# Patient Record
Sex: Female | Born: 1960 | Race: White | Hispanic: No | Marital: Married | Smoking: Never smoker
Health system: Southern US, Community
[De-identification: ages and names within clinical notes are randomized; demographics above are authoritative.]

## PROBLEM LIST (undated history)

## (undated) DIAGNOSIS — R52 Pain, unspecified: Secondary | ICD-10-CM

## (undated) DIAGNOSIS — M4800 Spinal stenosis, site unspecified: Secondary | ICD-10-CM

## (undated) DIAGNOSIS — Z8719 Personal history of other diseases of the digestive system: Secondary | ICD-10-CM

## (undated) DIAGNOSIS — G43909 Migraine, unspecified, not intractable, without status migrainosus: Secondary | ICD-10-CM

## (undated) DIAGNOSIS — F32A Depression, unspecified: Secondary | ICD-10-CM

## (undated) DIAGNOSIS — M797 Fibromyalgia: Secondary | ICD-10-CM

## (undated) DIAGNOSIS — M47812 Spondylosis without myelopathy or radiculopathy, cervical region: Secondary | ICD-10-CM

## (undated) DIAGNOSIS — K760 Fatty (change of) liver, not elsewhere classified: Secondary | ICD-10-CM

## (undated) DIAGNOSIS — F419 Anxiety disorder, unspecified: Secondary | ICD-10-CM

## (undated) DIAGNOSIS — K219 Gastro-esophageal reflux disease without esophagitis: Secondary | ICD-10-CM

## (undated) DIAGNOSIS — K2270 Barrett's esophagus without dysplasia: Secondary | ICD-10-CM

## (undated) DIAGNOSIS — E7800 Pure hypercholesterolemia, unspecified: Secondary | ICD-10-CM

## (undated) DIAGNOSIS — N63 Unspecified lump in unspecified breast: Secondary | ICD-10-CM

## (undated) DIAGNOSIS — R42 Dizziness and giddiness: Secondary | ICD-10-CM

## (undated) DIAGNOSIS — F909 Attention-deficit hyperactivity disorder, unspecified type: Secondary | ICD-10-CM

## (undated) DIAGNOSIS — E079 Disorder of thyroid, unspecified: Secondary | ICD-10-CM

## (undated) DIAGNOSIS — D649 Anemia, unspecified: Secondary | ICD-10-CM

## (undated) DIAGNOSIS — T7840XA Allergy, unspecified, initial encounter: Secondary | ICD-10-CM

## (undated) DIAGNOSIS — K449 Diaphragmatic hernia without obstruction or gangrene: Secondary | ICD-10-CM

## (undated) DIAGNOSIS — Z87440 Personal history of urinary (tract) infections: Secondary | ICD-10-CM

## (undated) DIAGNOSIS — F410 Panic disorder [episodic paroxysmal anxiety] without agoraphobia: Secondary | ICD-10-CM

## (undated) DIAGNOSIS — R51 Headache: Secondary | ICD-10-CM

## (undated) DIAGNOSIS — K52832 Lymphocytic colitis: Secondary | ICD-10-CM

## (undated) DIAGNOSIS — R748 Abnormal levels of other serum enzymes: Secondary | ICD-10-CM

## (undated) DIAGNOSIS — F329 Major depressive disorder, single episode, unspecified: Secondary | ICD-10-CM

## (undated) HISTORY — DX: Anemia, unspecified: D64.9

## (undated) HISTORY — DX: Personal history of urinary (tract) infections: Z87.440

## (undated) HISTORY — DX: Anxiety disorder, unspecified: F41.9

## (undated) HISTORY — DX: Lymphocytic colitis: K52.832

## (undated) HISTORY — DX: Unspecified lump in unspecified breast: N63.0

## (undated) HISTORY — DX: Major depressive disorder, single episode, unspecified: F32.9

## (undated) HISTORY — DX: Pure hypercholesterolemia, unspecified: E78.00

## (undated) HISTORY — DX: Fatty (change of) liver, not elsewhere classified: K76.0

## (undated) HISTORY — PX: CHOLECYSTECTOMY: SHX55

## (undated) HISTORY — DX: Attention-deficit hyperactivity disorder, unspecified type: F90.9

## (undated) HISTORY — DX: Migraine, unspecified, not intractable, without status migrainosus: G43.909

## (undated) HISTORY — DX: Allergy, unspecified, initial encounter: T78.40XA

## (undated) HISTORY — DX: Gastro-esophageal reflux disease without esophagitis: K21.9

## (undated) HISTORY — PX: ANKLE SURGERY: SHX546

## (undated) HISTORY — DX: Disorder of thyroid, unspecified: E07.9

## (undated) HISTORY — DX: Abnormal levels of other serum enzymes: R74.8

## (undated) HISTORY — PX: TUBAL LIGATION: SHX77

## (undated) HISTORY — DX: Pain, unspecified: R52

## (undated) HISTORY — DX: Depression, unspecified: F32.A

## (undated) HISTORY — DX: Dizziness and giddiness: R42

---

## 1999-03-02 ENCOUNTER — Other Ambulatory Visit: Admission: RE | Admit: 1999-03-02 | Discharge: 1999-03-02 | Payer: Self-pay | Admitting: Family Medicine

## 1999-06-03 ENCOUNTER — Encounter: Admission: RE | Admit: 1999-06-03 | Discharge: 1999-06-03 | Payer: Self-pay | Admitting: Family Medicine

## 1999-06-03 ENCOUNTER — Encounter: Payer: Self-pay | Admitting: Family Medicine

## 1999-07-27 ENCOUNTER — Emergency Department (HOSPITAL_COMMUNITY): Admission: EM | Admit: 1999-07-27 | Discharge: 1999-07-28 | Payer: Self-pay | Admitting: Emergency Medicine

## 1999-07-28 ENCOUNTER — Encounter: Payer: Self-pay | Admitting: Emergency Medicine

## 2000-04-09 ENCOUNTER — Encounter: Admission: RE | Admit: 2000-04-09 | Discharge: 2000-04-09 | Payer: Self-pay | Admitting: Family Medicine

## 2000-04-09 ENCOUNTER — Encounter: Payer: Self-pay | Admitting: Family Medicine

## 2000-04-27 ENCOUNTER — Encounter: Payer: Self-pay | Admitting: Emergency Medicine

## 2000-04-27 ENCOUNTER — Emergency Department (HOSPITAL_COMMUNITY): Admission: EM | Admit: 2000-04-27 | Discharge: 2000-04-27 | Payer: Self-pay | Admitting: Emergency Medicine

## 2000-05-02 ENCOUNTER — Emergency Department (HOSPITAL_COMMUNITY): Admission: EM | Admit: 2000-05-02 | Discharge: 2000-05-02 | Payer: Self-pay | Admitting: Emergency Medicine

## 2000-05-02 ENCOUNTER — Encounter: Payer: Self-pay | Admitting: Emergency Medicine

## 2000-05-30 ENCOUNTER — Encounter: Admission: RE | Admit: 2000-05-30 | Discharge: 2000-06-27 | Payer: Self-pay | Admitting: Neurology

## 2000-07-16 ENCOUNTER — Other Ambulatory Visit: Admission: RE | Admit: 2000-07-16 | Discharge: 2000-07-16 | Payer: Self-pay | Admitting: Gynecology

## 2001-03-04 ENCOUNTER — Encounter (INDEPENDENT_AMBULATORY_CARE_PROVIDER_SITE_OTHER): Payer: Self-pay | Admitting: Specialist

## 2001-03-04 ENCOUNTER — Inpatient Hospital Stay (HOSPITAL_COMMUNITY): Admission: RE | Admit: 2001-03-04 | Discharge: 2001-03-06 | Payer: Self-pay | Admitting: Gynecology

## 2001-03-04 HISTORY — PX: VAGINAL HYSTERECTOMY: SUR661

## 2002-02-20 ENCOUNTER — Ambulatory Visit (HOSPITAL_BASED_OUTPATIENT_CLINIC_OR_DEPARTMENT_OTHER): Admission: RE | Admit: 2002-02-20 | Discharge: 2002-02-20 | Payer: Self-pay | Admitting: Orthopedic Surgery

## 2002-04-10 ENCOUNTER — Emergency Department (HOSPITAL_COMMUNITY): Admission: EM | Admit: 2002-04-10 | Discharge: 2002-04-10 | Payer: Self-pay | Admitting: Emergency Medicine

## 2002-05-06 ENCOUNTER — Encounter: Admission: RE | Admit: 2002-05-06 | Discharge: 2002-05-06 | Payer: Self-pay | Admitting: Family Medicine

## 2002-05-06 ENCOUNTER — Encounter: Payer: Self-pay | Admitting: Family Medicine

## 2004-02-15 ENCOUNTER — Inpatient Hospital Stay (HOSPITAL_COMMUNITY): Admission: EM | Admit: 2004-02-15 | Discharge: 2004-02-16 | Payer: Self-pay | Admitting: Internal Medicine

## 2004-02-16 ENCOUNTER — Ambulatory Visit: Payer: Self-pay | Admitting: Psychiatry

## 2004-02-16 ENCOUNTER — Inpatient Hospital Stay (HOSPITAL_COMMUNITY): Admission: RE | Admit: 2004-02-16 | Discharge: 2004-02-21 | Payer: Self-pay | Admitting: Psychiatry

## 2004-04-11 ENCOUNTER — Encounter
Admission: RE | Admit: 2004-04-11 | Discharge: 2004-07-06 | Payer: Self-pay | Admitting: Physical Medicine & Rehabilitation

## 2004-05-10 ENCOUNTER — Ambulatory Visit: Payer: Self-pay | Admitting: Physical Medicine & Rehabilitation

## 2004-05-13 ENCOUNTER — Encounter
Admission: RE | Admit: 2004-05-13 | Discharge: 2004-07-06 | Payer: Self-pay | Admitting: Physical Medicine & Rehabilitation

## 2004-06-28 ENCOUNTER — Ambulatory Visit: Payer: Self-pay | Admitting: Physical Medicine & Rehabilitation

## 2004-07-07 ENCOUNTER — Ambulatory Visit (HOSPITAL_COMMUNITY)
Admission: RE | Admit: 2004-07-07 | Discharge: 2004-07-07 | Payer: Self-pay | Admitting: Physical Medicine & Rehabilitation

## 2004-07-21 ENCOUNTER — Encounter
Admission: RE | Admit: 2004-07-21 | Discharge: 2004-10-19 | Payer: Self-pay | Admitting: Physical Medicine & Rehabilitation

## 2004-07-26 ENCOUNTER — Encounter: Admission: RE | Admit: 2004-07-26 | Discharge: 2004-08-11 | Payer: Self-pay | Admitting: Orthopedic Surgery

## 2004-09-17 ENCOUNTER — Emergency Department (HOSPITAL_COMMUNITY): Admission: EM | Admit: 2004-09-17 | Discharge: 2004-09-17 | Payer: Self-pay | Admitting: Emergency Medicine

## 2004-11-10 ENCOUNTER — Ambulatory Visit: Payer: Self-pay | Admitting: Physical Medicine & Rehabilitation

## 2004-11-10 ENCOUNTER — Encounter
Admission: RE | Admit: 2004-11-10 | Discharge: 2005-02-08 | Payer: Self-pay | Admitting: Physical Medicine & Rehabilitation

## 2004-11-17 ENCOUNTER — Encounter
Admission: RE | Admit: 2004-11-17 | Discharge: 2004-12-09 | Payer: Self-pay | Admitting: Physical Medicine & Rehabilitation

## 2005-02-13 ENCOUNTER — Ambulatory Visit: Payer: Self-pay | Admitting: Family Medicine

## 2005-03-01 ENCOUNTER — Ambulatory Visit (HOSPITAL_COMMUNITY): Admission: RE | Admit: 2005-03-01 | Discharge: 2005-03-01 | Payer: Self-pay | Admitting: Family Medicine

## 2005-03-07 ENCOUNTER — Ambulatory Visit: Payer: Self-pay | Admitting: *Deleted

## 2005-03-13 ENCOUNTER — Ambulatory Visit: Payer: Self-pay | Admitting: Family Medicine

## 2006-02-21 ENCOUNTER — Emergency Department (HOSPITAL_COMMUNITY): Admission: EM | Admit: 2006-02-21 | Discharge: 2006-02-22 | Payer: Self-pay | Admitting: Emergency Medicine

## 2006-02-24 ENCOUNTER — Emergency Department (HOSPITAL_COMMUNITY): Admission: EM | Admit: 2006-02-24 | Discharge: 2006-02-24 | Payer: Self-pay | Admitting: *Deleted

## 2007-07-06 ENCOUNTER — Emergency Department (HOSPITAL_COMMUNITY): Admission: EM | Admit: 2007-07-06 | Discharge: 2007-07-06 | Payer: Self-pay | Admitting: Emergency Medicine

## 2007-07-07 ENCOUNTER — Inpatient Hospital Stay (HOSPITAL_COMMUNITY): Admission: EM | Admit: 2007-07-07 | Discharge: 2007-07-11 | Payer: Self-pay | Admitting: Emergency Medicine

## 2007-11-01 ENCOUNTER — Encounter: Admission: RE | Admit: 2007-11-01 | Discharge: 2007-11-01 | Payer: Self-pay | Admitting: Gastroenterology

## 2007-11-04 ENCOUNTER — Ambulatory Visit (HOSPITAL_COMMUNITY): Admission: RE | Admit: 2007-11-04 | Discharge: 2007-11-04 | Payer: Self-pay | Admitting: Gastroenterology

## 2008-01-15 ENCOUNTER — Ambulatory Visit (HOSPITAL_COMMUNITY): Admission: RE | Admit: 2008-01-15 | Discharge: 2008-01-15 | Payer: Self-pay | Admitting: Surgery

## 2008-01-17 ENCOUNTER — Encounter: Admission: RE | Admit: 2008-01-17 | Discharge: 2008-01-17 | Payer: Self-pay | Admitting: Gastroenterology

## 2008-03-05 ENCOUNTER — Ambulatory Visit (HOSPITAL_COMMUNITY): Admission: RE | Admit: 2008-03-05 | Discharge: 2008-03-08 | Payer: Self-pay | Admitting: Surgery

## 2008-03-05 HISTORY — PX: LAPAROSCOPIC NISSEN FUNDOPLICATION: SHX1932

## 2008-03-05 HISTORY — PX: LAPAROSCOPIC PARAESOPHAGEAL HERNIA REPAIR: SHX6307

## 2008-04-24 HISTORY — PX: COLONOSCOPY: SHX174

## 2008-10-07 ENCOUNTER — Other Ambulatory Visit: Admission: RE | Admit: 2008-10-07 | Discharge: 2008-10-07 | Payer: Self-pay | Admitting: Family Medicine

## 2008-10-14 ENCOUNTER — Encounter: Admission: RE | Admit: 2008-10-14 | Discharge: 2008-10-14 | Payer: Self-pay | Admitting: Family Medicine

## 2008-10-27 ENCOUNTER — Encounter: Admission: RE | Admit: 2008-10-27 | Discharge: 2008-10-27 | Payer: Self-pay | Admitting: Family Medicine

## 2008-11-04 ENCOUNTER — Encounter: Admission: RE | Admit: 2008-11-04 | Discharge: 2008-11-04 | Payer: Self-pay | Admitting: Family Medicine

## 2008-12-25 ENCOUNTER — Emergency Department (HOSPITAL_COMMUNITY): Admission: EM | Admit: 2008-12-25 | Discharge: 2008-12-25 | Payer: Self-pay | Admitting: Emergency Medicine

## 2009-01-22 LAB — HM COLONOSCOPY

## 2009-07-16 ENCOUNTER — Emergency Department (HOSPITAL_COMMUNITY): Admission: EM | Admit: 2009-07-16 | Discharge: 2009-07-16 | Payer: Self-pay | Admitting: Emergency Medicine

## 2009-09-29 ENCOUNTER — Encounter (INDEPENDENT_AMBULATORY_CARE_PROVIDER_SITE_OTHER): Payer: Self-pay | Admitting: Internal Medicine

## 2009-09-29 ENCOUNTER — Observation Stay (HOSPITAL_COMMUNITY): Admission: EM | Admit: 2009-09-29 | Discharge: 2009-09-30 | Payer: Self-pay | Admitting: Emergency Medicine

## 2009-09-29 ENCOUNTER — Ambulatory Visit: Payer: Self-pay | Admitting: Surgery

## 2009-09-30 ENCOUNTER — Ambulatory Visit: Payer: Self-pay | Admitting: Surgery

## 2009-09-30 ENCOUNTER — Encounter (INDEPENDENT_AMBULATORY_CARE_PROVIDER_SITE_OTHER): Payer: Self-pay | Admitting: Internal Medicine

## 2009-12-11 ENCOUNTER — Other Ambulatory Visit: Payer: Self-pay | Admitting: Emergency Medicine

## 2009-12-12 ENCOUNTER — Inpatient Hospital Stay (HOSPITAL_COMMUNITY): Admission: RE | Admit: 2009-12-12 | Discharge: 2009-12-17 | Payer: Self-pay | Admitting: Psychiatry

## 2009-12-12 ENCOUNTER — Ambulatory Visit: Payer: Self-pay | Admitting: Psychiatry

## 2010-04-13 ENCOUNTER — Ambulatory Visit (HOSPITAL_COMMUNITY)
Admission: RE | Admit: 2010-04-13 | Discharge: 2010-04-13 | Payer: Self-pay | Source: Home / Self Care | Attending: Family Medicine | Admitting: Family Medicine

## 2010-05-15 ENCOUNTER — Encounter: Payer: Self-pay | Admitting: Family Medicine

## 2010-05-16 ENCOUNTER — Encounter: Payer: Self-pay | Admitting: Family Medicine

## 2010-06-27 ENCOUNTER — Emergency Department (HOSPITAL_COMMUNITY)
Admission: EM | Admit: 2010-06-27 | Discharge: 2010-06-28 | Disposition: A | Discharge status: Home / Self Care | Payer: Medicare Other | Attending: Emergency Medicine | Admitting: Emergency Medicine

## 2010-06-27 DIAGNOSIS — R110 Nausea: Secondary | ICD-10-CM | POA: Insufficient documentation

## 2010-06-27 DIAGNOSIS — R197 Diarrhea, unspecified: Secondary | ICD-10-CM | POA: Insufficient documentation

## 2010-06-27 DIAGNOSIS — Z9089 Acquired absence of other organs: Secondary | ICD-10-CM | POA: Insufficient documentation

## 2010-06-27 DIAGNOSIS — K7689 Other specified diseases of liver: Secondary | ICD-10-CM | POA: Insufficient documentation

## 2010-06-27 DIAGNOSIS — R109 Unspecified abdominal pain: Secondary | ICD-10-CM | POA: Insufficient documentation

## 2010-06-27 LAB — POCT I-STAT, CHEM 8
BUN: 11 mg/dL (ref 6–23)
Calcium, Ion: 1.15 mmol/L (ref 1.12–1.32)
Chloride: 110 mEq/L (ref 96–112)
HCT: 42 % (ref 36.0–46.0)
Potassium: 3.6 mEq/L (ref 3.5–5.1)
Sodium: 140 mEq/L (ref 135–145)

## 2010-06-28 ENCOUNTER — Encounter (HOSPITAL_COMMUNITY): Payer: Self-pay

## 2010-06-28 ENCOUNTER — Emergency Department (HOSPITAL_COMMUNITY): Payer: Medicare Other

## 2010-06-28 LAB — DIFFERENTIAL
Basophils Relative: 0 % (ref 0–1)
Eosinophils Relative: 1 % (ref 0–5)
Monocytes Relative: 10 % (ref 3–12)
Neutrophils Relative %: 63 % (ref 43–77)

## 2010-06-28 LAB — CBC
HCT: 42.5 % (ref 36.0–46.0)
MCH: 31.2 pg (ref 26.0–34.0)
MCV: 92 fL (ref 78.0–100.0)
RBC: 4.62 MIL/uL (ref 3.87–5.11)
WBC: 10.4 10*3/uL (ref 4.0–10.5)

## 2010-06-28 LAB — URINALYSIS, ROUTINE W REFLEX MICROSCOPIC
Glucose, UA: NEGATIVE mg/dL
Ketones, ur: NEGATIVE mg/dL
Leukocytes, UA: NEGATIVE
Nitrite: POSITIVE — AB
Protein, ur: NEGATIVE mg/dL
pH: 5 (ref 5.0–8.0)

## 2010-06-28 LAB — URINE MICROSCOPIC-ADD ON

## 2010-06-28 MED ORDER — IOHEXOL 300 MG/ML  SOLN
100.0000 mL | Freq: Once | INTRAMUSCULAR | Status: AC | PRN
  Administered 2010-06-28: 100 mL via INTRAVENOUS

## 2010-07-07 LAB — DIFFERENTIAL
Basophils Absolute: 0 K/uL (ref 0.0–0.1)
Basophils Relative: 1 % (ref 0–1)
Eosinophils Absolute: 0.1 K/uL (ref 0.0–0.7)
Eosinophils Relative: 2 % (ref 0–5)
Lymphocytes Relative: 28 % (ref 12–46)
Lymphs Abs: 2 K/uL (ref 0.7–4.0)
Monocytes Absolute: 0.5 K/uL (ref 0.1–1.0)
Monocytes Relative: 8 % (ref 3–12)
Neutro Abs: 4.4 K/uL (ref 1.7–7.7)
Neutrophils Relative %: 62 % (ref 43–77)

## 2010-07-07 LAB — COMPREHENSIVE METABOLIC PANEL
Albumin: 3.7 g/dL (ref 3.5–5.2)
Alkaline Phosphatase: 80 U/L (ref 39–117)
BUN: 9 mg/dL (ref 6–23)
CO2: 26 mEq/L (ref 19–32)
Chloride: 104 mEq/L (ref 96–112)
Potassium: 4.5 mEq/L (ref 3.5–5.1)
Total Bilirubin: 0.6 mg/dL (ref 0.3–1.2)

## 2010-07-07 LAB — CBC
HCT: 39.6 % (ref 36.0–46.0)
Hemoglobin: 13.8 g/dL (ref 12.0–15.0)
MCV: 93 fL (ref 78.0–100.0)
Platelets: 198 10*3/uL (ref 150–400)
RBC: 4.26 MIL/uL (ref 3.87–5.11)
WBC: 7 10*3/uL (ref 4.0–10.5)

## 2010-07-07 LAB — POCT PREGNANCY, URINE: Preg Test, Ur: NEGATIVE

## 2010-07-07 LAB — URINALYSIS, ROUTINE W REFLEX MICROSCOPIC
Bilirubin Urine: NEGATIVE
Glucose, UA: NEGATIVE mg/dL
Hgb urine dipstick: NEGATIVE
Ketones, ur: NEGATIVE mg/dL
pH: 6.5 (ref 5.0–8.0)

## 2010-07-07 LAB — RAPID URINE DRUG SCREEN, HOSP PERFORMED
Barbiturates: NOT DETECTED
Benzodiazepines: POSITIVE — AB
Cocaine: NOT DETECTED
Opiates: POSITIVE — AB

## 2010-07-07 LAB — GLUCOSE, CAPILLARY

## 2010-07-07 LAB — TSH: TSH: 2.125 u[IU]/mL (ref 0.350–4.500)

## 2010-07-11 LAB — POCT I-STAT, CHEM 8
Creatinine, Ser: 0.9 mg/dL (ref 0.4–1.2)
HCT: 40 % (ref 36.0–46.0)
Hemoglobin: 13.6 g/dL (ref 12.0–15.0)
Potassium: 3.8 mEq/L (ref 3.5–5.1)
Sodium: 138 mEq/L (ref 135–145)
TCO2: 24 mmol/L (ref 0–100)

## 2010-07-11 LAB — CBC
HCT: 36.9 % (ref 36.0–46.0)
HCT: 38.6 % (ref 36.0–46.0)
Hemoglobin: 12.5 g/dL (ref 12.0–15.0)
Hemoglobin: 12.9 g/dL (ref 12.0–15.0)
MCHC: 33.4 g/dL (ref 30.0–36.0)
MCHC: 33.8 g/dL (ref 30.0–36.0)
MCV: 95.6 fL (ref 78.0–100.0)
RBC: 3.86 MIL/uL — ABNORMAL LOW (ref 3.87–5.11)
RDW: 13.8 % (ref 11.5–15.5)
RDW: 14.1 % (ref 11.5–15.5)

## 2010-07-11 LAB — POCT CARDIAC MARKERS
CKMB, poc: 5.2 ng/mL (ref 1.0–8.0)
Myoglobin, poc: >500 ng/mL (ref 12–200)

## 2010-07-11 LAB — DIFFERENTIAL
Basophils Absolute: 0 10*3/uL (ref 0.0–0.1)
Basophils Absolute: 0.2 10*3/uL — ABNORMAL HIGH (ref 0.0–0.1)
Eosinophils Absolute: 0.2 10*3/uL (ref 0.0–0.7)
Eosinophils Relative: 3 % (ref 0–5)
Lymphocytes Relative: 24 % (ref 12–46)
Lymphocytes Relative: 45 % (ref 12–46)
Lymphs Abs: 2.6 10*3/uL (ref 0.7–4.0)
Monocytes Absolute: 0.6 10*3/uL (ref 0.1–1.0)
Neutro Abs: 7.7 10*3/uL (ref 1.7–7.7)
Neutrophils Relative %: 41 % — ABNORMAL LOW (ref 43–77)
Neutrophils Relative %: 69 % (ref 43–77)

## 2010-07-11 LAB — URINE CULTURE

## 2010-07-11 LAB — COMPREHENSIVE METABOLIC PANEL
BUN: 7 mg/dL (ref 6–23)
CO2: 19 mEq/L (ref 19–32)
Calcium: 8.3 mg/dL — ABNORMAL LOW (ref 8.4–10.5)
Chloride: 115 mEq/L — ABNORMAL HIGH (ref 96–112)
Creatinine, Ser: 0.81 mg/dL (ref 0.4–1.2)
GFR calc non Af Amer: >60 mL/min (ref >60)
Glucose, Bld: 96 mg/dL (ref 70–99)
Total Bilirubin: 0.7 mg/dL (ref 0.3–1.2)

## 2010-07-11 LAB — LIPID PANEL
Cholesterol: 150 mg/dL (ref 0–200)
HDL: 57 mg/dL (ref >39)
Total CHOL/HDL Ratio: 2.6 RATIO

## 2010-07-11 LAB — URINE MICROSCOPIC-ADD ON

## 2010-07-11 LAB — HEMOGLOBIN A1C
Hgb A1c MFr Bld: 5.4 % (ref <5.7)
Mean Plasma Glucose: 108 mg/dL (ref <117)

## 2010-07-11 LAB — PHOSPHORUS: Phosphorus: 3.1 mg/dL (ref 2.3–4.6)

## 2010-07-11 LAB — URINALYSIS, ROUTINE W REFLEX MICROSCOPIC
Bilirubin Urine: NEGATIVE
Hgb urine dipstick: NEGATIVE
Specific Gravity, Urine: 1.009 (ref 1.005–1.030)
pH: 7 (ref 5.0–8.0)

## 2010-07-11 LAB — PROTIME-INR: INR: 1.05 (ref 0.00–1.49)

## 2010-07-11 LAB — CK TOTAL AND CKMB (NOT AT ARMC): CK, MB: 7 ng/mL (ref 0.3–4.0)

## 2010-09-06 NOTE — Discharge Summary (Signed)
Leslie Benson, Leslie Benson            ACCOUNT NO.:  [id number]   MEDICAL RECORD NO.:  [id number]          PATIENT TYPE:  INP   LOCATION:  1515                         FACILITY:  [facility]   PHYSICIAN:  Hollice Espy, M.D.DATE OF BIRTH:  [birth year]   DATE OF ADMISSION:  07/07/2007  DATE OF DISCHARGE:  07/11/2007                               DISCHARGE SUMMARY   PRIMARY CARE PHYSICIAN:  Donia Guiles, M.D.   GI DOCTOR:  Bernette Redbird, M.D.   DISCHARGE DIAGNOSES:  1. Right lower lobe pneumonia, resolving.  2. Viral gastroenteritis.  3. Diarrhea secondary to number #2.  4. Anxiety.  5. Hypokalemia secondary to dehydration and diarrhea, now resolved.   DISCHARGE MEDICATIONS:  The patient will continue all of her previous  medications.  These are as follows:   1. Tramadol 50 mg p.o. p.r.n.  2. Seroquel 600 mg p.o. nightly.  3. Topamax 200 mg p.o. nightly.  4. Zocor 20 p.o. mg nightly.  5. Lyrica 150 mg p.o. t.i.d.  6. Nexium 40 mg p.o. daily.  7. Cymbalta 60 mg p.o. daily.  8. Xanax three times a day as needed.   New medications:  1. Avelox 400 mg p.o. x3 days.  2. Imodium 2 mg p.o. q.8 h. x3 days, then p.r.n.   HOSPITAL COURSE:  1. The patient is a 49 year old white female with a past medical      history of anxiety and depression who presented with several days      of weakness, diarrhea and shortness of breath.  In regards to her      pneumonia, she was found to have an elevated white count and an x-      ray showing interstitial patchy airways disease consistent with      pneumonia.  The patient was started on IV antibiotics, nebulizers      and oxygen and over the next several days showed improvement.  Her      chest x-ray on July 11, 2007, showed improvement but not yet      complete resolution of pneumonia.  Plan will be for the patient to      continue on Avelox x5 more days.  She will also be discharged on an      inspirometer to help increase lung capacity  function and further      resolution of pneumonia.  2. In regards to her anxiety, initially she did not list Xanax as one      of her p.r.n. medications and had a episodes of anxiety.  After      having her treated with p.r.n. Xanax, this resolved.  3. In regards to her diarrhea, stool studies were sent which were      negative for C difficile.  She was treated p.r.n. Imodium and had      resolution of this issue.  Plan will be continue her on t.i.d.      Imodium scheduled and until she has completed her antibiotics in      case some of this may be a side effect secondary to her      antibiotics.  4. In regard to her other medical issues including depression and      fibromyalgia, these were stable.  She was continued on her      medications.   OVERALL DISPOSITION:  Improved.   ACTIVITY:  Slow to increase.   DISCHARGE DIET:  A regular diet.   She will follow up with PCP, Dr. Donia Guiles, in the next 1-2 weeks,  and she is being discharged home.      Hollice Espy, M.D.  Electronically Signed     SKK/MEDQ  D:  07/11/2007  T:  07/12/2007  Job:  [id number]   cc:   Donia Guiles, M.D.  Fax: [phone]   Bernette Redbird, M.D.  Fax: [phone]

## 2010-09-06 NOTE — Op Note (Signed)
Leslie Benson, Leslie Benson            ACCOUNT NO.:  [id number]   MEDICAL RECORD NO.:  [id number]          PATIENT TYPE:  AMB   LOCATION:  ENDO                         FACILITY:  [facility]   PHYSICIAN:  Bernette Redbird, M.D.   DATE OF BIRTH:  Dec 02, 1960   DATE OF PROCEDURE:  11/04/2007  DATE OF DISCHARGE:  11/04/2007                               OPERATIVE REPORT   PROCEDURE:  Esophageal manometry.   INDICATION:  A 50 year old female with severe reflux and regurgitation  symptoms, being considered for possible antireflux surgery.   FINDINGS:  Normal exam, except slightly diminished lower esophageal  sphincter resting tone   DESCRIPTION OF PROCEDURE:  The patient provided written consent for the  procedure after coming as an outpatient to the [facility]  [facility].  The solid-state manometry catheter was passed transnasally, and  LES study was performed, and then wet swallows were administered for the  esophageal body and upper esophageal sphincter studies.   FINDINGS:  1. LES, lower esophageal sphincter had slightly reduced resting tone      of 8 mm (normal 10-45) with 90% relaxation in association with wet      swallows.  2. Esophageal body and esophageal contractions were normal in      amplitude and duration, with amplitudes in the 34-63 range      proximally and 59 mmHg distally.  There were 8% non-transmitted      contractions and 92% peristaltic contractions without obvious      spontaneous repetitive or aperistaltic activity  3. UES, the upper esophageal sphincter had high - normal resting      pressure and it relaxed normally in association with pharyngeal      contractions.   IMPRESSION:  1. Slightly diminished lower esophageal sphincter resting tone.  2. Otherwise normal esophageal manometry study without manometric      contraindication to contemplated antireflux surgery.   PLAN:  Follow-up per Dr. Michaell Cowing.           ______________________________  Bernette Redbird,  M.D.     RB/MEDQ  D:  11/25/2007  T:  11/25/2007  Job:  [id number]   cc:   Ardeth Sportsman, MD  [address] [place] [address]

## 2010-09-06 NOTE — Op Note (Signed)
NAMENEMESIS, RAINWATER NO.:  [id number]   MEDICAL RECORD NO.:  [id number]          PATIENT TYPE:  INP   LOCATION:  0001                         FACILITY:  [facility]   PHYSICIAN:  Ardeth Sportsman, MD     DATE OF BIRTH:  1961/03/04   DATE OF PROCEDURE:  03/05/2008  DATE OF DISCHARGE:                               OPERATIVE REPORT   PRIMARY CARE PHYSICIAN:  Donia Guiles, M.D.   GASTROENTEROLOGIST:  Bernette Redbird, M.D.   SURGEON:  Ardeth Sportsman, M.D.   ASSISTANT:  Velora Heckler, M.D.   PREOPERATIVE DIAGNOSES:  1. Sliding paraesophageal hiatal hernia.  2. Gastroesophageal reflux disease, with Barrett's esophagus,      secondary to above.  3. Moderate delayed gastric emptying.   POSTOPERATIVE DIAGNOSES:  1. Sliding paraesophageal hiatal hernia.  2. Gastroesophageal reflux disease, secondary to above.  3. Moderate delayed gastric emptying.   PROCEDURE PERFORMED:  1. Laparoscopic type 2 mediastinal dissection.  2. Paraesophageal hernia repair  (Primary closure with pledgets and      underlay reinforcement repair using biologic Strattice 8 cm  x 16      cm mesh).  3. Laparoscopic Nissen fundoplication over a 56-French bougie.   ANESTHESIA:  1. General anesthesia.  2. Local anesthetic in a field block for all port sites.   SPECIMENS:  None.   DRAINS:  A 19-French Blake drain goes along the lesser curvature into  the anterior and posterior mediastinum.   ESTIMATED BLOOD LOSS:  100 mL.   COMPLICATIONS:  No major complications.   INDICATIONS FOR PROCEDURE:  Ms. Carrender is a 50 year old obese female  who has had worsening symptoms of gastroesophageal reflux disease with  known Barrett's esophagus and sliding hiatal hernia, refractory to  medical management.  A surgical consultation was requested by her  gastroenterologist, Dr. Matthias Hughs.  Manometry revealed some mild  dysmotility but certainly no evidence of severe dysmotility or  achalasia.  She had a  gastric emptying study which showed some moderate  delayed gastric emptying but not severely delayed greater than 120  minutes.  The options were discussed and recommendations made for a  laparoscopic paraesophageal hernia repair with Nissen fundoplication.  Given her young age and obesity, I recommended a mesh reinforcement to  help decrease her recurrence rate.  The risks, benefits and alternatives  were discussed.  Questions were answered.  She agreed to proceed.   OPERATIVE FINDINGS:  She had a moderate-sized paraesophageal hernia,  sliding with an 8 cm x 6 cm defect.  There is no other significant  abnormality.   DESCRIPTION OF PROCEDURE:  An informed consent was confirmed.  The  patient received IV cefazolin just prior to surgery.  She had sequential  compression devices active during the entire case.  She received 10 mg  of IV Decadron for nausea and swelling and pulmonary control.  She  underwent general anesthesia without any difficulty.  She was placed on  the split table, with the arms tucked with a foot rest and anterior  chest restraint.  She had a Foley catheter sterilely placed.  Her  abdomen  was prepped and draped in a sterile fashion.   A 10 mm port was placed in the left upper quadrant in the paramedian  region halfway between the costal ridge and the umbilicus, without any  difficulty, with the patient in the steep reverse Trendelenburg and left  side up, using optical entry technique.  Camera inspection revealed no  intra-abdominal injury.  Under direct visualization the anterior port  was placed in the costal region along the anterior axillary line.  A 5  mm port was placed in the left mid-abdomen in the Mirage image fashion  from the camera port.  A final port was placed in just left of the high  subxiphoid region, removed and an Omega-shaped fixed liver retractor was  used to help lift the left lateral sector of the liver anteriorly.  She  had a short left sector  but it was rather thick and floppy.  Another 5  mm port was placed in the right subxiphoid region.  The operative  findings are as noted above with the sliding paraesophageal hiatal  hernia.   I opened up the hepatogastric ligament along the lesser curvature and  followed it up until the right crus could be seen.  A window was made  between the right crus and the esophagus at the right crural apex.  A  careful dissection was done to free the right esophageal phrenic  attachments, to optimize the medial border of the right crus.  This was  followed down posteriorly until the left crus could be identified.  The  medial aspect of the left crus was freed up from the posterior  mediastinum.  The lateral border of the left crus was identified and  this was followed up as well.   The stomach was rolled over towards the right side and the left anterior  crural attachments were freed off as well.  The short gastrics on the  stomach were ligated from the mid-greater curvature of the stomach all  the way proximally over the cardia.  She had dense short gastrics around  the spleen and this is where most of the blood loss occurred, as there  was a short gastric right on the spleen that was initially not able to  be fully ligated.  Ultimately after two clips on each side, we were able  to control it.  I was able to roll the stomach away and free the  remaining left phrenoesophageal attachments until the phrenoesophageal  region was completely mobilized.   A type 2 mediastinal dissection was done to free the distal thoracic  esophagus and help have good intra-abdominal esophageal length.  There  was a small bleeder from an aortoesophageal branch.  It was isolated and  controlled with harmonic dissection.  The anterior and posterior vagus  nerves were seen and preserved at all times.  With this type 2  mediastinal dissection, I got excellent mobilization.  There did not  seem to be a breech in either  pleural cavity.   The cardia of the stomach was brought posterior to the esophagus, from  left over the right side to set with the right side of the  fundoplication wrap; and, a classic shoeshine maneuver was done.  The  right side of the wrap was grasped and rolled over, to expose the crura.  The crura was reapproximated using #0 Ethibond horizontal mattress  stitches with pledgets x3.  This repair was reinforced using a Surgisis  8 cm x 16 cm mesh  that had a key hole cut into a U-shape, using the  tails going anteriorly and most of the mesh covering up the posterior  crural closure.  The mesh was secured to the crura using #1 Ethibond  interrupted stitches x2 through some pre-cut holes through some small  punch hold biopsies within the mesh itself.  Note that the posterior  crural stitches were also used to take a bite into the posterior wall of  the right-sided wrap for a posterior gastropexy x2.  The tails of the  mesh were secured in all medial and lateral aspects.  Some of the mesh  was allowed to tuck superior to the spleen as well.  The most superior  aspect of the left side of the wrap was secured to the left anterior  crura using a #0 Ethibond stitch to help set up the wrap well.   A 56-French bougie was passed down transorally by anesthesia, with  myself placing tension on the stomach and esophagus, and passed easily  without any difficulty, and no tension.  A 3-stitch Nissen  fundoplication was done, taking a bite on the left side of the wrap and  a bite of the anterior esophagus and the right side of the wrap, tying  down.  It was measured at 2 cm.  The bougie was removed.  There was  enough laxity anteriorly to allow a passer to go between the wrap and  the stomach as well as the anterior crura, and for a rather snug crural  closure and a snug wrap closure.  A 19-French Blake drain was passed  into the anterior mediastinum through the right upper quadrant port site  and  allowed to lay along the lesser curvature before it came out.  It  was secured to the skin with a nylon stitch.  There had been a couple of  scratches on the posterior aspect of the left lateral sector of the  liver with a little bit of oozing, but it was well-controlled.  Hemostasis was excellent at the end.  We re-inspected around the spleen.  Again hemostasis was excellent in that region.  Air was evacuated and  ports were removed at the left lateral sector, after the liver retractor  had been removed under direct visualization.  The skin was closed using  #4-0 nylon stitch.  The 10 mm port site at the left costal ridge had  rolled up above the rib cage and was too small to allow my pinkie to  pass, so I did not do any more aggressive fascial closure.   The patient was extubated and sent to the recovery room in stable  condition.   I discussed postoperative care and treatment with the patient in the  clinic and just prior to surgery and discussed with her husband as well.      Ardeth Sportsman, MD  Electronically Signed     SCG/MEDQ  D:  03/05/2008  T:  03/05/2008  Job:  [id number]   cc:   Bernette Redbird, M.D.  Fax: [phone]   Donia Guiles, M.D.  Fax: [phone]

## 2010-09-06 NOTE — H&P (Signed)
Leslie Benson, Leslie Benson NO.:  [id number]   MEDICAL RECORD NO.:  [id number]          PATIENT TYPE:  INP   LOCATION:  1515                         FACILITY:  [facility]   PHYSICIAN:  Ramiro Harvest, MD    DATE OF BIRTH:  April 30, 1960   DATE OF ADMISSION:  07/07/2007  DATE OF DISCHARGE:                              HISTORY & PHYSICAL   PRIMARY CARE PHYSICIAN:  Dr. Donia Guiles, [facility].   PATIENT GASTROENTEROLOGIST:  Dr. Bernette Redbird.   HISTORY OF PRESENT ILLNESS:  Leslie Benson is a 50 year old white female  with history of fibromyalgia, inflammatory colitis, depression, chronic  pain who presents to the ED with a 2-day history of profuse diarrhea,  fever, chills, nausea, nonproductive cough, palpitations and a one day  history of emesis in the ED.  The patient denies chest pain, no  shortness of breath, unchanged, no blood or mucus in stools.  No melena.  No hematemesis.  No hematochezia.  The patient states that she has been  feeling sick for the past 4 days with generalized weakness, no sick  contacts.  No recent travel, no change in dietary habits.  No other  associated symptoms.  The patient was seen in the ED one day prior to  admission with similar symptoms, hydrated, given antibiotics and sent  home.  The patient states no improvement.  Unable to keep anything down  and came back to the ED.  In the ED, the patient was found to have a  patch interstitial air space opacities on the chest x-ray, hypokalemic,  and we were called to admit the patient for further evaluation and  management.  The patient also states that took dose of Imodium prior to  coming to the hospital.   ALLERGIES:  CODEINE CAUSES NAUSEA AND VOMITING.  COMPAZINE CAUSES  HALLUCINATIONS.  METRONIDAZOLE, VICODIN CAUSES NAUSEA, SULFA CAUSES  NAUSEA.   PAST MEDICAL HISTORY:  1. History of drug overdose.  2. Depression.  3. Anxiety.  4. Fibromyalgia.  5. Chronic back pain.  6.  History of a pelvic pain with dysmenorrhea and menorrhalgia.  7. Hemangiomas of the liver.  8. Status post tubal sterilization.  9. Status post hysterectomy.  10.History of uterine prolapse.  11.Chronic menorrhagia.  12.Adenomyosis of the uterus.  13.History of bilateral trigger thumb status post release and partial      excision of an A1 pulley of the bilateral thumbs, MTP joint per Dr.      Chaney Malling on February 20, 2002.  14.Cervical arthritis.  15.Inflammatory colitis.  16.Post-traumatic stress disorder.  17.Hyperlipidemia.  18.Status post cholecystectomy.  19.Migraine headaches.  20.Gastroesophageal reflux disease.   MEDICATIONS:  1. Nexium 40 mg daily.  2. Seroquel 200 mg t.i.d.  3. Topamax 200 mg daily.  4. Tramadol 50 mg q.i.d. p.r.n.  5. Cymbalta 60 mg b.i.d.  6. Zocor 20 mg daily.  7. Lyrica 150 mg t.i.d.  8. Zofran 4 mg p.o. q.4 h which was started yesterday.   SOCIAL HISTORY:  The patient lives in [place] with her husband and  two daughters.  No tobacco abuse.  No alcohol use.  No IV drug use.   FAMILY HISTORY:  Mother alive age 42, history of panic attacks and  breast cancer.  Father deceased age 57 from acute MI.  Six brothers and  sisters, two brothers and four sisters all whom are healthy.   REVIEW OF SYSTEMS:  Per HPI and also weakness.   PHYSICAL EXAMINATION:  VITAL SIGNS:  Temperature 96.7, blood pressure  120/77, pulse of 96, respiratory rate 18, sating 93% on room air.  GENERAL:  The patient is in mild distress.  HEENT: Normocephalic, atraumatic.  Pupils equal, round and reactive to  light.  Extraocular movements intact.  Oropharynx is dry.  No lesions,  no exudates.  NECK:  Supple.  No lymphadenopathy.  RESPIRATORY:  Lungs are clear to auscultation bilaterally.  CARDIOVASCULAR:  Regular rhythm.  No murmurs, rubs or gallops.  ABDOMEN:  Soft, nondistended.  Positive bowel sounds.  Tender to  palpation, mainly in the epigastric region greater than  the rest of the  abdomen.  No rebound or guarding.  EXTREMITIES:  No clubbing, cyanosis or edema.  NEUROLOGIC:  The patient is alert and oriented x3.  Cranial nerves II-  XII are grossly intact.  No focal deficits.   LABORATORY DATA:  Sodium 139, potassium 3.3, chloride 111, bicarb 18,  BUN 7, glucose 114, creatinine 0.82, calcium of 9.4.  CBC white count  7.5, hemoglobin 10.5, platelets 263, hematocrit 32.0, ANC of 5.8.  Acute  abdominal series shows a patch interstitial and air space opacities of  the lungs, bases right greater than left, question of viral pneumonia or  atypical pneumonia suggest correlation with symptoms nonobstructive  bowel gas pattern cholecystectomy, no stones.   ASSESSMENT AND PLAN:  [name] is a 51 year old female with  history of inflammatory colitis, anxiety, depression, fibromyalgia who  presents with a 2-day history of diarrhea and nausea and vomiting.   PROBLEMS:  1. Probable pneumonia per chest x-ray.  Check a sputum Gram stain and      culture.  Check blood cultures x2.  Check a UA with Gram stain and      culture.  Treat empirically with Rocephin and azithromycin.  Will      place on oxygen, Mucinex and we will follow.  Continue also      symptomatic treatment.  2. Diarrhea, questionable etiology, likely a viral gastroenteritis      versus a colitis.  The patient took Imodium prior to coming to the      ED.  Will check stool studies, hydrate with IV fluids and follow      clinically.  Check a lipase, amylase and symptomatic treatment.  3. Dehydration secondary to problem #1, IV fluids.  4. Depression/anxiety.  Continue home dose Cymbalta and Seroquel.  5. Migraine headaches, Topamax.  6. Fibromyalgia.  Seroquel and Lyrica.  7. Gastroesophageal reflux disease.  Protonix.  8. Hyperlipidemia .  Check a fasting lipid panel.  Place on home dose      Zocor.  9. Post-traumatic stress disorder.  10.Chronic back pain, tramadol.  11.Hypokalemia  secondary to problem #2.  Check a magnesium and      replete.  12.Prophylaxis.  Protonix for GI prophylaxis.  SCDs for DVT      prophylaxis.   It was a pleasure taking care of [name].      Ramiro Harvest, MD  Electronically Signed     DT/MEDQ  D:  07/07/2007  T:  07/08/2007  Job:  [id number]   cc:   Donia Guiles, M.D.  Fax: [phone]

## 2010-09-09 NOTE — Consult Note (Signed)
Leslie Benson, Leslie Benson               ACCOUNT NO.:  [id number]   MEDICAL RECORD NO.:  [id number]          PATIENT TYPE:  IPS   LOCATION:  0306                          FACILITY:  [facility]   PHYSICIAN:  Hollice Espy, M.D.DATE OF BIRTH:  11/12/1960   DATE OF CONSULTATION:  02/17/2004  DATE OF DISCHARGE:                                   CONSULTATION   REASON FOR CONSULTATION:  Back pain with radiation.   HISTORY OF PRESENT ILLNESS:  The patient is a 50 year old white female who  was actually recently on the [facility] service and discharged to  [facility] after she had an overdose of Xanax and Seroquel.  She has  been suffering from a history of depression and apparently has a recent  diagnosis of fibromyalgia, cervical arthritis and chronic back pain.  She  was admitted to [facility] on February 16, 2004.  She states that,  since her hospital stay over at [facility], which I believe was a few days  prior, she has been complaining of this severe back pain.  It is most  described as sharp pain with occasional spasms, starting in her right  buttock and radiating down both legs, both posterior ally and anteriorly.  In addition, she has some severe pain running on both sides of her hips,  down her legs as well.  These have been ongoing and continue in nature and  she feels mostly bedridden and too weak to get out of bed.  She feels very  depressed.  She has been receiving pain medication and anti-inflammatory  medication without relief.  She otherwise denies any other complaints other  than feeling sore all over.  She denies any specific chest pain, shortness  of breath, wheeze, cough or dysphagia.  She complains of a mild headache as  well as otherwise just general malaise.  She denies any focused abdominal  pain, hematuria, dysuria, constipation or diarrhea.   PAST MEDICAL HISTORY:  Depression, recent overdose.   ALLERGIES:  She is allergic to CODEINE, COMPAZINE and  SULFA.   MEDICATIONS:  Here at [facility] include Milk of Magnesium p.r.n.,  antacid p.r.n., Tylenol p.r.n., Protonix 40 mg daily, Seroquel 100 mg daily,  Paxil 50 mg daily, Dulcolax 5 mg suppository p.r.n., Cepacol p.o. p.r.n.,  Ambien 10 mg p.r.n., Xanax 1 mg p.r.n. t.i.d., ibuprofen 600 mg q.6h. p.r.n.  In addition, I believe that she was also recently started on iron  supplements 300 mg t.i.d. as well as Zoloft and a multivitamin as well as  Vicodin.   SOCIAL HISTORY:  The patient denies any alcohol or drug use.  She has not  been working for over a month secondary to her weakness.   FAMILY HISTORY:  Father had an MI at age 57.   PHYSICAL EXAMINATION:  The patient's vitals on admission were blood pressure  103/68, pulse 117, respirations 18, temperature 98.2.  In general, she  appears to be alert and oriented x 3, in some mild generalized distress.  HEENT:  Normocephalic and atraumatic.  She has a slightly flattened affect.  HEART:  Regular rate and rhythm, S1, S2.  LUNGS:  Clear to auscultation bilaterally.  ABDOMEN:  Soft, nontender, nondistended, positive bowel sounds.  EXTREMITIES:  No focal clubbing, cyanosis, or edema.  When attempting to do  a straight leg raise, she has some sudden severe spasms, especially in the  right buttock with a right straight leg and obturator sign and a left  obturator.  She had no trigger point tenderness over the forehead or forearm  but she has noted trigger point and tenderness in the superior aspect of her  right and left buttocks as well as severe in the right and left trochanteric  area.  No palpable fluid is present.  She has also soreness on her legs and  some pain with her extension of her knee.   LABORATORY DATA:  Unremarkable.   ASSESSMENT AND PLAN:  Severe back and hip pain.  I doubt that this is a  bilateral trochanteric bursitis and sciatica.  This is most likely a flare  up of her fibromyalgia.  It was explained to the  patient that she would get  minimal benefit from pain medicines or muscle relaxants but she should not  be frustrated.  After all, many people have fibromyalgia and are able to  lead an active, healthy lifestyle.  Her best benefit would be, I recommend,  tricyclic antidepressant such as amitriptyline.  However, will refer to Dr.  Dub Mikes as the patient is already on Zoloft and Paxil.  In addition, she is  recommended for increased activity and encouragement of this as it will  greatly help her.     Send   SKK/MEDQ  D:  02/17/2004  T:  02/17/2004  Job:  [id number]   cc:   PCP

## 2010-09-09 NOTE — Op Note (Signed)
   Leslie Benson, Leslie Benson                         ACCOUNT NO.:  [id number]   MEDICAL RECORD NO.:  [id number]                   PATIENT TYPE:  AMB   LOCATION:  DSC                                  FACILITY:  [facility]   PHYSICIAN:  Rodney A. Chaney Malling, M.D.           DATE OF BIRTH:  1961-01-03   DATE OF PROCEDURE:  02/20/2002  DATE OF DISCHARGE:                                 OPERATIVE REPORT   PREOPERATIVE DIAGNOSIS:  Right trigger thumb.   POSTOPERATIVE DIAGNOSIS:  Right trigger thumb with cyst on A1 flexor pulley,  right thumb.   PROCEDURE:  Release and partial excision of A1 pulley, right thumb  metacarpophalangeal joint.   SURGEON:  Lenard Galloway. Chaney Malling, M.D.   ANESTHESIA:  General.   DESCRIPTION OF PROCEDURE:  After satisfactory general anesthesia, a  pneumatic tourniquet was placed about the forearm on the right.  The right  upper extremity was prepped with Duraprep and draped out in the usual  manner.  The tourniquet was elevated.  Under loupe magnification a Brunner  zigzag incision made over the level of the volar surface of the thumb MP  joint.  Skin edges were retracted.  Blunt dissection was used and carried  down to the flexor sheath.  A small right angle retractor was used to  protect the nerves.  The flexor sheath could be seen.  There was a  multilobulated cyst on the A1 pulley.  This portion of pulley was totally  excised, including the cyst, then the rest of the sheath was split.  Once  this was accomplished, there was no more impingement on the flexor pollicis  longus tendon.  There was good excursion.  The skin was then closed with a 5-  0 nylon suture and sterile dressings were applied.  The patient was then  returned to the recovery room in excellent condition.  Technically the  procedure went extremely well.   FOLLOW-UP CARE:  To my office next week.                                               Rodney A. Chaney Malling, M.D.    RAM/MEDQ  D:  02/20/2002  T:   02/20/2002  Job:  [id number]

## 2010-09-09 NOTE — Assessment & Plan Note (Signed)
HISTORY:  [name] returns today.  She was last seen by me July 25, 2004.  At that time she was having some increased neck pain but otherwise fairly  stable exam consistent with her fibromyalgia symptoms.  She does have some  hand numbness.  Has been seen by hand surgery, and no surgery was planned at  that time.  EMG did show very mild median neuropathy at the wrist.  She has  not tried injections, which she declines because she does not like  injections and she declines on corticosteroid p.o. because of concerns  regarding weight gain.  Her EMG basically showed borderline motor  prolongation bilaterally, median and right median sensory that was  moderately prolonged distal latency but with normal amplitude, borderline on  the left side.   She has had really no new problems other than some increasing knee pain.  She notes that when she has been sitting for awhile, then gets up, also  notes when she is kneeling on the ground doing some housework.   Her pain is 6/10 in the knees and interfering with general activity at 6/10  level, relationship with other people 5, and enjoying life 5.  She is  sleeping fairly well.  She can walk 20 minutes at a time, can climb steps  and drives.  She is independent with all her self-care but states she needs  some assistance with some of her household duties and shopping, particularly  when she has to do kneeling.  Neuropsych positive for some depression and  anxiety.  Had some suicidal thoughts in the past but none current.  Trouble  walking, tingling, numbness diarrhea.   CURRENT MEDICATIONS:  Xanax, Nexium, Topamax, Cymbalta, Allegra, and Geodon.   SOCIAL HISTORY:  Divorced, lives by herself.   PHYSICAL EXAMINATION:  VITAL SIGNS:  Blood pressure 100/66, pulse 98,  respirations 16, O2 saturation 98% on room air.  GENERAL:  In no acute distress.  Mood and affect appropriate.  MUSCULOSKELETAL/NEUROLOGIC:  Her exam is positive for fibromyalgia  tender  points about her posterior cervical, bilateral upper trap, bilateral  sternocostal, bilateral elbow, bilateral hip.   She has full strength, bilateral upper and lower extremities, normal range  of motion of bilateral upper and lower extremities.  Good back range of  motion, just tenderness at the lumbosacral junction bilaterally.  Her gait  is without abnormalities.   IMPRESSION:  1.  Fibromyalgia syndrome.  2.  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  Symptoms      have not progressed.  3.  Patellar tendinitis.  She had tenderness over the bilateral patellar      tendons.  She has crepitus at the knee.  For that reason, I am sending      her to outpatient therapy.   I will see her back in approximately two months.       AEK/MedQ  D:  11/14/2004 13:40:37  T:  11/14/2004 15:08:30  Job #:  [id number]   cc:   Sadie Haber [facility]   Donia Guiles, M.D.  [address] [place]  [place] [address]  Fax: [phone]   Lynden Ang, M.D.  [address]  [place], [place] [address]  Fax: [phone]   Bernette Redbird, M.D.  [address]  [place], [place] [address]  Fax: [phone]   Artist Pais. Mina Marble, M.D.  [address]  [place]  [place] [address]  Fax: [phone]   Thereasa Distance  Konrad Saha, M.D.  [address] [place]  [place] [address]  Fax: [phone]

## 2010-09-09 NOTE — Discharge Summary (Signed)
[facility]  Patient:    Leslie Benson, Leslie Benson Visit Number: [id number] MRN: [id number]          Service Type: GYN Location: 9300 [id number] Attending Physician:  Susa Raring Dictated by:   Luvenia Redden, M.D. Admit Date:  03/04/2001 Discharge Date: March 13, 2001                             Discharge Summary  HISTORY OF PRESENT ILLNESS:   This patient is a 50 year old para 3 admitted to the hospital for complaints of chronic pelvic pain, dysmenorrhea, and dyspareunia.  She also has a menorrhagia of longtime standing.  Analgesics have been ineffective in controlling her pain.  Oral contraceptives cause severe nausea so she cannot take those to control her flow.  Ponstel was also tried and this did not work either.  She is now admitted for a hysterectomy vaginally to have definitive treatment of this bleeding and pain problem.  PHYSICAL EXAMINATION:         Pertinent findings on physical exam are limited to the pelvis where she has normal external genitalia with a parous outlet, the cervix is smooth and prolapsed nearly to the introitus.  Corpus is anterior, normal size, and is exquisitely tender to palpation and to movement. No adnexal masses can be palpated.  Bladder and rectum are well supported. There are no masses in the pelvis.  LABORATORY DATA:              Preadmission hemogram was normal with a hemoglobin to 11.8, hematocrit 34.9 which was a little on the low side., differential was normal.  ALT was 21 which is normal.  Urine pregnancy test was negative and a urinalysis on a voided specimen was normal.  COURSE IN THE HOSPITAL:       The patient was taken to the operating room having been brought in through day surgery.  A total vaginal hysterectomy was performed without complication.  On the first postoperative day, the patients hemoglobin was 9.6 and she was experiencing some nausea and was not able to take fluids much by mouth.  Later on  that day she was feeling better and was taking some fluids but no solids and she had not passed any flatus.  Her abdomen was slightly distended with retained gas.  On 03-13-01, she had passed some flatus and was taking p.o. fluids and thus was allowed to go home.  She was afebrile.  The patient was instructed to eat a regular diet. She was instructed in limited activity, including but not limited to no heavy lifting or straining, no vaginal penetration, no driving and to return to my office in four weeks time for followup care.  She was given a prescription of Mepergan for pain and instructed to resume her preop medications that she was taking at home.  Examination of the uterus that was removed showed benign changes.  She had some mild chronic cervicitis and she had adenomyosis of the myometrium.  FINAL DIAGNOSES:              1. Chronic pelvic pain, dysmenorrhea, and                                  dyspareunia.  2. Uterine prolapse.                               3. Adenomyosis of the uterus.                               4. Persistent menorrhagia.  CONDITION ON DISCHARGE:       Improved. Dictated by:   Luvenia Redden, M.D. Attending Physician:  Susa Raring DD:  04/03/01 TD:  04/03/01 Job: [id number]

## 2010-09-09 NOTE — H&P (Signed)
Leslie Benson, NEFF NO.:  [id number]   MEDICAL RECORD NO.:  [id number]          PATIENT TYPE:  IPS   LOCATION:  0306                          FACILITY:  [facility]   PHYSICIAN:  [name], M.D.      DATE OF BIRTH:  10-07-1960   DATE OF ADMISSION:  02/16/2004  DATE OF DISCHARGE:                         PSYCHIATRIC ADMISSION ASSESSMENT   IDENTIFYING INFORMATION:  This is a 50 year old, divorced, white female who  is a voluntary admission.   HISTORY OF PRESENT ILLNESS:  This patient presented in the emergency room  after she was found unresponsive at home by her daughter who called EMS.  She was taken to the emergency room and found to have overdosed on  approximately 60 tablets of Xanax 1 mg and an unknown number of Seroquel 100  mg tablets and possibly some Wellbutrin.  She was admitted to the medical  unit on October 23 where she was medically stabilized and subsequently  transferred here for admission on October the 25th.  She endorses that this  is an intentional suicide attempt stating, I just couldn't take it  anymore.  She feared that her ex-husband was going to take the kids away.  He had scheduled her to appear in court with plans to decrease her child  support.  She felt that she could not cope if her children were not around.  She feels that he harasses her and that if he was able to take the children  away from her that she would not be able to cope and have no reason to live.  She did not feel that she could face going to court and risking this.  She  endorses increased depressed mood over the past two to four weeks, panic  attacks that occur several times during the week, but not daily, feelings of  hopelessness, anhedonia for three weeks and constant worry with not having a  job, sleep decreased to three to four hours per night.  She denies homicidal  thought or auditory or visual hallucinations.   PAST PSYCHIATRIC HISTORY:  The patient has been  followed by Dr. [name] for the past couple of years for her anxiety and depression.  This is  her first inpatient psychiatric admission.  She denies any prior suicidal  thoughts or prior suicidal attempts.  She has been treated with various  antidepressants in the past, but is unable to recognize the names of any  specific ones.  She is quite sure she has never taken Zoloft for her  anxiety.  Does not remember ever taking Lexapro.  She is not sure of any  other medications that she may have taken.   SOCIAL HISTORY:  The patient is divorced six years from her fourth husband  and moved here from [place] with their two children, ages 61 and 91, because  he had obtained a court order to get her to move to [place].  She has  not been working for the past month and prior to that was working for a [employer].  She denies any current legal charges.  She denies any history of  substance abuse.   FAMILY HISTORY:  Remarkable for father with a history of myocardial infarct  and no significant family history of depression or mental illness.   MEDICAL HISTORY:  The patient is followed by Dr. [name] in [place],  [place].  Past medical history is remarkable for no history of  seizures, blackouts or memory loss.  She does have a history of surgery of  both hands for cysts in her hands.  She has a history of bursitis of the  right hip, but no prior trauma or fracture.  She has a history of prior  hysterectomy, some ankle surgery and a prior cholecystectomy, but no  hospitalizations for medical purposes.   MEDICAL PROBLEMS:  1.  Fibromyalgia.  2.  Some chronic back pain.  3.  Cervical arthritis.   CURRENT MEDICATIONS:  1.  Paxil CR 50 mg p.o. daily.  2.  Xanax 1 mg.  She is prescribed to take this up to five times a day and      typically uses it between two and three times daily.  3.  Seroquel 100 mg p.o. q.h.s.  4.  She is no particular medications for pain.    DRUG ALLERGIES:  1.  CODEINE, which has caused vomiting.  2.  COMPAZINE, causes hallucinations.  3.  SULFA.  She cannot remember her allergic reaction.   REVIEW OF SYMPTOMS:  She is in no acute distress, but is having moderate to  intermittently severe pain that starts in her right buttock and shoots down  her right leg, lancinating, sharp and electric like flashes of pain when she  bears weight.  Also, pain when she attempts to move her leg and change  positions in the bed.  The patient also endorses headache, which she is  scoring 7 to 8/10.   POSITIVE PHYSICAL FINDINGS:  The patient's full physical exam was done on  admission to the medical unit and is noted on the record.  GENERAL:  Today we find that she is a well-nourished, well-developed, medium  built female who appears to be somewhat pale.  VITAL SIGNS:  She is 5 feet 2 inches tall; 160 pounds; temperature 98.2;  pulse 117; respirations 18; blood pressure 111/71.  HEAD:  Normocephalic, atraumatic.  ENT:  PERRLA.  Sclerae nonicteric.  No rhinorrhea.  Oropharynx noninjected.  NECK:  Supple.  No thyromegaly.  CHEST:  Symmetrical and clear to auscultation.  BREASTS:  Deferred.  CARDIOVASCULAR:  S1 and S2 heard.  No clicks, murmurs or gallops.  Apical  rate is regular at 88 and synchronous with radial pulse.  ABDOMEN:  Flat, soft, nontender, nondistended.  GENITOURINARY:  Deferred.  EXTREMITIES:  Inspection reveals no signs of bruising, trauma, petechiae or  dislocations.  Palpation reveals no masses, but patient is acutely tender  over the sciatic area of her right hip.  Range of motion of the leg is  otherwise normal.  Left leg and hip without tenderness.  Extremities are  without edema and normal range of motion.  SKIN:  Pale in tone, but otherwise normal.  No tattoos or remarkable  features.  NEUROLOGIC:  Cranial nerves II-XII are intact.  Extraocular movements are within normal limits.  Motor slow. Sensory grossly intact.   Romberg without  findings.  Gait is antalgic because of the right hip pain, but is otherwise  normal.  Balance satisfactory.  She is able to ambulate on her own, so no  focal findings.  DIAGNOSTIC STUDIES:  Reveal her CBC with hemoglobin of 9.4, hematocrit 29.3,  RBC at 3.72, WBC count at 10,600, MCV at 78.6.  Her hematocrit is 29.3.  She  is mildly hypokalemic with potassium 3.3.  Other electrolytes within normal  limits.  BUN 5, creatinine 0.9.  Her liver enzymes are elevated with an SGOT  of 74 and an SGPT of 85.  TSH is within normal limits at 3.318.  Her urine  drug screen in the emergency room revealed UDS positive for tricyclics and  benzodiazepines and there was some question of patient overdosing on  tricyclics, which she does not currently take but had in the past.  Routine  urinalysis was within normal limits.   MENTAL STATUS EXAM:  This is a fully alert female who is in no acute  distress.  Eye contact is poor.  She is tearful with some psychomotor  slowly.  Speech is decreased in amount, normal in tone, slowed in pace.  Mood is depressed and hopeless.  Thought process logical, no flight of  ideas, no paranoia, no signs of psychosis.  Positive for suicidal thought.  She is quite preoccupied with concerns about her life and her husband and  feels unable to go on with the level of support.  Definitely concentration  is decreased.  No homicidal thought.  No auditory or visual hallucinations.  Cognitively she is intact and oriented times three.  Insight is adequate.  Judgment and impulse control within normal limits.   INITIAL DIAGNOSES:   AXIS I:  Major depression, recurrent, severe.   AXIS II:  Deferred.   AXIS III:  1.  Acute right sacral pain.  2.  Normocytic anemia.  3.  Hypokalemia.  4.  Elevated liver enzymes.  5.  She is status post atypical antipsychotic pain.  6.  Status post benzodiazepine overdose.   AXIS IV:  Severe conflict with her ex-husband with poor  social support  system.   AXIS V:  Current 20; past year 58 to 63.   PLAN:  Voluntarily admit the patient to alleviate her suicidal thought.  We  are going to start by rechecking her labs again in the morning and repeating  a PT/PTT and INR.  We will repeat her CMET and liver enzymes tomorrow since  they are currently elevated and we will track those.  Meanwhile, we are  going to decrease her Paxil CR from 50 mg to 25 mg daily and we will start  her on Zoloft at 25 mg daily and continue her Xanax at 1 mg t.i.d. p.r.n.  Meanwhile, we are going to get an internal medicine consult for acute right  hip pain and we will guaiac all stools and restart her on iron supplements  due to her history of chronic anemia.  We will continue her Seroquel 100 mg  at h.s. and start her on Zoloft and decrease her Paxil CR to 25 mg daily.   ESTIMATED LENGTH OF STAY:  Five to seven days.     Marg  MAS/MEDQ  D:  02/17/2004  T:  02/17/2004  Job:  [id number]

## 2010-09-09 NOTE — H&P (Signed)
[facility]  Patient:    Leslie Benson, Leslie Benson Visit Number: [id number] MRN: [id number]          Service Type: GYN Location: 9300 [id number] Attending Physician:  Susa Raring Dictated by:   Luvenia Redden, M.D. Admit Date:  03/04/2001                           History and Physical  HISTORY OF PRESENT ILLNESS:   This patient is a 50 year old female admitted to the hospital with a history of chronic pelvic pain, dysmenorrhea, and dyspareunia.  She was treated conservatively back in the summertime with antibiotics with little relief of her symptoms, thinking that she could have a mild PID.  She continues to have heavy periods and dysmenorrhea.  She misses one to two days with her menses each month from work.  Oral contraceptives caused severe nausea in the patient - she is not able to take those to try to control her flow and her cramps.  Analgesics have been ineffective.  Patient is now admitted for definitive treatment of this continuing problem.  PAST MEDICAL HISTORY:         Patient has had the usual childhood diseases. She has had no serious illnesses.  She is a gravida 3 para 3.  Surgery includes a tubal reanastomosis.  After her children she had a laparoscopic tubal sterilization, and she is now admitted for this hysterectomy.  She has history of having several small hemangiomas in her liver.  Otherwise, medically she has been pretty healthy.  MEDICATIONS:                  1. Paxil CR 25 mg two a day.                               2. Xanax 1 mg t.i.d.                               3. Nexium 40 mg a day for hyperacidity.                               4. Klonopin twice a day.  FAMILY HISTORY:               There is no history of serious medical illnesses in the family.  There is no history of tuberculosis or bleeding disorders. She has a aunt who had colon cancer and a grandmother that had leukemia.  No history of diabetes.  SYSTEMS REVIEW:                ENT:  No complaints.  CARDIORESPIRATORY:  No complaints.  GI:  No complaints.  GU:  See present illness.  NEUROMUSCULAR: She has been treated for depression for which medications have been listed.  PHYSICAL EXAMINATION:  GENERAL:                      This is a well-nourished, well-developed 50 year old female in no acute distress.  VITAL SIGNS:                  She is 5 feet 4 inches tall, weighs 147 pounds. Blood pressure 116/70, pulse 70 and regular, respirations 12 per minute.  HEENT:  Pupils equal and react to light and accommodation.  Ears, nose, and throat are normal.  Thyroid is normal size.  HEART:                        Rhythm regular, no murmurs heard.  LUNGS:                        Clear to auscultation and percussion.  ABDOMEN:                      Soft, no organ enlargement is noted.  There is no tenderness.  Bowel sounds are present.  She has a healed lower abdominal scar from previous surgery and also a healed subumbilical scar.  SKIN:                         Smooth and dry otherwise.  EXTREMITIES:                  Show no limitations of motion and no deformities.  Peripheral pulses are present.  PELVIC:                       External genitalia normal with a parous outlet. The cervix is smooth and prolapsed nearly to the introitus.  The corpus is anterior, normal size, exquisitely tender to palpation.  No adnexal masses can be palpated.  Bladder and rectum are fairly well supported.  There are no masses in the cul-de-sac.  RECTAL:                       Negative.  IMPRESSION:                   1. Chronic pelvic pain, dysmenorrhea, and                                  dyspareunia.                               2. Chronic menorrhagia.                               3. Uterine prolapse.                               4. Possible adenomyosis.  PLANNED MANAGEMENT:           The patient is admitted for a vaginal hysterectomy.  She  understands that she will no longer have periods and no longer be able to have children.  The patient understands the risks of pelvic surgery, including but not limited to, bleeding, infection, injury to adjacent structures, and death.  Patient agrees to undergo this procedure. Dictated by:   Luvenia Redden, M.D. Attending Physician:  Susa Raring DD:  03/04/01 TD:  03/04/01 Job: [id number]

## 2010-09-09 NOTE — Assessment & Plan Note (Signed)
HISTORY OF PRESENT ILLNESS:  [name] returns today.  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  This was mainly seen in sensory fibers.  The motor fibers were borderline.  She was sent for a bone scan, given her  all over body pain, and this was normal.  She has seen hand surgery, and  they felt that her numbness could be due to problems stemming from her neck,  and she has had an MRI, but she does not have the results yet.   Her vital signs:  Blood pressure 102/56, pulse 94, O2 saturation 99%.  Gait  is slow, but otherwise, no evidence of toe dragging or instability.  Affect  is bright, alert and appearance normal.  She is accompanied by a good friend  of hers who also has fibromyalgia.   The pain is 5/10.  She states the worse part is her neck, but she has pain  diagram showing pain in the head, shoulders, mid back, upper back, low back,  bilateral forearms, hands, right lower quadrant, right groin, bilateral  knees, bilateral feet.  Pain is made worse with working, therapy and  bending.  She is not working currently.  Last worked September 2005.   SOCIAL HISTORY:  She is divorced, lives with her children.   REVIEW OF SYSTEMS:  Positive for nausea, reflux, diarrhea, constipation,  abdominal pain which is chronic.  She has had anxiety, depression, poor  sleep but no suicidal thoughts.   PHYSICAL EXAMINATION:  VITAL SIGNS:  Blood pressure 102/56, pulse 94,  respirations unlabored and O2 saturation 99%.  GENERAL APPEARANCE:  No acute distress.  NECK:  Her neck is stiff.  EXTREMITIES:  She has about a 50% range of flexion extension lateral  rotation and bending.  Spurling's is negative.  She has normal strength  bilateral upper and lower extremities.  She has normal sensation in  bilateral upper extremities.  She has normal deep tendon reflexes in  bilateral upper and lower extremities.  She has  full range of motion in  bilateral upper and lower extremities.   IMPRESSION:  1.  Fibromyalgia syndrome.  2.  Neck pain with decreased range of motion.  Could be related to above.      Can be some cervical spondylosis or facet arthropathy, unless suspicious      for a cervical radiculopathy.  However, this remains in the      differential.   PLAN:  Given that she is already on some Cymbalta, she takes p.r.n. Aleve,  she is taking some Xanax and still has poor sleep as a main problem, would  like to give her a trial of Rozerem.  This may help improve her sleep  architecture and is not a controlled substance.   I will see her back in about two months.  She will follow up with Orthopedic  Hand Surgery in regards to upper extremity complaints.  She will follow up  with GI in regards to her abdominal complaints.  She will follow up with her  primary doctor, as well as, Psychiatry.  If she has had no thyroid studies,  this would be reasonable to check as the lab work dated last fall basically  was CBC, complete metabolic, lipid panel which showed some mild anemia  with hemoglobin 10.4 and elevated cholesterol 222.  Could do a Lyme titer  just to be complete;  however, does not give a history of rash or tick bite.      AEK/MedQ  D:  07/25/2004 12:58:01  T:  07/25/2004 13:57:59  Job #:  [id number]   cc:   Artist Pais Mina Marble, M.D.  [address]  [place]  [place] [address]  Fax: [phone]   Donia Guiles, M.D.  [address] [place]  [place] [address]  Fax: [phone]   Lynden Ang, M.D.  [address]  [place], [place] [address]  Fax: [phone]   Bernette Redbird, M.D.  [address] [place]  [place], [place] [address]  Fax: [phone]

## 2010-09-09 NOTE — Assessment & Plan Note (Signed)
MEDICAL RECORD NUMBER:  [id number]   DATE OF BIRTH:  [birth year]   REASON FOR EVALUATION:  [name] returns today.  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   She has complained of back pain which has been chronic, but also she states  she fell last week and bruised her right buttock; she was trying to change a  light bulb.  She has been seeing psychiatry and has had some medication  changes; her Cymbalta was increased to 60 mg b.i.d.; she thinks that overall  this has helped with her pain.  She has been started on Topamax by her  psychiatrist.  She states she has had a 5-pound weight loss.  She has been  continued on her Xanax 1 mg 4 times daily, but has been also started on  Geodon 40 mg 2 tablets nightly.  She has had no suicidal ideations.  She  states that a positive effect of the Cymbalta was that her loose bowels have  really improved; she states she used to have 15 bowel movements a day, now  she is down to 1 normal BM per day.   EXAMINATION:  VITAL SIGNS:  Blood pressure 120/61, pulse 100, O2 SAT 96% on  room air.  BACK:  Back had some tenderness and some bruising over her right PSIS area  as well as right gluteus maximus area.  She has tenderness along the PSIS  area.  She has tenderness along the lower right anterolateral ribs.  Her  back range of motion is limited due to pain, about 25% flexion and  extension.  NEUROMUSCULAR:  She has full lower extremity strength.  Her upper extremity  strength has some give-away weakness bilaterally, but at least 4+/5 in the  deltoid, biceps and triceps grip, normal deep tendon reflexes bilaterally in  the upper and lower extremities.  Her mood and affect appear normal, is  joking.   IMPRESSION:  Right low back pain, acute on chronic.  She has had fairly  diffuse pain in the axial area.  Certainly, she does have fibromyalgia  syndrome with  depression overlay, but would also like to check a bone scan,  given some more focal areas, particularly on the right side.   PLAN:  1.  I will see her back in 1 month.  2.  In terms of other medications, we will start her on some Lidoderm patch      over the painful areas, one to three on      and off q.12 h.  If taken as directed, this will not interfere with her      other medications, which are multiple, for her psychiatric problems.      AEK/MedQ  D:  06/28/2004 10:09:44  T:  06/28/2004 10:42:16  Job #:  [id number]   cc:   Lynden Ang, M.D.  [address]  [place], [place] [address]  Fax: [phone]   Donia Guiles, M.D.  [address] [place]  [place] [address]  Fax: [phone]   Bernette Redbird, M.D.  [address] [place]  [place], [place] [address]  Fax: [phone]

## 2010-09-09 NOTE — Group Therapy Note (Signed)
HISTORY OF PRESENT ILLNESS:  A 50 year old female kindly referred by Donia Guiles, M.D. for evaluation of chronic widespread pain.  This has been  going on for at least three years.  She has seen Hewitt Shorts, M.D. in  terms of back pain and he did not feel there was a surgical lesion,  although, I do not have any of the imaging studies.  She has seen Dr.  Arvilla Market, who is her PCP, as well as Demetria Pore. Levitin, M.D. from  rheumatology, who did laboratory evaluation showing some anemia with  hemoglobin of 10.4, but otherwise sed rate and ANA were negative.  TSH was  reportedly negative, CPK was reportedly negative.  It was recommended to try  Extra Strength Tylenol.   She has had hospitalization at [facility] for severe back pain and  felt to have fibromyalgia.  She was transferred to the psychiatric service  from the ER after being unresponsive at home, overdosed on 60 tablets of 1  mg Xanax and unknown number of Seroquel.  She states that she was concerned  regarding ex-husband trying to gain custody of her children.  She was  stabilized.  Medications were adjusted.  Discharged on Seroquel 100 mg  q.h.s., Nexium 40 mg daily, Fergon 300 mg b.i.d., Zithromax x2 days, Zoloft  100 daily, Ambien 10 mg q.h.s., and Xanax 1 mg t.i.d.  Follow-up was  initially with Dr. Lafayette Dragon, but the patient changed psychiatrists.   The patient was examined and interviewed with Desmond Lope, RN in  attendance.  The patient's pain level was 8 out of 10 on average, going from  5 to 8.  She does not have any particular part of her body that she has the  most pain.  She says headaches at times, neck pain at times, mid back, upper  back, and lower back pain at times, leg pain bilaterally at times, bilateral  hand and foot tingling seems like it is mostly in the hands.  Her hand  tingling gets worse with staying on the telephone for a prolonged period of  time.   SOCIAL HISTORY:  Single.  Lives with  her children, ages 76 and 73.  Divorced.  Last worked in the summer of last year, 2005.  She is pursuing  disability.   REVIEW OF SYSTEMS:  Please see list.  I questioned her on positive suicidal  thoughts.  She says she has none currently.  She has thought in the past  about committing suicide and indeed she has had a suicide attempt and  hospitalization due to the overdose mentioned above back in October.  She  states she has no active plan at the current time.  She is participating  with her psychiatrist in further therapy.   PHYSICAL EXAMINATION:  VITAL SIGNS:  Blood pressure 110/66, pulse 96,  respiratory rate 17, O2 saturation 100% on room air.  NEUROLOGY:  Gait is normal, affect is alert (normal).  She has mild diffuse  pain to palpation in bilateral upper traps, bilateral cervical thoracic and  lumbar paraspinals as well as over the trochanteric area.  No particular  pain over the vastus medialis or over the elbow regions.  Had some mild pain  over the sternocostal region bilaterally.  She has full range of motion  bilateral upper and lower extremities.  Normal strength bilateral upper and  lower extremities.  She is able to walk and heel-walk.  She has no balance  disorder.  She has  mildly reduced sensation in the index finger compared to  the fifth bilaterally and some question decrease in the great toe compared  to the knee on the right side, but not on the left side.   She has normal muscle bulk, no evidence of fasciculations, no intrinsic  atrophy.   She has no evidence of scoliosis on spinal examination.  She has good range  of motion and flexion and extension.   IMPRESSION:  1.  Diffuse pain.  Workup sounds it has been performed.  I do not have the      results of this, but from what I do have, it appears to be more of a      widespread pain, i.e., fibromyalgia-type syndrome.  2.  Hand and foot tingling certainly can be associated with the above,      however, she  could have concomitant carpal tunnel or even a possible      peripheral neuropathy, although, this is less likely given lack of other      comorbidities such as diabetes.   PLAN:  Given psychiatric history, would minimize medication usage and indeed  for fibromyalgia would not use narcotic analgesics anyway.  Will add  Flexeril 5 mg t.i.d. taken on a regular basis.  She will continue with her  Xanax, Seroquel as prescribed by her psychiatrist.  Nexium and Asacol as  prescribed by Dr. Arvilla Market.   I have referred her to physical therapy for aerobic conditioning.  Also  trial TENS unit.  Other medications that might be beneficial in her case  would be something like Lyrica.  She states she is on one other  antidepressant medication, but does not have her pill bottles with her.  Would consider Cymbalta as a good antidepressant choice given her other  comorbidities.   Will check EMG, one upper and bilateral lower extremities.  I have asked her  to forward additional notes from Dr. Roselie Skinner office in regard to some of  the imaging studies of her spine and other pertinent workup.  I will see her  back in one month to follow up on her therapy progress.      AEK/MedQ  D:  05/10/2004 11:21:34  T:  05/10/2004 12:55:45  Job #:  [id number]   cc:   Donia Guiles, M.D.  [address] [place]  [place] [address]  Fax: [phone]   Demetria Pore. Coral Spikes, M.D.  [address]  [place]  [place] [address]  Fax: [phone]   Hewitt Shorts, M.D.  [address]  [place]  [place] [address]  Fax: [phone]   Barbaraann Barthel, M.D.
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
## 2011-01-13 ENCOUNTER — Ambulatory Visit
Admission: RE | Admit: 2011-01-13 | Discharge: 2011-01-13 | Disposition: A | Discharge status: Home / Self Care | Payer: Medicare Other | Source: Ambulatory Visit | Attending: Family Medicine | Admitting: Family Medicine

## 2011-01-13 ENCOUNTER — Other Ambulatory Visit: Payer: Self-pay | Admitting: Family Medicine

## 2011-01-13 DIAGNOSIS — R51 Headache: Secondary | ICD-10-CM

## 2011-01-16 LAB — CLOSTRIDIUM DIFFICILE EIA

## 2011-01-16 LAB — STOOL CULTURE

## 2011-01-16 LAB — LIPID PANEL
HDL: 33 — ABNORMAL LOW
LDL Cholesterol: 81
Total CHOL/HDL Ratio: 4
Triglycerides: 86
VLDL: 17

## 2011-01-16 LAB — CBC
HCT: 29.8 — ABNORMAL LOW
Hemoglobin: 9.8 — ABNORMAL LOW
Hemoglobin: 10.5 — ABNORMAL LOW
Hemoglobin: 8.7 — ABNORMAL LOW
Hemoglobin: 9.6 — ABNORMAL LOW
Hemoglobin: 9.9 — ABNORMAL LOW
MCHC: 32.9
MCHC: 33.2
MCHC: 34.1
MCV: 79.3
RBC: 3.77 — ABNORMAL LOW
RBC: 3.24 — ABNORMAL LOW
RBC: 3.57 — ABNORMAL LOW
RBC: 3.73 — ABNORMAL LOW
RBC: 4.04
RDW: 16.7 — ABNORMAL HIGH
WBC: 8.6
WBC: 5.1
WBC: 6.5
WBC: 7.5

## 2011-01-16 LAB — LIPASE, BLOOD
Lipase: 19
Lipase: 33

## 2011-01-16 LAB — EHEC TOXIN BY EIA, STOOL: EHEC Toxin by EIA: NEGATIVE

## 2011-01-16 LAB — BASIC METABOLIC PANEL
BUN: 7
CO2: 18 — ABNORMAL LOW
CO2: 19
Calcium: 8.3 — ABNORMAL LOW
Calcium: 9.4
Creatinine, Ser: 0.77
Creatinine, Ser: 0.82
GFR calc Af Amer: >60
GFR calc non Af Amer: >60
Glucose, Bld: 114 — ABNORMAL HIGH
Sodium: 143

## 2011-01-16 LAB — URINALYSIS, ROUTINE W REFLEX MICROSCOPIC
Bilirubin Urine: NEGATIVE
Bilirubin Urine: NEGATIVE
Ketones, ur: 15 — AB
Ketones, ur: NEGATIVE
Nitrite: NEGATIVE
Nitrite: NEGATIVE
Protein, ur: NEGATIVE
Urobilinogen, UA: 1
Urobilinogen, UA: 0.2
pH: 6

## 2011-01-16 LAB — HEPATITIS PANEL, ACUTE
Hep A IgM: NEGATIVE
Hep B C IgM: NEGATIVE
Hepatitis B Surface Ag: NEGATIVE

## 2011-01-16 LAB — COMPREHENSIVE METABOLIC PANEL
ALT: 107 — ABNORMAL HIGH
ALT: 34
ALT: 45 — ABNORMAL HIGH
AST: 42 — ABNORMAL HIGH
Alkaline Phosphatase: 163 — ABNORMAL HIGH
Alkaline Phosphatase: 108
Alkaline Phosphatase: 92
BUN: 4 — ABNORMAL LOW
CO2: 19
CO2: 20
CO2: 21
Calcium: 8.9
Chloride: 110
Chloride: 115 — ABNORMAL HIGH
GFR calc Af Amer: >60
GFR calc non Af Amer: >60
GFR calc non Af Amer: >60
Glucose, Bld: 107 — ABNORMAL HIGH
Glucose, Bld: 100 — ABNORMAL HIGH
Glucose, Bld: 103 — ABNORMAL HIGH
Potassium: 3.3 — ABNORMAL LOW
Potassium: 3.7
Sodium: 137
Sodium: 141
Sodium: 142
Total Bilirubin: 0.9
Total Protein: 5.6 — ABNORMAL LOW

## 2011-01-16 LAB — DIFFERENTIAL
Basophils Absolute: 0
Basophils Relative: 0
Basophils Relative: 1
Eosinophils Absolute: 0
Eosinophils Absolute: 0.1
Eosinophils Relative: 1
Lymphocytes Relative: 42
Lymphs Abs: 1.2
Lymphs Abs: 2.1
Monocytes Absolute: 0.4
Monocytes Absolute: 0.6
Monocytes Relative: 11
Monocytes Relative: 5
Neutro Abs: 2.2
Neutrophils Relative %: 76
Neutrophils Relative %: 44
Neutrophils Relative %: 77

## 2011-01-16 LAB — URINE CULTURE: Colony Count: 3000

## 2011-01-16 LAB — HEPATIC FUNCTION PANEL
Alkaline Phosphatase: 136 — ABNORMAL HIGH
Bilirubin, Direct: <0.1
Total Protein: 6.9

## 2011-01-16 LAB — FOLATE: Folate: >20

## 2011-01-16 LAB — AMYLASE: Amylase: 56

## 2011-01-16 LAB — CULTURE, BLOOD (ROUTINE X 2)

## 2011-01-16 LAB — VITAMIN B12: Vitamin B-12: 598 (ref 211–911)

## 2011-01-16 LAB — IRON AND TIBC: Saturation Ratios: 13 — ABNORMAL LOW

## 2011-01-16 LAB — OVA AND PARASITE EXAMINATION

## 2011-01-16 LAB — URINE MICROSCOPIC-ADD ON

## 2011-01-16 LAB — OCCULT BLOOD X 1 CARD TO LAB, STOOL: Fecal Occult Bld: NEGATIVE

## 2011-01-16 LAB — RETICULOCYTES: Retic Ct Pct: 1.3

## 2011-01-24 LAB — CREATININE, SERUM: GFR calc Af Amer: >60

## 2011-01-24 LAB — HEMOGLOBIN: Hemoglobin: 9.7 — ABNORMAL LOW

## 2011-01-24 LAB — CBC
MCHC: 31.5
RBC: 3.86 — ABNORMAL LOW
RDW: 17.6 — ABNORMAL HIGH

## 2011-01-24 LAB — BASIC METABOLIC PANEL
CO2: 23
Calcium: 8.9
Creatinine, Ser: 0.85
GFR calc Af Amer: >60

## 2011-01-24 LAB — AMYLASE: Amylase: 43

## 2012-06-20 ENCOUNTER — Encounter (HOSPITAL_COMMUNITY): Payer: Self-pay

## 2012-06-20 ENCOUNTER — Emergency Department (HOSPITAL_COMMUNITY)
Admission: EM | Admit: 2012-06-20 | Discharge: 2012-06-20 | Disposition: A | Discharge status: Home / Self Care | Payer: Medicare HMO | Attending: Emergency Medicine | Admitting: Emergency Medicine

## 2012-06-20 DIAGNOSIS — F419 Anxiety disorder, unspecified: Secondary | ICD-10-CM

## 2012-06-20 DIAGNOSIS — Z79899 Other long term (current) drug therapy: Secondary | ICD-10-CM | POA: Insufficient documentation

## 2012-06-20 DIAGNOSIS — Z8739 Personal history of other diseases of the musculoskeletal system and connective tissue: Secondary | ICD-10-CM | POA: Insufficient documentation

## 2012-06-20 DIAGNOSIS — F411 Generalized anxiety disorder: Secondary | ICD-10-CM | POA: Insufficient documentation

## 2012-06-20 DIAGNOSIS — F430 Acute stress reaction: Secondary | ICD-10-CM

## 2012-06-20 DIAGNOSIS — F410 Panic disorder [episodic paroxysmal anxiety] without agoraphobia: Secondary | ICD-10-CM | POA: Insufficient documentation

## 2012-06-20 HISTORY — DX: Spinal stenosis, site unspecified: M48.00

## 2012-06-20 HISTORY — DX: Fibromyalgia: M79.7

## 2012-06-20 HISTORY — DX: Panic disorder (episodic paroxysmal anxiety): F41.0

## 2012-06-20 MED ORDER — GABAPENTIN 600 MG PO TABS: 600.0000 mg | ORAL_TABLET | Freq: Three times a day (TID) | ORAL | Status: DC

## 2012-06-20 MED ORDER — LORAZEPAM 1 MG PO TABS
1.0000 mg | ORAL_TABLET | Freq: Once | ORAL | Status: AC
  Administered 2012-06-20: 1 mg via ORAL
  Filled 2012-06-20: qty 1

## 2012-06-20 MED ORDER — ALPRAZOLAM 1 MG PO TABS: 1.0000 mg | ORAL_TABLET | Freq: Three times a day (TID) | ORAL | Status: DC | PRN

## 2012-06-20 MED ORDER — ACETAMINOPHEN 500 MG PO TABS
1000.0000 mg | ORAL_TABLET | Freq: Once | ORAL | Status: AC
  Administered 2012-06-20: 1000 mg via ORAL
  Filled 2012-06-20: qty 2

## 2012-06-20 MED ORDER — TRAMADOL HCL 50 MG PO TABS: 100.0000 mg | ORAL_TABLET | Freq: Four times a day (QID) | ORAL | Status: DC | PRN

## 2012-06-20 MED ORDER — IBUPROFEN 200 MG PO TABS
600.0000 mg | ORAL_TABLET | Freq: Once | ORAL | Status: AC
  Administered 2012-06-20: 600 mg via ORAL
  Filled 2012-06-20: qty 3

## 2012-06-20 MED ORDER — QUETIAPINE FUMARATE 300 MG PO TABS: 600.0000 mg | ORAL_TABLET | Freq: Every day | ORAL | Status: DC

## 2012-06-20 MED ORDER — VENLAFAXINE HCL ER 75 MG PO CP24: 225.0000 mg | ORAL_CAPSULE | Freq: Every morning | ORAL | Status: DC

## 2012-06-20 MED ORDER — PROMETHAZINE HCL 25 MG/ML IJ SOLN
25.0000 mg | Freq: Once | INTRAMUSCULAR | Status: AC
  Administered 2012-06-20: 25 mg via INTRAMUSCULAR
  Filled 2012-06-20: qty 1
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
## 2012-06-20 NOTE — Discharge Instructions (Signed)
Emotional Crisis Part of your problem today may be due to an emotional crisis. Emotional states can cause many different physical signs and symptoms. These may include:  Chest or stomach pain.  Fluttering heartbeat.  Passing out.  Breathing difficulty.  Headaches.  Trembling.  Hot or cold flashes.  Numbness.  Dizziness.  Unusual muscle pain or fatigue.  Insomnia. When you have other medical problems, they are often made worse by emotional upsets. Emotional crises can increase your stress and anxiety. Finding ways to reduce your stress level can make you feel better. You will become more capable of dealing with these emotional states. Regular physical exercise such as walking can be very beneficial. Counseling or medicine to treat anxiety or depression may also be needed. See your caregiver if you have further problems or questions about your condition. Document Released: 04/10/2005 Document Revised: 07/03/2011 Document Reviewed: 09/25/2006 [facility] Patient Information 2013 [place], [place].  RESOURCE GUIDE  Chronic Pain Problems: Contact [facility]  [phone] Patients need to be referred by their primary care doctor.  Insufficient Money for Medicine: Contact United Way:  call "211."   No Primary Care Doctor: - Call Health Connect  [phone] - can help you locate a primary care doctor that  accepts your insurance, provides certain services, etc. - Physician Referral Service- [phone]  Agencies that provide inexpensive medical care: - [facility]  [phone] - [facility]  [phone] - [facility]  [phone] - Women's Clinic  [phone] - Planned Parenthood  [phone] [facility]  [phone]  Medicaid-accepting [facility] Providers: - [facility]- [address]  [phone], Mon-Fri 9am-7pm, Sat 9am-1pm - [facility]- [address] [place],  Suite [place]  [phone] - [facility]- [address] [place]  [phone] [facility]- [address]  [phone] - Renaye Rakers- [address] [place], [address], [phone]  Only accepts [place] patients after they have their name  applied to their card  Self Pay (no insurance) in [place]: - Sickle Cell Patients: Dr Willey Blade, [facility]  [address] [place], [phone] - [facility]- [address] [place]  [phone]       [facility] [place]- [address]       -     [facility]- see information above (Speak to [employer] if you do not have insurance)       -  [facility]- [address],  [phone]       -  [facility]- [address] [phone]       -  Dr Julio Sicks-  [address], [place], [phone]       -  [facility] [place] - [address] [phone]       -  [facility]- [address] [phone], also [address] [phone]       -    [facility]- [address] [place], [phone], 1st & 3rd Saturday        every month, 10am-1pm  [facility] [address] [place], [place] [address] [phone]  [facility] [address] [place], [place] [address] [phone]  1) Find  a Doctor and Pay Out of Pocket Although you won't have to find out who is covered by your insurance plan, it is a good idea to ask around and get recommendations. You will then need to call the office and see if the doctor you have chosen will accept you as a new patient and what types of options they offer for patients who are self-pay. Some doctors offer discounts or will set up payment plans for their patients who do not have insurance, but you will need to ask so you aren't surprised when you get to your appointment.  2) Contact Your Local  Health Department Not all health departments have doctors that can see patients for sick visits, but many do, so it is worth a call to see if yours does. If you don't know where your local health department is, you can check in your phone book. The CDC also has a tool to help you locate your state's health department, and many state websites also have listings of all of their local health departments.  3) Find a Walk-in Clinic If your illness is not likely to be very severe or complicated, you may want to try a walk in clinic. These are popping up all over the country in pharmacies, drugstores, and shopping centers. They're usually staffed by nurse practitioners or physician assistants that have been trained to treat common illnesses and complaints. They're usually fairly quick and inexpensive. However, if you have serious medical issues or chronic medical problems, these are probably not your best option  STD Testing - [facility] of [facility], STD Clinic, [address], [place], phone [phone] or [phone].  Monday - Friday, call for an appointment. [facility] Department of [employer], STD Clinic, [address], [place], phone [phone] or [phone].  Monday - Friday, call for an appointment.  Abuse/Neglect: [facility] Child Abuse Hotline 314-007-9088 [facility] Child Abuse Hotline (787)078-7873 (After Hours)  Emergency Shelter:  [facility] 905-636-0605  Maternity Homes: - Room at the [place] [facility] [phone] - [facility] [phone]  MRSA Hotline #:   562-785-4882  Dental Assistance If unable to pay or uninsured, contact:  [facility]. to become qualified for the adult dental clinic.  Patients with Medicaid: [facility] [phone] [address], [phone] [address] [phone]  If unable to pay, or uninsured,  contact [facility] [phone] in [place], [phone] in [facility]) to become qualified for the adult dental clinic  [facility] [address] [place], [place] [address] [phone] [email or web address]  Other Proofreader Services: - [facility]- [address] [place], [place], [place], [address], [phone], Ext. [phone], 2nd and 4th Thursday of the month at 6:30am.  10 clients each day by appointment, can sometimes see walk-in patients if someone does not show for an appointment. [facility]- [address] [place], [place], [address], [phone] - [facility] [address] [place], [place], [address], [phone] - [facility]- [phone] [facility]- [phone] [facility] Health [phone]       Behavioral Health Resources in the [facility]  Intensive Outpatient Programs: [facility]      [address] [place], [place] [phone] Both a day and evening program       [place]  [facility] Outpatient     [address]        [place], [place] [address] [phone]         ADS: [facility] [address] [place] [phone]  [facility] Mental Health ACCESS LINE: [phone] or [phone] [address] [place], [place] [address] [email or web address]   Substance Abuse Resources: - Alcohol and Drug Services  [phone] - [facility] 4384699247 - [facility] [place] [phone] Floydene Flock [phone] - Residential & Outpatient Substance Abuse Program  5181798007  Psychological Services: [facility]  [phone] [facility]  [phone] - [facility], [phone] [place]. [address], [place], ACCESS LINE: [phone] or [phone], [email or web address]  Mobile Crisis  Teams:                                        Therapeutic Alternatives         Mobile Crisis Care Unit [phone]             [facility] [address] Ginette Otto [phone]                                         Interventionist [address] [place] [address] [place] [phone]  Self-Help/Support Groups: Mental Health Assoc. of [employer] of support groups 8567369283 (call for more info)   Narcotics Anonymous (NA) Caring Services [address] [place] - 2 meetings at this location  Residential Treatment Programs:  [facility]      [address]        [place]       [phone]         [facility] [address] [place] [address] [place], [place]  [address] [phone]  [facility]  [address] [place], [place] [address] [phone] Admissions: 8am-3pm M-F  [facility]     [address]        [place], [place] [address]       [phone]         [facility] [address] [place], [place] [phone]  [facility] [address] [place], [place] [phone]  [facility] - Intensive Outpatient      [address]     [place], [place]       [phone]         [facility] (Addiction Recovery Care [facility].)     [address] [place], [place] [phone] or [phone]  Residential Treatment Services (RTS), Medicaid [address] [place], [place] [phone]  [facility] [address]                                               [address] [place] [phone]  [facility] Resources: Estate manager/land agent [phone]               General Therapy  Angie Fava, PhD        [address] [place], [place] [address]          [phone]   Insurance  [facility] Behavioral   [address] [place], [place] [address] [phone]  [facility] Recovery [address] [place], [place] [address] [phone] Insurance/Medicaid/sponsorship through [facility] and Families                                              [address]                                        [place], [place] [address]    Therapy/tele-psych/case         [phone]          [facility] [address] [place], [place]  [address]  Adolescent/group home/case management [phone]                                           Creola Corn PhD       General therapy       Insurance   [phone]         Dr. Lolly Mustache, Insurance, M-F [phone]  [facility]  [employer] [facility] Dept. [address]                 [address]         [address] [place] [address]  Blondell Reveal Phone:  [phone]                                  Phone:  [phone]                   Phone:  [phone]  [facility], [phone] - [facility] - [facility]- [phone]       -     [facility] in [place], [address]             [phone], Insurance  [place] Child Abuse Hotline 5635505363 or [phone] (After Hours)

## 2013-05-14 ENCOUNTER — Other Ambulatory Visit: Payer: Self-pay | Admitting: Family Medicine

## 2013-09-21 ENCOUNTER — Inpatient Hospital Stay (HOSPITAL_COMMUNITY)
Admission: EM | Admit: 2013-09-21 | Discharge: 2013-09-25 | DRG: 390 | Disposition: A | Discharge status: Home / Self Care | Payer: Medicare HMO | Attending: Internal Medicine | Admitting: Internal Medicine

## 2013-09-21 ENCOUNTER — Emergency Department (HOSPITAL_COMMUNITY): Payer: Medicare HMO

## 2013-09-21 ENCOUNTER — Encounter (HOSPITAL_COMMUNITY): Payer: Self-pay | Admitting: Emergency Medicine

## 2013-09-21 DIAGNOSIS — K219 Gastro-esophageal reflux disease without esophagitis: Secondary | ICD-10-CM | POA: Diagnosis present

## 2013-09-21 DIAGNOSIS — R7401 Elevation of levels of liver transaminase levels: Secondary | ICD-10-CM | POA: Diagnosis present

## 2013-09-21 DIAGNOSIS — IMO0001 Reserved for inherently not codable concepts without codable children: Secondary | ICD-10-CM | POA: Diagnosis present

## 2013-09-21 DIAGNOSIS — R10819 Abdominal tenderness, unspecified site: Secondary | ICD-10-CM

## 2013-09-21 DIAGNOSIS — R198 Other specified symptoms and signs involving the digestive system and abdomen: Secondary | ICD-10-CM

## 2013-09-21 DIAGNOSIS — K5900 Constipation, unspecified: Secondary | ICD-10-CM | POA: Diagnosis present

## 2013-09-21 DIAGNOSIS — Z79899 Other long term (current) drug therapy: Secondary | ICD-10-CM

## 2013-09-21 DIAGNOSIS — Z6832 Body mass index (BMI) 32.0-32.9, adult: Secondary | ICD-10-CM

## 2013-09-21 DIAGNOSIS — R52 Pain, unspecified: Secondary | ICD-10-CM | POA: Diagnosis present

## 2013-09-21 DIAGNOSIS — Z791 Long term (current) use of non-steroidal anti-inflammatories (NSAID): Secondary | ICD-10-CM

## 2013-09-21 DIAGNOSIS — K5909 Other constipation: Secondary | ICD-10-CM | POA: Diagnosis present

## 2013-09-21 DIAGNOSIS — R1013 Epigastric pain: Secondary | ICD-10-CM | POA: Diagnosis present

## 2013-09-21 DIAGNOSIS — K2270 Barrett's esophagus without dysplasia: Secondary | ICD-10-CM | POA: Diagnosis present

## 2013-09-21 DIAGNOSIS — Z9089 Acquired absence of other organs: Secondary | ICD-10-CM

## 2013-09-21 DIAGNOSIS — E860 Dehydration: Secondary | ICD-10-CM | POA: Diagnosis present

## 2013-09-21 DIAGNOSIS — F410 Panic disorder [episodic paroxysmal anxiety] without agoraphobia: Secondary | ICD-10-CM | POA: Diagnosis present

## 2013-09-21 DIAGNOSIS — R7989 Other specified abnormal findings of blood chemistry: Secondary | ICD-10-CM

## 2013-09-21 DIAGNOSIS — K560 Paralytic ileus: Principal | ICD-10-CM | POA: Diagnosis present

## 2013-09-21 DIAGNOSIS — Z885 Allergy status to narcotic agent status: Secondary | ICD-10-CM

## 2013-09-21 DIAGNOSIS — R7402 Elevation of levels of lactic acid dehydrogenase (LDH): Secondary | ICD-10-CM | POA: Diagnosis present

## 2013-09-21 DIAGNOSIS — M797 Fibromyalgia: Secondary | ICD-10-CM

## 2013-09-21 DIAGNOSIS — E669 Obesity, unspecified: Secondary | ICD-10-CM | POA: Diagnosis present

## 2013-09-21 DIAGNOSIS — Z881 Allergy status to other antibiotic agents status: Secondary | ICD-10-CM

## 2013-09-21 DIAGNOSIS — M47812 Spondylosis without myelopathy or radiculopathy, cervical region: Secondary | ICD-10-CM | POA: Diagnosis present

## 2013-09-21 DIAGNOSIS — R946 Abnormal results of thyroid function studies: Secondary | ICD-10-CM | POA: Diagnosis present

## 2013-09-21 DIAGNOSIS — G8929 Other chronic pain: Secondary | ICD-10-CM | POA: Diagnosis present

## 2013-09-21 DIAGNOSIS — R740 Nonspecific elevation of levels of transaminase and lactic acid dehydrogenase [LDH]: Secondary | ICD-10-CM

## 2013-09-21 DIAGNOSIS — K3189 Other diseases of stomach and duodenum: Secondary | ICD-10-CM | POA: Diagnosis present

## 2013-09-21 HISTORY — DX: Barrett's esophagus without dysplasia: K22.70

## 2013-09-21 HISTORY — DX: Spondylosis without myelopathy or radiculopathy, cervical region: M47.812

## 2013-09-21 HISTORY — DX: Diaphragmatic hernia without obstruction or gangrene: K44.9

## 2013-09-21 LAB — CBC
HCT: 43.2 % (ref 36.0–46.0)
Hemoglobin: 14.5 g/dL (ref 12.0–15.0)
MCH: 30.9 pg (ref 26.0–34.0)
MCHC: 33.6 g/dL (ref 30.0–36.0)
MCV: 92.1 fL (ref 78.0–100.0)
PLATELETS: 271 10*3/uL (ref 150–400)
RBC: 4.69 MIL/uL (ref 3.87–5.11)
RDW: 12.9 % (ref 11.5–15.5)
WBC: 7.1 10*3/uL (ref 4.0–10.5)

## 2013-09-21 LAB — I-STAT CG4 LACTIC ACID, ED: LACTIC ACID, VENOUS: 3.1 mmol/L — AB (ref 0.5–2.2)

## 2013-09-21 LAB — BASIC METABOLIC PANEL
BUN: 12 mg/dL (ref 6–23)
CHLORIDE: 101 meq/L (ref 96–112)
CO2: 26 mEq/L (ref 19–32)
Calcium: 10.4 mg/dL (ref 8.4–10.5)
Creatinine, Ser: 0.96 mg/dL (ref 0.50–1.10)
GFR calc Af Amer: 76 mL/min — ABNORMAL LOW (ref >90)
GFR calc non Af Amer: 66 mL/min — ABNORMAL LOW (ref >90)
GLUCOSE: 77 mg/dL (ref 70–99)
Potassium: 3.9 mEq/L (ref 3.7–5.3)
Sodium: 143 mEq/L (ref 137–147)

## 2013-09-21 LAB — I-STAT CHEM 8, ED
BUN: 12 mg/dL (ref 6–23)
CALCIUM ION: 1.23 mmol/L (ref 1.12–1.23)
CHLORIDE: 102 meq/L (ref 96–112)
CREATININE: 1 mg/dL (ref 0.50–1.10)
Glucose, Bld: 72 mg/dL (ref 70–99)
HCT: 45 % (ref 36.0–46.0)
Hemoglobin: 15.3 g/dL — ABNORMAL HIGH (ref 12.0–15.0)
Potassium: 3.5 mEq/L — ABNORMAL LOW (ref 3.7–5.3)
Sodium: 141 mEq/L (ref 137–147)
TCO2: 27 mmol/L (ref 0–100)

## 2013-09-21 LAB — I-STAT TROPONIN, ED: TROPONIN I, POC: 0 ng/mL (ref 0.00–0.08)

## 2013-09-21 LAB — LIPASE, BLOOD: Lipase: 32 U/L (ref 11–59)

## 2013-09-21 MED ORDER — MORPHINE SULFATE 4 MG/ML IJ SOLN
4.0000 mg | Freq: Once | INTRAMUSCULAR | Status: AC
  Administered 2013-09-21: 4 mg via INTRAVENOUS
  Filled 2013-09-21: qty 1

## 2013-09-21 MED ORDER — FAMOTIDINE IN NACL 20-0.9 MG/50ML-% IV SOLN
20.0000 mg | Freq: Once | INTRAVENOUS | Status: AC
  Administered 2013-09-21: 20 mg via INTRAVENOUS
  Filled 2013-09-21: qty 50

## 2013-09-21 MED ORDER — SODIUM CHLORIDE 0.9 % IV BOLUS (SEPSIS)
1000.0000 mL | Freq: Once | INTRAVENOUS | Status: AC
  Administered 2013-09-21: 1000 mL via INTRAVENOUS

## 2013-09-21 MED ORDER — ONDANSETRON HCL 4 MG/2ML IJ SOLN
4.0000 mg | Freq: Once | INTRAMUSCULAR | Status: AC
  Administered 2013-09-21: 4 mg via INTRAVENOUS
  Filled 2013-09-21: qty 2

## 2013-09-21 MED ORDER — IOHEXOL 300 MG/ML  SOLN
50.0000 mL | Freq: Once | INTRAMUSCULAR | Status: AC | PRN
  Administered 2013-09-21: 50 mL via ORAL

## 2013-09-21 NOTE — ED Provider Notes (Signed)
CSN: [id number]     Arrival date & time 09/21/13  2211 History   First MD Initiated Contact with Patient 09/21/13 2312     Chief Complaint  Patient presents with  . Chest Pain  . Abdominal Pain     (Consider location/radiation/quality/duration/timing/severity/associated sxs/prior Treatment) HPI Comments: 53 y/o female comes in with cc of chest pain and abd pain. Outside of Barrett's esophagus, patient has no significant medical hx. + hx of cholecystectomy. States that she has been having epigastric chest pain, Left upper abd pain for several months now. The pain is usually off and on. Her current pain however is more severe than usual. The pain started y'day and is constant, severe. Pain starts in the epigastrium, and radiates to her lower chest, and left and right ribs. Pain also moves to the back. There is + nausea, no emesis, + loose BM today x 6, non bloody. Pt denies cough, uti like sx, renal stones hx, vaginal bleeding/discharge, or any pelvic pathologies. No hx of substance abuse.  Patient is a 53 y.o. female presenting with chest pain and abdominal pain. The history is provided by the patient.  Chest Pain Associated symptoms: abdominal pain and nausea   Associated symptoms: no cough, no headache, no shortness of breath and not vomiting   Abdominal Pain Associated symptoms: chest pain, diarrhea and nausea   Associated symptoms: no constipation, no cough, no dysuria, no hematuria, no shortness of breath and no vomiting     Past Medical History  Diagnosis Date  . Fibromyalgia   . Spinal stenosis   . Panic attack   . Barrett esophagus   . Paraesophageal hiatal hernia     repaired 2009  . Cervical arthritis 09/22/2013   Past Surgical History  Procedure Laterality Date  . Tubal ligation  1990s?  Marland Kitchen Cholecystectomy  1980s?  . Vaginal hysterectomy  03/04/2001  . Ankle surgery    . Laparoscopic paraesophageal hernia repair  03/05/2008    Dr Johney Maine  . Laparoscopic nissen  fundoplication  17/61/6073   History reviewed. No pertinent family history. History  Substance Use Topics  . Smoking status: Never Smoker   . Smokeless tobacco: Never Used  . Alcohol Use: No   OB History   Grav Para Term Preterm Abortions TAB SAB Ect Mult Living                 Review of Systems  Constitutional: Positive for activity change.  HENT: Negative for facial swelling.   Respiratory: Negative for cough, shortness of breath and wheezing.   Cardiovascular: Positive for chest pain.  Gastrointestinal: Positive for nausea, abdominal pain and diarrhea. Negative for vomiting, constipation, blood in stool and abdominal distention.  Genitourinary: Negative for dysuria, hematuria, flank pain and difficulty urinating.  Musculoskeletal: Negative for neck pain.  Skin: Negative for color change.  Neurological: Negative for speech difficulty and headaches.  Hematological: Does not bruise/bleed easily.  Psychiatric/Behavioral: Negative for confusion.      Allergies  Codeine and Sulfa antibiotics  Home Medications   Prior to Admission medications   Medication Sig Start Date End Date Taking? Authorizing Provider  ALPRAZolam Duanne Moron) 1 MG tablet Take 2 mg by mouth 3 (three) times daily as needed for anxiety (anxiety). 06/20/12  Yes Virgel Manifold, MD  gabapentin (NEURONTIN) 600 MG tablet Take 1 tablet (600 mg total) by mouth 3 (three) times daily. 06/20/12  Yes Virgel Manifold, MD  naproxen sodium (ANAPROX) 550 MG tablet Take 550 mg by mouth 2 (  two) times daily as needed for moderate pain.  09/09/13  Yes Historical Provider, MD  QUEtiapine (SEROQUEL) 400 MG tablet Take 800 mg by mouth at bedtime.   Yes Historical Provider, MD  traMADol (ULTRAM) 50 MG tablet Take 2 tablets (100 mg total) by mouth every 6 (six) hours as needed for pain. 06/20/12  Yes Virgel Manifold, MD  venlafaxine XR (EFFEXOR-XR) 75 MG 24 hr capsule Take 75 mg by mouth 3 (three) times daily.   Yes Historical Provider, MD   BP  94/64  Pulse 73  Temp(Src) 98.1 F (36.7 C) (Oral)  Resp 16  Ht 5\' 4"  (1.626 m)  Wt 185 lb 4.8 oz (84.052 kg)  BMI 31.79 kg/m2  SpO2 98% Physical Exam  Nursing note and vitals reviewed. Constitutional: She is oriented to person, place, and time. She appears well-developed and well-nourished.  HENT:  Head: Normocephalic and atraumatic.  Eyes: EOM are normal. Pupils are equal, round, and reactive to light.  Neck: Neck supple.  Cardiovascular: Normal rate, regular rhythm, normal heart sounds and intact distal pulses.   No murmur heard. Pulmonary/Chest: Effort normal. No respiratory distress.  Abdominal: Soft. She exhibits distension. There is tenderness. There is guarding. There is no rebound.  Epigastric and left sided tenderness to palpation, with some guarding. Right upper abd has some mild discomfort as well, neg murphy's.  Neurological: She is alert and oriented to person, place, and time.  Skin: Skin is warm and dry.    ED Course  Procedures (including critical care time) Labs Review Labs Reviewed  BASIC METABOLIC PANEL - Abnormal; Notable for the following:    GFR calc non Af Amer 66 (*)    GFR calc Af Amer 76 (*)    All other components within normal limits  HEPATIC FUNCTION PANEL - Abnormal; Notable for the following:    AST 63 (*)    ALT 84 (*)    Alkaline Phosphatase 197 (*)    Total Bilirubin 0.2 (*)    All other components within normal limits  TSH - Abnormal; Notable for the following:    TSH 12.410 (*)    All other components within normal limits  T3, FREE - Abnormal; Notable for the following:    T3, Free 2.1 (*)    All other components within normal limits  I-STAT CG4 LACTIC ACID, ED - Abnormal; Notable for the following:    Lactic Acid, Venous 3.10 (*)    All other components within normal limits  I-STAT CHEM 8, ED - Abnormal; Notable for the following:    Potassium 3.5 (*)    Hemoglobin 15.3 (*)    All other components within normal limits  CBC   LIPASE, BLOOD  T4, FREE  I-STAT TROPOININ, ED  I-STAT CHEM 8, ED  I-STAT CG4 LACTIC ACID, ED    Imaging Review Dg Abd Acute W/chest  09/22/2013   CLINICAL DATA:  Upper abdominal pain.  Rule out free air.  EXAM: ACUTE ABDOMEN SERIES (ABDOMEN 2 VIEW & CHEST 1 VIEW)  COMPARISON:  Abdominal CT from earlier the same day  FINDINGS: Normal progression of enteric contrast, now in the proximal transverse colon. There is a moderate volume of colonic gas, but no evidence of obstruction. No dilated bowel to suggest significant ileus. No pneumoperitoneum. Mild atelectatic changes at the bases. Trace pleural fluid seen on the previous CT is not clearly seen. Normal heart size.  IMPRESSION: Negative for bowel obstruction or perforation.   Electronically Signed   By:  Jorje Guild M.D.   On: 09/22/2013 04:35     EKG Interpretation   Date/Time:  Sunday Sep 21 2013 22:13:29 EDT Ventricular Rate:  102 PR Interval:  153 QRS Duration: 111 QT Interval:  347 QTC Calculation: 452 R Axis:   122 Text Interpretation:  Sinus tachycardia Low voltage, precordial leads  Probable right ventricular hypertrophy Minimal ST elevation, inferior  leads ED PHYSICIAN INTERPRETATION AVAILABLE IN [facility] Confirmed  by TEST, Record ([id number]) on 09/23/2013 7:09:18 AM      MDM   Final diagnoses:  Abdominal tenderness  Abdominal guarding  Adynamic ileus    Pt comes to the ER with cc of abd pain Pt has hx of cholecystectomy, and also surgical repair for Barrett's esophagus. She has guarding on my exam - no rebound. Labs are benign - except for lactate > 3. Pt reports that she is unable to have emesis due to her surgical procedure in the past.  CT abd ordered- suspicion high for SBO based on hx and exam. Ct didn't show any SBP however - although distended abdomen with ileus appreciated.  Medicine called for admission.  Hospitalist on exam had peritoneal findings. I repeated the exam - and she did have some  peritonela findings on the right side. Hospitalist wanted to get Surgery on board immediately, and to see if Surgery would admit. Spoke with Surgeon on call, who was not impressed with the results on hand. Also gave clearance for NG tube.  Medicine to admit, surgery to se patient in the AM.   Varney Biles, MD 09/24/13 [phone]

## 2013-09-21 NOTE — ED Notes (Signed)
Pt arrived to the ED with a complaint of chest pain and associated abdominal pain.  Pt started to hurt yesterday.  Pain is located in the central chest with minimal radiation to the left and right rib area.  Pt states pain is like a heartburn type of pain.  Pt is nauseated, with shortness of breath.

## 2013-09-21 NOTE — ED Notes (Signed)
Pt states she took 2 xanax prior to arrival.  Pt seems to be extremely anxious

## 2013-09-22 ENCOUNTER — Inpatient Hospital Stay (HOSPITAL_COMMUNITY): Payer: Medicare HMO

## 2013-09-22 ENCOUNTER — Encounter (HOSPITAL_COMMUNITY): Payer: Self-pay

## 2013-09-22 DIAGNOSIS — R10819 Abdominal tenderness, unspecified site: Secondary | ICD-10-CM

## 2013-09-22 DIAGNOSIS — K560 Paralytic ileus: Secondary | ICD-10-CM | POA: Diagnosis present

## 2013-09-22 DIAGNOSIS — R1013 Epigastric pain: Secondary | ICD-10-CM

## 2013-09-22 DIAGNOSIS — R740 Nonspecific elevation of levels of transaminase and lactic acid dehydrogenase [LDH]: Secondary | ICD-10-CM

## 2013-09-22 DIAGNOSIS — M47812 Spondylosis without myelopathy or radiculopathy, cervical region: Secondary | ICD-10-CM | POA: Diagnosis present

## 2013-09-22 DIAGNOSIS — E669 Obesity, unspecified: Secondary | ICD-10-CM | POA: Diagnosis present

## 2013-09-22 DIAGNOSIS — R197 Diarrhea, unspecified: Secondary | ICD-10-CM

## 2013-09-22 DIAGNOSIS — R198 Other specified symptoms and signs involving the digestive system and abdomen: Secondary | ICD-10-CM

## 2013-09-22 DIAGNOSIS — M797 Fibromyalgia: Secondary | ICD-10-CM | POA: Insufficient documentation

## 2013-09-22 DIAGNOSIS — R7401 Elevation of levels of liver transaminase levels: Secondary | ICD-10-CM | POA: Diagnosis present

## 2013-09-22 DIAGNOSIS — R109 Unspecified abdominal pain: Secondary | ICD-10-CM

## 2013-09-22 DIAGNOSIS — K5909 Other constipation: Secondary | ICD-10-CM | POA: Diagnosis present

## 2013-09-22 HISTORY — DX: Spondylosis without myelopathy or radiculopathy, cervical region: M47.812

## 2013-09-22 LAB — HEPATIC FUNCTION PANEL
ALT: 84 U/L — ABNORMAL HIGH (ref 0–35)
AST: 63 U/L — ABNORMAL HIGH (ref 0–37)
Albumin: 4.5 g/dL (ref 3.5–5.2)
Alkaline Phosphatase: 197 U/L — ABNORMAL HIGH (ref 39–117)
Bilirubin, Direct: <0.2 mg/dL (ref 0.0–0.3)
Total Bilirubin: 0.2 mg/dL — ABNORMAL LOW (ref 0.3–1.2)
Total Protein: 7.6 g/dL (ref 6.0–8.3)

## 2013-09-22 LAB — TSH: TSH: 12.41 u[IU]/mL — ABNORMAL HIGH (ref 0.350–4.500)

## 2013-09-22 LAB — I-STAT CG4 LACTIC ACID, ED: LACTIC ACID, VENOUS: 0.59 mmol/L (ref 0.5–2.2)

## 2013-09-22 MED ORDER — HEPARIN SODIUM (PORCINE) 5000 UNIT/ML IJ SOLN
5000.0000 [IU] | Freq: Three times a day (TID) | INTRAMUSCULAR | Status: DC
  Administered 2013-09-22 – 2013-09-25 (×10): 5000 [IU] via SUBCUTANEOUS
  Filled 2013-09-22 (×13): qty 1

## 2013-09-22 MED ORDER — DOCUSATE SODIUM 100 MG PO CAPS
100.0000 mg | ORAL_CAPSULE | Freq: Two times a day (BID) | ORAL | Status: DC
  Administered 2013-09-22 – 2013-09-25 (×7): 100 mg via ORAL

## 2013-09-22 MED ORDER — QUETIAPINE FUMARATE 400 MG PO TABS
800.0000 mg | ORAL_TABLET | Freq: Every day | ORAL | Status: DC
  Administered 2013-09-22 – 2013-09-24 (×3): 800 mg via ORAL
  Filled 2013-09-22 (×4): qty 2

## 2013-09-22 MED ORDER — VENLAFAXINE HCL ER 75 MG PO CP24
75.0000 mg | ORAL_CAPSULE | Freq: Three times a day (TID) | ORAL | Status: DC
  Administered 2013-09-22 – 2013-09-25 (×10): 75 mg via ORAL
  Filled 2013-09-22 (×12): qty 1

## 2013-09-22 MED ORDER — IOHEXOL 300 MG/ML  SOLN
100.0000 mL | Freq: Once | INTRAMUSCULAR | Status: AC | PRN
  Administered 2013-09-22: 100 mL via INTRAVENOUS

## 2013-09-22 MED ORDER — ALPRAZOLAM 1 MG PO TABS
2.0000 mg | ORAL_TABLET | Freq: Three times a day (TID) | ORAL | Status: DC | PRN
  Administered 2013-09-22: 1 mg via ORAL
  Administered 2013-09-22 – 2013-09-25 (×6): 2 mg via ORAL
  Filled 2013-09-22 (×7): qty 2

## 2013-09-22 MED ORDER — ONDANSETRON HCL 4 MG/2ML IJ SOLN
4.0000 mg | Freq: Four times a day (QID) | INTRAMUSCULAR | Status: DC | PRN
  Administered 2013-09-22 (×2): 4 mg via INTRAVENOUS
  Filled 2013-09-22 (×2): qty 2

## 2013-09-22 MED ORDER — MORPHINE SULFATE 4 MG/ML IJ SOLN
4.0000 mg | Freq: Once | INTRAMUSCULAR | Status: AC
  Administered 2013-09-22: 4 mg via INTRAVENOUS
  Filled 2013-09-22: qty 1

## 2013-09-22 MED ORDER — MORPHINE SULFATE 2 MG/ML IJ SOLN
1.0000 mg | INTRAMUSCULAR | Status: DC | PRN
  Administered 2013-09-22 – 2013-09-24 (×6): 1 mg via INTRAVENOUS
  Filled 2013-09-22 (×6): qty 1

## 2013-09-22 MED ORDER — DIPHENHYDRAMINE HCL 25 MG PO CAPS
25.0000 mg | ORAL_CAPSULE | Freq: Four times a day (QID) | ORAL | Status: DC | PRN
  Administered 2013-09-22: 25 mg via ORAL
  Filled 2013-09-22: qty 1

## 2013-09-22 MED ORDER — POLYETHYLENE GLYCOL 3350 17 G PO PACK
17.0000 g | PACK | Freq: Every day | ORAL | Status: DC
  Administered 2013-09-22 – 2013-09-24 (×3): 17 g via ORAL

## 2013-09-22 MED ORDER — SODIUM CHLORIDE 0.9 % IV SOLN
INTRAVENOUS | Status: DC
  Administered 2013-09-22: 19:00:00 via INTRAVENOUS
  Administered 2013-09-22: 75 mL/h via INTRAVENOUS
  Administered 2013-09-23: 09:00:00 via INTRAVENOUS

## 2013-09-22 MED ORDER — MAGIC MOUTHWASH
15.0000 mL | Freq: Four times a day (QID) | ORAL | Status: DC | PRN
  Filled 2013-09-22: qty 15

## 2013-09-22 MED ORDER — GABAPENTIN 300 MG PO CAPS
600.0000 mg | ORAL_CAPSULE | Freq: Three times a day (TID) | ORAL | Status: DC
  Administered 2013-09-22 – 2013-09-25 (×10): 600 mg via ORAL
  Filled 2013-09-22 (×12): qty 2

## 2013-09-22 MED ORDER — ALPRAZOLAM 1 MG PO TABS
1.0000 mg | ORAL_TABLET | Freq: Three times a day (TID) | ORAL | Status: DC | PRN
  Administered 2013-09-22: 1 mg via ORAL
  Filled 2013-09-22: qty 1

## 2013-09-22 MED ORDER — LIP MEDEX EX OINT
1.0000 "application " | TOPICAL_OINTMENT | Freq: Two times a day (BID) | CUTANEOUS | Status: DC
  Administered 2013-09-22 – 2013-09-24 (×6): 1 via TOPICAL
  Filled 2013-09-22 (×3): qty 7

## 2013-09-22 MED ORDER — METOCLOPRAMIDE HCL 5 MG/ML IJ SOLN
5.0000 mg | Freq: Three times a day (TID) | INTRAMUSCULAR | Status: DC
  Administered 2013-09-22 – 2013-09-24 (×7): 5 mg via INTRAVENOUS
  Filled 2013-09-22 (×2): qty 2
  Filled 2013-09-22: qty 1
  Filled 2013-09-22 (×2): qty 2
  Filled 2013-09-22 (×5): qty 1

## 2013-09-22 MED ORDER — SODIUM CHLORIDE 0.9 % IV BOLUS (SEPSIS): 1000.0000 mL | Freq: Once | INTRAVENOUS | Status: DC

## 2013-09-22 MED ORDER — SODIUM CHLORIDE 0.9 % IV BOLUS (SEPSIS)
1000.0000 mL | Freq: Once | INTRAVENOUS | Status: AC
  Administered 2013-09-22: 1000 mL via INTRAVENOUS

## 2013-09-22 MED ORDER — TRAMADOL HCL 50 MG PO TABS
100.0000 mg | ORAL_TABLET | Freq: Four times a day (QID) | ORAL | Status: DC | PRN
  Administered 2013-09-22 – 2013-09-24 (×4): 100 mg via ORAL
  Filled 2013-09-22 (×4): qty 2

## 2013-09-22 MED ORDER — ALUM & MAG HYDROXIDE-SIMETH 200-200-20 MG/5ML PO SUSP
30.0000 mL | Freq: Four times a day (QID) | ORAL | Status: DC | PRN
  Administered 2013-09-25: 30 mL via ORAL
  Filled 2013-09-22: qty 30

## 2013-09-22 MED ORDER — MORPHINE SULFATE 4 MG/ML IJ SOLN
4.0000 mg | INTRAMUSCULAR | Status: DC | PRN
Start: 2013-09-22 — End: 2013-09-22
  Administered 2013-09-22 (×2): 4 mg via INTRAVENOUS
  Filled 2013-09-22 (×2): qty 1

## 2013-09-22 MED ORDER — SACCHAROMYCES BOULARDII 250 MG PO CAPS
250.0000 mg | ORAL_CAPSULE | Freq: Two times a day (BID) | ORAL | Status: DC
  Administered 2013-09-22 – 2013-09-24 (×6): 250 mg via ORAL
  Filled 2013-09-22 (×8): qty 1

## 2013-09-22 NOTE — Progress Notes (Signed)
Utilization review completed.  

## 2013-09-22 NOTE — ED Notes (Signed)
Lactic Acid given to Dr. Nanavati. 

## 2013-09-22 NOTE — Consult Note (Signed)
Akins, MD, [address] [place], [place] [address] Phone: [phone] FAX: [place]  [birth year] [phone]  CARE TEAM:  PCP: Mayra Neer, MD  Outpatient Care Team: Patient Care Team: Mayra Neer, MD as PCP - General (Family Medicine) Cleotis Nipper, MD as Consulting Physician (Gastroenterology)  Inpatient Treatment Team: Treatment Team: Attending Provider: Etta Quill, DO; Registered Nurse: Carilyn Goodpasture, RN; Consulting Physician: Nolon Nations, MD; Registered Nurse: Daylene Posey, RN; Rounding Team: Suzan Garibaldi, MD  This patient is a 53 y.o.female who presents today for surgical evaluation at the request of Dr Virgil Benedict.   Reason for evaluation: Abdominal pain  Morbidly obese female with history of fibromyalgia and depression/anxiety.  History of paraesophageal hiatal hernia repair by me laparoscopically 2009.  No surgery since.  Has intermittent abdominal pains.  Usually not severe.  Struggles with constipation.  Followed by Dr. Cristina Gong w [facility].  Started on MiraLAX a few months ago.  Takes it twice a day.  Occasionally takes Dulcolax as well.  Takes tramadol for headaches as well.  Still has constipation issues.  Over the past week, has had some worsening bloating and cramping pain.  Felt more intense pain yesterday After eating a chicken enchilada.  Was afraid she was having a heart attack.  Did have a lot of loose bowel movements. Came to the emergency room.  No fever.  White count normal.  CT scan showed some dilated stomach and colon concerning for adynamic ileus.  Increased abdominal pain.  Lactate 3.   Concern for surgical abdomen.  Surgical consultation requested.  Initially they were concerned with her diagnosis of Barrett's esophagus that was a contraindication to nasogastric tube placement.  Eventually after going through the chart and talking to the ED  I able to help them get a better history.  Patient has had intermittent abdominal & back pains.  Takes TUMS/Gas-X without much help.  She can belch but not vomit.  She has been taking diet shakes 2 in the morning and a healthy processed meal for lunch.  She claims she is trying to lose weight.  Denies any sick contacts or travel history.  No real dysphagia to solids and liquids as best I can gather.  No really exertional chest pain or arm/jaw pain.  No personal nor family history of GI/colon cancer, inflammatory bowel disease, irritable bowel syndrome, allergy such as Celiac Sprue, dietary/dairy problems, colitis, ulcers nor gastritis.  No recent sick contacts/gastroenteritis.  No travel outside the country.  No changes in diet.  No hematochezia, hematemesis, coffee ground emesis.  No evidence of prior gastric/peptic ulceration.    Past Medical History  Diagnosis Date  . Fibromyalgia   . Spinal stenosis   . Panic attack   . Barrett esophagus   . GERD (gastroesophageal reflux disease)   . Paraesophageal hiatal hernia     repaired 2009    Past Surgical History  Procedure Laterality Date  . Tubal ligation    . Cholecystectomy    . Abdominal hysterectomy    . Ankle surgery    . Laparoscopic paraesophageal hernia repair  03/05/2008    Dr Johney Maine  . Laparoscopic nissen fundoplication  [id number]/8502    History   Social History  . Marital Status: Married    Spouse Name: N/A    Number of Children: N/A  . Years of Education: N/A  Occupational History  . Not on file.   Social History Main Topics  . Smoking status: Never Smoker   . Smokeless tobacco: Never Used  . Alcohol Use: No  . Drug Use: No  . Sexual Activity: Not on file   Other Topics Concern  . Not on file   Social History Narrative  . No narrative on file    History reviewed. No pertinent family history.  Current Facility-Administered Medications  Medication Dose Route Frequency Provider Last Rate Last Dose  . 0.9 %   sodium chloride infusion   Intravenous Continuous Etta Quill, DO 75 mL/hr at 09/22/13 0604 75 mL/hr at 09/22/13 0604  . ALPRAZolam Duanne Moron) tablet 1 mg  1 mg Oral TID PRN Etta Quill, DO   1 mg at 09/22/13 0556  . alum & mag hydroxide-simeth (MAALOX/MYLANTA) 200-200-20 MG/5ML suspension 30 mL  30 mL Oral Q6H PRN Adin Hector, MD      . gabapentin (NEURONTIN) capsule 600 mg  600 mg Oral TID Etta Quill, DO      . heparin injection 5,000 Units  5,000 Units Subcutaneous 3 times per day Etta Quill, DO   5,000 Units at 09/22/13 1540  . lip balm (CARMEX) ointment 1 application  1 application Topical BID Adin Hector, MD   1 application at 08/67/61 [phone]  . magic mouthwash  15 mL Oral QID PRN Adin Hector, MD      . morphine 4 MG/ML injection 4 mg  4 mg Intravenous Q4H PRN Etta Quill, DO      . QUEtiapine (SEROQUEL) tablet 800 mg  800 mg Oral QHS Jared M Gardner, DO      . saccharomyces boulardii (FLORASTOR) capsule 250 mg  250 mg Oral BID Adin Hector, MD   250 mg at 09/22/13 0600  . venlafaxine XR (EFFEXOR-XR) 24 hr capsule 75 mg  75 mg Oral TID Etta Quill, DO         Allergies  Allergen Reactions  . Codeine Nausea And Vomiting  . Sulfa Antibiotics     Unknown reaction.    ROS: Constitutional:  No fevers, chills, sweats.  Weight stable Eyes:  No vision changes, No discharge HENT:  No sore throats, nasal drainage Lymph: No neck swelling, No bruising easily Pulmonary:  No cough, productive sputum CV: No orthopnea, PND  Patient walks 15 minutes for about 1/4 miles without difficulty.  No exertional chest/neck/shoulder/arm pain. GI:  No personal nor family history of GI/colon cancer, inflammatory bowel disease, irritable bowel syndrome, allergy such as Celiac Sprue, dietary/dairy problems, colitis, ulcers nor gastritis.  No recent sick contacts/gastroenteritis.  No travel outside the country.  No changes in diet. Renal: No UTIs, No hematuria Genital:  No  drainage, bleeding, masses Musculoskeletal: No severe joint pain.  Good ROM major joints Skin:  No sores or lesions.  No rashes Heme/Lymph:  No easy bleeding.  No swollen lymph nodes Neuro: No focal weakness/numbness.  No seizures Psych: No suicidal ideation.  No hallucinations  BP 125/85  Pulse 74  Temp(Src) 97.6 F (36.4 C)  Resp 16  Wt 186 lb 1.1 oz (84.4 kg)  SpO2 97%  Physical Exam: General: Pt awake/alert/oriented x4 in no major acute distress Eyes: PERRL, normal EOM. Sclera nonicteric Neuro: CN II-XII intact w/o focal sensory/motor deficits. Lymph: No head/neck/groin lymphadenopathy Psych:  No delerium/psychosis/paranoia.  Depressed.  Mildly anxious but consolable HENT: Normocephalic, Mucus membranes moist.  No thrush Neck: Supple, No  tracheal deviation Chest: No pain.  Good respiratory excursion. CV:  Pulses intact.  Regular rhythm Abdomen: Soft, obese.  Nondistended.  Min tender to palpation.  No incarcerated hernias. Ext:  SCDs BLE.  No significant edema.  No cyanosis Skin: No petechiae / purpurea.  No major sores Musculoskeletal: No severe joint pain.  Good ROM major joints   Results:   Labs: Results for orders placed during the hospital encounter of 09/21/13 (from the past 48 hour(s))  CBC     Status: None   Collection Time    09/21/13 10:15 PM      Result Value Ref Range   WBC 7.1  4.0 - 10.5 K/uL   RBC 4.69  3.87 - 5.11 MIL/uL   Hemoglobin 14.5  12.0 - 15.0 g/dL   HCT 43.2  36.0 - 46.0 %   MCV 92.1  78.0 - 100.0 fL   MCH 30.9  26.0 - 34.0 pg   MCHC 33.6  30.0 - 36.0 g/dL   RDW 12.9  11.5 - 15.5 %   Platelets 271  150 - 400 K/uL  BASIC METABOLIC PANEL     Status: Abnormal   Collection Time    09/21/13 10:15 PM      Result Value Ref Range   Sodium 143  137 - 147 mEq/L   Potassium 3.9  3.7 - 5.3 mEq/L   Chloride 101  96 - 112 mEq/L   CO2 26  19 - 32 mEq/L   Glucose, Bld 77  70 - 99 mg/dL   BUN 12  6 - 23 mg/dL   Creatinine, Ser 0.96  0.50 - 1.10  mg/dL   Calcium 10.4  8.4 - 10.5 mg/dL   GFR calc non Af Amer 66 (*) >90 mL/min   GFR calc Af Amer 76 (*) >90 mL/min   Comment: (NOTE)     The eGFR has been calculated using the CKD EPI equation.     This calculation has not been validated in all clinical situations.     eGFR's persistently <90 mL/min signify possible Chronic Kidney     Disease.  LIPASE, BLOOD     Status: None   Collection Time    09/21/13 10:15 PM      Result Value Ref Range   Lipase 32  11 - 59 U/L  HEPATIC FUNCTION PANEL     Status: Abnormal   Collection Time    09/21/13 10:15 PM      Result Value Ref Range   Total Protein 7.6  6.0 - 8.3 g/dL   Albumin 4.5  3.5 - 5.2 g/dL   AST 63 (*) 0 - 37 U/L   ALT 84 (*) 0 - 35 U/L   Alkaline Phosphatase 197 (*) 39 - 117 U/L   Total Bilirubin 0.2 (*) 0.3 - 1.2 mg/dL   Bilirubin, Direct <0.2  0.0 - 0.3 mg/dL   Indirect Bilirubin NOT CALCULATED  0.3 - 0.9 mg/dL  Randolm Idol, ED     Status: None   Collection Time    09/21/13 11:20 PM      Result Value Ref Range   Troponin i, poc 0.00  0.00 - 0.08 ng/mL   Comment 3            Comment: Due to the release kinetics of cTnI,     a negative result within the first hours     of the onset of symptoms does not rule out     myocardial infarction  with certainty.     If myocardial infarction is still suspected,     repeat the test at appropriate intervals.  I-STAT CHEM 8, ED     Status: Abnormal   Collection Time    09/21/13 11:51 PM      Result Value Ref Range   Sodium 141  137 - 147 mEq/L   Potassium 3.5 (*) 3.7 - 5.3 mEq/L   Chloride 102  96 - 112 mEq/L   BUN 12  6 - 23 mg/dL   Creatinine, Ser 1.00  0.50 - 1.10 mg/dL   Glucose, Bld 72  70 - 99 mg/dL   Calcium, Ion 1.23  1.12 - 1.23 mmol/L   TCO2 27  0 - 100 mmol/L   Hemoglobin 15.3 (*) 12.0 - 15.0 g/dL   HCT 45.0  36.0 - 46.0 %  I-STAT CG4 LACTIC ACID, ED     Status: Abnormal   Collection Time    09/21/13 11:59 PM      Result Value Ref Range   Lactic Acid,  Venous 3.10 (*) 0.5 - 2.2 mmol/L  I-STAT CG4 LACTIC ACID, ED     Status: None   Collection Time    09/22/13  4:49 AM      Result Value Ref Range   Lactic Acid, Venous 0.59  0.5 - 2.2 mmol/L    Imaging / Studies: Ct Abdomen Pelvis W Contrast  09/22/2013   CLINICAL DATA:  Left upper quadrant abdominal pain for 2 days. Nausea.  EXAM: CT ABDOMEN AND PELVIS WITH CONTRAST  TECHNIQUE: Multidetector CT imaging of the abdomen and pelvis was performed using the standard protocol following bolus administration of intravenous contrast.  CONTRAST:  16m OMNIPAQUE IOHEXOL 300 MG/ML SOLN, 1052mOMNIPAQUE IOHEXOL 300 MG/ML SOLN  COMPARISON:  06/28/2010  FINDINGS: Minimal effusions with lung base subsegmental atelectasis. The lung bases are otherwise clear. Heart is normal in size.  Stable 15 mm low-density lesion at the dome of the liver with another noted along the anterior margin of the lateral segment of the left lobe. These are likely cysts. Liver shows antrum extrahepatic bile duct dilation, chronic in this post cholecystectomy patient. Liver is otherwise unremarkable.  Normal spleen. Surgical vascular clips lie along the posterior medial margin of the spleen. Pancreas is unremarkable. No adrenal masses.  Kidneys, ureters and bladder are unremarkable.  Uterus is surgically absent.  No pelvic masses.  No pathologically enlarged lymph nodes. No abnormal fluid collections.  Appendix is dilated to 9 mm at its tip. However, wall is not thickened and there is no adjacent inflammation. No convincing acute appendicitis.  There are air-fluid levels in the right colon. No colonic wall thickening or adjacent inflammation. Remainder of the colon is unremarkable. There is mild prominence of small bowel loops proximally with a gradual transition to decompressed distal small bowel. The stomach is distended but otherwise unremarkable. The findings suggest a mild diffuse adynamic ileus.  No significant bony abnormality.  IMPRESSION:  1. There is mild prominence of proximal small bowel, stomach distention and air-fluid levels within the small bowel and colon, findings consistent with a mild diffuse adynamic ileus. No evidence of bowel obstruction no bowel wall thickening or inflammatory changes. 2. No other evidence of an acute abnormality. Extent and minimal pleural effusions. Mild subsegmental lung base atelectasis. 3. Small liver lesions most likely cysts. Chronic intra and extrahepatic bile duct dilation. Status post cholecystectomy.   Electronically Signed   By: DaLajean Manes.D.   On: 09/22/2013  00:47   Dg Chest Port 1 View  09/21/2013   CLINICAL DATA:  CHEST PAIN ABDOMINAL PAIN  EXAM: PORTABLE CHEST - 1 VIEW  COMPARISON:  Prior radiograph from 09/29/2009  FINDINGS: The cardiac and mediastinal silhouettes are stable in size and contour, and remain within normal limits.  The lungs are mildly hypoinflated. No airspace consolidation, pleural effusion, or pulmonary edema is identified. There is no pneumothorax.  No acute osseous abnormality identified.  IMPRESSION: No acute cardiopulmonary abnormality.   Electronically Signed   By: Jeannine Boga M.D.   On: 09/21/2013 23:10   Dg Abd Acute W/chest  09/22/2013   CLINICAL DATA:  Upper abdominal pain.  Rule out free air.  EXAM: ACUTE ABDOMEN SERIES (ABDOMEN 2 VIEW & CHEST 1 VIEW)  COMPARISON:  Abdominal CT from earlier the same day  FINDINGS: Normal progression of enteric contrast, now in the proximal transverse colon. There is a moderate volume of colonic gas, but no evidence of obstruction. No dilated bowel to suggest significant ileus. No pneumoperitoneum. Mild atelectatic changes at the bases. Trace pleural fluid seen on the previous CT is not clearly seen. Normal heart size.  IMPRESSION: Negative for bowel obstruction or perforation.   Electronically Signed   By: Jorje Guild M.D.   On: 09/22/2013 04:35    Medications / Allergies: per chart  Antibiotics: Anti-infectives    None      Assessment  Leslie Benson  53 y.o. female       Problem List:  Active Problems:   Adynamic ileus   Obesity (BMI 30-39.9)   Abdominal pain, epigastric   Transaminitis   Cervical arthritis   Abdominal pain and diarrhea of uncertain etiology.  Plan:  I do not think she has a surgical abdomen.  Her CAT scan shows evidence of bowel obstruction free air perforation.  She does not have recurrent hiatal hernia.  The enteral contrast has moved into her colon in a few hours on a followup x-ray.  Plenty of colonic gas.  Argues against ileus.  No free air/perforationLooks rather normal to me.  I discussed with radiology.  Dr. Pascal Lux agrees.  She had a lactate elevated at 3 and then 4 hours later is completely normal.  She does not look toxic.  She does not have peritonitis.  I suspect she has a component of delayed gastric emptying which could explain her postprandial bloating and chronic constipation.  Worsened by her use for narcotics.  History back tissues and fibromyalgia perhaps contributing to it as well.  She could benefit from metoclopramide if this is confirmed.  Would be helpful to get a gastric emptying study off narcotics to get an accurate assessment.  I do not know if that sort workup has been done by her gastroenterologist over at [facility].  She could benefit from outpatient GI evaluation.  If she markedly declines this admission, could consider inpatient consultation by GI them as well.  Without any major gastric distention or proof of obstruction, I do not think a nasogastric tube is strongly needed at this time.  She is refusing her crit now anyway.  I defe cardiopulmonary workup to medicine.  Troponin was normal.  CXR looks underwhelming.  I cannot find an EKG but seems that was evaluated in the ER and that was normal as well.  I do not see any acute surgical issues per week and followup during her admission to make sure she is not declined.  The fact that her vital  signs  are stable and her films and labs have improved currently is against an acute issue  -VTE prophylaxis- SCDs, etc  -mobilize as tolerated to help recovery    Adin Hector, M.D., F.A.C.S. Gastrointestinal and Minimally Invasive Surgery [facility] [address], [place],  [address] [phone] Main / Paging   09/22/2013  Note: This dictation was prepared with Dragon/digital dictation along with [facility] technology. Any transcriptional errors that result from this process are unintentional.

## 2013-09-22 NOTE — Progress Notes (Signed)
Pt refuses gastric tube.[employer].Pt wants to continue to take po meds.No nausea,vomiting since arrival to floor.VSS.Lbm 5/31 loose light brown stool at home.Daylene Posey

## 2013-09-22 NOTE — ED Notes (Signed)
Pt is in CT and will get lactic acid.

## 2013-09-22 NOTE — H&P (Addendum)
[facility] History and Physical  Leslie Benson GMW:[id number] DOB: Jul 14, 1960 DOA: 09/21/2013  Referring physician: EDP PCP: Mayra Neer, MD   Chief Complaint: Abdominal pain   HPI: Leslie Benson is a 53 y.o. female h/o Nissen Fundoplication in 4403 for GERD and Barrett's esophagus, presents to ED with acute on chronic abdominal pain.  Pain onset yesterday, associated with nausea but no emesis (hasnt ever vomited since nissen she states), pain is constant and severe.  Worst in the epigastric area but now diffuse across the abdomen.  Has had 6 episodes of loose BMs today, non bloody.  Review of Systems: Systems reviewed.  As above, otherwise negative  Past Medical History  Diagnosis Date  . Fibromyalgia   . Spinal stenosis   . Panic attack    Past Surgical History  Procedure Laterality Date  . Tubal ligation    . Cholecystectomy    . Abdominal hysterectomy    . Ankle surgery     Social History:  reports that she has never smoked. She has never used smokeless tobacco. She reports that she does not drink alcohol or use illicit drugs.  Allergies  Allergen Reactions  . Codeine Nausea And Vomiting  . Sulfa Antibiotics     Unknown reaction.    History reviewed. No pertinent family history.   Prior to Admission medications   Medication Sig Start Date End Date Taking? Authorizing Provider  ALPRAZolam Duanne Moron) 1 MG tablet Take 1 tablet (1 mg total) by mouth 3 (three) times daily as needed for anxiety (anxiety). 06/20/12  Yes Virgel Manifold, MD  gabapentin (NEURONTIN) 600 MG tablet Take 1 tablet (600 mg total) by mouth 3 (three) times daily. 06/20/12  Yes Virgel Manifold, MD  naproxen sodium (ANAPROX) 550 MG tablet Take 550 mg by mouth 2 (two) times daily as needed for moderate pain.  09/09/13  Yes Historical Provider, MD  QUEtiapine (SEROQUEL) 400 MG tablet Take 800 mg by mouth at bedtime.   Yes Historical Provider, MD  traMADol (ULTRAM) 50 MG tablet Take 2 tablets (100  mg total) by mouth every 6 (six) hours as needed for pain. 06/20/12  Yes Virgel Manifold, MD  venlafaxine XR (EFFEXOR-XR) 75 MG 24 hr capsule Take 75 mg by mouth 3 (three) times daily.   Yes Historical Provider, MD   Physical Exam: Filed Vitals:   09/22/13 0300  BP: 114/71  Pulse: 83  Resp: 15    BP 114/71  Pulse 83  Resp 15  SpO2 94%  General Appearance:    Alert, oriented, no distress, appears stated age  Head:    Normocephalic, atraumatic  Eyes:    PERRL, EOMI, sclera non-icteric        Nose:   Nares without drainage or epistaxis. Mucosa, turbinates normal  Throat:   Moist mucous membranes. Oropharynx without erythema or exudate.  Neck:   Supple. No carotid bruits.  No thyromegaly.  No lymphadenopathy.   Back:     No CVA tenderness, no spinal tenderness  Lungs:     Clear to auscultation bilaterally, without wheezes, rhonchi or rales  Chest wall:    No tenderness to palpitation  Heart:    Regular rate and rhythm without murmurs, gallops, rubs  Abdomen:     Distended, guarding, rebound tenderness noted on the R side, diminished bowel sounds.  Genitalia:    deferred  Rectal:    deferred  Extremities:   No clubbing, cyanosis or edema.  Pulses:   2+ and symmetric all extremities  Skin:   Skin color, texture, turgor normal, no rashes or lesions  Lymph nodes:   Cervical, supraclavicular, and axillary nodes normal  Neurologic:   CNII-XII intact. Normal strength, sensation and reflexes      throughout    Labs on Admission:  Basic Metabolic Panel:  Recent Labs Lab 09/21/13 2215 09/21/13 2351  NA 143 141  K 3.9 3.5*  CL 101 102  CO2 26  --   GLUCOSE 77 72  BUN 12 12  CREATININE 0.96 1.00  CALCIUM 10.4  --    Liver Function Tests:  Recent Labs Lab 09/21/13 2215  AST 63*  ALT 84*  ALKPHOS 197*  BILITOT 0.2*  PROT 7.6  ALBUMIN 4.5    Recent Labs Lab 09/21/13 2215  LIPASE 32   No results found for this basename: AMMONIA,  in the last 168 hours CBC:  Recent  Labs Lab 09/21/13 2215 09/21/13 2351  WBC 7.1  --   HGB 14.5 15.3*  HCT 43.2 45.0  MCV 92.1  --   PLT 271  --    Cardiac Enzymes: No results found for this basename: CKTOTAL, CKMB, CKMBINDEX, TROPONINI,  in the last 168 hours  BNP (last 3 results) No results found for this basename: PROBNP,  in the last 8760 hours CBG: No results found for this basename: GLUCAP,  in the last 168 hours  Radiological Exams on Admission: Ct Abdomen Pelvis W Contrast  09/22/2013   CLINICAL DATA:  Left upper quadrant abdominal pain for 2 days. Nausea.  EXAM: CT ABDOMEN AND PELVIS WITH CONTRAST  TECHNIQUE: Multidetector CT imaging of the abdomen and pelvis was performed using the standard protocol following bolus administration of intravenous contrast.  CONTRAST:  61mL OMNIPAQUE IOHEXOL 300 MG/ML SOLN, 181mL OMNIPAQUE IOHEXOL 300 MG/ML SOLN  COMPARISON:  06/28/2010  FINDINGS: Minimal effusions with lung base subsegmental atelectasis. The lung bases are otherwise clear. Heart is normal in size.  Stable 15 mm low-density lesion at the dome of the liver with another noted along the anterior margin of the lateral segment of the left lobe. These are likely cysts. Liver shows antrum extrahepatic bile duct dilation, chronic in this post cholecystectomy patient. Liver is otherwise unremarkable.  Normal spleen. Surgical vascular clips lie along the posterior medial margin of the spleen. Pancreas is unremarkable. No adrenal masses.  Kidneys, ureters and bladder are unremarkable.  Uterus is surgically absent.  No pelvic masses.  No pathologically enlarged lymph nodes. No abnormal fluid collections.  Appendix is dilated to 9 mm at its tip. However, wall is not thickened and there is no adjacent inflammation. No convincing acute appendicitis.  There are air-fluid levels in the right colon. No colonic wall thickening or adjacent inflammation. Remainder of the colon is unremarkable. There is mild prominence of small bowel loops  proximally with a gradual transition to decompressed distal small bowel. The stomach is distended but otherwise unremarkable. The findings suggest a mild diffuse adynamic ileus.  No significant bony abnormality.  IMPRESSION: 1. There is mild prominence of proximal small bowel, stomach distention and air-fluid levels within the small bowel and colon, findings consistent with a mild diffuse adynamic ileus. No evidence of bowel obstruction no bowel wall thickening or inflammatory changes. 2. No other evidence of an acute abnormality. Extent and minimal pleural effusions. Mild subsegmental lung base atelectasis. 3. Small liver lesions most likely cysts. Chronic intra and extrahepatic bile duct dilation. Status post cholecystectomy.   Electronically Signed   By: Lajean Manes  M.D.   On: 09/22/2013 00:47   Dg Chest Port 1 View  09/21/2013   CLINICAL DATA:  CHEST PAIN ABDOMINAL PAIN  EXAM: PORTABLE CHEST - 1 VIEW  COMPARISON:  Prior radiograph from 09/29/2009  FINDINGS: The cardiac and mediastinal silhouettes are stable in size and contour, and remain within normal limits.  The lungs are mildly hypoinflated. No airspace consolidation, pleural effusion, or pulmonary edema is identified. There is no pneumothorax.  No acute osseous abnormality identified.  IMPRESSION: No acute cardiopulmonary abnormality.   Electronically Signed   By: Jeannine Boga M.D.   On: 09/21/2013 23:10    EKG: Independently reviewed.  Assessment/Plan Active Problems:   Adynamic ileus   Obesity (BMI 30-39.9)   Abdominal pain, epigastric   Transaminitis   1. Adynamic ileus - Patient with findings of adynamic ileus on CT scan, NPO bowel rest, IVF, morphine prn pain.  EDP spoke with Dr. Johney Maine (who was also the surgeon who did her Nissen in 2009), and he said Nissen was not a contraindication to NGT.  Have offered this and written for this for the patient, patient will consider having it put in but may end up  refusing. 2. Transaminates - monitor and recheck CMP tomorrow, patient already had cholecystectomy and no acute findings on CT regarding liver.    Code Status: Full Code  Family Communication: Husband at bedside Disposition Plan: Admit to inpatient   Time spent: 50 min  [facility] Pager [phone]  If 7AM-7PM, please contact the day team taking care of the patient [email or web address] Password [email or web address] 09/22/2013, 4:16 AM

## 2013-09-22 NOTE — Progress Notes (Signed)
Followup progress note:  Abdomen: Soft, distended, scant bowel sounds  Ileus: Did not tolerate clear liquids. On stool softener. Started Reglan. Recheck followup x-ray in the morning and if improved, will try stronger laxative.  Chronic constipation: Patient states bowels move only once a week. Not on chronic narcotics according to patient. We'll check TSH.  Obesity: Patient needs criteria with BMI of 32

## 2013-09-23 DIAGNOSIS — R946 Abnormal results of thyroid function studies: Secondary | ICD-10-CM

## 2013-09-23 DIAGNOSIS — K560 Paralytic ileus: Secondary | ICD-10-CM

## 2013-09-23 DIAGNOSIS — K5900 Constipation, unspecified: Secondary | ICD-10-CM

## 2013-09-23 LAB — T3, FREE: T3, Free: 2.1 pg/mL — ABNORMAL LOW (ref 2.3–4.2)

## 2013-09-23 LAB — T4, FREE: Free T4: 0.81 ng/dL (ref 0.80–1.80)

## 2013-09-23 NOTE — Progress Notes (Signed)
Subjective: Pt says she was experiencing pain and nausea after drinking clear liquids.  Was NPO, now advanced back to clears.  Very sleepy, but thinks she's a bit better.  C/o abdominal bloating.    Objective: Vital signs in last 24 hours: Temp:  [97.5 F (36.4 C)-97.7 F (36.5 C)] 97.5 F (36.4 C) (06/02 0537) Pulse Rate:  [69-82] 82 (06/02 0537) Resp:  [14-16] 16 (06/02 0537) BP: (80-112)/(53-77) 80/53 mmHg (06/02 0537) SpO2:  [94 %-97 %] 96 % (06/02 0537) Weight:  [185 lb 10 oz (84.2 kg)] 185 lb 10 oz (84.2 kg) (06/02 0537) Last BM Date: 09/21/13  Intake/Output from previous day: 06/01 0701 - 06/02 0700 In: 2230 [P.O.:360; I.V.:1870] Out: 2900 [Urine:2900] Intake/Output this shift:    PE: Gen:  Alert, NAD, pleasant Abd: Soft, distended, mild tenderness in the upper abdomen, +BS, no HSM   Lab Results:   Recent Labs  09/21/13 2215 09/21/13 2351  WBC 7.1  --   HGB 14.5 15.3*  HCT 43.2 45.0  PLT 271  --    BMET  Recent Labs  09/21/13 2215 09/21/13 2351  NA 143 141  K 3.9 3.5*  CL 101 102  CO2 26  --   GLUCOSE 77 72  BUN 12 12  CREATININE 0.96 1.00  CALCIUM 10.4  --    PT/INR No results found for this basename: LABPROT, INR,  in the last 72 hours CMP     Component Value Date/Time   NA 141 09/21/2013 2351   K 3.5* 09/21/2013 2351   CL 102 09/21/2013 2351   CO2 26 09/21/2013 2215   GLUCOSE 72 09/21/2013 2351   BUN 12 09/21/2013 2351   CREATININE 1.00 09/21/2013 2351   CALCIUM 10.4 09/21/2013 2215   PROT 7.6 09/21/2013 2215   ALBUMIN 4.5 09/21/2013 2215   AST 63* 09/21/2013 2215   ALT 84* 09/21/2013 2215   ALKPHOS 197* 09/21/2013 2215   BILITOT 0.2* 09/21/2013 2215   GFRNONAA 66* 09/21/2013 2215   GFRAA 76* 09/21/2013 2215   Lipase     Component Value Date/Time   LIPASE 32 09/21/2013 2215       Studies/Results: Ct Abdomen Pelvis W Contrast  09/22/2013   CLINICAL DATA:  Left upper quadrant abdominal pain for 2 days. Nausea.  EXAM: CT ABDOMEN AND  PELVIS WITH CONTRAST  TECHNIQUE: Multidetector CT imaging of the abdomen and pelvis was performed using the standard protocol following bolus administration of intravenous contrast.  CONTRAST:  17mL OMNIPAQUE IOHEXOL 300 MG/ML SOLN, 150mL OMNIPAQUE IOHEXOL 300 MG/ML SOLN  COMPARISON:  06/28/2010  FINDINGS: Minimal effusions with lung base subsegmental atelectasis. The lung bases are otherwise clear. Heart is normal in size.  Stable 15 mm low-density lesion at the dome of the liver with another noted along the anterior margin of the lateral segment of the left lobe. These are likely cysts. Liver shows antrum extrahepatic bile duct dilation, chronic in this post cholecystectomy patient. Liver is otherwise unremarkable.  Normal spleen. Surgical vascular clips lie along the posterior medial margin of the spleen. Pancreas is unremarkable. No adrenal masses.  Kidneys, ureters and bladder are unremarkable.  Uterus is surgically absent.  No pelvic masses.  No pathologically enlarged lymph nodes. No abnormal fluid collections.  Appendix is dilated to 9 mm at its tip. However, wall is not thickened and there is no adjacent inflammation. No convincing acute appendicitis.  There are air-fluid levels in the right colon. No colonic wall thickening or adjacent inflammation. Remainder  of the colon is unremarkable. There is mild prominence of small bowel loops proximally with a gradual transition to decompressed distal small bowel. The stomach is distended but otherwise unremarkable. The findings suggest a mild diffuse adynamic ileus.  No significant bony abnormality.  IMPRESSION: 1. There is mild prominence of proximal small bowel, stomach distention and air-fluid levels within the small bowel and colon, findings consistent with a mild diffuse adynamic ileus. No evidence of bowel obstruction no bowel wall thickening or inflammatory changes. 2. No other evidence of an acute abnormality. Extent and minimal pleural effusions. Mild  subsegmental lung base atelectasis. 3. Small liver lesions most likely cysts. Chronic intra and extrahepatic bile duct dilation. Status post cholecystectomy.   Electronically Signed   By: Lajean Manes M.D.   On: 09/22/2013 00:47   Dg Chest Port 1 View  09/21/2013   CLINICAL DATA:  CHEST PAIN ABDOMINAL PAIN  EXAM: PORTABLE CHEST - 1 VIEW  COMPARISON:  Prior radiograph from 09/29/2009  FINDINGS: The cardiac and mediastinal silhouettes are stable in size and contour, and remain within normal limits.  The lungs are mildly hypoinflated. No airspace consolidation, pleural effusion, or pulmonary edema is identified. There is no pneumothorax.  No acute osseous abnormality identified.  IMPRESSION: No acute cardiopulmonary abnormality.   Electronically Signed   By: Jeannine Boga M.D.   On: 09/21/2013 23:10   Dg Abd Acute W/chest  09/22/2013   CLINICAL DATA:  Upper abdominal pain.  Rule out free air.  EXAM: ACUTE ABDOMEN SERIES (ABDOMEN 2 VIEW & CHEST 1 VIEW)  COMPARISON:  Abdominal CT from earlier the same day  FINDINGS: Normal progression of enteric contrast, now in the proximal transverse colon. There is a moderate volume of colonic gas, but no evidence of obstruction. No dilated bowel to suggest significant ileus. No pneumoperitoneum. Mild atelectatic changes at the bases. Trace pleural fluid seen on the previous CT is not clearly seen. Normal heart size.  IMPRESSION: Negative for bowel obstruction or perforation.   Electronically Signed   By: Jorje Guild M.D.   On: 09/22/2013 04:35    Anti-infectives: Anti-infectives   None       Assessment/Plan Abdominal pain of uncertain etiology Diarrhea Adynamic ileus Obesity (BMI 30-39.9)  Abdominal pain, epigastric  Transaminitis  Cervical arthritis  Plan: 1.  Cont. Conservative management (IVF, pain control, antiemetics), re-try clear liquids 2.  Ambulate more and IS 3.  SCD's and heparin 4.  Multifactorial problem likely due to delayed  gastric emptying, narcotic use 5.  No acute surgical needs at this time   LOS: 2 days    Coralie Keens 09/23/2013, 7:07 AM Pager: [phone]  Agree with above.  Alphonsa Overall, MD, [facility] Pager: [phone] Office phone:  [phone]

## 2013-09-23 NOTE — Progress Notes (Addendum)
[facility] PROGRESS NOTE  Leslie Benson NFA:[id number] DOB: 06-05-1960 DOA: 09/21/2013 PCP: Mayra Neer, MD  Interim summary:  53 year old white female presents with several days of abdominal pain and found to have mild ileus. With hydration and Reglan, symptoms improving his diet advancing. She mentioned that her bowels only move about once a week, so TSH ordered and found to be elevated at 12.  Assessment/Plan: Principal Problem:   Adynamic ileus: Followup x-rays show improvement. Have started Reglan. Tolerating clear liquids. Advance diet to solid food.  Active Problems:   Obesity (BMI 30-39.9): Patient criteria BMI of 32    Abdominal pain, epigastric: Secondary to ileus. Result.    Transaminitis: Mild. Likely secondary to dehydration from poor by mouth.   Cervical arthritis: Stable.    Chronic constipation: Could be a component of hypothyroidism.   Abnormal TSH: TSH ordered because of patient's comments about only moving her bowels weekly. Elevated at 12. Free T4 and free T3 pending. Suspect she likely has underlying hypothyroid or subacute hypothyroidism likely contributing factor to her GI issues. A free T4 abnormal, recommend starting very low-dose Synthroid at 12.5 or 25 mcg with repeat TSH check in 3 months  Code Status: Full code  Family Communication: Husband at the bedside.  Disposition Plan: Home, likely tomorrow   Consultants:  General surgery  Procedures:  None  Antibiotics:  None  HPI/Subjective: Patient feeling somewhat better. Tolerating clear liquids all day.  Objective: Filed Vitals:   09/23/13 1408  BP: 103/71  Pulse: 79  Temp: 98.1 F (36.7 C)  Resp: 16    Intake/Output Summary (Last 24 hours) at 09/23/13 1759 Last data filed at 09/23/13 1734  Gross per 24 hour  Intake   2280 ml  Output   3950 ml  Net  -1670 ml   Filed Weights   09/22/13 0500 09/23/13 0537 09/23/13 1031  Weight: 84.4 kg (186 lb 1.1 oz) 84.2 kg (185 lb  10 oz) 84.052 kg (185 lb 4.8 oz)    Exam:   General:  Alert and oriented x3, no acute distress  Cardiovascular: Regular rate and rhythm, S1-S2  Respiratory: Clear to auscultation bilaterally  Abdomen: Soft, mild distention, hypoactive bowel sounds, nontender  Musculoskeletal: No clubbing cyanosis or edema   Data Reviewed: Basic Metabolic Panel:  Recent Labs Lab 09/21/13 2215 09/21/13 2351  NA 143 141  K 3.9 3.5*  CL 101 102  CO2 26  --   GLUCOSE 77 72  BUN 12 12  CREATININE 0.96 1.00  CALCIUM 10.4  --    Liver Function Tests:  Recent Labs Lab 09/21/13 2215  AST 63*  ALT 84*  ALKPHOS 197*  BILITOT 0.2*  PROT 7.6  ALBUMIN 4.5    Recent Labs Lab 09/21/13 2215  LIPASE 32   No results found for this basename: AMMONIA,  in the last 168 hours CBC:  Recent Labs Lab 09/21/13 2215 09/21/13 2351  WBC 7.1  --   HGB 14.5 15.3*  HCT 43.2 45.0  MCV 92.1  --   PLT 271  --    Cardiac Enzymes: No results found for this basename: CKTOTAL, CKMB, CKMBINDEX, TROPONINI,  in the last 168 hours BNP (last 3 results) No results found for this basename: PROBNP,  in the last 8760 hours CBG: No results found for this basename: GLUCAP,  in the last 168 hours  No results found for this or any previous visit (from the past 240 hour(s)).   Studies: Ct Abdomen Pelvis W Contrast  09/22/2013   CLINICAL DATA:  Left upper quadrant abdominal pain for 2 days. Nausea.  EXAM: CT ABDOMEN AND PELVIS WITH CONTRAST  TECHNIQUE: Multidetector CT imaging of the abdomen and pelvis was performed using the standard protocol following bolus administration of intravenous contrast.  CONTRAST:  41mL OMNIPAQUE IOHEXOL 300 MG/ML SOLN, 120mL OMNIPAQUE IOHEXOL 300 MG/ML SOLN  COMPARISON:  06/28/2010  FINDINGS: Minimal effusions with lung base subsegmental atelectasis. The lung bases are otherwise clear. Heart is normal in size.  Stable 15 mm low-density lesion at the dome of the liver with another noted  along the anterior margin of the lateral segment of the left lobe. These are likely cysts. Liver shows antrum extrahepatic bile duct dilation, chronic in this post cholecystectomy patient. Liver is otherwise unremarkable.  Normal spleen. Surgical vascular clips lie along the posterior medial margin of the spleen. Pancreas is unremarkable. No adrenal masses.  Kidneys, ureters and bladder are unremarkable.  Uterus is surgically absent.  No pelvic masses.  No pathologically enlarged lymph nodes. No abnormal fluid collections.  Appendix is dilated to 9 mm at its tip. However, wall is not thickened and there is no adjacent inflammation. No convincing acute appendicitis.  There are air-fluid levels in the right colon. No colonic wall thickening or adjacent inflammation. Remainder of the colon is unremarkable. There is mild prominence of small bowel loops proximally with a gradual transition to decompressed distal small bowel. The stomach is distended but otherwise unremarkable. The findings suggest a mild diffuse adynamic ileus.  No significant bony abnormality.  IMPRESSION: 1. There is mild prominence of proximal small bowel, stomach distention and air-fluid levels within the small bowel and colon, findings consistent with a mild diffuse adynamic ileus. No evidence of bowel obstruction no bowel wall thickening or inflammatory changes. 2. No other evidence of an acute abnormality. Extent and minimal pleural effusions. Mild subsegmental lung base atelectasis. 3. Small liver lesions most likely cysts. Chronic intra and extrahepatic bile duct dilation. Status post cholecystectomy.   Electronically Signed   By: Lajean Manes M.D.   On: 09/22/2013 00:47   Dg Chest Port 1 View  09/21/2013   CLINICAL DATA:  CHEST PAIN ABDOMINAL PAIN  EXAM: PORTABLE CHEST - 1 VIEW  COMPARISON:  Prior radiograph from 09/29/2009  FINDINGS: The cardiac and mediastinal silhouettes are stable in size and contour, and remain within normal limits.   The lungs are mildly hypoinflated. No airspace consolidation, pleural effusion, or pulmonary edema is identified. There is no pneumothorax.  No acute osseous abnormality identified.  IMPRESSION: No acute cardiopulmonary abnormality.   Electronically Signed   By: Jeannine Boga M.D.   On: 09/21/2013 23:10   Dg Abd Acute W/chest  09/22/2013   CLINICAL DATA:  Upper abdominal pain.  Rule out free air.  EXAM: ACUTE ABDOMEN SERIES (ABDOMEN 2 VIEW & CHEST 1 VIEW)  COMPARISON:  Abdominal CT from earlier the same day  FINDINGS: Normal progression of enteric contrast, now in the proximal transverse colon. There is a moderate volume of colonic gas, but no evidence of obstruction. No dilated bowel to suggest significant ileus. No pneumoperitoneum. Mild atelectatic changes at the bases. Trace pleural fluid seen on the previous CT is not clearly seen. Normal heart size.  IMPRESSION: Negative for bowel obstruction or perforation.   Electronically Signed   By: Jorje Guild M.D.   On: 09/22/2013 04:35    Scheduled Meds: . docusate sodium  100 mg Oral BID  . gabapentin  600 mg Oral  TID  . heparin  5,000 Units Subcutaneous 3 times per day  . lip balm  1 application Topical BID  . metoCLOPramide (REGLAN) injection  5 mg Intravenous 3 times per day  . polyethylene glycol  17 g Oral Daily  . QUEtiapine  800 mg Oral QHS  . saccharomyces boulardii  250 mg Oral BID  . venlafaxine XR  75 mg Oral TID   Continuous Infusions:   Principal Problem:   Adynamic ileus Active Problems:   Obesity (BMI 30-39.9)   Abdominal pain, epigastric   Transaminitis   Cervical arthritis   Chronic constipation   Abnormal TSH    Time spent: 15 minutes    [facility] Pager [phone]. If 7PM-7AM, please contact night-coverage at [email or web address], password [facility] 09/23/2013, 5:59 PM  LOS: 2 days

## 2013-09-24 ENCOUNTER — Encounter (HOSPITAL_COMMUNITY): Payer: Self-pay | Admitting: *Deleted

## 2013-09-24 DIAGNOSIS — M47812 Spondylosis without myelopathy or radiculopathy, cervical region: Secondary | ICD-10-CM

## 2013-09-24 DIAGNOSIS — R7401 Elevation of levels of liver transaminase levels: Secondary | ICD-10-CM

## 2013-09-24 DIAGNOSIS — R740 Nonspecific elevation of levels of transaminase and lactic acid dehydrogenase [LDH]: Secondary | ICD-10-CM

## 2013-09-24 MED ORDER — METOCLOPRAMIDE HCL 5 MG/ML IJ SOLN
5.0000 mg | Freq: Two times a day (BID) | INTRAMUSCULAR | Status: DC
  Administered 2013-09-24: 5 mg via INTRAVENOUS
  Filled 2013-09-24 (×2): qty 1

## 2013-09-24 MED ORDER — LEVOTHYROXINE SODIUM 25 MCG PO TABS
25.0000 ug | ORAL_TABLET | Freq: Every day | ORAL | Status: DC
  Administered 2013-09-24 – 2013-09-25 (×2): 25 ug via ORAL
  Filled 2013-09-24 (×3): qty 1

## 2013-09-24 MED ORDER — BISACODYL 10 MG RE SUPP: 10.0000 mg | Freq: Every day | RECTAL | Status: DC | PRN

## 2013-09-24 MED ORDER — FLEET ENEMA 7-19 GM/118ML RE ENEM
1.0000 | ENEMA | Freq: Once | RECTAL | Status: AC
  Administered 2013-09-24: 1 via RECTAL
  Filled 2013-09-24: qty 1

## 2013-09-24 NOTE — Progress Notes (Signed)
[facility] TEAM 1 - Stepdown/ICU TEAM Progress Note  Leslie Benson [id number]:[id number] DOB: 1960/11/04 DOA: 09/21/2013 PCP: Mayra Neer, MD  Admit HPI / Brief Narrative: 53 year old white female presents with several days of abdominal pain and found to have mild ileus. With hydration and Reglan, symptoms improving his diet advancing. She mentioned that her bowels only move about once a week, so TSH ordered and found to be elevated at 12.   HPI/Subjective: 6/3 patient states has been able to tolerate clear liquids, and soft diet with only some mild nausea. Has had one large uncontrolled BM, positive flatulence.  Assessment/Plan: Adynamic ileus:  -Followup x-rays show improvement. -Decrease Reglan IV to 5 mg BID -Continue soft diet   Obesity (BMI 30-39.9):  -Patient criteria BMI of 32   Abdominal pain, epigastric: -Resolving, some mild tenderness in the epigastric right upper quadrant   Transaminitis:  -Mild. Likely secondary to dehydration from poor by mouth. Continue to monitor  Cervical arthritis:  -Stable.   Chronic constipation:   -Could be a component of hypothyroidism. Start Synthroid 25 mcg daily  Abnormal TSH: -Start Synthroid 25 mcg daily -Most likely contributing to patient's GI issues    Code Status: FULL Family Communication: no family present at time of exam Disposition Plan: Discharge in next 24-48 hour    Consultants: Dr. Alphonsa Overall (surgery)   Procedure/Significant Events: 5/31 CT abdomen pelvis with contrast -mild prominence proximal small bowel, stomach distention and air-fluid levels within the small bowel and colon, consistent with a mild diffuse adynamic ileus.  -Small liver lesions most likely cysts. Chronic intra and extrahepatic bile duct dilation. Status post cholecystectomy.   Culture NA  Antibiotics: NA  DVT prophylaxis: Heparin   Devices NA   LINES / TUBES:  6/3 22ga right forearm    Continuous Infusions:    Objective: VITAL SIGNS: Temp: 97.3 F (36.3 C) (06/03 1315) Temp src: Oral (06/03 1315) BP: 103/73 mmHg (06/03 1315) Pulse Rate: 96 (06/03 1315) SPO2; FIO2:   Intake/Output Summary (Last 24 hours) at 09/24/13 1723 Last data filed at 09/23/13 2007  Gross per 24 hour  Intake    540 ml  Output   1000 ml  Net   -460 ml     Exam: General: A./O. x4, NAD, No acute respiratory distress Lungs: Clear to auscultation bilaterally without wheezes or crackles Cardiovascular: Regular rate and rhythm without murmur gallop or rub normal S1 and S2 Abdomen: Soft, mildly tender to palpation epigastric/right upper cord are, nondistended, soft, bowel sounds positive, no rebound, no ascites, no appreciable mass Extremities: No significant cyanosis, clubbing, or edema bilateral lower extremities  Data Reviewed: Basic Metabolic Panel:  Recent Labs Lab 09/21/13 2215 09/21/13 2351  NA 143 141  K 3.9 3.5*  CL 101 102  CO2 26  --   GLUCOSE 77 72  BUN 12 12  CREATININE 0.96 1.00  CALCIUM 10.4  --    Liver Function Tests:  Recent Labs Lab 09/21/13 2215  AST 63*  ALT 84*  ALKPHOS 197*  BILITOT 0.2*  PROT 7.6  ALBUMIN 4.5    Recent Labs Lab 09/21/13 2215  LIPASE 32   No results found for this basename: AMMONIA,  in the last 168 hours CBC:  Recent Labs Lab 09/21/13 2215 09/21/13 2351  WBC 7.1  --   HGB 14.5 15.3*  HCT 43.2 45.0  MCV 92.1  --   PLT 271  --    Cardiac Enzymes: No results found for this basename: CKTOTAL, CKMB,  CKMBINDEX, TROPONINI,  in the last 168 hours BNP (last 3 results) No results found for this basename: PROBNP,  in the last 8760 hours CBG: No results found for this basename: GLUCAP,  in the last 168 hours  No results found for this or any previous visit (from the past 240 hour(s)).   Studies:  Recent x-ray studies have been reviewed in detail by the Attending Physician  Scheduled Meds:  Scheduled Meds: . docusate sodium  100 mg Oral  BID  . gabapentin  600 mg Oral TID  . heparin  5,000 Units Subcutaneous 3 times per day  . levothyroxine  25 mcg Oral QAC breakfast  . lip balm  1 application Topical BID  . metoCLOPramide (REGLAN) injection  5 mg Intravenous 3 times per day  . polyethylene glycol  17 g Oral Daily  . QUEtiapine  800 mg Oral QHS  . saccharomyces boulardii  250 mg Oral BID  . venlafaxine XR  75 mg Oral TID    Time spent on care of this patient: 40 mins   Allie Bossier , MD   [facility] Office  [phone] Pager [phone]  On-Call/Text Page:      [email or web address]      password [email or web address]  If 7PM-7AM, please contact night-coverage [email or web address] Password [email or web address] 09/24/2013, 5:23 PM   LOS: 3 days

## 2013-09-24 NOTE — Progress Notes (Signed)
  Subjective: Pt doing well, pain minimal.  No N/V.  Had 2 BM's yesterday -loose.  Ambulating through the halls.  Wants to go home.  Tolerated diet yesterday.  Objective: Vital signs in last 24 hours: Temp:  [97.8 F (36.6 C)-98.1 F (36.7 C)] 97.9 F (36.6 C) (06/03 0547) Pulse Rate:  [73-97] 75 (06/03 0547) Resp:  [16] 16 (06/03 0547) BP: (94-110)/(64-75) 98/70 mmHg (06/03 0547) SpO2:  [96 %-99 %] 99 % (06/03 0547) Weight:  [182 lb 8 oz (82.781 kg)-185 lb 4.8 oz (84.052 kg)] 182 lb 8 oz (82.781 kg) (06/03 0500) Last BM Date: 09/21/13  Intake/Output from previous day: 06/02 0701 - 06/03 0700 In: 1065 [P.O.:240; I.V.:825] Out: 3300 [Urine:3300] Intake/Output this shift:    PE: Gen:  Alert, NAD, pleasant Abd: Soft, minimal tenderness in epigastrium, ND, +BS, no HSM   Lab Results:   Recent Labs  09/21/13 2215 09/21/13 2351  WBC 7.1  --   HGB 14.5 15.3*  HCT 43.2 45.0  PLT 271  --    BMET  Recent Labs  09/21/13 2215 09/21/13 2351  NA 143 141  K 3.9 3.5*  CL 101 102  CO2 26  --   GLUCOSE 77 72  BUN 12 12  CREATININE 0.96 1.00  CALCIUM 10.4  --    PT/INR No results found for this basename: LABPROT, INR,  in the last 72 hours CMP     Component Value Date/Time   NA 141 09/21/2013 2351   K 3.5* 09/21/2013 2351   CL 102 09/21/2013 2351   CO2 26 09/21/2013 2215   GLUCOSE 72 09/21/2013 2351   BUN 12 09/21/2013 2351   CREATININE 1.00 09/21/2013 2351   CALCIUM 10.4 09/21/2013 2215   PROT 7.6 09/21/2013 2215   ALBUMIN 4.5 09/21/2013 2215   AST 63* 09/21/2013 2215   ALT 84* 09/21/2013 2215   ALKPHOS 197* 09/21/2013 2215   BILITOT 0.2* 09/21/2013 2215   GFRNONAA 66* 09/21/2013 2215   GFRAA 76* 09/21/2013 2215   Lipase     Component Value Date/Time   LIPASE 32 09/21/2013 2215       Studies/Results: No results found.  Anti-infectives: Anti-infectives   None       Assessment/Plan Abdominal pain of uncertain etiology Diarrhea Adynamic ileus Obesity (BMI  30-39.9) Abdominal pain, epigastric Transaminitis Cervical arthritis  Plan:  1. Cont. Conservative management (IVF, pain control, antiemetics), diet advanced to reg diet  2. Ambulate more and IS  3. SCD's and heparin  4. Multifactorial problem likely due to delayed gastric emptying, narcotic use - will need good bowel regimen, adequate hydration/nutrition, and increased activity 5. No acute surgical needs at this time, will sign off, call with questions/concerns   LOS: 3 days    Coralie Keens 09/24/2013, 7:51 AM Pager: [phone]  Agree with above. No obvious surgical issues.  Alphonsa Overall, MD, [facility] Pager: [phone] Office phone:  [phone]

## 2013-09-25 LAB — COMPREHENSIVE METABOLIC PANEL
ALT: 85 U/L — ABNORMAL HIGH (ref 0–35)
AST: 70 U/L — ABNORMAL HIGH (ref 0–37)
Albumin: 3.7 g/dL (ref 3.5–5.2)
Alkaline Phosphatase: 181 U/L — ABNORMAL HIGH (ref 39–117)
BILIRUBIN TOTAL: 0.3 mg/dL (ref 0.3–1.2)
BUN: 14 mg/dL (ref 6–23)
CHLORIDE: 103 meq/L (ref 96–112)
CO2: 25 meq/L (ref 19–32)
Calcium: 9.2 mg/dL (ref 8.4–10.5)
Creatinine, Ser: 0.86 mg/dL (ref 0.50–1.10)
GFR calc Af Amer: 87 mL/min — ABNORMAL LOW (ref >90)
GFR, EST NON AFRICAN AMERICAN: 75 mL/min — AB (ref >90)
GLUCOSE: 102 mg/dL — AB (ref 70–99)
Potassium: 4 mEq/L (ref 3.7–5.3)
Sodium: 139 mEq/L (ref 137–147)
Total Protein: 6.7 g/dL (ref 6.0–8.3)

## 2013-09-25 MED ORDER — LEVOTHYROXINE SODIUM 25 MCG PO TABS: 25.0000 ug | ORAL_TABLET | Freq: Every day | ORAL | Status: DC

## 2013-09-25 MED ORDER — SACCHAROMYCES BOULARDII 250 MG PO CAPS: 250.0000 mg | ORAL_CAPSULE | Freq: Two times a day (BID) | ORAL | Status: DC

## 2013-09-25 NOTE — Discharge Summary (Signed)
Physician Discharge Summary  Leslie Benson BZJ:[id number] DOB: August 03, 1960 DOA: 09/21/2013  PCP: Mayra Neer, MD  Admit date: 09/21/2013 Discharge date: 09/25/2013  Recommendations for Outpatient Follow-up:  1. Pt will need to follow up with PCP in 2-3 weeks post discharge 2. Please obtain BMP to evaluate electrolytes and kidney function 3. Please also check CBC to evaluate Hg and Hct levels  Discharge Diagnoses: Ileus  Principal Problem:   Adynamic ileus Active Problems:   Obesity (BMI 30-39.9)   Abdominal pain, epigastric   Transaminitis   Cervical arthritis   Chronic constipation   Abnormal TSH  Discharge Condition: Stable  Diet recommendation: Heart healthy diet discussed in details   History of present illness:  53 year old white female presented with several days of abdominal pain and found to have mild ileus. With hydration and Reglan, symptoms improved and pt tolerated regular diet well. She mentioned that her bowels only move about once a week, so TSH ordered and found to be elevated at 12.   Assessment/Plan:  Adynamic ileus:  - Followup x-rays show improvement.  - Advanced diet to regular and pt tolerated well - wants to go home  Obesity (BMI 30-39.9):  - dietary recommendations provided and pt appreciated time spent  Abdominal pain, epigastric:  - Resolved  Transaminitis:  - Mild. Likely secondary to dehydration from poor by mouth. Cervical arthritis:  - Stable.  Chronic constipation:  - Could be a component of hypothyroidism. Start Synthroid 25 mcg daily  Abnormal TSH:  - Started Synthroid 25 mcg daily  - Most likely contributing to patient's GI issues   Code Status: FULL  Family Communication: no family present at time of exam   Procedures/Studies: Ct Abdomen Pelvis W Contrast 09/22/2013  There is mild prominence of proximal small bowel, stomach distention and air-fluid levels within the small bowel and colon, findings consistent with a mild diffuse  adynamic ileus. No evidence of bowel obstruction no bowel wall thickening or inflammatory changes. No other evidence of an acute abnormality. Extent and minimal pleural effusions. Mild subsegmental lung base atelectasis. Small liver lesions most likely cysts. Chronic intra and extrahepatic bile duct dilation. Status post cholecystectomy.   Dg Chest Port 1 View  09/21/2013  No acute cardiopulmonary abnormality.    Dg Abd Acute W/chest  09/22/2013  Negative for bowel obstruction or perforation.    Consultations:  Surgery Antibiotics:  None   Discharge Exam: Filed Vitals:   09/25/13 0527  BP: 93/69  Pulse: 78  Temp: 97.8 F (36.6 C)  Resp: 16   Filed Vitals:   09/24/13 1315 09/24/13 1941 09/25/13 0500 09/25/13 0527  BP: 103/73 100/67  93/69  Pulse: 96 95  78  Temp: 97.3 F (36.3 C) 97.3 F (36.3 C)  97.8 F (36.6 C)  TempSrc: Oral Oral  Oral  Resp: 16 18  16   Height:      Weight:   83.553 kg (184 lb 3.2 oz)   SpO2: 95% 99%  96%    General: Pt is alert, follows commands appropriately, not in acute distress Cardiovascular: Regular rate and rhythm, S1/S2 +, no murmurs, no rubs, no gallops Respiratory: Clear to auscultation bilaterally, no wheezing, no crackles, no rhonchi Abdominal: Soft, non tender, non distended, bowel sounds +, no guarding Extremities: no edema, no cyanosis, pulses palpable bilaterally DP and PT Neuro: Grossly nonfocal  Discharge Instructions  Discharge Instructions   Diet - low sodium heart healthy    Complete by:  As directed  Increase activity slowly    Complete by:  As directed             Medication List         ALPRAZolam 1 MG tablet  Commonly known as:  XANAX  Take 2 mg by mouth 3 (three) times daily as needed for anxiety (anxiety).     gabapentin 600 MG tablet  Commonly known as:  NEURONTIN  Take 1 tablet (600 mg total) by mouth 3 (three) times daily.     levothyroxine 25 MCG tablet  Commonly known as:  SYNTHROID, LEVOTHROID   Take 1 tablet (25 mcg total) by mouth daily before breakfast.     naproxen sodium 550 MG tablet  Commonly known as:  ANAPROX  Take 550 mg by mouth 2 (two) times daily as needed for moderate pain.     QUEtiapine 400 MG tablet  Commonly known as:  SEROQUEL  Take 800 mg by mouth at bedtime.     saccharomyces boulardii 250 MG capsule  Commonly known as:  FLORASTOR  Take 1 capsule (250 mg total) by mouth 2 (two) times daily.     traMADol 50 MG tablet  Commonly known as:  ULTRAM  Take 2 tablets (100 mg total) by mouth every 6 (six) hours as needed for pain.     venlafaxine XR 75 MG 24 hr capsule  Commonly known as:  EFFEXOR-XR  Take 75 mg by mouth 3 (three) times daily.           Follow-up Information   Schedule an appointment as soon as possible for a visit with Mayra Neer, MD.   Specialty:  Family Medicine   Contact information:   [address] [place] [address] [phone]       Follow up with Faye Ramsay, MD. (As needed, If symptoms worsen, call my cell phone [phone])    Specialty:  Internal Medicine   Contact information:   [address] [place] [address] [phone]        The results of significant diagnostics from this hospitalization (including imaging, microbiology, ancillary and laboratory) are listed below for reference.     Microbiology: No results found for this or any previous visit (from the past 240 hour(s)).   Labs: Basic Metabolic Panel:  Recent Labs Lab 09/21/13 2215 09/21/13 2351 09/25/13 0445  NA 143 141 139  K 3.9 3.5* 4.0  CL 101 102 103  CO2 26  --  25  GLUCOSE 77 72 102*  BUN 12 12 14   CREATININE 0.96 1.00 0.86  CALCIUM 10.4  --  9.2   Liver Function Tests:  Recent Labs Lab 09/21/13 2215 09/25/13 0445  AST 63* 70*  ALT 84* 85*  ALKPHOS 197* 181*  BILITOT 0.2* 0.3  PROT 7.6 6.7  ALBUMIN 4.5 3.7    Recent Labs Lab 09/21/13 2215  LIPASE 32   CBC:  Recent  Labs Lab 09/21/13 2215 09/21/13 2351  WBC 7.1  --   HGB 14.5 15.3*  HCT 43.2 45.0  MCV 92.1  --   PLT 271  --    SIGNED: Time coordinating discharge: Over 30 minutes  Theodis Blaze, MD  [facility] 09/25/2013, 12:10 PM Pager [phone]  If 7PM-7AM, please contact night-coverage [email or web address] Password [email or web address]

## 2013-09-25 NOTE — Discharge Instructions (Signed)
Abdominal Pain, Adult °Many things can cause abdominal pain. Usually, abdominal pain is not caused by a disease and will improve without treatment. It can often be observed and treated at home. Your health care provider will do a physical exam and possibly order blood tests and X-rays to help determine the seriousness of your pain. However, in many cases, more time must pass before a clear cause of the pain can be found. Before that point, your health care provider may not know if you need more testing or further treatment. °HOME CARE INSTRUCTIONS  °Monitor your abdominal pain for any changes. The following actions may help to alleviate any discomfort you are experiencing: °· Only take over-the-counter or prescription medicines as directed by your health care provider. °· Do not take laxatives unless directed to do so by your health care provider. °· Try a clear liquid diet (broth, tea, or water) as directed by your health care provider. Slowly move to a bland diet as tolerated. °SEEK MEDICAL CARE IF: °· You have unexplained abdominal pain. °· You have abdominal pain associated with nausea or diarrhea. °· You have pain when you urinate or have a bowel movement. °· You experience abdominal pain that wakes you in the night. °· You have abdominal pain that is worsened or improved by eating food. °· You have abdominal pain that is worsened with eating fatty foods. °SEEK IMMEDIATE MEDICAL CARE IF:  °· Your pain does not go away within 2 hours. °· You have a fever. °· You keep throwing up (vomiting). °· Your pain is felt only in portions of the abdomen, such as the right side or the left lower portion of the abdomen. °· You pass bloody or black tarry stools. °MAKE SURE YOU: °· Understand these instructions.   °· Will watch your condition.   °· Will get help right away if you are not doing well or get worse.   °Document Released: 01/18/2005 Document Revised: 01/29/2013 Document Reviewed: 12/18/2012 °ExitCare® Patient  Information ©2014 [employer]. ° °

## 2013-10-27 ENCOUNTER — Other Ambulatory Visit: Payer: Self-pay | Admitting: Family Medicine

## 2013-10-27 DIAGNOSIS — R7989 Other specified abnormal findings of blood chemistry: Secondary | ICD-10-CM

## 2013-10-27 DIAGNOSIS — R1011 Right upper quadrant pain: Secondary | ICD-10-CM

## 2013-10-27 DIAGNOSIS — R945 Abnormal results of liver function studies: Principal | ICD-10-CM

## 2013-10-29 ENCOUNTER — Other Ambulatory Visit: Payer: Self-pay | Admitting: Family Medicine

## 2013-10-29 ENCOUNTER — Ambulatory Visit
Admission: RE | Admit: 2013-10-29 | Discharge: 2013-10-29 | Disposition: A | Discharge status: Home / Self Care | Payer: Commercial Managed Care - HMO | Source: Ambulatory Visit | Attending: Family Medicine | Admitting: Family Medicine

## 2013-10-29 DIAGNOSIS — R7989 Other specified abnormal findings of blood chemistry: Secondary | ICD-10-CM

## 2013-10-29 DIAGNOSIS — R1011 Right upper quadrant pain: Secondary | ICD-10-CM

## 2013-10-29 DIAGNOSIS — R945 Abnormal results of liver function studies: Principal | ICD-10-CM

## 2013-11-17 ENCOUNTER — Encounter (HOSPITAL_COMMUNITY): Payer: Self-pay | Admitting: Pharmacy Technician

## 2013-11-17 ENCOUNTER — Encounter (HOSPITAL_COMMUNITY): Payer: Self-pay | Admitting: *Deleted

## 2013-11-19 ENCOUNTER — Encounter (HOSPITAL_COMMUNITY): Payer: Medicare HMO | Admitting: Anesthesiology

## 2013-11-19 ENCOUNTER — Ambulatory Visit (HOSPITAL_COMMUNITY): Payer: Medicare HMO | Admitting: Anesthesiology

## 2013-11-19 ENCOUNTER — Other Ambulatory Visit: Payer: Self-pay | Admitting: Gastroenterology

## 2013-11-19 ENCOUNTER — Encounter (HOSPITAL_COMMUNITY)
Admission: RE | Disposition: A | Discharge status: Home / Self Care | Payer: Medicare Other | Source: Ambulatory Visit | Attending: Gastroenterology

## 2013-11-19 ENCOUNTER — Ambulatory Visit (HOSPITAL_COMMUNITY)
Admission: RE | Admit: 2013-11-19 | Discharge: 2013-11-19 | Disposition: A | Discharge status: Home / Self Care | Payer: Medicare HMO | Source: Ambulatory Visit | Attending: Gastroenterology | Admitting: Gastroenterology

## 2013-11-19 ENCOUNTER — Encounter (HOSPITAL_COMMUNITY): Payer: Self-pay | Admitting: Anesthesiology

## 2013-11-19 DIAGNOSIS — R109 Unspecified abdominal pain: Secondary | ICD-10-CM | POA: Diagnosis present

## 2013-11-19 DIAGNOSIS — K219 Gastro-esophageal reflux disease without esophagitis: Secondary | ICD-10-CM | POA: Insufficient documentation

## 2013-11-19 DIAGNOSIS — R748 Abnormal levels of other serum enzymes: Secondary | ICD-10-CM | POA: Insufficient documentation

## 2013-11-19 DIAGNOSIS — K449 Diaphragmatic hernia without obstruction or gangrene: Secondary | ICD-10-CM | POA: Insufficient documentation

## 2013-11-19 HISTORY — DX: Headache: R51

## 2013-11-19 HISTORY — DX: Personal history of other diseases of the digestive system: Z87.19

## 2013-11-19 HISTORY — PX: EUS: SHX5427

## 2013-11-19 SURGERY — ESOPHAGEAL ENDOSCOPIC ULTRASOUND (EUS) RADIAL
Anesthesia: Monitor Anesthesia Care

## 2013-11-19 MED ORDER — PROPOFOL 10 MG/ML IV BOLUS
INTRAVENOUS | Status: AC
  Filled 2013-11-19: qty 20

## 2013-11-19 MED ORDER — LACTATED RINGERS IV SOLN
INTRAVENOUS | Status: DC | PRN
  Administered 2013-11-19: 13:00:00 via INTRAVENOUS

## 2013-11-19 MED ORDER — SODIUM CHLORIDE 0.9 % IV SOLN: INTRAVENOUS | Status: DC

## 2013-11-19 MED ORDER — PROPOFOL 10 MG/ML IV BOLUS
INTRAVENOUS | Status: DC | PRN
  Administered 2013-11-19 (×5): 50 mg via INTRAVENOUS

## 2013-11-19 NOTE — Anesthesia Preprocedure Evaluation (Signed)
Anesthesia Evaluation  Patient identified by MRN, date of birth, ID band Patient awake    Reviewed: Allergy & Precautions, H&P , NPO status , Patient's Chart, lab work & pertinent test results  Airway Mallampati: II TM Distance: >3 FB Neck ROM: Full    Dental no notable dental hx.    Pulmonary neg pulmonary ROS,  breath sounds clear to auscultation  Pulmonary exam normal       Cardiovascular negative cardio ROS  Rhythm:Regular Rate:Normal     Neuro/Psych negative neurological ROS  negative psych ROS   GI/Hepatic negative GI ROS, Neg liver ROS, hiatal hernia, GERD-  ,  Endo/Other  negative endocrine ROSMorbid obesity  Renal/GU negative Renal ROS  negative genitourinary   Musculoskeletal negative musculoskeletal ROS (+)   Abdominal   Peds negative pediatric ROS (+)  Hematology negative hematology ROS (+)   Anesthesia Other Findings   Reproductive/Obstetrics negative OB ROS                           Anesthesia Physical Anesthesia Plan  ASA: II  Anesthesia Plan: MAC   Post-op Pain Management:    Induction: Intravenous  Airway Management Planned: Nasal Cannula  Additional Equipment:   Intra-op Plan:   Post-operative Plan:   Informed Consent: I have reviewed the patients History and Physical, chart, labs and discussed the procedure including the risks, benefits and alternatives for the proposed anesthesia with the patient or authorized representative who has indicated his/her understanding and acceptance.   Dental advisory given  Plan Discussed with: CRNA and Surgeon  Anesthesia Plan Comments:         Anesthesia Quick Evaluation

## 2013-11-19 NOTE — Addendum Note (Signed)
Addended by: Arta Silence on: 11/19/2013 10:38 AM   Modules accepted: Orders

## 2013-11-19 NOTE — Op Note (Signed)
[facility] [place], [address]   ENDOSCOPIC ULTRASOUND PROCEDURE REPORT  PATIENT: Leslie, Benson  MR#: [id number] BIRTHDATE: Sep 21, 1960  GENDER: Female ENDOSCOPIST: Arta Silence, MD REFERRED BY:  Ronald Lobo, M.D. PROCEDURE DATE:  11/19/2013 PROCEDURE:   Upper EUS ASA CLASS:      Class II INDICATIONS:   1.  abdominal pain, elevated liver enzymes. MEDICATIONS: MAC sedation, administered by CRNA  DESCRIPTION OF PROCEDURE:   After the risks benefits and alternatives of the procedure were  explained, informed consent was obtained. The patient was then placed in the left, lateral, decubitus postion and IV sedation was administered. Throughout the procedure, the patients blood pressure, pulse and oxygen saturations were monitored continuously.  Under direct visualization, the EUS scope  endoscope was introduced through the mouth  and advanced to the second portion of the duodenum .  Water was used as necessary to provide an acoustic interface.  Upon completion of the imaging, water was removed and the patient was sent to the recovery room in satisfactory condition.    FINDINGS:      Pancreatic head, uncinate, genu, body and tail evaluated; no chronic pancreatitis, pancreatic mass, peripancreatic adenopathy, or pancreatic cyst identified.  Ampulla was normal via EUS.  Faintly hyperechoic liver, which can be seen with steatosis. Post-cholecystectomy.  Bile duct non-dilated, and no evidence of choledocholithiasis, mass, or wall thickening was identified.   IMPRESSION:     As above.  No clear explanation for patient's elevated liver enzymes was identified, although steatosis could potentially be a causative factor.  RECOMMENDATIONS:     1.  Watch for potential complications of procedure. 2.  Follow-up with Dr. Cristina Gong for ongoing management of abdominal pain.   _______________________________ Lorrin Mais:  Arta Silence, MD 11/19/2013  2:31 PM   CC:

## 2013-11-19 NOTE — Transfer of Care (Signed)
Immediate Anesthesia Transfer of Care Note  Patient: Leslie Benson  Procedure(s) Performed: Procedure(s): ESOPHAGEAL ENDOSCOPIC ULTRASOUND (EUS) RADIAL (N/A)  Patient Location: PACU  Anesthesia Type:MAC  Level of Consciousness: awake, sedated and patient cooperative  Airway & Oxygen Therapy: Patient Spontanous Breathing and Patient connected to face mask oxygen  Post-op Assessment: Report given to PACU RN and Post -op Vital signs reviewed and stable  Post vital signs: Reviewed and stable  Complications: No apparent anesthesia complications

## 2013-11-19 NOTE — H&P (Signed)
Patient interval history reviewed.  Patient examined again.  There has been no change from documented H/P dated 11/14/13 (scanned into chart from our office) except as documented above.  Assessment:  1.  Abdominal pain. 2.  Elevated liver enzymes.  Plan:  1.  Endoscopic ultrasound. 2.  Risks (bleeding, infection, bowel perforation that could require surgery, sedation-related changes in cardiopulmonary systems), benefits (identification and possible treatment of source of symptoms, exclusion of certain causes of symptoms), and alternatives (watchful waiting, radiographic imaging studies, empiric medical treatment) of upper endoscopy with ultrasound (EUS) were explained to patient/family in detail and patient wishes to proceed.

## 2013-11-19 NOTE — Anesthesia Postprocedure Evaluation (Signed)
  Anesthesia Post-op Note  Patient: Leslie Benson  Procedure(s) Performed: Procedure(s) (LRB): ESOPHAGEAL ENDOSCOPIC ULTRASOUND (EUS) RADIAL (N/A)  Patient Location: PACU  Anesthesia Type: MAC  Level of Consciousness: awake and alert   Airway and Oxygen Therapy: Patient Spontanous Breathing  Post-op Pain: mild  Post-op Assessment: Post-op Vital signs reviewed, Patient's Cardiovascular Status Stable, Respiratory Function Stable, Patent Airway and No signs of Nausea or vomiting  Last Vitals:  Filed Vitals:   11/19/13 1226  BP: 104/78  Pulse: 85  Temp: 36.9 C  Resp: 16    Post-op Vital Signs: stable   Complications: No apparent anesthesia complications

## 2013-11-19 NOTE — Discharge Instructions (Signed)
Endoscopic Ultrasound ° °Care After °Please read the instructions outlined below and refer to this sheet in the next few weeks. These discharge instructions provide you with general information on caring for yourself after you leave the hospital. Your doctor may also give you specific instructions. While your treatment has been planned according to the most current medical practices available, unavoidable complications occasionally occur. If you have any problems or questions after discharge, please call Dr. Namir Neto ([facility]) at [phone]. ° °HOME CARE INSTRUCTIONS °Activity °· You may resume your regular activity but move at a slower pace for the next 24 hours.  °· Take frequent rest periods for the next 24 hours.  °· Walking will help expel (get rid of) the air and reduce the bloated feeling in your abdomen.  °· No driving for 24 hours (because of the anesthesia (medicine) used during the test).  °· You may shower.  °· Do not sign any important legal documents or operate any machinery for 24 hours (because of the anesthesia used during the test).  °Nutrition °· Drink plenty of fluids.  °· You may resume your normal diet.  °· Begin with a light meal and progress to your normal diet.  °· Avoid alcoholic beverages for 24 hours or as instructed by your caregiver.  °Medications °You may resume your normal medications unless your caregiver tells you otherwise. °What you can expect today °· You may experience abdominal discomfort such as a feeling of fullness or "gas" pains.  °· You may experience a sore throat for 2 to 3 days. This is normal. Gargling with salt water may help this.  °·  °SEEK IMMEDIATE MEDICAL CARE IF: °· You have excessive nausea (feeling sick to your stomach) and/or vomiting.  °· You have severe abdominal pain and distention (swelling).  °· You have trouble swallowing.  °· You have a temperature over 100° F (37.8° C).  °· You have rectal bleeding or vomiting of blood.  °Document  Released: 11/23/2003 Document Revised: 12/21/2010 Document Reviewed: 06/05/2007 °ExitCare® Patient Information ©2012 [employer]. °

## 2013-11-20 ENCOUNTER — Encounter (HOSPITAL_COMMUNITY): Payer: Self-pay | Admitting: Gastroenterology

## 2014-04-23 ENCOUNTER — Other Ambulatory Visit: Payer: Self-pay | Admitting: Family Medicine

## 2014-04-23 DIAGNOSIS — Z1231 Encounter for screening mammogram for malignant neoplasm of breast: Secondary | ICD-10-CM

## 2014-05-04 ENCOUNTER — Ambulatory Visit: Payer: Medicare Other

## 2014-05-13 DIAGNOSIS — R740 Nonspecific elevation of levels of transaminase and lactic acid dehydrogenase [LDH]: Secondary | ICD-10-CM | POA: Diagnosis not present

## 2014-06-16 DIAGNOSIS — Z1211 Encounter for screening for malignant neoplasm of colon: Secondary | ICD-10-CM | POA: Diagnosis not present

## 2014-06-29 DIAGNOSIS — M542 Cervicalgia: Secondary | ICD-10-CM | POA: Diagnosis not present

## 2014-06-29 DIAGNOSIS — M25561 Pain in right knee: Secondary | ICD-10-CM | POA: Diagnosis not present

## 2014-06-29 DIAGNOSIS — J340 Abscess, furuncle and carbuncle of nose: Secondary | ICD-10-CM | POA: Diagnosis not present

## 2014-07-04 ENCOUNTER — Emergency Department (HOSPITAL_COMMUNITY)
Admission: EM | Admit: 2014-07-04 | Discharge: 2014-07-04 | Disposition: A | Discharge status: Home / Self Care | Payer: Commercial Managed Care - HMO | Attending: Emergency Medicine | Admitting: Emergency Medicine

## 2014-07-04 ENCOUNTER — Encounter (HOSPITAL_COMMUNITY): Payer: Self-pay | Admitting: Emergency Medicine

## 2014-07-04 ENCOUNTER — Emergency Department (HOSPITAL_COMMUNITY): Payer: Commercial Managed Care - HMO

## 2014-07-04 DIAGNOSIS — R079 Chest pain, unspecified: Secondary | ICD-10-CM

## 2014-07-04 DIAGNOSIS — H9209 Otalgia, unspecified ear: Secondary | ICD-10-CM | POA: Insufficient documentation

## 2014-07-04 DIAGNOSIS — Z8719 Personal history of other diseases of the digestive system: Secondary | ICD-10-CM | POA: Diagnosis not present

## 2014-07-04 DIAGNOSIS — R918 Other nonspecific abnormal finding of lung field: Secondary | ICD-10-CM | POA: Diagnosis not present

## 2014-07-04 DIAGNOSIS — R200 Anesthesia of skin: Secondary | ICD-10-CM | POA: Diagnosis not present

## 2014-07-04 DIAGNOSIS — M79602 Pain in left arm: Secondary | ICD-10-CM | POA: Diagnosis present

## 2014-07-04 DIAGNOSIS — R000 Tachycardia, unspecified: Secondary | ICD-10-CM | POA: Insufficient documentation

## 2014-07-04 DIAGNOSIS — R6884 Jaw pain: Secondary | ICD-10-CM

## 2014-07-04 DIAGNOSIS — R0602 Shortness of breath: Secondary | ICD-10-CM | POA: Insufficient documentation

## 2014-07-04 DIAGNOSIS — Z79899 Other long term (current) drug therapy: Secondary | ICD-10-CM | POA: Diagnosis not present

## 2014-07-04 DIAGNOSIS — F410 Panic disorder [episodic paroxysmal anxiety] without agoraphobia: Secondary | ICD-10-CM | POA: Diagnosis not present

## 2014-07-04 DIAGNOSIS — M797 Fibromyalgia: Secondary | ICD-10-CM | POA: Insufficient documentation

## 2014-07-04 DIAGNOSIS — J9811 Atelectasis: Secondary | ICD-10-CM | POA: Diagnosis not present

## 2014-07-04 DIAGNOSIS — R938 Abnormal findings on diagnostic imaging of other specified body structures: Secondary | ICD-10-CM | POA: Diagnosis not present

## 2014-07-04 DIAGNOSIS — R9389 Abnormal findings on diagnostic imaging of other specified body structures: Secondary | ICD-10-CM

## 2014-07-04 DIAGNOSIS — J40 Bronchitis, not specified as acute or chronic: Secondary | ICD-10-CM | POA: Diagnosis not present

## 2014-07-04 DIAGNOSIS — R911 Solitary pulmonary nodule: Secondary | ICD-10-CM | POA: Diagnosis not present

## 2014-07-04 LAB — CBC
HEMATOCRIT: 41.9 % (ref 36.0–46.0)
HEMOGLOBIN: 13.6 g/dL (ref 12.0–15.0)
MCH: 30.8 pg (ref 26.0–34.0)
MCHC: 32.5 g/dL (ref 30.0–36.0)
MCV: 95 fL (ref 78.0–100.0)
Platelets: 213 10*3/uL (ref 150–400)
RBC: 4.41 MIL/uL (ref 3.87–5.11)
RDW: 13.4 % (ref 11.5–15.5)
WBC: 11.1 10*3/uL — ABNORMAL HIGH (ref 4.0–10.5)

## 2014-07-04 LAB — BASIC METABOLIC PANEL
Anion gap: 8 (ref 5–15)
BUN: 11 mg/dL (ref 6–23)
CO2: 28 mmol/L (ref 19–32)
Calcium: 9.2 mg/dL (ref 8.4–10.5)
Chloride: 102 mmol/L (ref 96–112)
Creatinine, Ser: 0.87 mg/dL (ref 0.50–1.10)
GFR calc Af Amer: 85 mL/min — ABNORMAL LOW (ref >90)
GFR calc non Af Amer: 74 mL/min — ABNORMAL LOW (ref >90)
GLUCOSE: 91 mg/dL (ref 70–99)
POTASSIUM: 3.8 mmol/L (ref 3.5–5.1)
SODIUM: 138 mmol/L (ref 135–145)

## 2014-07-04 LAB — I-STAT TROPONIN, ED
Troponin i, poc: 0.01 ng/mL (ref 0.00–0.08)
Troponin i, poc: 0 ng/mL (ref 0.00–0.08)

## 2014-07-04 LAB — D-DIMER, QUANTITATIVE (NOT AT ARMC): D DIMER QUANT: 1.53 ug{FEU}/mL — AB (ref 0.00–0.48)

## 2014-07-04 MED ORDER — MORPHINE SULFATE 4 MG/ML IJ SOLN
4.0000 mg | Freq: Once | INTRAMUSCULAR | Status: AC
  Administered 2014-07-04: 4 mg via INTRAVENOUS
  Filled 2014-07-04: qty 1

## 2014-07-04 MED ORDER — OXYCODONE-ACETAMINOPHEN 5-325 MG PO TABS: 2.0000 | ORAL_TABLET | ORAL | Status: DC | PRN

## 2014-07-04 MED ORDER — HYDROMORPHONE HCL 1 MG/ML IJ SOLN
0.5000 mg | Freq: Once | INTRAMUSCULAR | Status: AC
  Administered 2014-07-04: 0.5 mg via INTRAVENOUS
  Filled 2014-07-04: qty 1

## 2014-07-04 MED ORDER — ASPIRIN 325 MG PO TABS
325.0000 mg | ORAL_TABLET | Freq: Once | ORAL | Status: AC
  Administered 2014-07-04: 325 mg via ORAL
  Filled 2014-07-04: qty 1

## 2014-07-04 MED ORDER — ONDANSETRON HCL 4 MG/2ML IJ SOLN
4.0000 mg | Freq: Once | INTRAMUSCULAR | Status: AC
  Administered 2014-07-04: 4 mg via INTRAVENOUS
  Filled 2014-07-04: qty 2

## 2014-07-04 MED ORDER — SODIUM CHLORIDE 0.9 % IV BOLUS (SEPSIS)
1000.0000 mL | Freq: Once | INTRAVENOUS | Status: AC
  Administered 2014-07-04: 1000 mL via INTRAVENOUS

## 2014-07-04 MED ORDER — IOHEXOL 350 MG/ML SOLN
100.0000 mL | Freq: Once | INTRAVENOUS | Status: AC | PRN
  Administered 2014-07-04: 100 mL via INTRAVENOUS

## 2014-07-04 NOTE — ED Notes (Signed)
Pt flags down tech when she walks in the lobby and states, "I think I'm having a heart attack".  Pt c/o lt shoulder/arm pain x 1 wk.  States that this has happened before.  States that she went to [facility] and they were about to close so they told her to come to the ER.  Pt in NAD.

## 2014-07-04 NOTE — ED Provider Notes (Signed)
CSN: [id number]     Arrival date & time 07/04/14  1350 History   First MD Initiated Contact with Patient 07/04/14 1409     Chief Complaint  Patient presents with  . Arm Pain     (Consider location/radiation/quality/duration/timing/severity/associated sxs/prior Treatment) Patient is a 54 y.o. female presenting with arm pain. The history is provided by the patient. No language interpreter was used.  Arm Pain Associated symptoms include chest pain and numbness. Pertinent negatives include no abdominal pain, coughing, fever, headaches, nausea, sore throat, vomiting or weakness.  Leslie Benson presents with new onset left arm numbness and tingling that began one week ago and today presents with new onset left arm pain that radiates up to the left side of her neck and chest.  She describes the pain as constant and sharp since this morning.  She has no cardiac history. It is worse with deep breaths. She just started working out last week. She denies any nausea, vomiting, abdominal pain, or lower extremity swelling. She did not take anything to treat her symptoms. She has a family history of CAD. Her brother died at age 60 and sister also had a heart attack in her 78's.   Past Medical History  Diagnosis Date  . Fibromyalgia   . Spinal stenosis   . Panic attack   . Barrett esophagus   . Paraesophageal hiatal hernia     repaired 2009  . Cervical arthritis 09/22/2013  . H/O hiatal hernia   . Headache(784.0)     migraines   Past Surgical History  Procedure Laterality Date  . Tubal ligation  1990s?  Marland Kitchen Cholecystectomy  1980s?  . Vaginal hysterectomy  03/04/2001  . Ankle surgery    . Laparoscopic paraesophageal hernia repair  03/05/2008    Dr Johney Maine  . Laparoscopic nissen fundoplication  19/62/2297  . Eus N/A 11/19/2013    Procedure: ESOPHAGEAL ENDOSCOPIC ULTRASOUND (EUS) RADIAL;  Surgeon: Arta Silence, MD;  Location: [facility];  Service: Endoscopy;  Laterality: N/A;   History reviewed. No  pertinent family history. History  Substance Use Topics  . Smoking status: Never Smoker   . Smokeless tobacco: Never Used  . Alcohol Use: Yes     Comment: wine occassionally   OB History    No data available     Review of Systems  Constitutional: Negative for fever.  HENT: Positive for ear pain. Negative for sore throat.   Eyes: Negative for visual disturbance.  Respiratory: Positive for shortness of breath. Negative for cough.   Cardiovascular: Positive for chest pain. Negative for leg swelling.  Gastrointestinal: Negative for nausea, vomiting and abdominal pain.  Neurological: Positive for numbness. Negative for dizziness, syncope, weakness and headaches.  All other systems reviewed and are negative.     Allergies  Codeine and Sulfa antibiotics  Home Medications   Prior to Admission medications   Medication Sig Start Date End Date Taking? Authorizing Provider  ALPRAZolam Duanne Moron) 1 MG tablet Take 2 mg by mouth 3 (three) times daily as needed for anxiety (anxiety). 06/20/12  Yes Virgel Manifold, MD  gabapentin (NEURONTIN) 600 MG tablet Take 1 tablet (600 mg total) by mouth 3 (three) times daily. 06/20/12  Yes Virgel Manifold, MD  levothyroxine (SYNTHROID, LEVOTHROID) 25 MCG tablet Take 1 tablet (25 mcg total) by mouth daily before breakfast. 09/25/13  Yes Theodis Blaze, MD  omega-3 acid ethyl esters (LOVAZA) 1 G capsule Take 1 g by mouth 2 (two) times daily.   Yes Historical Provider, MD  QUEtiapine (SEROQUEL) 400 MG tablet Take 800 mg by mouth at bedtime.   Yes Historical Provider, MD  SUMAtriptan (IMITREX) 100 MG tablet Take 100 mg by mouth every 2 (two) hours as needed for migraine. May repeat in 2 hours if headache persists or recurs.   Yes Historical Provider, MD  traMADol (ULTRAM) 50 MG tablet Take 2 tablets (100 mg total) by mouth every 6 (six) hours as needed for pain. 06/20/12  Yes Virgel Manifold, MD  venlafaxine XR (EFFEXOR-XR) 75 MG 24 hr capsule Take 75 mg by mouth 3 (three)  times daily.   Yes Historical Provider, MD  oxyCODONE-acetaminophen (PERCOCET/ROXICET) 5-325 MG per tablet Take 2 tablets by mouth every 4 (four) hours as needed for severe pain. 07/04/14   Marty Sadlowski Patel-Mills, PA-C  saccharomyces boulardii (FLORASTOR) 250 MG capsule Take 1 capsule (250 mg total) by mouth 2 (two) times daily. Patient not taking: Reported on 07/04/2014 09/25/13   Theodis Blaze, MD   BP 104/62 mmHg  Pulse 68  Temp(Src) 98.3 F (36.8 C) (Oral)  Resp 18  SpO2 98% Physical Exam  Constitutional: She is oriented to person, place, and time. She appears well-developed and well-nourished.  HENT:  Head: Normocephalic and atraumatic.  Mouth/Throat: Uvula is midline and oropharynx is clear and moist.  No tonsillar or pharyngeal edema or exudates.   Eyes: Conjunctivae are normal.  Neck: Normal range of motion. Neck supple.  Cardiovascular: Regular rhythm and normal heart sounds.  Tachycardia present.   Pulmonary/Chest: Effort normal and breath sounds normal.  She has reproducible chest wall tenderness and pain with left arm movement.    Abdominal: Soft. There is no tenderness.  Musculoskeletal: Normal range of motion.  Neurological: She is alert and oriented to person, place, and time.  Skin: Skin is warm and dry.  Nursing note and vitals reviewed.   ED Course  Procedures (including critical care time) Labs Review Labs Reviewed  CBC - Abnormal; Notable for the following:    WBC 11.1 (*)    All other components within normal limits  BASIC METABOLIC PANEL - Abnormal; Notable for the following:    GFR calc non Af Amer 74 (*)    GFR calc Af Amer 85 (*)    All other components within normal limits  D-DIMER, QUANTITATIVE - Abnormal; Notable for the following:    D-Dimer, Quant 1.53 (*)    All other components within normal limits  I-STAT TROPOININ, ED  I-STAT TROPOININ, ED  I-STAT TROPOININ, ED    Imaging Review Dg Chest 2 View  07/04/2014   CLINICAL DATA:  Pain tingling to  LEFT shoulder and down LEFT arm, numbness LEFT hand, question heart attack, history hiatal hernia  EXAM: CHEST  2 VIEW  COMPARISON:  None  FINDINGS: Upper normal heart size.  Slight pulmonary vascular congestion.  Mediastinal contours normal.  Peribronchial thickening with minimal bibasilar atelectasis.  No acute infiltrate, pleural effusion or pneumothorax.  Bones demineralized.  IMPRESSION: Bronchitic changes with minimal bibasilar atelectasis.   Electronically Signed   By: Lavonia Dana M.D.   On: 07/04/2014 14:29   Ct Angio Chest Pe W/cm &/or Wo Cm  07/04/2014   CLINICAL DATA:  Acute onset of left-sided chest pain, progressively worsening, with left arm pain. Initial encounter.  EXAM: CT ANGIOGRAPHY CHEST WITH CONTRAST  TECHNIQUE: Multidetector CT imaging of the chest was performed using the standard protocol during bolus administration of intravenous contrast. Multiplanar CT image reconstructions and MIPs were obtained to evaluate the vascular  anatomy.  CONTRAST:  160mL OMNIPAQUE IOHEXOL 350 MG/ML SOLN  COMPARISON:  Chest radiograph performed earlier today at 2:22 p.m.  FINDINGS: There is no evidence of pulmonary embolus.  Mild patchy bilateral central airspace opacities are seen, with associated interstitial prominence, raising concern for mild interstitial edema. Trace bilateral pleural effusions are seen. There is no evidence of pneumothorax. No masses are identified; no abnormal focal contrast enhancement is seen.  The mediastinum is unremarkable in appearance. No mediastinal lymphadenopathy is seen. No pericardial effusion is identified. The great vessels are grossly unremarkable in appearance. The esophagus is partially filled with fluid, raising question for esophageal dysmotility.  No axillary lymphadenopathy is seen. The visualized portions of the thyroid gland are unremarkable in appearance. A 1.4 cm nodule is noted in the upper right breast.  The visualized portions of the liver and spleen are  unremarkable. Somewhat unusual calcified nodes are noted along the proximal abdominal aorta, just inferior to the gastroesophageal junction. Postoperative change is seen adjacent to the splenic hilum.  No acute osseous abnormalities are seen.  Review of the MIP images confirms the above findings.  IMPRESSION: 1. No evidence of pulmonary embolus. 2. Mild patchy bilateral central airspace opacities, with associated interstitial prominence, concerning for mild interstitial edema. Trace bilateral pleural effusions seen. 3. Esophagus partially filled with fluid, raising question for esophageal dysmotility. 4. Somewhat unusual calcified nodes along the proximal abdominal aorta, just inferior to the gastroesophageal junction. This may reflect remote granulomatous disease. 5. 1.4 cm nodule at the upper right breast. Would correlate with prior mammogram.   Electronically Signed   By: Garald Balding M.D.   On: 07/04/2014 18:40     EKG Interpretation   Date/Time:  Saturday July 04 2014 13:54:47 EST Ventricular Rate:  117 PR Interval:  154 QRS Duration: 115 QT Interval:  344 QTC Calculation: 480 R Axis:   91 Text Interpretation:  Sinus tachycardia RBBB and LPFB Low voltage,  precordial leads No old tracing to compare Confirmed by WARD,  DO, KRISTEN  ([id number]) on 07/04/2014 2:01:30 PM      MDM   Final diagnoses:  Chest pain, unspecified chest pain type  Abnormal CT scan, chest   I reviewed both the PERC score and heart score and she is low risk for both. Her troponin is negative. Electrolytes are unremarkable. She has a slight white count at 11.1 but otherwise unremarkable CBC. She has a negative chest xray for infiltrate, pleural effusion, or pnx.   I will get a D-dimer since she has pain that is worse with breathing. I will also get a repeat troponin in 4 hours.  She has been given aspirin and morphine. I spoke to Dr. Evelina Bucy who agrees with the plan. 17:39 I discussed d-dimer results with  patient and need for CTA.  She agrees with plan and is asking for more pain medication.  19:30 I discussed the CTA findings with Dr. Mingo Amber. Heart rate has come down.  Vitals are stable.  Filed Vitals:   07/04/14 2012  BP: 104/62  Pulse: 68  Temp: 98.3 F (36.8 C)  Resp: 18    Patient has a second negative troponin and CTA shows no pulmonary embolism but there are other changes that need for f/u with her PCP and GYN.  I gave the patient pain medication because she had been working out recently.     Ottie Glazier, PA-C 07/04/14 2028  Evelina Bucy, MD 07/05/14 [phone]

## 2014-07-04 NOTE — Discharge Instructions (Signed)

## 2014-07-08 DIAGNOSIS — R938 Abnormal findings on diagnostic imaging of other specified body structures: Secondary | ICD-10-CM | POA: Diagnosis not present

## 2014-07-08 DIAGNOSIS — M5412 Radiculopathy, cervical region: Secondary | ICD-10-CM | POA: Diagnosis not present

## 2014-07-08 DIAGNOSIS — R05 Cough: Secondary | ICD-10-CM | POA: Diagnosis not present

## 2014-07-08 DIAGNOSIS — N63 Unspecified lump in breast: Secondary | ICD-10-CM | POA: Diagnosis not present

## 2014-07-10 ENCOUNTER — Other Ambulatory Visit: Payer: Self-pay

## 2014-07-10 DIAGNOSIS — N63 Unspecified lump in unspecified breast: Secondary | ICD-10-CM

## 2014-07-15 ENCOUNTER — Other Ambulatory Visit: Payer: Self-pay | Admitting: Family Medicine

## 2014-07-15 DIAGNOSIS — N63 Unspecified lump in unspecified breast: Secondary | ICD-10-CM

## 2014-07-16 ENCOUNTER — Other Ambulatory Visit: Payer: Commercial Managed Care - HMO

## 2014-07-17 ENCOUNTER — Other Ambulatory Visit: Payer: Self-pay | Admitting: Family Medicine

## 2014-07-17 DIAGNOSIS — M5412 Radiculopathy, cervical region: Secondary | ICD-10-CM

## 2014-07-21 ENCOUNTER — Ambulatory Visit
Admission: RE | Admit: 2014-07-21 | Discharge: 2014-07-21 | Disposition: A | Discharge status: Home / Self Care | Payer: Commercial Managed Care - HMO | Source: Ambulatory Visit | Attending: Family Medicine | Admitting: Family Medicine

## 2014-07-21 DIAGNOSIS — M5412 Radiculopathy, cervical region: Secondary | ICD-10-CM

## 2014-07-21 DIAGNOSIS — M47812 Spondylosis without myelopathy or radiculopathy, cervical region: Secondary | ICD-10-CM | POA: Diagnosis not present

## 2014-07-22 ENCOUNTER — Ambulatory Visit
Admission: RE | Admit: 2014-07-22 | Discharge: 2014-07-22 | Disposition: A | Discharge status: Home / Self Care | Payer: Commercial Managed Care - HMO | Source: Ambulatory Visit | Attending: Family Medicine | Admitting: Family Medicine

## 2014-07-22 ENCOUNTER — Ambulatory Visit
Admission: RE | Admit: 2014-07-22 | Discharge: 2014-07-22 | Disposition: A | Discharge status: Home / Self Care | Payer: Commercial Managed Care - HMO | Source: Ambulatory Visit

## 2014-07-22 DIAGNOSIS — N63 Unspecified lump in unspecified breast: Secondary | ICD-10-CM

## 2014-07-29 ENCOUNTER — Ambulatory Visit: Payer: Commercial Managed Care - HMO | Attending: Family Medicine

## 2014-07-29 DIAGNOSIS — M4800 Spinal stenosis, site unspecified: Secondary | ICD-10-CM | POA: Diagnosis not present

## 2014-07-29 DIAGNOSIS — E669 Obesity, unspecified: Secondary | ICD-10-CM | POA: Diagnosis not present

## 2014-07-29 DIAGNOSIS — M797 Fibromyalgia: Secondary | ICD-10-CM | POA: Diagnosis not present

## 2014-07-29 DIAGNOSIS — M5010 Cervical disc disorder with radiculopathy, unspecified cervical region: Secondary | ICD-10-CM

## 2014-07-29 DIAGNOSIS — M542 Cervicalgia: Secondary | ICD-10-CM

## 2014-07-29 NOTE — Patient Instructions (Signed)
PERFORM ALL EXERCISES GENTLY AND WITH GOOD POSTURE.    10 SECOND HOLD, 3 REPS TO EACH SIDE. 4-5 TIMES EACH DAY.   AROM: Neck Rotation   Turn head slowly to look over one shoulder, then the other.   AROM: Neck Flexion   Bend head forward.   AROM: Lateral Neck Flexion: to the Left only   Slowly tilt head toward one shoulder, then the other.   Posture Tips DO: - stand tall and erect - keep chin tucked in - keep head and shoulders in alignment - check posture regularly in mirror or large window - pull head back against headrest in car seat;  Change your position often.  Sit with lumbar support. DON'T: - slouch or slump while watching TV or reading - sit, stand or lie in one position  for too long;  Sitting is especially hard on the spine so if you sit at a desk/use the computer, then stand up often!   Copyright  VHI. All rights reserved.  Posture - Standing   Good posture is important. Avoid slouching and forward head thrust. Maintain curve in low back and align ears over shoul- ders, hips over ankles.  Pull your belly button in toward your back bone.   Copyright  VHI. All rights reserved.  Posture - Sitting   Sit upright, head facing forward. Try using a roll to support lower back. Keep shoulders relaxed, and avoid rounded back. Keep hips level with knees. Avoid crossing legs for long periods.   Copyright  VHI. All rights reserved.

## 2014-07-29 NOTE — Therapy (Addendum)
[facility] [address] [place], [place], [address] Phone: [phone]   Fax:  [phone]  Physical Therapy Evaluation  Patient Details  Name: Leslie Benson MRN: [id number] Date of Birth: May 21, 1960 Referring Provider:  Mayra Neer, MD  Encounter Date: 07/29/2014      PT End of Session - 07/29/14 1610    Visit Number 1   Number of Visits 10  Medicare   Date for PT Re-Evaluation 09/23/14   PT Start Time 4403   PT Stop Time 1618   PT Time Calculation (min) 47 min   Activity Tolerance Patient tolerated treatment well   Behavior During Therapy [facility] for tasks assessed/performed      Past Medical History  Diagnosis Date  . Fibromyalgia   . Spinal stenosis   . Panic attack   . Barrett esophagus   . Paraesophageal hiatal hernia     repaired 2009  . Cervical arthritis 09/22/2013  . H/O hiatal hernia   . Headache(784.0)     migraines  . Fibromyalgia     Past Surgical History  Procedure Laterality Date  . Tubal ligation  1990s?  Marland Kitchen Cholecystectomy  1980s?  . Vaginal hysterectomy  03/04/2001  . Ankle surgery    . Laparoscopic paraesophageal hernia repair  03/05/2008    Dr Johney Maine  . Laparoscopic nissen fundoplication  47/42/5956  . Eus N/A 11/19/2013    Procedure: ESOPHAGEAL ENDOSCOPIC ULTRASOUND (EUS) RADIAL;  Surgeon: Arta Silence, MD;  Location: [facility];  Service: Endoscopy;  Laterality: N/A;    There were no vitals filed for this visit.  Visit Diagnosis:  Neck pain - Plan: PT plan of care cert/re-cert  Cervical disc disorder with radiculopathy of cervical region - Plan: PT plan of care cert/re-cert      Subjective Assessment - 07/29/14 1536    Subjective Pt is a Rt hand dominant female who presents with bilateral neck pain and Lt UE radiculopathy.  Numbness has been present since 07/04/14 when she had to go to ED as she thought that she was having heart attack.     Diagnostic tests recent  MRI: see results under imaging   Patient Stated Goals reduce Lt UE pain and neck pain   Currently in Pain? Yes   Pain Score 6    Pain Location Neck   Pain Orientation Right;Left   Pain Descriptors / Indicators Aching;Sore   Pain Onset 1 to 4 weeks ago   Pain Frequency Constant   Aggravating Factors  cervical AROM, computer use   Pain Relieving Factors not moving the head, medicine   Multiple Pain Sites Yes   Pain Score 8   Pain Location Arm   Pain Orientation Left   Pain Descriptors / Indicators Tingling;Numbness   Pain Onset 1 to 4 weeks ago   Pain Frequency Intermittent   Aggravating Factors  standing up,    Pain Relieving Factors medication, resting the arm            [facility] PT Assessment - 07/29/14 0001    Assessment   Medical Diagnosis cervical radiculopathy (M54.12)   Onset Date 07/04/14   Prior Therapy none   Precautions   Precautions None   Restrictions   Weight Bearing Restrictions Yes   Balance Screen   Has the patient fallen in the past 6 months No   Has the patient had a decrease in activity level because of a fear of falling?  No   Is the patient  reluctant to leave their home because of a fear of falling?  No   Home Environment   Living Enviornment Private residence   Home Access Stairs to enter   Home Layout Two level   Alternate Level Stairs-Number of Steps 6   Prior Function   Level of Independence Independent with basic ADLs   Vocation Unemployed;Other (comment)  disability   Leisure exercise for weight loss   Cognition   Overall Cognitive Status Within Functional Limits for tasks assessed   Observation/Other Assessments   Focus on Therapeutic Outcomes (FOTO)  51% limitation   Posture/Postural Control   Posture/Postural Control Postural limitations   Postural Limitations Rounded Shoulders;Forward head   ROM / Strength   AROM / PROM / Strength AROM;Strength   AROM   Overall AROM  Deficits   Overall AROM Comments UE AROM is limited by 25%  bilaterally.  Cervical AROM: flexion is full, extension limited by 25% with neck pain, Rt sidebending limited by 25% with reproduction of Lt UE symptoms, Lt sidebending is full with neck pain.  Rotation bilaterally is full without significant pain.     AROM Assessment Site Shoulder;Cervical   Strength   Overall Strength Deficits   Overall Strength Comments 4+/5 Rt=Lt UE strength.  Pt reported Lt UE pain with resisted testing   Strength Assessment Site Shoulder   Palpation   Palpation Pt with diffuse palpable tenderness and active trigger points in upper trap and cervcial paraspinals bilaterally.  Unable to assess PA mobility in the cervical spine due to tenderness to palpation.     Ambulation/Gait   Ambulation/Gait Yes   Ambulation/Gait Assistance 7: Independent                   [facility] Adult PT Treatment/Exercise - 07/29/14 0001    Modalities   Modalities Electrical Stimulation;Moist Heat   Moist Heat Therapy   Number Minutes Moist Heat 15 Minutes   Moist Heat Location Other (comment)  cervical   Electrical Stimulation   Electrical Stimulation Location cervical   Electrical Stimulation Action IFC   Electrical Stimulation Parameters 15 minutes   Electrical Stimulation Goals Pain                PT Education - 07/29/14 1605    Education provided Yes   Education Details HEP: cervical AROM   Person(s) Educated Patient   Methods Explanation;Handout;Demonstration   Comprehension Verbalized understanding;Returned demonstration          PT Short Term Goals - 07/29/14 1614    PT SHORT TERM GOAL #1   Title be independent in initial HEP   Time 4   Period Weeks   Status New   PT SHORT TERM GOAL #2   Title report a 25% reduction in Lt UE radiculopathy with ADLs and self-care   Time 4   Period Weeks   Status New   PT SHORT TERM GOAL #3   Title report < or = to 4/10 neck pain with turning head   Time 4   Period Weeks   Status New           PT Long Term  Goals - 07/29/14 1527    PT LONG TERM GOAL #1   Title be independent in advanced HEP   Time 8   Period Weeks   Status New   PT LONG TERM GOAL #2   Title reduce FOTO to < or = to 35% limitation   Time 8   Period Weeks  Status New   PT LONG TERM GOAL #3   Title report a 50% reduction in Lt UE radiculopathy with ADLs and self-care   Time 8   Period Weeks   Status New   PT LONG TERM GOAL #4   Title report 60% reduction in neck pain with turning head   Time 8   Period Weeks   Status New               Plan - Aug 24, 2014 1611    Clinical Impression Statement Pt presents with significant Lt UE radiculopathy and neck pain.  Pt with postural abnormality and limited cervical AROM and widespread pain.  Pt will benefit from PT to address posture, flexibility and traction for UE radiculopathy.     Pt will benefit from skilled therapeutic intervention in order to improve on the following deficits Impaired flexibility;Postural dysfunction;Pain;Other (comment)  UE radiculopathy   Rehab Potential Good   PT Frequency 2x / week  1x likely due to copay   PT Duration 8 weeks   PT Treatment/Interventions Moist Heat;ADLs/Self Care Home Management;Therapeutic activities;Patient/family education;Therapeutic exercise;Passive range of motion;Manual techniques;Ultrasound;Cryotherapy;Neuromuscular re-education;Traction;Electrical Stimulation   PT Next Visit Plan Try cervical traction, decompression exercises, postural strength, manual and modalities   Consulted and Agree with Plan of Care Patient          G-Codes - 2014/08/24 1527    Functional Assessment Tool Used FOTO: 51% limitation   Functional Limitation Changing and maintaining body position   Changing and Maintaining Body Position Current Status (Y2334) At least 40 percent but less than 60 percent impaired, limited or restricted   Changing and Maintaining Body Position Goal Status (D5686) At least 20 percent but less than 40 percent impaired,  limited or restricted       Problem List Patient Active Problem List   Diagnosis Date Noted  . Abnormal TSH 09/23/2013  . Adynamic ileus 09/22/2013  . Obesity (BMI 30-39.9) 09/22/2013  . Abdominal pain, epigastric 09/22/2013  . Transaminitis 09/22/2013  . Cervical arthritis 09/22/2013  . Chronic constipation 09/22/2013  . Fibromyalgia     Alania Overholt, PT Aug 24, 2014, 4:24 PM PHYSICAL THERAPY DISCHARGE SUMMARY  Visits from Start of Care: 1  Current functional level related to goals / functional outcomes: Pt cancelled all remaining appts as she reported that her arm was no longer bothering her.   Remaining deficits: See evaluation.  Thank you for this referral.    Education / Equipment: HEP Plan: Patient agrees to discharge.  Patient goals were not met. Patient is being discharged due to being pleased with the current functional level.  ?????    Sigurd Sos, PT 09/17/2014 9:23 AM [facility] [address] [place], [place], [address] Phone: [phone]   Fax:  [phone]

## 2014-08-05 ENCOUNTER — Ambulatory Visit: Payer: Commercial Managed Care - HMO | Admitting: Physical Therapy

## 2014-08-12 ENCOUNTER — Ambulatory Visit: Payer: Commercial Managed Care - HMO | Admitting: Physical Therapy

## 2014-08-19 ENCOUNTER — Encounter: Payer: Commercial Managed Care - HMO | Admitting: Physical Therapy

## 2014-08-19 ENCOUNTER — Emergency Department (HOSPITAL_COMMUNITY): Payer: Commercial Managed Care - HMO

## 2014-08-19 ENCOUNTER — Emergency Department (HOSPITAL_COMMUNITY)
Admission: EM | Admit: 2014-08-19 | Discharge: 2014-08-19 | Disposition: A | Discharge status: Home / Self Care | Payer: Commercial Managed Care - HMO | Attending: Emergency Medicine | Admitting: Emergency Medicine

## 2014-08-19 ENCOUNTER — Encounter (HOSPITAL_COMMUNITY): Payer: Self-pay | Admitting: Emergency Medicine

## 2014-08-19 DIAGNOSIS — M797 Fibromyalgia: Secondary | ICD-10-CM | POA: Insufficient documentation

## 2014-08-19 DIAGNOSIS — Z8719 Personal history of other diseases of the digestive system: Secondary | ICD-10-CM | POA: Insufficient documentation

## 2014-08-19 DIAGNOSIS — J159 Unspecified bacterial pneumonia: Secondary | ICD-10-CM | POA: Insufficient documentation

## 2014-08-19 DIAGNOSIS — F410 Panic disorder [episodic paroxysmal anxiety] without agoraphobia: Secondary | ICD-10-CM | POA: Diagnosis not present

## 2014-08-19 DIAGNOSIS — J189 Pneumonia, unspecified organism: Secondary | ICD-10-CM | POA: Diagnosis not present

## 2014-08-19 DIAGNOSIS — M1990 Unspecified osteoarthritis, unspecified site: Secondary | ICD-10-CM | POA: Diagnosis not present

## 2014-08-19 DIAGNOSIS — R05 Cough: Secondary | ICD-10-CM | POA: Diagnosis not present

## 2014-08-19 MED ORDER — KETOROLAC TROMETHAMINE 30 MG/ML IJ SOLN
30.0000 mg | Freq: Once | INTRAMUSCULAR | Status: AC
  Administered 2014-08-19: 30 mg via INTRAVENOUS
  Filled 2014-08-19: qty 1

## 2014-08-19 MED ORDER — AZITHROMYCIN 250 MG PO TABS: 250.0000 mg | ORAL_TABLET | Freq: Every day | ORAL | Status: DC

## 2014-08-19 MED ORDER — CEFTRIAXONE SODIUM 1 G IJ SOLR
1.0000 g | Freq: Once | INTRAMUSCULAR | Status: AC
  Administered 2014-08-19: 1 g via INTRAVENOUS
  Filled 2014-08-19: qty 10

## 2014-08-19 MED ORDER — SODIUM CHLORIDE 0.9 % IV BOLUS (SEPSIS)
1000.0000 mL | Freq: Once | INTRAVENOUS | Status: AC
  Administered 2014-08-19: 1000 mL via INTRAVENOUS

## 2014-08-19 MED ORDER — BENZONATATE 100 MG PO CAPS: 100.0000 mg | ORAL_CAPSULE | Freq: Three times a day (TID) | ORAL | Status: DC

## 2014-08-19 MED ORDER — FENTANYL CITRATE (PF) 100 MCG/2ML IJ SOLN
100.0000 ug | Freq: Once | INTRAMUSCULAR | Status: AC
  Administered 2014-08-19: 100 ug via INTRAVENOUS
  Filled 2014-08-19: qty 2

## 2014-08-19 MED ORDER — NAPROXEN 500 MG PO TABS: 500.0000 mg | ORAL_TABLET | Freq: Two times a day (BID) | ORAL | Status: DC

## 2014-08-19 MED ORDER — BENZONATATE 100 MG PO CAPS
100.0000 mg | ORAL_CAPSULE | Freq: Once | ORAL | Status: AC
  Administered 2014-08-19: 100 mg via ORAL
  Filled 2014-08-19: qty 1

## 2014-08-19 MED ORDER — HYDROCODONE-ACETAMINOPHEN 5-325 MG PO TABS
2.0000 | ORAL_TABLET | ORAL | Status: DC | PRN
Start: 2014-08-19 — End: 2014-11-25

## 2014-08-19 NOTE — Discharge Instructions (Signed)
Please call your doctor for a followup appointment within 24-48 hours. When you talk to your doctor please let them know that you were seen in the emergency department and have them acquire all of your records so that they can discuss the findings with you and formulate a treatment plan to fully care for your new and ongoing problems.  Your xray shows a small early pneumonia

## 2014-08-19 NOTE — ED Notes (Signed)
Pt reports productive cough for past 2 days with fever of 101F at home and some diarrhea. Accompanied by generalized body aches and HA.

## 2014-08-19 NOTE — ED Provider Notes (Signed)
CSN: [id number]     Arrival date & time 08/19/14  0849 History   First MD Initiated Contact with Patient 08/19/14 1114     Chief Complaint  Patient presents with  . Cough  . Generalized Body Aches  . Headache     (Consider location/radiation/quality/duration/timing/severity/associated sxs/prior Treatment) HPI Comments: The patient is a 54 year old female, she has a history of approximately 2 days of cough, fever as high as 101 at home, sore throat, body aches and chills. Her significant other had similar symptoms over the weekend that resolved spontaneously. Nothing makes this better or worse, no medications taken prior to arrival, no associated abdominal pain or vomiting  Patient is a 54 y.o. female presenting with cough and headaches. The history is provided by the patient.  Cough Associated symptoms: headaches   Headache Associated symptoms: cough     Past Medical History  Diagnosis Date  . Fibromyalgia   . Spinal stenosis   . Panic attack   . Barrett esophagus   . Paraesophageal hiatal hernia     repaired 2009  . Cervical arthritis 09/22/2013  . H/O hiatal hernia   . Headache(784.0)     migraines  . Fibromyalgia    Past Surgical History  Procedure Laterality Date  . Tubal ligation  1990s?  Marland Kitchen Cholecystectomy  1980s?  . Vaginal hysterectomy  03/04/2001  . Ankle surgery    . Laparoscopic paraesophageal hernia repair  03/05/2008    Dr Johney Maine  . Laparoscopic nissen fundoplication  56/31/4970  . Eus N/A 11/19/2013    Procedure: ESOPHAGEAL ENDOSCOPIC ULTRASOUND (EUS) RADIAL;  Surgeon: Arta Silence, MD;  Location: [facility];  Service: Endoscopy;  Laterality: N/A;   History reviewed. No pertinent family history. History  Substance Use Topics  . Smoking status: Never Smoker   . Smokeless tobacco: Never Used  . Alcohol Use: Yes     Comment: wine occassionally   OB History    No data available     Review of Systems  Respiratory: Positive for cough.    Neurological: Positive for headaches.  All other systems reviewed and are negative.     Allergies  Codeine; Compazine; and Sulfa antibiotics  Home Medications   Prior to Admission medications   Medication Sig Start Date End Date Taking? Authorizing Provider  ALPRAZolam Duanne Moron) 1 MG tablet Take 2 mg by mouth 3 (three) times daily as needed for anxiety (anxiety). 06/20/12  Yes Virgel Manifold, MD  gabapentin (NEURONTIN) 600 MG tablet Take 1 tablet (600 mg total) by mouth 3 (three) times daily. 06/20/12  Yes Virgel Manifold, MD  levothyroxine (SYNTHROID, LEVOTHROID) 25 MCG tablet Take 1 tablet (25 mcg total) by mouth daily before breakfast. 09/25/13  Yes Theodis Blaze, MD  omega-3 acid ethyl esters (LOVAZA) 1 G capsule Take 1 g by mouth 2 (two) times daily.   Yes Historical Provider, MD  QUEtiapine (SEROQUEL) 400 MG tablet Take 800 mg by mouth at bedtime.   Yes Historical Provider, MD  SUMAtriptan (IMITREX) 100 MG tablet Take 100 mg by mouth every 2 (two) hours as needed for migraine. May repeat in 2 hours if headache persists or recurs.   Yes Historical Provider, MD  traMADol (ULTRAM) 50 MG tablet Take 2 tablets (100 mg total) by mouth every 6 (six) hours as needed for pain. Patient taking differently: Take 100 mg by mouth every 6 (six) hours as needed for moderate pain.  06/20/12  Yes Virgel Manifold, MD  venlafaxine XR (EFFEXOR-XR) 75 MG 24  hr capsule Take 75 mg by mouth 3 (three) times daily.   Yes Historical Provider, MD  azithromycin (ZITHROMAX Z-PAK) 250 MG tablet Take 1 tablet (250 mg total) by mouth daily. 500mg  PO day 1, then 250mg  PO days 205 08/19/14   Noemi Chapel, MD  benzonatate (TESSALON) 100 MG capsule Take 1 capsule (100 mg total) by mouth every 8 (eight) hours. 08/19/14   Noemi Chapel, MD  HYDROcodone-acetaminophen (NORCO/VICODIN) 5-325 MG per tablet Take 2 tablets by mouth every 4 (four) hours as needed. 08/19/14   Noemi Chapel, MD  naproxen (NAPROSYN) 500 MG tablet Take 1 tablet (500  mg total) by mouth 2 (two) times daily with a meal. 08/19/14   Noemi Chapel, MD  oxyCODONE-acetaminophen (PERCOCET/ROXICET) 5-325 MG per tablet Take 2 tablets by mouth every 4 (four) hours as needed for severe pain. Patient not taking: Reported on 07/29/2014 07/04/14   Ottie Glazier, PA-C  saccharomyces boulardii (FLORASTOR) 250 MG capsule Take 1 capsule (250 mg total) by mouth 2 (two) times daily. Patient not taking: Reported on 07/04/2014 09/25/13   Theodis Blaze, MD   BP 102/74 mmHg  Pulse 79  Temp(Src) 98.1 F (36.7 C) (Oral)  Resp 18  SpO2 96% Physical Exam  Constitutional: She appears well-developed and well-nourished. No distress.  HENT:  Head: Normocephalic and atraumatic.  Mouth/Throat: Oropharynx is clear and moist. No oropharyngeal exudate.  Eyes: Conjunctivae and EOM are normal. Pupils are equal, round, and reactive to light. Right eye exhibits no discharge. Left eye exhibits no discharge. No scleral icterus.  Neck: Normal range of motion. Neck supple. No JVD present. No thyromegaly present.  Cardiovascular: Normal rate, regular rhythm, normal heart sounds and intact distal pulses.  Exam reveals no gallop and no friction rub.   No murmur heard. Pulmonary/Chest: Effort normal and breath sounds normal. No respiratory distress. She has no wheezes. She has no rales.  Abdominal: Soft. Bowel sounds are normal. She exhibits no distension and no mass. There is no tenderness.  Musculoskeletal: Normal range of motion. She exhibits no edema or tenderness.  Lymphadenopathy:    She has no cervical adenopathy.  Neurological: She is alert. Coordination normal.  Skin: Skin is warm and dry. No rash noted. No erythema.  Psychiatric: She has a normal mood and affect. Her behavior is normal.  Nursing note and vitals reviewed.   ED Course  Procedures (including critical care time) Labs Review Labs Reviewed - No data to display  Imaging Review Dg Chest 2 View (if Patient Has Fever And/or  Copd)  08/19/2014   CLINICAL DATA:  Cough, congestion, and generalized body aches for 2-3 days. Headache.  EXAM: CHEST  2 VIEW  COMPARISON:  None.  FINDINGS: The cardiomediastinal silhouette is within normal limits. There is central airway thickening. There is also patchy airspace opacity in the left lower lobe. No right lung consolidation is seen. No pleural effusion or pneumothorax is identified. Upper abdominal surgical clips are noted. No acute osseous abnormality is seen.  IMPRESSION: Left lower lobe infiltrate suspicious for pneumonia. Followup PA and lateral chest X-ray is recommended in 3-4 weeks following trial of antibiotic therapy to ensure resolution and exclude underlying malignancy.   Electronically Signed   By: Logan Bores   On: 08/19/2014 09:53     MDM   Final diagnoses:  CAP (community acquired pneumonia)    The patient's exam is rather benign, she is no longer tachycardic, she is not febrile, her lungs are clear, x-ray 2 view PA  and lateral views of the chest shows a left lower lobe infiltrate. She will be treated with antibiotics  Filed Vitals:   08/19/14 0904 08/19/14 1128 08/19/14 1247  BP: 96/72 113/64 109/71  Pulse: 113 92 93  Temp: 98.5 F (36.9 C) 98.6 F (37 C)   TempSrc: Oral Oral   Resp: 20 18 18   SpO2: 96% 94% 94%   Improved with fluids, rocephin given, pain meds for headache, anticipate d/c.  No hypotension.  Appears comfortable on reexam, no wheezign, no inc wob.  meds given, improved, stable for d/c.  Meds given in ED:  Medications  ketorolac (TORADOL) 30 MG/ML injection 30 mg (30 mg Intravenous Given 08/19/14 1155)  sodium chloride 0.9 % bolus 1,000 mL (0 mLs Intravenous Stopped 08/19/14 1245)  benzonatate (TESSALON) capsule 100 mg (100 mg Oral Given 08/19/14 1155)  cefTRIAXone (ROCEPHIN) 1 g in dextrose 5 % 50 mL IVPB (0 g Intravenous Stopped 08/19/14 1315)  fentaNYL (SUBLIMAZE) injection 100 mcg (100 mcg Intravenous Given 08/19/14 1324)    New  Prescriptions   AZITHROMYCIN (ZITHROMAX Z-PAK) 250 MG TABLET    Take 1 tablet (250 mg total) by mouth daily. 500mg  PO day 1, then 250mg  PO days 205   BENZONATATE (TESSALON) 100 MG CAPSULE    Take 1 capsule (100 mg total) by mouth every 8 (eight) hours.   HYDROCODONE-ACETAMINOPHEN (NORCO/VICODIN) 5-325 MG PER TABLET    Take 2 tablets by mouth every 4 (four) hours as needed.   NAPROXEN (NAPROSYN) 500 MG TABLET    Take 1 tablet (500 mg total) by mouth 2 (two) times daily with a meal.      Noemi Chapel, MD 08/19/14 1454

## 2014-08-21 DIAGNOSIS — J189 Pneumonia, unspecified organism: Secondary | ICD-10-CM | POA: Diagnosis not present

## 2014-08-21 DIAGNOSIS — R05 Cough: Secondary | ICD-10-CM | POA: Diagnosis not present

## 2014-08-21 DIAGNOSIS — R938 Abnormal findings on diagnostic imaging of other specified body structures: Secondary | ICD-10-CM | POA: Diagnosis not present

## 2014-08-21 DIAGNOSIS — N63 Unspecified lump in breast: Secondary | ICD-10-CM | POA: Diagnosis not present

## 2014-09-04 DIAGNOSIS — R740 Nonspecific elevation of levels of transaminase and lactic acid dehydrogenase [LDH]: Secondary | ICD-10-CM | POA: Diagnosis not present

## 2014-09-04 DIAGNOSIS — R1011 Right upper quadrant pain: Secondary | ICD-10-CM | POA: Diagnosis not present

## 2014-11-25 ENCOUNTER — Encounter: Payer: Self-pay | Admitting: Adult Health

## 2014-11-25 ENCOUNTER — Ambulatory Visit (INDEPENDENT_AMBULATORY_CARE_PROVIDER_SITE_OTHER): Payer: Commercial Managed Care - HMO | Admitting: Adult Health

## 2014-11-25 VITALS — BP 104/78 | HR 0 | Temp 98.3°F | Ht 64.0 in | Wt 189.6 lb

## 2014-11-25 DIAGNOSIS — E039 Hypothyroidism, unspecified: Secondary | ICD-10-CM | POA: Insufficient documentation

## 2014-11-25 DIAGNOSIS — Z7189 Other specified counseling: Secondary | ICD-10-CM | POA: Diagnosis not present

## 2014-11-25 DIAGNOSIS — Z7689 Persons encountering health services in other specified circumstances: Secondary | ICD-10-CM

## 2014-11-25 LAB — TSH: TSH: 2.81 u[IU]/mL (ref 0.35–4.50)

## 2014-11-25 NOTE — Progress Notes (Signed)
HPI:  Leslie Benson is here to establish care.  Last PCP and physical: January 2016 with Dr. Brigitte Pulse at [facility]. She endorses being discharged from that practice due to "missing an appointment."  Has the following chronic problems that require follow up and concerns today:  Anxiety - She is followed by psych for her anxiety and fibromyalgia. She endorses " it is controlled as much as it can be." She is on disability due to her anxiety.   Hypothyroidism  - Synthroid 25 mg. Per patient, has not had thyroid checked in over a year and at that time is as 12.4  ROS negative for unless reported above: fevers, chills,feeling poorly, unintentional weight loss, hearing or vision loss, chest pain, palpitations, leg claudication, struggling to breath,Not feeling congested in the chest, no orthopenia, no cough,no wheezing, normal appetite, no soft tissue swelling, no hemoptysis, melena, hematochezia, hematuria, falls, loc, si, or thoughts of self harm.  Immunizations:UTD Diet: Smoothies, vegetables, Denies eating out a lot, fast foods.  Exercise: Takes her dog for walks. She has stationary bicycle but does not use it.  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 This corresponds to the mass is seen at recent CT. No evidence of malignancy. Dentist:  Eye doctor:  Past Medical History  Diagnosis Date  . Fibromyalgia   . Spinal stenosis   . Panic attack   . Barrett esophagus   . Paraesophageal hiatal hernia     repaired 2009  . Cervical arthritis 09/22/2013  . H/O hiatal hernia   . Headache(784.0)     migraines  . Fibromyalgia     Past Surgical History  Procedure Laterality Date  . Tubal ligation  1990s?  Marland Kitchen Cholecystectomy  1980s?  . Vaginal hysterectomy  03/04/2001  . Ankle surgery    . Laparoscopic paraesophageal hernia repair  03/05/2008    Dr Johney Maine  . Laparoscopic  nissen fundoplication  40/98/1191  . Eus N/A 11/19/2013    Procedure: ESOPHAGEAL ENDOSCOPIC ULTRASOUND (EUS) RADIAL;  Surgeon: Arta Silence, MD;  Location: [facility];  Service: Endoscopy;  Laterality: N/A;    No family history on file.  History   Social History  . Marital Status: Married    Spouse Name: N/A  . Number of Children: N/A  . Years of Education: N/A   Social History Main Topics  . Smoking status: Never Smoker   . Smokeless tobacco: Never Used  . Alcohol Use: 0.0 oz/week    0 Standard drinks or equivalent per week     Comment: wine occassionally  . Drug Use: No  . Sexual Activity: Yes    Birth Control/ Protection: None     Comment: n/a partial hysterectomy   Other Topics Concern  . None   Social History Narrative   Divorced x 4   Lived in [place].  Moved to [place] in early 2000s     Current outpatient prescriptions:  .  ALPRAZolam (XANAX) 1 MG tablet, Take by mouth. Take 1 to 2 tablets by mouth three times a day as needed., Disp: , Rfl:  .  gabapentin (NEURONTIN) 600 MG tablet, Take 1 tablet (600 mg total) by mouth 3 (three) times daily. (Patient taking differently: Take 600 mg by mouth 6 (six) times daily. Dr. Toy Care), Disp: 90 tablet, Rfl: 0 .  levothyroxine (SYNTHROID, LEVOTHROID) 25 MCG tablet, Take 1 tablet (25 mcg total) by mouth daily before breakfast., Disp:  30 tablet, Rfl: 1 .  omega-3 acid ethyl esters (LOVAZA) 1 G capsule, Take 1 g by mouth 2 (two) times daily., Disp: , Rfl:  .  QUEtiapine (SEROQUEL) 400 MG tablet, Take 800 mg by mouth at bedtime., Disp: , Rfl:  .  saccharomyces boulardii (FLORASTOR) 250 MG capsule, Take 1 capsule (250 mg total) by mouth 2 (two) times daily., Disp: 30 capsule, Rfl: 1 .  traMADol (ULTRAM) 50 MG tablet, Take 2 tablets (100 mg total) by mouth every 6 (six) hours as needed for pain. (Patient taking differently: Take 100 mg by mouth every 6 (six) hours as needed for moderate pain. Dr. Toy Care), Disp: 30 tablet, Rfl: 0 .   venlafaxine XR (EFFEXOR-XR) 75 MG 24 hr capsule, 3 (three) times daily. Take 3 capsules by mouth every morning., Disp: , Rfl:   EXAM:  Filed Vitals:   11/25/14 1348  BP: 104/78  Pulse: 0  Temp: 98.3 F (36.8 C)    Body mass index is 32.53 kg/(m^2).  GENERAL: vitals reviewed and listed above, alert, oriented, appears well hydrated and in no acute distress. She is slightly obese  HEENT: atraumatic, conjunttiva clear, no obvious abnormalities on inspection of external nose and ears  NECK: Neck is soft and supple without masses, no adenopathy or thyromegaly, trachea midline, no JVD. Normal range of motion.   LUNGS: clear to auscultation bilaterally, no wheezes, rales or rhonchi, good air movement  CV: Regular rate and rhythm, normal S1/S2, no audible murmurs, gallops, or rubs. No carotid bruit and no peripheral edema.   MS: moves all extremities without noticeable abnormality. No edema noted  Abd: soft/nontender/nondistended/normal bowel sounds   Skin: warm and dry, no rash   Extremities: No clubbing, cyanosis, or edema. Capillary refill is WNL. Pulses intact bilaterally in upper and lower extremities.   Neuro: CN II-XII intact, sensation and reflexes normal throughout, 5/5 muscle strength in bilateral upper and lower extremities. Normal finger to nose. Normal rapid alternating movements.   PSYCH: pleasant and cooperative, no obvious depression or anxiety. Flat affect and speaks in monotone voice.   ASSESSMENT AND PLAN:  1. Encounter to establish care - Follow up in January 2016 for CPE - Follow up sooner if needed - Continue to eat healthy and start exercising more  2. Hypothyroidism, unspecified hypothyroidism type - TSH - Consider changing synthroid dose   Discussed the following assessment and plan:  No diagnosis found. -We reviewed the PMH, PSH, FH, SH, Meds and Allergies. -We provided refills for any medications we will prescribe as needed. -We addressed  current concerns per orders and patient instructions. -We have asked for records for pertinent exams, studies, vaccines and notes from previous providers. -We have advised patient to follow up per instructions below.   -Patient advised to return or notify a provider immediately if symptoms worsen or persist or new concerns arise.  There are no Patient Instructions on file for this visit.   [employer]

## 2014-11-25 NOTE — Progress Notes (Signed)
Pre visit review using our clinic review tool, if applicable. No additional management support is needed unless otherwise documented below in the visit note. 

## 2014-11-25 NOTE — Patient Instructions (Signed)
It was great meeting you today.   I will follow up with you regarding our blood work.   Follow up with me in January for your next physical or sooner if needed.   Continue to exercise and eat healthy.

## 2014-11-26 ENCOUNTER — Telehealth: Payer: Self-pay | Admitting: Adult Health

## 2014-11-26 NOTE — Telephone Encounter (Signed)
TSH is normal. Sent her a message via Mychart yesterday with results.

## 2014-11-26 NOTE — Telephone Encounter (Signed)
Patient called wanting to know her lab results on her TSH. Please give this patient a call regarding her labs.

## 2014-11-26 NOTE — Telephone Encounter (Signed)
Called and spoke with pt and pt is aware of results.  Pt states she has enough levothyroxine and she will contact the pharmacy when she needs refills.

## 2014-11-26 NOTE — Telephone Encounter (Signed)
Pls advise.  

## 2015-02-02 ENCOUNTER — Encounter: Payer: Self-pay | Admitting: Adult Health

## 2015-02-09 ENCOUNTER — Encounter: Payer: Self-pay | Admitting: Adult Health

## 2015-02-10 ENCOUNTER — Ambulatory Visit (INDEPENDENT_AMBULATORY_CARE_PROVIDER_SITE_OTHER): Payer: Commercial Managed Care - HMO | Admitting: Internal Medicine

## 2015-02-10 ENCOUNTER — Encounter: Payer: Self-pay | Admitting: Internal Medicine

## 2015-02-10 VITALS — BP 114/84 | Temp 98.4°F | Wt 186.0 lb

## 2015-02-10 DIAGNOSIS — R590 Localized enlarged lymph nodes: Secondary | ICD-10-CM | POA: Diagnosis not present

## 2015-02-10 DIAGNOSIS — H9201 Otalgia, right ear: Secondary | ICD-10-CM | POA: Diagnosis not present

## 2015-02-10 DIAGNOSIS — J069 Acute upper respiratory infection, unspecified: Secondary | ICD-10-CM | POA: Diagnosis not present

## 2015-02-10 MED ORDER — AMOXICILLIN-POT CLAVULANATE 875-125 MG PO TABS: 1.0000 | ORAL_TABLET | Freq: Two times a day (BID) | ORAL | Status: DC

## 2015-02-10 NOTE — Patient Instructions (Signed)
Add antibiotic for possible sinus infection  Bacterial infection causing the swollen  Gland that is referred causing ear pain.  Expect improvment in the next 3-4 days  If not fu with PCP.   Lung exam is normal today

## 2015-02-10 NOTE — Progress Notes (Signed)
Pre visit review using our clinic review tool, if applicable. No additional management support is needed unless otherwise documented below in the visit note.  Chief Complaint  Patient presents with  . Sore Throat  . Neck Swelling  . Right Ear Pain    HPI: Patient Leslie Benson  comes in today for SDA for  new problem evaluation. PCP NA  "Sick for a month "a cold and then took  Then coughing   And now  Ear pain and lymph node  Tender . About 2 weeks.   Chest is better  .  Now increasing Ear pain below jaw and to ear and hoots up right side of head .  No pain with chewing or teeth pain .   nohx of mono .  ? If had fever chills .  advil  Today  Uses q tips no sx with this  hearing no change ROS: See pertinent positives and negatives per HPI. No nvd rash vision change  Past Medical History  Diagnosis Date  . Fibromyalgia   . Spinal stenosis   . Panic attack   . Barrett esophagus   . Paraesophageal hiatal hernia     repaired 2009  . Cervical arthritis ([place]) 09/22/2013  . H/O hiatal hernia   . Headache(784.0)     migraines  . Depression   . GERD (gastroesophageal reflux disease)   . High cholesterol   . Migraines   . Thyroid disease   . Fibromyalgia   . Lymphocytic colitis   . Breast nodule     benign  . Elevated liver enzymes     Family History  Problem Relation Age of Onset  . Alcohol abuse    . Colon cancer    . Elevated Lipids    . Heart disease      Social History   Social History  . Marital Status: Married    Spouse Name: N/A  . Number of Children: N/A  . Years of Education: N/A   Social History Main Topics  . Smoking status: Never Smoker   . Smokeless tobacco: Never Used  . Alcohol Use: 0.0 oz/week    0 Standard drinks or equivalent per week     Comment: wine occassionally  . Drug Use: No  . Sexual Activity: Yes    Birth Control/ Protection: None     Comment: n/a partial hysterectomy   Other Topics Concern  . None   Social History  Narrative   Married    Lived in [place].  Moved to [place] in early 2000s   She is on disability for anxiety     Outpatient Prescriptions Prior to Visit  Medication Sig Dispense Refill  . ALPRAZolam (XANAX) 1 MG tablet Take by mouth. Take 1 to 2 tablets by mouth three times a day as needed.    . gabapentin (NEURONTIN) 600 MG tablet Take 1 tablet (600 mg total) by mouth 3 (three) times daily. (Patient taking differently: Take 600 mg by mouth 6 (six) times daily. Dr. Toy Care) 90 tablet 0  . levothyroxine (SYNTHROID, LEVOTHROID) 25 MCG tablet Take 1 tablet (25 mcg total) by mouth daily before breakfast. 30 tablet 1  . omega-3 acid ethyl esters (LOVAZA) 1 G capsule Take 1 g by mouth 2 (two) times daily.    . QUEtiapine (SEROQUEL) 400 MG tablet Take 800 mg by mouth at bedtime.    . traMADol (ULTRAM) 50 MG tablet Take 2 tablets (100 mg total) by mouth every 6 (six)  hours as needed for pain. (Patient taking differently: Take 100 mg by mouth every 6 (six) hours as needed for moderate pain. Dr. Toy Care) 30 tablet 0  . venlafaxine XR (EFFEXOR-XR) 75 MG 24 hr capsule 3 (three) times daily. Take 3 capsules by mouth every morning.    . saccharomyces boulardii (FLORASTOR) 250 MG capsule Take 1 capsule (250 mg total) by mouth 2 (two) times daily. (Patient not taking: Reported on 02/10/2015) 30 capsule 1   No facility-administered medications prior to visit.     EXAM:  BP 114/84 mmHg  Temp(Src) 98.4 F (36.9 C) (Oral)  Wt 186 lb (84.369 kg)  Body mass index is 31.91 kg/(m^2).  GENERAL: vitals reviewed and listed above, alert, oriented, appears well hydrated and in no acute distress looks tired mild congestion HEENT: atraumatic, conjunctiva  clear, no obvious abnormalities on inspection of external nose and earstms  Intact no acute changes   eac devoid of wax red but no edema OP : no lesion edema or exudate mild erythema cobblestoning  Teeth i ok repair NECK:  Supple   Very tender right sb area swelling    No redness  No sinus pain or facial tenderness otherwise LUNGS: clear to auscultation bilaterally, no wheezes, rales or rhonchi, good air movement CV: HRRR, no clubbing cyanosis or  peripheral edema nl cap refill  MS: moves all extremities without noticeable focal  Abnormality Nl cognition and speech  ASSESSMENT AND PLAN:  Discussed the following assessment and plan:  Protracted URI - poss underlying sinusitis with secondary sx  Cervical adenopathy?  - ? sm vs salivary    Otalgia, right - referred Empiric rx for sinusitis  Secondary adenopathy although dental health looks good  Pt cautioned to avoid q tips in eacs -Patient advised to return or notify health care team  if symptoms worsen ,persist or new concerns arise.  Patient Instructions  Add antibiotic for possible sinus infection  Bacterial infection causing the swollen  Gland that is referred causing ear pain.  Expect improvment in the next 3-4 days  If not fu with PCP.   Lung exam is normal today   Mariann Laster K. Jaydyn Bozzo M.D.

## 2015-04-23 ENCOUNTER — Telehealth: Payer: Self-pay | Admitting: Adult Health

## 2015-04-23 MED ORDER — LEVOTHYROXINE SODIUM 25 MCG PO TABS: 25.0000 ug | ORAL_TABLET | Freq: Every day | ORAL | Status: DC

## 2015-04-23 NOTE — Telephone Encounter (Signed)
Rx sent to pharmacy   

## 2015-04-23 NOTE — Telephone Encounter (Signed)
Pt request refill of the following: levothyroxine (SYNTHROID, LEVOTHROID) 25 MCG tablet   Phamacy: [facility]

## 2015-08-05 ENCOUNTER — Other Ambulatory Visit (INDEPENDENT_AMBULATORY_CARE_PROVIDER_SITE_OTHER): Payer: Commercial Managed Care - HMO

## 2015-08-05 DIAGNOSIS — Z0000 Encounter for general adult medical examination without abnormal findings: Secondary | ICD-10-CM

## 2015-08-05 LAB — BASIC METABOLIC PANEL
BUN: 19 mg/dL (ref 6–23)
CALCIUM: 9.6 mg/dL (ref 8.4–10.5)
CO2: 27 mEq/L (ref 19–32)
Chloride: 105 mEq/L (ref 96–112)
Creatinine, Ser: 1.07 mg/dL (ref 0.40–1.20)
GFR: 56.27 mL/min — AB (ref >60.00)
GLUCOSE: 99 mg/dL (ref 70–99)
POTASSIUM: 3.8 meq/L (ref 3.5–5.1)
Sodium: 140 mEq/L (ref 135–145)

## 2015-08-05 LAB — HEPATIC FUNCTION PANEL
ALK PHOS: 116 U/L (ref 39–117)
ALT: 31 U/L (ref 0–35)
AST: 19 U/L (ref 0–37)
Albumin: 4.3 g/dL (ref 3.5–5.2)
BILIRUBIN DIRECT: 0.1 mg/dL (ref 0.0–0.3)
BILIRUBIN TOTAL: 0.4 mg/dL (ref 0.2–1.2)
Total Protein: 7 g/dL (ref 6.0–8.3)

## 2015-08-05 LAB — POC URINALSYSI DIPSTICK (AUTOMATED)
BILIRUBIN UA: NEGATIVE
Glucose, UA: NEGATIVE
KETONES UA: NEGATIVE
Nitrite, UA: NEGATIVE
PH UA: 5.5
RBC UA: NEGATIVE
Urobilinogen, UA: 0.2

## 2015-08-05 LAB — CBC WITH DIFFERENTIAL/PLATELET
BASOS ABS: 0 10*3/uL (ref 0.0–0.1)
Basophils Relative: 0.6 % (ref 0.0–3.0)
EOS PCT: 2.8 % (ref 0.0–5.0)
Eosinophils Absolute: 0.2 10*3/uL (ref 0.0–0.7)
HCT: 42.5 % (ref 36.0–46.0)
Hemoglobin: 14.4 g/dL (ref 12.0–15.0)
LYMPHS ABS: 3.5 10*3/uL (ref 0.7–4.0)
Lymphocytes Relative: 59.9 % — ABNORMAL HIGH (ref 12.0–46.0)
MCHC: 34 g/dL (ref 30.0–36.0)
MCV: 92.1 fl (ref 78.0–100.0)
MONO ABS: 0.5 10*3/uL (ref 0.1–1.0)
MONOS PCT: 8.5 % (ref 3.0–12.0)
NEUTROS PCT: 28.2 % — AB (ref 43.0–77.0)
Neutro Abs: 1.6 10*3/uL (ref 1.4–7.7)
Platelets: 223 10*3/uL (ref 150.0–400.0)
RBC: 4.62 Mil/uL (ref 3.87–5.11)
RDW: 13 % (ref 11.5–15.5)
WBC: 5.8 10*3/uL (ref 4.0–10.5)

## 2015-08-05 LAB — LIPID PANEL
CHOL/HDL RATIO: 3
Cholesterol: 239 mg/dL — ABNORMAL HIGH (ref 0–200)
HDL: 71.6 mg/dL (ref >39.00)
LDL CALC: 148 mg/dL — AB (ref 0–99)
NONHDL: 167.03
Triglycerides: 94 mg/dL (ref 0.0–149.0)
VLDL: 18.8 mg/dL (ref 0.0–40.0)

## 2015-08-05 LAB — TSH: TSH: 3.72 u[IU]/mL (ref 0.35–4.50)

## 2015-08-13 ENCOUNTER — Encounter: Payer: Self-pay | Admitting: Adult Health

## 2015-08-13 ENCOUNTER — Ambulatory Visit (INDEPENDENT_AMBULATORY_CARE_PROVIDER_SITE_OTHER): Payer: Commercial Managed Care - HMO | Admitting: Adult Health

## 2015-08-13 DIAGNOSIS — Z780 Asymptomatic menopausal state: Secondary | ICD-10-CM

## 2015-08-13 DIAGNOSIS — R944 Abnormal results of kidney function studies: Secondary | ICD-10-CM

## 2015-08-13 DIAGNOSIS — Z0000 Encounter for general adult medical examination without abnormal findings: Secondary | ICD-10-CM

## 2015-08-13 DIAGNOSIS — R5383 Other fatigue: Secondary | ICD-10-CM

## 2015-08-13 DIAGNOSIS — R1011 Right upper quadrant pain: Secondary | ICD-10-CM

## 2015-08-13 DIAGNOSIS — Z0001 Encounter for general adult medical examination with abnormal findings: Secondary | ICD-10-CM | POA: Diagnosis not present

## 2015-08-13 LAB — BASIC METABOLIC PANEL
BUN: 15 mg/dL (ref 6–23)
CHLORIDE: 101 meq/L (ref 96–112)
CO2: 29 mEq/L (ref 19–32)
Calcium: 9.6 mg/dL (ref 8.4–10.5)
Creatinine, Ser: 1.19 mg/dL (ref 0.40–1.20)
GFR: 49.77 mL/min — ABNORMAL LOW (ref >60.00)
Glucose, Bld: 103 mg/dL — ABNORMAL HIGH (ref 70–99)
POTASSIUM: 3.7 meq/L (ref 3.5–5.1)
Sodium: 140 mEq/L (ref 135–145)

## 2015-08-13 NOTE — Progress Notes (Signed)
Subjective:    Patient ID: Leslie Benson, female    DOB: May 11, 1960, 55 y.o.   MRN: [id number]  HPI  Patient presents for yearly preventative medicine examination. She is a caucasian female  has a past medical history of Fibromyalgia; Spinal stenosis; Panic attack; Barrett esophagus; Paraesophageal hiatal hernia; Cervical arthritis (HCC) (09/22/2013); H/O hiatal hernia; Headache(784.0); Depression; GERD (gastroesophageal reflux disease); High cholesterol; Migraines; Thyroid disease; Fibromyalgia; Lymphocytic colitis; Breast nodule; and Elevated liver enzymes.  Medicare questionnaire was completed and reviewed  All immunizations and health maintenance protocols were reviewed with the patient and needed orders were placed.  Medication reconciliation,  past medical history, social history, problem list and allergies were reviewed in detail with the patient  Goals were established with regard to weight loss, exercise, and  diet in compliance with medications  End of life planning was discussed  She is walking and is trying to eat healthy. She has had no weight loss.   She is due for mammogram, reports that she has seen her eye doctor but has not gone to the dentist. She is UTD on colonoscopy.   She denies any interval history and continues to follow up with Dr. Toy Care for her mental health issues.     Review of Systems  Constitutional: Negative.   HENT: Negative.   Eyes: Negative.   Respiratory: Negative.   Cardiovascular: Negative.   Gastrointestinal: Positive for abdominal pain. Negative for nausea, vomiting, diarrhea, constipation, abdominal distention and rectal pain.  Endocrine: Negative.   Genitourinary: Negative.   Musculoskeletal: Positive for myalgias, back pain and arthralgias. Negative for joint swelling, neck pain and neck stiffness.  Skin: Negative.   Allergic/Immunologic: Negative.   Neurological: Negative.   Hematological: Negative.   Psychiatric/Behavioral:  Negative.   All other systems reviewed and are negative.  Past Medical History  Diagnosis Date  . Fibromyalgia   . Spinal stenosis   . Panic attack   . Barrett esophagus   . Paraesophageal hiatal hernia     repaired 2009  . Cervical arthritis ([place]) 09/22/2013  . H/O hiatal hernia   . Headache(784.0)     migraines  . Depression   . GERD (gastroesophageal reflux disease)   . High cholesterol   . Migraines   . Thyroid disease   . Fibromyalgia   . Lymphocytic colitis   . Breast nodule     benign  . Elevated liver enzymes     Social History   Social History  . Marital Status: Married    Spouse Name: N/A  . Number of Children: N/A  . Years of Education: N/A   Occupational History  . Not on file.   Social History Main Topics  . Smoking status: Never Smoker   . Smokeless tobacco: Never Used  . Alcohol Use: 0.0 oz/week    0 Standard drinks or equivalent per week     Comment: wine occassionally  . Drug Use: No  . Sexual Activity: Yes    Birth Control/ Protection: None     Comment: n/a partial hysterectomy   Other Topics Concern  . Not on file   Social History Narrative   Married    Lived in [place].  Moved to [place] in early 2000s   She is on disability for anxiety     Past Surgical History  Procedure Laterality Date  . Tubal ligation  1990s?  Marland Kitchen Cholecystectomy  1980s?  . Vaginal hysterectomy  03/04/2001  . Ankle surgery    .  Laparoscopic paraesophageal hernia repair  03/05/2008    Dr Johney Maine  . Laparoscopic nissen fundoplication  0000000  . Eus N/A 11/19/2013    Procedure: ESOPHAGEAL ENDOSCOPIC ULTRASOUND (EUS) RADIAL;  Surgeon: Arta Silence, MD;  Location: [facility];  Service: Endoscopy;  Laterality: N/A;    Family History  Problem Relation Age of Onset  . Alcohol abuse    . Colon cancer    . Elevated Lipids    . Heart disease      Allergies  Allergen Reactions  . Codeine Nausea And Vomiting  . Compazine [Prochlorperazine Edisylate]      "feels weird"  . Sulfa Antibiotics     Unknown reaction.    Current Outpatient Prescriptions on File Prior to Visit  Medication Sig Dispense Refill  . ALPRAZolam (XANAX) 1 MG tablet Take by mouth. Take 1 to 2 tablets by mouth three times a day as needed.    . gabapentin (NEURONTIN) 600 MG tablet Take 1 tablet (600 mg total) by mouth 3 (three) times daily. (Patient taking differently: Take 600 mg by mouth 6 (six) times daily. Dr. Toy Care) 90 tablet 0  . levothyroxine (SYNTHROID, LEVOTHROID) 25 MCG tablet Take 1 tablet (25 mcg total) by mouth daily before breakfast. 90 tablet 1  . omega-3 acid ethyl esters (LOVAZA) 1 G capsule Take 1 g by mouth 2 (two) times daily.    . QUEtiapine (SEROQUEL) 400 MG tablet Take 800 mg by mouth at bedtime.    . traMADol (ULTRAM) 50 MG tablet Take 2 tablets (100 mg total) by mouth every 6 (six) hours as needed for pain. (Patient taking differently: Take 100 mg by mouth every 6 (six) hours as needed for moderate pain. Dr. Toy Care) 30 tablet 0  . venlafaxine XR (EFFEXOR-XR) 75 MG 24 hr capsule 3 (three) times daily. Take 3 capsules by mouth every morning.     No current facility-administered medications on file prior to visit.    BP 120/78 mmHg  Temp(Src) 98.6 F (37 C) (Oral)  Ht 5\' 4"  (1.626 m)  Wt 186 lb 11.2 oz (84.687 kg)  BMI 32.03 kg/m2       Objective:   Physical Exam  Constitutional: She is oriented to person, place, and time. She appears well-developed and well-nourished. No distress.  HENT:  Right Ear: Hearing, tympanic membrane, external ear and ear canal normal.  Left Ear: Hearing, tympanic membrane, external ear and ear canal normal.  Neck: Carotid bruit is not present.  Cardiovascular: Normal rate, regular rhythm, normal heart sounds and intact distal pulses.  Exam reveals no gallop and no friction rub.   No murmur heard. Pulmonary/Chest: Effort normal and breath sounds normal. No respiratory distress. She has no wheezes. She has no rales. She  exhibits no tenderness.  Abdominal: Soft. Bowel sounds are normal. She exhibits no distension, no ascites and no mass. There is no splenomegaly or hepatomegaly. There is tenderness in the right upper quadrant, right lower quadrant, epigastric area and periumbilical area. There is no rigidity, no rebound, no guarding, no CVA tenderness, no tenderness at McBurney's point and negative Murphy's sign. No hernia.  Musculoskeletal: Normal range of motion. She exhibits no edema or tenderness.  Neurological: She is alert and oriented to person, place, and time. She has normal reflexes. She displays normal reflexes. No cranial nerve deficit. She exhibits normal muscle tone. Coordination normal.  Skin: Skin is warm and dry. No rash noted. She is not diaphoretic. No erythema. No pallor.  Psychiatric: She has a  normal mood and affect. Her behavior is normal. Judgment and thought content normal.  Nursing note and vitals reviewed.      Assessment & Plan:  1. Routine general medical examination at a health care facility - Reviewed labs in detail.  - Paperwork given for living will and healthcare power of attorney.  - she needs to start exercising more and eating healthy foods. - Follow-up in 1 year for next physical exam - Low up sooner if needed 2. Abdominal pain, right upper quadrant - This has been a chronic issue for the patient. She had a CT of abdomen done in 2015 which showed small liver lesions are most likely cysts - US Abdomen Limited RUQ; Future - Consider referral to GI 3. Post-menopausal - MM DIGITAL SCREENING BILATERAL; Future  4. Decreased GFR - Basic metabolic panel - Consider referral to nephrology  [name], NP

## 2015-08-13 NOTE — Patient Instructions (Signed)
It was great seeing you again!  Call the breast center and schedule your mammogram   I will follow up with you regarding your blood work   Continue to exercise and diet.   Someone will call you to schedule your ultrasound.

## 2015-08-13 NOTE — Addendum Note (Signed)
Addended by: Apolinar Junes on: 08/13/2015 05:53 PM   Modules accepted: Level of Service

## 2015-08-17 ENCOUNTER — Other Ambulatory Visit: Payer: Self-pay | Admitting: Adult Health

## 2015-08-17 DIAGNOSIS — Z1231 Encounter for screening mammogram for malignant neoplasm of breast: Secondary | ICD-10-CM

## 2015-08-19 ENCOUNTER — Telehealth: Payer: Self-pay | Admitting: Adult Health

## 2015-08-19 DIAGNOSIS — R944 Abnormal results of kidney function studies: Secondary | ICD-10-CM

## 2015-08-19 NOTE — Telephone Encounter (Signed)
Spoke to patient about her labs. Her GFR has been decreasing. She has not seen anyone in the past about her kidneys. Her latest UA showed +1 protein. I am going to send her to nephrology for further evaluation. She also has a [name] of abdomen coming up on 08/23/2015.

## 2015-08-22 ENCOUNTER — Encounter (HOSPITAL_COMMUNITY): Payer: Self-pay | Admitting: *Deleted

## 2015-08-22 ENCOUNTER — Emergency Department (HOSPITAL_COMMUNITY)
Admission: EM | Admit: 2015-08-22 | Discharge: 2015-08-22 | Disposition: A | Discharge status: Home / Self Care | Payer: Commercial Managed Care - HMO | Attending: Emergency Medicine | Admitting: Emergency Medicine

## 2015-08-22 DIAGNOSIS — K219 Gastro-esophageal reflux disease without esophagitis: Secondary | ICD-10-CM | POA: Insufficient documentation

## 2015-08-22 DIAGNOSIS — K529 Noninfective gastroenteritis and colitis, unspecified: Secondary | ICD-10-CM | POA: Insufficient documentation

## 2015-08-22 DIAGNOSIS — E0590 Thyrotoxicosis, unspecified without thyrotoxic crisis or storm: Secondary | ICD-10-CM | POA: Insufficient documentation

## 2015-08-22 DIAGNOSIS — Z79899 Other long term (current) drug therapy: Secondary | ICD-10-CM | POA: Insufficient documentation

## 2015-08-22 DIAGNOSIS — E7800 Pure hypercholesterolemia, unspecified: Secondary | ICD-10-CM | POA: Insufficient documentation

## 2015-08-22 DIAGNOSIS — F329 Major depressive disorder, single episode, unspecified: Secondary | ICD-10-CM | POA: Diagnosis not present

## 2015-08-22 DIAGNOSIS — Z79891 Long term (current) use of opiate analgesic: Secondary | ICD-10-CM | POA: Diagnosis not present

## 2015-08-22 DIAGNOSIS — E860 Dehydration: Secondary | ICD-10-CM | POA: Diagnosis not present

## 2015-08-22 DIAGNOSIS — R197 Diarrhea, unspecified: Secondary | ICD-10-CM | POA: Diagnosis not present

## 2015-08-22 DIAGNOSIS — R112 Nausea with vomiting, unspecified: Secondary | ICD-10-CM | POA: Diagnosis not present

## 2015-08-22 DIAGNOSIS — R1110 Vomiting, unspecified: Secondary | ICD-10-CM | POA: Diagnosis present

## 2015-08-22 DIAGNOSIS — M47812 Spondylosis without myelopathy or radiculopathy, cervical region: Secondary | ICD-10-CM | POA: Diagnosis not present

## 2015-08-22 LAB — COMPREHENSIVE METABOLIC PANEL
ALT: 55 U/L — ABNORMAL HIGH (ref 14–54)
ANION GAP: 13 (ref 5–15)
AST: 39 U/L (ref 15–41)
Albumin: 4.8 g/dL (ref 3.5–5.0)
Alkaline Phosphatase: 142 U/L — ABNORMAL HIGH (ref 38–126)
BUN: 18 mg/dL (ref 6–20)
CHLORIDE: 101 mmol/L (ref 101–111)
CO2: 24 mmol/L (ref 22–32)
Calcium: 9.8 mg/dL (ref 8.9–10.3)
Creatinine, Ser: 1.06 mg/dL — ABNORMAL HIGH (ref 0.44–1.00)
GFR, EST NON AFRICAN AMERICAN: 58 mL/min — AB (ref >60)
Glucose, Bld: 157 mg/dL — ABNORMAL HIGH (ref 65–99)
Potassium: 3.9 mmol/L (ref 3.5–5.1)
Sodium: 138 mmol/L (ref 135–145)
Total Bilirubin: 0.6 mg/dL (ref 0.3–1.2)
Total Protein: 8.2 g/dL — ABNORMAL HIGH (ref 6.5–8.1)

## 2015-08-22 LAB — CBC
HEMATOCRIT: 46 % (ref 36.0–46.0)
HEMOGLOBIN: 15.3 g/dL — AB (ref 12.0–15.0)
MCH: 31.2 pg (ref 26.0–34.0)
MCHC: 33.3 g/dL (ref 30.0–36.0)
MCV: 93.9 fL (ref 78.0–100.0)
Platelets: 218 10*3/uL (ref 150–400)
RBC: 4.9 MIL/uL (ref 3.87–5.11)
RDW: 13.1 % (ref 11.5–15.5)
WBC: 14.5 10*3/uL — ABNORMAL HIGH (ref 4.0–10.5)

## 2015-08-22 LAB — LIPASE, BLOOD: Lipase: 25 U/L (ref 11–51)

## 2015-08-22 MED ORDER — LOPERAMIDE HCL 1 MG/5ML PO LIQD: 1.0000 mg | Freq: Three times a day (TID) | ORAL | Status: DC | PRN

## 2015-08-22 MED ORDER — ONDANSETRON HCL 4 MG/2ML IJ SOLN
4.0000 mg | Freq: Once | INTRAMUSCULAR | Status: AC | PRN
  Administered 2015-08-22: 4 mg via INTRAVENOUS
  Filled 2015-08-22: qty 2

## 2015-08-22 MED ORDER — PANTOPRAZOLE SODIUM 20 MG PO TBEC: 20.0000 mg | DELAYED_RELEASE_TABLET | Freq: Every day | ORAL | Status: DC

## 2015-08-22 MED ORDER — SODIUM CHLORIDE 0.9 % IV SOLN
Freq: Once | INTRAVENOUS | Status: AC
  Administered 2015-08-22: 14:00:00 via INTRAVENOUS

## 2015-08-22 MED ORDER — SODIUM CHLORIDE 0.9 % IV SOLN
1000.0000 mL | Freq: Once | INTRAVENOUS | Status: AC
  Administered 2015-08-22: 1000 mL via INTRAVENOUS

## 2015-08-22 MED ORDER — ONDANSETRON 4 MG PO TBDP: 4.0000 mg | ORAL_TABLET | ORAL | Status: DC | PRN

## 2015-08-22 MED ORDER — PANTOPRAZOLE SODIUM 40 MG IV SOLR
40.0000 mg | Freq: Once | INTRAVENOUS | Status: AC
  Administered 2015-08-22: 40 mg via INTRAVENOUS
  Filled 2015-08-22: qty 40

## 2015-08-22 NOTE — ED Provider Notes (Signed)
CSN: [id number]     Arrival date & time 08/22/15  1329 History   First MD Initiated Contact with Patient 08/22/15 1405     Chief Complaint  Patient presents with  . Emesis     (Consider location/radiation/quality/duration/timing/severity/associated sxs/prior Treatment) HPI Patient states she started having recurrent episodes of vomiting and diarrhea at about 4:30 in the morning. She estimates about 10 of each. She did not see any blood. She reports some generalized cramping in her abdomen. She reports since vomiting so much, she has burning in her esophagus as well. The patient states her daughter was sick with similar illness. She states she got up from her. She has been feeling very weak since all of these episodes of vomiting and diarrhea. She reports that she has fibromyalgia so oftentimes that she gets sick she just hurts all over. Past Medical History  Diagnosis Date  . Fibromyalgia   . Spinal stenosis   . Panic attack   . Barrett esophagus   . Paraesophageal hiatal hernia     repaired 2009  . Cervical arthritis ([place]) 09/22/2013  . H/O hiatal hernia   . Headache(784.0)     migraines  . Depression   . GERD (gastroesophageal reflux disease)   . High cholesterol   . Migraines   . Thyroid disease   . Fibromyalgia   . Lymphocytic colitis   . Breast nodule     benign  . Elevated liver enzymes    Past Surgical History  Procedure Laterality Date  . Tubal ligation  1990s?  Marland Kitchen Cholecystectomy  1980s?  . Vaginal hysterectomy  03/04/2001  . Ankle surgery    . Laparoscopic paraesophageal hernia repair  03/05/2008    Dr Johney Maine  . Laparoscopic nissen fundoplication  [phone]  . Eus N/A 11/19/2013    Procedure: ESOPHAGEAL ENDOSCOPIC ULTRASOUND (EUS) RADIAL;  Surgeon: Arta Silence, MD;  Location: [facility];  Service: Endoscopy;  Laterality: N/A;   Family History  Problem Relation Age of Onset  . Alcohol abuse    . Colon cancer    . Elevated Lipids    . Heart disease      Social History  Substance Use Topics  . Smoking status: Never Smoker   . Smokeless tobacco: Never Used  . Alcohol Use: 0.0 oz/week    0 Standard drinks or equivalent per week     Comment: wine occassionally   OB History    No data available     Review of Systems 10 Systems reviewed and are negative for acute change except as noted in the HPI.    Allergies  Codeine; Compazine; and Sulfa antibiotics  Home Medications   Prior to Admission medications   Medication Sig Start Date End Date Taking? Authorizing Provider  ALPRAZolam Duanne Moron) 1 MG tablet Take 2 mg by mouth 3 (three) times daily as needed for anxiety (anxiety). . 06/20/12  Yes Chucky May, MD  gabapentin (NEURONTIN) 600 MG tablet Take 1 tablet (600 mg total) by mouth 3 (three) times daily. Patient taking differently: Take 1,200 mg by mouth 3 (three) times daily. Dr. Toy Care 06/20/12  Yes Virgel Manifold, MD  levothyroxine (SYNTHROID, LEVOTHROID) 25 MCG tablet Take 1 tablet (25 mcg total) by mouth daily before breakfast. 04/23/15  Yes [name], NP  omega-3 acid ethyl esters (LOVAZA) 1 G capsule Take 1 g by mouth 2 (two) times daily.   Yes Historical Provider, MD  QUEtiapine (SEROQUEL) 400 MG tablet Take 800 mg by mouth at bedtime.  Yes Historical Provider, MD  traMADol (ULTRAM) 50 MG tablet Take 2 tablets (100 mg total) by mouth every 6 (six) hours as needed for pain. Patient taking differently: Take 100 mg by mouth every 6 (six) hours as needed for moderate pain. Dr. Toy Care 06/20/12  Yes Virgel Manifold, MD  venlafaxine XR (EFFEXOR-XR) 75 MG 24 hr capsule Take 75 mg by mouth 3 (three) times daily.    Yes Chucky May, MD  loperamide (IMODIUM) 1 MG/5ML solution Take 5 mLs (1 mg total) by mouth 3 (three) times daily as needed for diarrhea or loose stools. 08/22/15   Charlesetta Shanks, MD  ondansetron (ZOFRAN ODT) 4 MG disintegrating tablet Take 1 tablet (4 mg total) by mouth every 4 (four) hours as needed for nausea or vomiting.  08/22/15   Charlesetta Shanks, MD  pantoprazole (PROTONIX) 20 MG tablet Take 1 tablet (20 mg total) by mouth daily. 08/22/15   Charlesetta Shanks, MD   BP 117/86 mmHg  Pulse 95  Temp(Src) 97.4 F (36.3 C) (Oral)  Resp 20  SpO2 100% Physical Exam  Constitutional: She is oriented to person, place, and time. She appears well-developed and well-nourished.  Patient appears moderately ill and uncomfortable. She is nontoxic alert. No respiratory distress.  HENT:  Head: Normocephalic and atraumatic.  Mouth/Throat: Oropharynx is clear and moist.  Eyes: EOM are normal. Pupils are equal, round, and reactive to light.  Neck: Neck supple.  Cardiovascular: Regular rhythm, normal heart sounds and intact distal pulses.   Tachycardia.  Pulmonary/Chest: Effort normal and breath sounds normal.  Abdominal: Soft. Bowel sounds are normal. She exhibits no distension. There is no tenderness.  Musculoskeletal: Normal range of motion. She exhibits no edema.  Neurological: She is alert and oriented to person, place, and time. She has normal strength. Coordination normal. GCS eye subscore is 4. GCS verbal subscore is 5. GCS motor subscore is 6.  Skin: Skin is warm, dry and intact.  Psychiatric: She has a normal mood and affect.    ED Course  Procedures (including critical care time) Labs Review Labs Reviewed  COMPREHENSIVE METABOLIC PANEL - Abnormal; Notable for the following:    Glucose, Bld 157 (*)    Creatinine, Ser 1.06 (*)    Total Protein 8.2 (*)    ALT 55 (*)    Alkaline Phosphatase 142 (*)    GFR calc non Af Amer 58 (*)    All other components within normal limits  CBC - Abnormal; Notable for the following:    WBC 14.5 (*)    Hemoglobin 15.3 (*)    All other components within normal limits  LIPASE, BLOOD  URINALYSIS, ROUTINE W REFLEX MICROSCOPIC (NOT AT [facility])    Imaging Review No results found. I have personally reviewed and evaluated these images and lab results as part of my medical  decision-making.   EKG Interpretation None      MDM   Final diagnoses:  Gastroenteritis   History is most consistent with gastroenteritis. Patient has had sick contact. She is nontoxic and alert. She has nonsurgical abdominal examination. The patient has been rehydrated with 2 L of fluid. She is provided with Zofran and Protonix for symptomatically treatment. Patient is given signs and symptoms for which to return. She has not had further vomiting or diarrhea in the emergency department.   Charlesetta Shanks, MD 08/22/15 1556

## 2015-08-22 NOTE — ED Notes (Signed)
Bed: WA06 Expected date:  Expected time:  Means of arrival:  Comments: EMS 

## 2015-08-22 NOTE — ED Notes (Signed)
Per EMS, pt from home, reports n/v/d since 0430 this am.  States her daughter was just sick with same recently.  Pt also reports generalized pain, has hx of fibromyalgia.  Pt also reports pain in her throat.

## 2015-08-22 NOTE — Discharge Instructions (Signed)

## 2015-08-23 ENCOUNTER — Other Ambulatory Visit: Payer: Commercial Managed Care - HMO

## 2015-08-27 ENCOUNTER — Ambulatory Visit (INDEPENDENT_AMBULATORY_CARE_PROVIDER_SITE_OTHER): Payer: Commercial Managed Care - HMO | Admitting: Adult Health

## 2015-08-27 ENCOUNTER — Encounter: Payer: Self-pay | Admitting: Adult Health

## 2015-08-27 ENCOUNTER — Ambulatory Visit: Payer: Commercial Managed Care - HMO

## 2015-08-27 VITALS — BP 100/52 | Temp 98.4°F | Wt 183.1 lb

## 2015-08-27 DIAGNOSIS — K529 Noninfective gastroenteritis and colitis, unspecified: Secondary | ICD-10-CM

## 2015-08-27 DIAGNOSIS — R7309 Other abnormal glucose: Secondary | ICD-10-CM | POA: Diagnosis not present

## 2015-08-27 LAB — CBC WITH DIFFERENTIAL/PLATELET
Basophils Absolute: 0 10*3/uL (ref 0.0–0.1)
Basophils Relative: 0.3 % (ref 0.0–3.0)
EOS ABS: 0.1 10*3/uL (ref 0.0–0.7)
EOS PCT: 0.9 % (ref 0.0–5.0)
HEMATOCRIT: 38.8 % (ref 36.0–46.0)
Hemoglobin: 13.1 g/dL (ref 12.0–15.0)
Lymphocytes Relative: 23.8 % (ref 12.0–46.0)
Lymphs Abs: 2.3 10*3/uL (ref 0.7–4.0)
MCHC: 33.8 g/dL (ref 30.0–36.0)
MCV: 91.3 fl (ref 78.0–100.0)
MONO ABS: 0.7 10*3/uL (ref 0.1–1.0)
Monocytes Relative: 7.4 % (ref 3.0–12.0)
Neutro Abs: 6.6 10*3/uL (ref 1.4–7.7)
Neutrophils Relative %: 67.6 % (ref 43.0–77.0)
Platelets: 220 10*3/uL (ref 150.0–400.0)
RBC: 4.25 Mil/uL (ref 3.87–5.11)
RDW: 13.4 % (ref 11.5–15.5)
WBC: 9.7 10*3/uL (ref 4.0–10.5)

## 2015-08-27 LAB — HEMOGLOBIN A1C: Hgb A1c MFr Bld: 5.5 % (ref 4.6–6.5)

## 2015-08-27 LAB — BASIC METABOLIC PANEL
BUN: 13 mg/dL (ref 6–23)
CO2: 26 mEq/L (ref 19–32)
Calcium: 9.3 mg/dL (ref 8.4–10.5)
Chloride: 102 mEq/L (ref 96–112)
Creatinine, Ser: 1.02 mg/dL (ref 0.40–1.20)
GFR: 59.46 mL/min — ABNORMAL LOW (ref >60.00)
GLUCOSE: 122 mg/dL — AB (ref 70–99)
POTASSIUM: 3.7 meq/L (ref 3.5–5.1)
SODIUM: 138 meq/L (ref 135–145)

## 2015-08-27 MED ORDER — PROMETHAZINE HCL 12.5 MG PO TABS: 12.5000 mg | ORAL_TABLET | Freq: Three times a day (TID) | ORAL | Status: DC | PRN

## 2015-08-27 NOTE — Progress Notes (Signed)
Subjective:    Patient ID: Leslie Benson, female    DOB: 03/05/1961, 55 y.o.   MRN: [id number]  HPI  55 year old female who presents to the office today for follow up regarding diarrhea. She was seen in the ER on 08/22/2015 for vomiting and diarrhea. She was given IV fluids and discharged home with protonix and zofran. She was diagnosed with gastroenteritis.   Today she reports that today was the first day she did not have diarrhea. She continues to feel weak and wornout.   The last time she vomited was 4 days ago. She is drinking fluids but not eating much.   She complains of pain in her abdomen   Review of Systems  Constitutional: Positive for fatigue. Negative for chills, diaphoresis, activity change and appetite change.  Respiratory: Negative.   Gastrointestinal: Positive for nausea, vomiting (resolved ), abdominal pain and diarrhea. Negative for blood in stool, abdominal distention and anal bleeding.  Psychiatric/Behavioral: Negative.   All other systems reviewed and are negative.  Past Medical History  Diagnosis Date  . Fibromyalgia   . Spinal stenosis   . Panic attack   . Barrett esophagus   . Paraesophageal hiatal hernia     repaired 2009  . Cervical arthritis ([place]) 09/22/2013  . H/O hiatal hernia   . Headache(784.0)     migraines  . Depression   . GERD (gastroesophageal reflux disease)   . High cholesterol   . Migraines   . Thyroid disease   . Fibromyalgia   . Lymphocytic colitis   . Breast nodule     benign  . Elevated liver enzymes     Social History   Social History  . Marital Status: Married    Spouse Name: N/A  . Number of Children: N/A  . Years of Education: N/A   Occupational History  . Not on file.   Social History Main Topics  . Smoking status: Never Smoker   . Smokeless tobacco: Never Used  . Alcohol Use: 0.0 oz/week    0 Standard drinks or equivalent per week     Comment: wine occassionally  . Drug Use: No  . Sexual Activity: Yes     Birth Control/ Protection: None     Comment: n/a partial hysterectomy   Other Topics Concern  . Not on file   Social History Narrative   Married    Lived in [place].  Moved to [place] in early 2000s   She is on disability for anxiety     Past Surgical History  Procedure Laterality Date  . Tubal ligation  1990s?  Marland Kitchen Cholecystectomy  1980s?  . Vaginal hysterectomy  03/04/2001  . Ankle surgery    . Laparoscopic paraesophageal hernia repair  03/05/2008    Dr Johney Maine  . Laparoscopic nissen fundoplication  0000000  . Eus N/A 11/19/2013    Procedure: ESOPHAGEAL ENDOSCOPIC ULTRASOUND (EUS) RADIAL;  Surgeon: Arta Silence, MD;  Location: [facility];  Service: Endoscopy;  Laterality: N/A;    Family History  Problem Relation Age of Onset  . Alcohol abuse    . Colon cancer    . Elevated Lipids    . Heart disease      Allergies  Allergen Reactions  . Codeine Nausea And Vomiting  . Compazine [Prochlorperazine Edisylate]     "feels weird"  . Sulfa Antibiotics     Unknown reaction.    Current Outpatient Prescriptions on File Prior to Visit  Medication Sig Dispense Refill  .  ALPRAZolam (XANAX) 1 MG tablet Take 2 mg by mouth 3 (three) times daily as needed for anxiety (anxiety). .    . gabapentin (NEURONTIN) 600 MG tablet Take 1 tablet (600 mg total) by mouth 3 (three) times daily. (Patient taking differently: Take 1,200 mg by mouth 3 (three) times daily. Dr. Toy Care) 90 tablet 0  . levothyroxine (SYNTHROID, LEVOTHROID) 25 MCG tablet Take 1 tablet (25 mcg total) by mouth daily before breakfast. 90 tablet 1  . loperamide (IMODIUM) 1 MG/5ML solution Take 5 mLs (1 mg total) by mouth 3 (three) times daily as needed for diarrhea or loose stools. 30 mL 0  . omega-3 acid ethyl esters (LOVAZA) 1 G capsule Take 1 g by mouth 2 (two) times daily.    . ondansetron (ZOFRAN ODT) 4 MG disintegrating tablet Take 1 tablet (4 mg total) by mouth every 4 (four) hours as needed for nausea or vomiting.  20 tablet 0  . pantoprazole (PROTONIX) 20 MG tablet Take 1 tablet (20 mg total) by mouth daily. 30 tablet 0  . QUEtiapine (SEROQUEL) 400 MG tablet Take 800 mg by mouth at bedtime.    . traMADol (ULTRAM) 50 MG tablet Take 2 tablets (100 mg total) by mouth every 6 (six) hours as needed for pain. (Patient taking differently: Take 100 mg by mouth every 6 (six) hours as needed for moderate pain. Dr. Toy Care) 30 tablet 0  . venlafaxine XR (EFFEXOR-XR) 75 MG 24 hr capsule Take 75 mg by mouth 3 (three) times daily.      No current facility-administered medications on file prior to visit.    BP 100/52 mmHg  Temp(Src) 98.4 F (36.9 C) (Oral)  Wt 183 lb 1.6 oz (83.054 kg)       Objective:   Physical Exam  Constitutional: She is oriented to person, place, and time. She appears well-developed and well-nourished. No distress.  Cardiovascular: Normal rate, regular rhythm, normal heart sounds and intact distal pulses.  Exam reveals no gallop and no friction rub.   No murmur heard. Pulmonary/Chest: Effort normal and breath sounds normal. No respiratory distress. She has no wheezes. She has no rales. She exhibits no tenderness.  Abdominal: Soft. Bowel sounds are normal. She exhibits no distension and no mass. There is tenderness. There is no rebound.  Neurological: She is alert and oriented to person, place, and time.  Skin: Skin is warm and dry. No rash noted. She is not diaphoretic. No erythema. No pallor.  Psychiatric: She has a normal mood and affect. Her behavior is normal. Judgment and thought content normal.  Vitals reviewed.     Assessment & Plan:  1. Gastroenteritis - CBC with Differential/Platelet - Basic metabolic panel - Stool culture - C. difficile, PCR - Continue to stay hydrated and eat blank foods.  - Gatorade as tolerated - Follow up if no improvement in the next 2-3 days - Phenergan 12.5mg  called to pharmacy  2. Elevated glucose  - Hemoglobin A1c  [name], NP

## 2015-08-27 NOTE — Patient Instructions (Signed)
I am sorry you are feeling this way.   I have sent in a prescription for phenergyn  Continue to keep hydrated, eat things such as soup, bananas, toast.   If you continue to have diarrhea, please bring back a sample.   I will follow up with you regarding your blood work

## 2015-09-01 ENCOUNTER — Ambulatory Visit
Admission: RE | Admit: 2015-09-01 | Discharge: 2015-09-01 | Disposition: A | Discharge status: Home / Self Care | Payer: Commercial Managed Care - HMO | Source: Ambulatory Visit | Attending: Adult Health | Admitting: Adult Health

## 2015-09-01 DIAGNOSIS — R944 Abnormal results of kidney function studies: Secondary | ICD-10-CM

## 2015-09-01 DIAGNOSIS — R1011 Right upper quadrant pain: Secondary | ICD-10-CM | POA: Diagnosis not present

## 2015-09-02 ENCOUNTER — Telehealth: Payer: Self-pay | Admitting: Adult Health

## 2015-09-02 NOTE — Telephone Encounter (Signed)
Pt is returning cory call °

## 2015-09-02 NOTE — Telephone Encounter (Signed)
Called pt's cell phone. No answer, left message to call back

## 2015-09-02 NOTE — Telephone Encounter (Signed)
See below

## 2015-09-02 NOTE — Telephone Encounter (Signed)
Spoke to [employer] and informed her of her [name] results. She states that her stomach is starting to feel better. Does not want to see GI at this time

## 2015-09-05 LAB — STOOL CULTURE

## 2015-09-07 DIAGNOSIS — N182 Chronic kidney disease, stage 2 (mild): Secondary | ICD-10-CM | POA: Diagnosis not present

## 2015-09-07 DIAGNOSIS — N179 Acute kidney failure, unspecified: Secondary | ICD-10-CM | POA: Diagnosis not present

## 2015-09-08 ENCOUNTER — Ambulatory Visit
Admission: RE | Admit: 2015-09-08 | Discharge: 2015-09-08 | Disposition: A | Discharge status: Home / Self Care | Payer: Commercial Managed Care - HMO | Source: Ambulatory Visit | Attending: Adult Health | Admitting: Adult Health

## 2015-09-08 DIAGNOSIS — Z1231 Encounter for screening mammogram for malignant neoplasm of breast: Secondary | ICD-10-CM

## 2015-09-22 ENCOUNTER — Encounter (HOSPITAL_COMMUNITY): Payer: Self-pay

## 2015-09-22 ENCOUNTER — Emergency Department (HOSPITAL_COMMUNITY)
Admission: EM | Admit: 2015-09-22 | Discharge: 2015-09-23 | Disposition: A | Discharge status: Home / Self Care | Payer: Commercial Managed Care - HMO | Attending: Emergency Medicine | Admitting: Emergency Medicine

## 2015-09-22 DIAGNOSIS — Z79899 Other long term (current) drug therapy: Secondary | ICD-10-CM | POA: Insufficient documentation

## 2015-09-22 DIAGNOSIS — M1990 Unspecified osteoarthritis, unspecified site: Secondary | ICD-10-CM | POA: Insufficient documentation

## 2015-09-22 DIAGNOSIS — R1084 Generalized abdominal pain: Secondary | ICD-10-CM

## 2015-09-22 DIAGNOSIS — F329 Major depressive disorder, single episode, unspecified: Secondary | ICD-10-CM | POA: Diagnosis not present

## 2015-09-22 DIAGNOSIS — N281 Cyst of kidney, acquired: Secondary | ICD-10-CM | POA: Diagnosis not present

## 2015-09-22 LAB — CBC WITH DIFFERENTIAL/PLATELET
BASOS PCT: 0 %
Basophils Absolute: 0 10*3/uL (ref 0.0–0.1)
EOS ABS: 0.2 10*3/uL (ref 0.0–0.7)
Eosinophils Relative: 2 %
HCT: 41.7 % (ref 36.0–46.0)
HEMOGLOBIN: 14.3 g/dL (ref 12.0–15.0)
Lymphocytes Relative: 50 %
Lymphs Abs: 3.9 10*3/uL (ref 0.7–4.0)
MCH: 31.3 pg (ref 26.0–34.0)
MCHC: 34.3 g/dL (ref 30.0–36.0)
MCV: 91.2 fL (ref 78.0–100.0)
Monocytes Absolute: 1 10*3/uL (ref 0.1–1.0)
Monocytes Relative: 12 %
NEUTROS PCT: 36 %
Neutro Abs: 2.9 10*3/uL (ref 1.7–7.7)
PLATELETS: 175 10*3/uL (ref 150–400)
RBC: 4.57 MIL/uL (ref 3.87–5.11)
RDW: 13.3 % (ref 11.5–15.5)
WBC: 8 10*3/uL (ref 4.0–10.5)

## 2015-09-22 LAB — COMPREHENSIVE METABOLIC PANEL
ALBUMIN: 4.6 g/dL (ref 3.5–5.0)
ALK PHOS: 135 U/L — AB (ref 38–126)
ALT: 67 U/L — ABNORMAL HIGH (ref 14–54)
ANION GAP: 9 (ref 5–15)
AST: 37 U/L (ref 15–41)
BUN: 17 mg/dL (ref 6–20)
CALCIUM: 9.5 mg/dL (ref 8.9–10.3)
CHLORIDE: 107 mmol/L (ref 101–111)
CO2: 24 mmol/L (ref 22–32)
Creatinine, Ser: 1 mg/dL (ref 0.44–1.00)
GFR calc Af Amer: >60 mL/min (ref >60)
GFR calc non Af Amer: >60 mL/min (ref >60)
GLUCOSE: 103 mg/dL — AB (ref 65–99)
Potassium: 4 mmol/L (ref 3.5–5.1)
SODIUM: 140 mmol/L (ref 135–145)
Total Bilirubin: 0.2 mg/dL — ABNORMAL LOW (ref 0.3–1.2)
Total Protein: 7.4 g/dL (ref 6.5–8.1)

## 2015-09-22 LAB — LIPASE, BLOOD: Lipase: 23 U/L (ref 11–51)

## 2015-09-22 MED ORDER — KETOROLAC TROMETHAMINE 30 MG/ML IJ SOLN
30.0000 mg | Freq: Once | INTRAMUSCULAR | Status: AC
  Administered 2015-09-23: 30 mg via INTRAVENOUS
  Filled 2015-09-22: qty 1

## 2015-09-22 MED ORDER — ONDANSETRON HCL 4 MG/2ML IJ SOLN: 4.0000 mg | Freq: Once | INTRAMUSCULAR | Status: DC

## 2015-09-22 MED ORDER — METOCLOPRAMIDE HCL 5 MG/ML IJ SOLN
10.0000 mg | INTRAMUSCULAR | Status: AC
  Administered 2015-09-23: 10 mg via INTRAVENOUS
  Filled 2015-09-22: qty 2

## 2015-09-22 NOTE — ED Notes (Signed)
Pt complains of severe abdominal pain that started about 630 tonight, she states that the pain is getting worse and she's constipated

## 2015-09-22 NOTE — ED Notes (Signed)
Pt. Unable to void at this time, pt. in excruating pain.

## 2015-09-22 NOTE — ED Provider Notes (Signed)
CSN: [id number]     Arrival date & time 09/22/15  2218 History  By signing my name below, I, Leslie Benson, attest that this documentation has been prepared under the direction and in the presence of [employer], PA-C.   Electronically Signed: Tobe Benson, ED Scribe. 09/22/2015. 11:56 PM.  Chief Complaint  Patient presents with  . Abdominal Pain   The history is provided by the patient. No language interpreter was used.   HPI Comments: Leslie Benson is a 55 y.o. female with a PMHx of fibromyalgia, h/o hiatal hernia, GERD, and HLD who presents to the Emergency Department complaining of gradual onset, gradually worsening diffuse abdominal pain onset approximately 7 hours ago. Pt reports associated nausea. Pt reports that her pain first began in her RLQ but has since spread to her entire abdominal region. Pt reports that she has had 2 small bowel movement today with trouble. Pt denies a bowel movement yesterday. Pt reports that she usually has a bowel movement every two days. Pt took Tramadol with no relief. No other OTC medications or home remedies tried PTA. Pt denies a PMHx of a bowel obstruction. Pt denies dysuria, fever, vomiting, CP, or SOB.    Past Medical History  Diagnosis Date  . Fibromyalgia   . Spinal stenosis   . Panic attack   . Barrett esophagus   . Paraesophageal hiatal hernia     repaired 2009  . Cervical arthritis ([place]) 09/22/2013  . H/O hiatal hernia   . Headache(784.0)     migraines  . Depression   . GERD (gastroesophageal reflux disease)   . High cholesterol   . Migraines   . Thyroid disease   . Fibromyalgia   . Lymphocytic colitis   . Breast nodule     benign  . Elevated liver enzymes    Past Surgical History  Procedure Laterality Date  . Tubal ligation  1990s?  Marland Kitchen Cholecystectomy  1980s?  . Vaginal hysterectomy  03/04/2001  . Ankle surgery    . Laparoscopic paraesophageal hernia repair  03/05/2008    Dr Johney Maine  . Laparoscopic nissen fundoplication   0000000  . Eus N/A 11/19/2013    Procedure: ESOPHAGEAL ENDOSCOPIC ULTRASOUND (EUS) RADIAL;  Surgeon: Arta Silence, MD;  Location: [facility];  Service: Endoscopy;  Laterality: N/A;   Family History  Problem Relation Age of Onset  . Alcohol abuse    . Colon cancer    . Elevated Lipids    . Heart disease     Social History  Substance Use Topics  . Smoking status: Never Smoker   . Smokeless tobacco: Never Used  . Alcohol Use: 0.0 oz/week    0 Standard drinks or equivalent per week     Comment: wine occassionally   OB History    No data available      Review of Systems  Constitutional: Negative for fever.  Respiratory: Negative for shortness of breath.   Cardiovascular: Negative for chest pain.  Gastrointestinal: Positive for nausea, abdominal pain and constipation. Negative for vomiting.  Genitourinary: Negative for dysuria.  A complete 10 system review of systems was obtained and all systems are negative except as noted in the HPI and PMH.    Allergies  Codeine; Compazine; and Sulfa antibiotics  Home Medications   Prior to Admission medications   Medication Sig Start Date End Date Taking? Authorizing Provider  ALPRAZolam Duanne Moron) 1 MG tablet Take 2 mg by mouth 3 (three) times daily as needed for anxiety (anxiety). Marland Kitchen  06/20/12  Yes Rupinder Toy Care, MD  bisacodyl (DULCOLAX) 5 MG EC tablet Take 5 mg by mouth daily as needed for mild constipation or moderate constipation.   Yes Historical Provider, MD  gabapentin (NEURONTIN) 600 MG tablet Take 1 tablet (600 mg total) by mouth 3 (three) times daily. Patient taking differently: Take 1,200 mg by mouth 3 (three) times daily. Dr. Toy Care 06/20/12  Yes Virgel Manifold, MD  levothyroxine (SYNTHROID, LEVOTHROID) 25 MCG tablet Take 1 tablet (25 mcg total) by mouth daily before breakfast. 04/23/15  Yes [name], NP  omega-3 acid ethyl esters (LOVAZA) 1 G capsule Take 1 g by mouth 2 (two) times daily.   Yes Historical Provider, MD   QUEtiapine (SEROQUEL) 400 MG tablet Take 800 mg by mouth at bedtime.   Yes Historical Provider, MD  traMADol (ULTRAM) 50 MG tablet Take 2 tablets (100 mg total) by mouth every 6 (six) hours as needed for pain. Patient taking differently: Take 100 mg by mouth every 6 (six) hours as needed for moderate pain. Dr. Toy Care 06/20/12  Yes Virgel Manifold, MD  venlafaxine XR (EFFEXOR-XR) 75 MG 24 hr capsule Take 75 mg by mouth 3 (three) times daily.    Yes Chucky May, MD  loperamide (IMODIUM) 1 MG/5ML solution Take 5 mLs (1 mg total) by mouth 3 (three) times daily as needed for diarrhea or loose stools. 08/22/15   Charlesetta Shanks, MD  ondansetron (ZOFRAN ODT) 4 MG disintegrating tablet Take 1 tablet (4 mg total) by mouth every 4 (four) hours as needed for nausea or vomiting. 08/22/15   Charlesetta Shanks, MD  pantoprazole (PROTONIX) 20 MG tablet Take 1 tablet (20 mg total) by mouth daily. 08/22/15   Charlesetta Shanks, MD  promethazine (PHENERGAN) 12.5 MG tablet Take 1 tablet (12.5 mg total) by mouth every 8 (eight) hours as needed for nausea or vomiting. 08/27/15   [name], NP   BP 108/70 mmHg  Pulse 76  Temp(Src) 98.4 F (36.9 C) (Oral)  Resp 16  Ht 5\' 4"  (1.626 m)  Wt 83.462 kg  BMI 31.57 kg/m2  SpO2 95%   Physical Exam  Constitutional: She is oriented to person, place, and time. She appears well-developed and well-nourished. No distress.  Nontoxic appearing  HENT:  Head: Normocephalic and atraumatic.  Eyes: Conjunctivae and EOM are normal. No scleral icterus.  Neck: Normal range of motion.  Cardiovascular: Regular rhythm and intact distal pulses.   Mild tachycardia  Pulmonary/Chest: Effort normal. No respiratory distress. She has no wheezes. She has no rales.  Respirations even and unlabored  Abdominal: Soft. She exhibits no distension. There is tenderness. There is no rebound and no guarding.  Soft, mildly distended abdomen. Obese. Generalized TTP. No masses or rigidity.  Musculoskeletal: Normal  range of motion.  Neurological: She is alert and oriented to person, place, and time. She exhibits normal muscle tone. Coordination normal.  GCS 15. Patient moving all extremities.  Skin: Skin is warm and dry. No rash noted. She is not diaphoretic. No erythema. No pallor.  Psychiatric: She has a normal mood and affect. Her behavior is normal.  Nursing note and vitals reviewed.   ED Course  Procedures (including critical care time)  DIAGNOSTIC STUDIES: Oxygen Saturation is 100% on RA, normal by my interpretation.   COORDINATION OF CARE: 11:52 PM-Discussed next steps with pt including CT abdomen, antiemetic and pain management medication. Pt verbalized understanding and is agreeable with the plan.   Labs Review Labs Reviewed  COMPREHENSIVE METABOLIC PANEL -  Abnormal; Notable for the following:    Glucose, Bld 103 (*)    ALT 67 (*)    Alkaline Phosphatase 135 (*)    Total Bilirubin 0.2 (*)    All other components within normal limits  CBC WITH DIFFERENTIAL/PLATELET  LIPASE, BLOOD  URINALYSIS, ROUTINE W REFLEX MICROSCOPIC (NOT AT [facility])    Imaging Review Ct Abdomen Pelvis W Contrast  09/23/2015  CLINICAL DATA:  Acute onset of generalized abdominal pain and nausea. Initial encounter. EXAM: CT ABDOMEN AND PELVIS WITH CONTRAST TECHNIQUE: Multidetector CT imaging of the abdomen and pelvis was performed using the standard protocol following bolus administration of intravenous contrast. CONTRAST:  138mL ISOVUE-300 IOPAMIDOL (ISOVUE-300) INJECTION 61% COMPARISON:  CT of the abdomen and pelvis performed 09/22/2013, and right upper quadrant ultrasound performed 09/01/2015 FINDINGS: Trace bilateral pleural fluid is noted, with mild associated atelectasis. Changes of Nissen fundoplication are noted. Fluid within the esophagus likely reflects some degree of esophageal dysmotility. Scattered small hypodensities are seen within the liver, measuring up to 1.5 cm in size. These are nonspecific in  appearance. The spleen is unremarkable in appearance, with adjacent postoperative change. The patient is status post cholecystectomy, with clips noted at the gallbladder fossa. The pancreas and adrenal glands are unremarkable. A 6 mm cyst is noted at the anterior aspect of the left kidney. The kidneys are otherwise unremarkable. There is no evidence of hydronephrosis. No renal or ureteral stones are seen. No perinephric stranding is appreciated. No free fluid is identified. A small diverticulum is suggested at the second segment of the duodenum. The small bowel is otherwise unremarkable in appearance. The stomach is within normal limits. No acute vascular abnormalities are seen. Minimal calcification is seen along the abdominal aorta. The appendix is normal in caliber, without evidence of appendicitis. The cecum is flipped anteriorly. The colon is largely filled with fluid and is grossly unremarkable. The bladder is decompressed and not well assessed. The patient is status post hysterectomy. No suspicious adnexal masses are seen. The ovaries are grossly symmetric. No inguinal lymphadenopathy is seen. No acute osseous abnormalities are identified. IMPRESSION: 1. No acute abnormality seen within the abdomen or pelvis. 2. Trace bilateral pleural fluid, with mild associated atelectasis. 3. Fluid within the esophagus likely reflects some degree of esophageal dysmotility. 4. Scattered small hypodensities within the liver, measuring up to 1.5 cm in size. These are nonspecific. 5. Tiny left renal cyst noted. 6. Small duodenal diverticulum suggested. Electronically Signed   By: Garald Balding M.D.   On: 09/23/2015 01:56     I have personally reviewed and evaluated these images and lab results as part of my medical decision-making.   EKG Interpretation None      MDM   Final diagnoses:  Generalized abdominal pain    55 year old female presents to the emergency department for evaluation of generalized abdominal  pain. She is afebrile without signs of acute surgical abdomen. Laboratory workup is noncontributory and c/w baseline. CT abdomen pelvis ordered to evaluate for cause of symptoms. CT negative for acute findings.   Patient reassessed following reassuring workup. Abdominal exam is stable. She continues to complain of pain, but appears more comfortable. Will give one last dose of pain medication. Have reviewed with the patient all of her labs and imaging; patient verbalizes understanding of results. Have discussed further evaluation on an outpatient basis. Will refer patient to gastroenterology. Return precautions discussed and provided. Patient agreeable to plan with no unaddressed concerns. Patient discharged in satisfactory condition.  I personally performed the services described in this documentation, which was scribed in my presence. The recorded information has been reviewed and is accurate.    Filed Vitals:   09/23/15 0100 09/23/15 0200 09/23/15 0230 09/23/15 0326  BP: 101/74 100/74 101/67 97/69  Pulse: 72 71 76 77  Temp:      TempSrc:      Resp: 12 10 16 20   Height:      Weight:      SpO2: 95% 96% 95% 95%      Antonietta Breach, PA-C 09/23/15 0426  April Palumbo, MD 09/23/15 [id number]

## 2015-09-23 ENCOUNTER — Encounter (HOSPITAL_COMMUNITY): Payer: Self-pay

## 2015-09-23 ENCOUNTER — Emergency Department (HOSPITAL_COMMUNITY): Payer: Commercial Managed Care - HMO

## 2015-09-23 DIAGNOSIS — N281 Cyst of kidney, acquired: Secondary | ICD-10-CM | POA: Diagnosis not present

## 2015-09-23 LAB — URINALYSIS, ROUTINE W REFLEX MICROSCOPIC
BILIRUBIN URINE: NEGATIVE
GLUCOSE, UA: NEGATIVE mg/dL
Hgb urine dipstick: NEGATIVE
KETONES UR: NEGATIVE mg/dL
LEUKOCYTES UA: NEGATIVE
NITRITE: NEGATIVE
PROTEIN: NEGATIVE mg/dL
Specific Gravity, Urine: 1.027 (ref 1.005–1.030)
pH: 5.5 (ref 5.0–8.0)

## 2015-09-23 MED ORDER — DICYCLOMINE HCL 20 MG PO TABS: 20.0000 mg | ORAL_TABLET | Freq: Two times a day (BID) | ORAL | Status: DC

## 2015-09-23 MED ORDER — PROMETHAZINE HCL 25 MG PO TABS: 25.0000 mg | ORAL_TABLET | Freq: Four times a day (QID) | ORAL | Status: DC | PRN

## 2015-09-23 MED ORDER — ONDANSETRON HCL 4 MG/2ML IJ SOLN
4.0000 mg | Freq: Once | INTRAMUSCULAR | Status: AC
  Administered 2015-09-23: 4 mg via INTRAVENOUS
  Filled 2015-09-23: qty 2

## 2015-09-23 MED ORDER — IOPAMIDOL (ISOVUE-300) INJECTION 61%
100.0000 mL | Freq: Once | INTRAVENOUS | Status: AC | PRN
  Administered 2015-09-23: 100 mL via INTRAVENOUS

## 2015-09-23 MED ORDER — HYDROMORPHONE HCL 1 MG/ML IJ SOLN
1.0000 mg | Freq: Once | INTRAMUSCULAR | Status: AC
  Administered 2015-09-23: 1 mg via INTRAVENOUS
  Filled 2015-09-23: qty 1

## 2015-09-23 MED ORDER — DICYCLOMINE HCL 10 MG/ML IM SOLN
20.0000 mg | Freq: Once | INTRAMUSCULAR | Status: AC | PRN
  Administered 2015-09-23: 20 mg via INTRAMUSCULAR
  Filled 2015-09-23: qty 2

## 2015-09-23 NOTE — ED Notes (Signed)
Pt reports understanding of discharge information. No questions at time of discharge 

## 2015-09-23 NOTE — Discharge Instructions (Signed)

## 2015-09-24 ENCOUNTER — Ambulatory Visit (INDEPENDENT_AMBULATORY_CARE_PROVIDER_SITE_OTHER): Payer: Commercial Managed Care - HMO | Admitting: Adult Health

## 2015-09-24 ENCOUNTER — Encounter: Payer: Self-pay | Admitting: Adult Health

## 2015-09-24 VITALS — BP 112/60 | Temp 98.4°F | Ht 64.0 in | Wt 183.5 lb

## 2015-09-24 DIAGNOSIS — R1084 Generalized abdominal pain: Secondary | ICD-10-CM | POA: Diagnosis not present

## 2015-09-24 LAB — H. PYLORI ANTIBODY, IGG: H Pylori IgG: NEGATIVE

## 2015-09-24 MED ORDER — PANTOPRAZOLE SODIUM 40 MG PO TBEC: 40.0000 mg | DELAYED_RELEASE_TABLET | Freq: Every day | ORAL | Status: DC

## 2015-09-24 NOTE — Patient Instructions (Signed)
I am sorry you are still feeling like this.   I have sent in a strong dose of Protonix, take this daily  Stay away from spicy/acidic foods  I will follow up with you regarding your blood work   Someone will call you to schedule your appointment with GI  Let me know if you need anything

## 2015-09-24 NOTE — Progress Notes (Signed)
Subjective:    Patient ID: Leslie Benson, female    DOB: [birth year], 55 y.o.   MRN: [id number]  HPI  55 year old female who  has a past medical history of Fibromyalgia; Spinal stenosis; Panic attack; Barrett esophagus; Paraesophageal hiatal hernia; Cervical arthritis ([facility]) (09/22/2013); H/O hiatal hernia; Headache(784.0); Depression; GERD (gastroesophageal reflux disease); High cholesterol; Migraines; Thyroid disease; Fibromyalgia; Lymphocytic colitis; Breast nodule; and Elevated liver enzymes.   She presented to the ER on 09/22/2015 with the complaint of abdominal pain. Per ER note:  gradually worsening diffuse abdominal pain onset approximately 7 hours ago. Pt reports associated nausea. Pt reports that her pain first began in her RLQ but has since spread to her entire abdominal region. Pt reports that she has had 2 small bowel movement today with trouble. Pt denies a bowel movement yesterday. Pt reports that she usually has a bowel movement every two days. Pt took Tramadol with no relief. No other OTC medications or home remedies tried PTA. Pt denies a PMHx of a bowel obstruction. Pt denies dysuria, fever, vomiting, CP, or SOB.   Laboratory workup is noncontributory and c/w baseline. CT abdomen pelvis ordered to evaluate for cause of symptoms. CT negative for acute findings.   In the office today she reports that she continues to have generalized abdominal pain. The pain has been constant. She has been having normal bowel movements - per her. Denies fevers, nausea, or vomiting.    Review of Systems  Constitutional: Negative.   Respiratory: Negative.   Cardiovascular: Negative.   Genitourinary: Negative.   Neurological: Negative.   All other systems reviewed and are negative.  Past Medical History  Diagnosis Date  . Fibromyalgia   . Spinal stenosis   . Panic attack   . Barrett esophagus   . Paraesophageal hiatal hernia     repaired 2009  . Cervical arthritis ([place]) 09/22/2013  .  H/O hiatal hernia   . Headache(784.0)     migraines  . Depression   . GERD (gastroesophageal reflux disease)   . High cholesterol   . Migraines   . Thyroid disease   . Fibromyalgia   . Lymphocytic colitis   . Breast nodule     benign  . Elevated liver enzymes     Social History   Social History  . Marital Status: Married    Spouse Name: N/A  . Number of Children: N/A  . Years of Education: N/A   Occupational History  . Not on file.   Social History Main Topics  . Smoking status: Never Smoker   . Smokeless tobacco: Never Used  . Alcohol Use: 0.0 oz/week    0 Standard drinks or equivalent per week     Comment: wine occassionally  . Drug Use: No  . Sexual Activity: Yes    Birth Control/ Protection: None     Comment: n/a partial hysterectomy   Other Topics Concern  . Not on file   Social History Narrative   Married    Lived in [place].  Moved to [place] in early 2000s   She is on disability for anxiety     Past Surgical History  Procedure Laterality Date  . Tubal ligation  1990s?  Marland Kitchen Cholecystectomy  1980s?  . Vaginal hysterectomy  03/04/2001  . Ankle surgery    . Laparoscopic paraesophageal hernia repair  03/05/2008    Dr Johney Maine  . Laparoscopic nissen fundoplication  0000000  . Eus N/A 11/19/2013    Procedure: ESOPHAGEAL  ENDOSCOPIC ULTRASOUND (EUS) RADIAL;  Surgeon: Arta Silence, MD;  Location: [facility];  Service: Endoscopy;  Laterality: N/A;    Family History  Problem Relation Age of Onset  . Alcohol abuse    . Colon cancer    . Elevated Lipids    . Heart disease      Allergies  Allergen Reactions  . Codeine Nausea And Vomiting  . Compazine [Prochlorperazine Edisylate]     "feels weird"  . Sulfa Antibiotics     Unknown reaction.    Current Outpatient Prescriptions on File Prior to Visit  Medication Sig Dispense Refill  . ALPRAZolam (XANAX) 1 MG tablet Take 2 mg by mouth 3 (three) times daily as needed for anxiety (anxiety). .    .  bisacodyl (DULCOLAX) 5 MG EC tablet Take 5 mg by mouth daily as needed for mild constipation or moderate constipation.    . dicyclomine (BENTYL) 20 MG tablet Take 1 tablet (20 mg total) by mouth 2 (two) times daily. 20 tablet 0  . gabapentin (NEURONTIN) 600 MG tablet Take 1 tablet (600 mg total) by mouth 3 (three) times daily. (Patient taking differently: Take 1,200 mg by mouth 3 (three) times daily. Dr. Toy Care) 90 tablet 0  . levothyroxine (SYNTHROID, LEVOTHROID) 25 MCG tablet Take 1 tablet (25 mcg total) by mouth daily before breakfast. 90 tablet 1  . loperamide (IMODIUM) 1 MG/5ML solution Take 5 mLs (1 mg total) by mouth 3 (three) times daily as needed for diarrhea or loose stools. 30 mL 0  . omega-3 acid ethyl esters (LOVAZA) 1 G capsule Take 1 g by mouth 2 (two) times daily.    . ondansetron (ZOFRAN ODT) 4 MG disintegrating tablet Take 1 tablet (4 mg total) by mouth every 4 (four) hours as needed for nausea or vomiting. 20 tablet 0  . QUEtiapine (SEROQUEL) 400 MG tablet Take 800 mg by mouth at bedtime.    . traMADol (ULTRAM) 50 MG tablet Take 2 tablets (100 mg total) by mouth every 6 (six) hours as needed for pain. (Patient taking differently: Take 100 mg by mouth every 6 (six) hours as needed for moderate pain. Dr. Toy Care) 30 tablet 0  . venlafaxine XR (EFFEXOR-XR) 75 MG 24 hr capsule Take 75 mg by mouth 3 (three) times daily.     . pantoprazole (PROTONIX) 20 MG tablet Take 1 tablet (20 mg total) by mouth daily. (Patient not taking: Reported on 09/24/2015) 30 tablet 0  . promethazine (PHENERGAN) 25 MG tablet Take 1 tablet (25 mg total) by mouth every 6 (six) hours as needed for nausea or vomiting. (Patient not taking: Reported on 09/24/2015) 12 tablet 0   No current facility-administered medications on file prior to visit.    BP 112/60 mmHg  Temp(Src) 98.4 F (36.9 C) (Oral)  Ht 5\' 4"  (1.626 m)  Wt 183 lb 8 oz (83.235 kg)  BMI 31.48 kg/m2       Objective:   Physical Exam  Constitutional:  She is oriented to person, place, and time. She appears well-developed and well-nourished. No distress.  Cardiovascular: Normal rate, regular rhythm, normal heart sounds and intact distal pulses.  Exam reveals no gallop and no friction rub.   No murmur heard. Pulmonary/Chest: Effort normal and breath sounds normal. No respiratory distress. She has no wheezes. She has no rales. She exhibits no tenderness.  Abdominal: Soft. Bowel sounds are normal. She exhibits distension. She exhibits no mass. There is no splenomegaly or hepatomegaly. There is tenderness in the  right lower quadrant, periumbilical area, suprapubic area and left lower quadrant. There is no rebound, no guarding and no CVA tenderness.  Neurological: She is alert and oriented to person, place, and time.  Skin: Skin is warm and dry. No rash noted. She is not diaphoretic. No erythema. No pallor.  Psychiatric: She has a normal mood and affect. Her behavior is normal. Judgment and thought content normal.  Nursing note and vitals reviewed.     Assessment & Plan:  1. Generalized abdominal pain - possibly h.pylori or gastric ulcer - Reviewed notes, labs, and imaging from the ER.  - We talked at length about going to see GI, she has finally agreed to go see them.  - I will test her for h.pylori  - Increase Protonix from 20 mg to 40 mg.  - H. pylori antibody, IgG - Ambulatory referral to Gastroenterology - pantoprazole (PROTONIX) 40 MG tablet; Take 1 tablet (40 mg total) by mouth daily.  Dispense: 90 tablet; Refill: 3 - Follow up as needed  [name], NP

## 2015-10-19 ENCOUNTER — Encounter: Payer: Self-pay | Admitting: Internal Medicine

## 2015-10-19 ENCOUNTER — Other Ambulatory Visit: Payer: Self-pay | Admitting: Adult Health

## 2015-11-01 ENCOUNTER — Ambulatory Visit (INDEPENDENT_AMBULATORY_CARE_PROVIDER_SITE_OTHER): Payer: Commercial Managed Care - HMO | Admitting: Family Medicine

## 2015-11-01 ENCOUNTER — Encounter: Payer: Self-pay | Admitting: Family Medicine

## 2015-11-01 VITALS — BP 102/74 | HR 104 | Temp 98.1°F | Ht 64.0 in | Wt 187.0 lb

## 2015-11-01 DIAGNOSIS — R51 Headache: Secondary | ICD-10-CM | POA: Diagnosis not present

## 2015-11-01 DIAGNOSIS — S8011XA Contusion of right lower leg, initial encounter: Secondary | ICD-10-CM | POA: Diagnosis not present

## 2015-11-01 DIAGNOSIS — M25512 Pain in left shoulder: Secondary | ICD-10-CM

## 2015-11-01 DIAGNOSIS — S0083XA Contusion of other part of head, initial encounter: Secondary | ICD-10-CM

## 2015-11-01 DIAGNOSIS — R519 Headache, unspecified: Secondary | ICD-10-CM

## 2015-11-01 NOTE — Progress Notes (Signed)
HPI:  Acute visit for bruising R lateral face, R thigh and L shoulder pain. Mechanical fall in bathroom two days ago and landed on R thigh and face on tub wall. Thinks caught herself with L shoulder or slept wrong to cause l shoulder pain. Denies fevers, malaise, LOC, HA, vision changes, weakness, numbness, inability to bear weight or use arms or legs.  ROS: See pertinent positives and negatives per HPI.  Past Medical History  Diagnosis Date  . Fibromyalgia   . Spinal stenosis   . Panic attack   . Barrett esophagus   . Paraesophageal hiatal hernia     repaired 2009  . Cervical arthritis ([place]) 09/22/2013  . H/O hiatal hernia   . Headache(784.0)     migraines  . Depression   . GERD (gastroesophageal reflux disease)   . High cholesterol   . Migraines   . Thyroid disease   . Fibromyalgia   . Lymphocytic colitis   . Breast nodule     benign  . Elevated liver enzymes     Past Surgical History  Procedure Laterality Date  . Tubal ligation  1990s?  Marland Kitchen Cholecystectomy  1980s?  . Vaginal hysterectomy  03/04/2001  . Ankle surgery    . Laparoscopic paraesophageal hernia repair  03/05/2008    Dr Johney Maine  . Laparoscopic nissen fundoplication  0000000  . Eus N/A 11/19/2013    Procedure: ESOPHAGEAL ENDOSCOPIC ULTRASOUND (EUS) RADIAL;  Surgeon: Arta Silence, MD;  Location: [facility];  Service: Endoscopy;  Laterality: N/A;    Family History  Problem Relation Age of Onset  . Alcohol abuse    . Colon cancer    . Elevated Lipids    . Heart disease      Social History   Social History  . Marital Status: Married    Spouse Name: N/A  . Number of Children: N/A  . Years of Education: N/A   Social History Main Topics  . Smoking status: Never Smoker   . Smokeless tobacco: Never Used  . Alcohol Use: 0.0 oz/week    0 Standard drinks or equivalent per week     Comment: wine occassionally  . Drug Use: No  . Sexual Activity: Yes    Birth Control/ Protection: None     Comment:  n/a partial hysterectomy   Other Topics Concern  . None   Social History Narrative   Married    Lived in [place].  Moved to [place] in early 2000s   She is on disability for anxiety      Current outpatient prescriptions:  .  ALPRAZolam (XANAX) 1 MG tablet, Take 2 mg by mouth 3 (three) times daily as needed for anxiety (anxiety). ., Disp: , Rfl:  .  bisacodyl (DULCOLAX) 5 MG EC tablet, Take 5 mg by mouth daily as needed for mild constipation or moderate constipation., Disp: , Rfl:  .  dicyclomine (BENTYL) 20 MG tablet, Take 1 tablet (20 mg total) by mouth 2 (two) times daily., Disp: 20 tablet, Rfl: 0 .  gabapentin (NEURONTIN) 600 MG tablet, Take 1 tablet (600 mg total) by mouth 3 (three) times daily. (Patient taking differently: Take 1,200 mg by mouth 3 (three) times daily. Dr. Toy Care), Disp: 90 tablet, Rfl: 0 .  levothyroxine (SYNTHROID, LEVOTHROID) 25 MCG tablet, TAKE ONE TABLET BY MOUTH ONCE DAILY BEFORE BREAKFAST, Disp: 90 tablet, Rfl: 0 .  loperamide (IMODIUM) 1 MG/5ML solution, Take 5 mLs (1 mg total) by mouth 3 (three) times daily as needed for  diarrhea or loose stools., Disp: 30 mL, Rfl: 0 .  omega-3 acid ethyl esters (LOVAZA) 1 G capsule, Take 1 g by mouth 2 (two) times daily., Disp: , Rfl:  .  ondansetron (ZOFRAN ODT) 4 MG disintegrating tablet, Take 1 tablet (4 mg total) by mouth every 4 (four) hours as needed for nausea or vomiting., Disp: 20 tablet, Rfl: 0 .  pantoprazole (PROTONIX) 40 MG tablet, Take 1 tablet (40 mg total) by mouth daily., Disp: 90 tablet, Rfl: 3 .  promethazine (PHENERGAN) 25 MG tablet, Take 1 tablet (25 mg total) by mouth every 6 (six) hours as needed for nausea or vomiting., Disp: 12 tablet, Rfl: 0 .  QUEtiapine (SEROQUEL) 400 MG tablet, Take 800 mg by mouth at bedtime., Disp: , Rfl:  .  traMADol (ULTRAM) 50 MG tablet, Take 2 tablets (100 mg total) by mouth every 6 (six) hours as needed for pain. (Patient taking differently: Take 100 mg by mouth every 6 (six)  hours as needed for moderate pain. Dr. Toy Care), Disp: 30 tablet, Rfl: 0 .  venlafaxine XR (EFFEXOR-XR) 75 MG 24 hr capsule, Take 75 mg by mouth 3 (three) times daily. , Disp: , Rfl:   EXAM:  Filed Vitals:   11/01/15 1625  BP: 102/74  Pulse: 104  Temp: 98.1 F (36.7 C)    Body mass index is 32.08 kg/(m^2).  GENERAL: vitals reviewed and listed above, alert, oriented, appears well hydrated and in no acute distress  HEENT: small area of bruising and swelling R lateral orbit with TTP in this area including the R lateral orbital rim, conjunttiva clear, EOMI, PRRLA, visual acuity grossly intact, no obvious abnormalities on inspection of external nose and ears  NECK: no obvious masses on inspection  LUNGS: clear to auscultation bilaterally, no wheezes, rales or rhonchi, good air movement  CV: HRRR, no peripheral edema  MS: moves all extremities without noticeable abnormality, normal gait, moderate sized bruise without hematoma R thigh, no bone TTP, normal inspection L shoulder with normal ROM, strength and NV intact distal, some soft tissue TTP in the L supraspinatus muscle  PSYCH/NERUO: pleasant and cooperative, no obvious depression or anxiety, CN II-XII grossly intact, finger to nose normal, gait normal  ASSESSMENT AND PLAN:  Discussed the following assessment and plan:  Facial pain - Plan: DG Facial Bones 1-2 Views  Bruise of face, initial encounter - Plan: DG Facial Bones 1-2 Views  Contusion of leg, right, initial encounter  Left shoulder pain  -fortunately she likely suffered only soft tissue contusion and sprain -will obtain plain films R orbital rim to exclude fx though feel unlikely -ice for bruising, heat for shoulder, gentle movements of the shoulder encouraged, analgesics in safe amounts as needed for pain - warned of potential interactions with her medications -follow up with PCP in 2-3 week or as needed -Patient advised to return or notify a doctor immediately if  symptoms worsen or persist or new concerns arise.  Patient Instructions  BEFORE YOU LEAVE: -xray sheet -follow up in about 2 weeks  Ice to bruised areas.  Get the xrays.  Follow up sooner if worsening or other concerns   Colin Benton R., DO

## 2015-11-01 NOTE — Patient Instructions (Signed)
BEFORE YOU LEAVE: -xray sheet -follow up in about 2 weeks  Ice to bruised areas.  Get the xrays.  Follow up sooner if worsening or other concerns

## 2015-11-01 NOTE — Progress Notes (Signed)
Pre visit review using our clinic review tool, if applicable. No additional management support is needed unless otherwise documented below in the visit note. 

## 2015-11-02 ENCOUNTER — Ambulatory Visit (INDEPENDENT_AMBULATORY_CARE_PROVIDER_SITE_OTHER)
Admission: RE | Admit: 2015-11-02 | Discharge: 2015-11-02 | Disposition: A | Discharge status: Home / Self Care | Payer: Commercial Managed Care - HMO | Source: Ambulatory Visit | Attending: Family Medicine | Admitting: Family Medicine

## 2015-11-02 DIAGNOSIS — R519 Headache, unspecified: Secondary | ICD-10-CM

## 2015-11-02 DIAGNOSIS — S0083XA Contusion of other part of head, initial encounter: Secondary | ICD-10-CM | POA: Diagnosis not present

## 2015-11-02 DIAGNOSIS — R51 Headache: Secondary | ICD-10-CM | POA: Diagnosis not present

## 2015-11-02 DIAGNOSIS — S0511XA Contusion of eyeball and orbital tissues, right eye, initial encounter: Secondary | ICD-10-CM | POA: Diagnosis not present

## 2015-11-03 ENCOUNTER — Telehealth: Payer: Self-pay | Admitting: Adult Health

## 2015-11-03 NOTE — Telephone Encounter (Signed)
Pt would like results of xray   Please call back. °

## 2015-11-03 NOTE — Telephone Encounter (Signed)
Patient notified of Xray results. Thanks.

## 2015-11-11 ENCOUNTER — Ambulatory Visit (INDEPENDENT_AMBULATORY_CARE_PROVIDER_SITE_OTHER): Payer: Commercial Managed Care - HMO | Admitting: Adult Health

## 2015-11-11 VITALS — BP 100/70 | Temp 98.0°F | Wt 188.2 lb

## 2015-11-11 DIAGNOSIS — R8290 Unspecified abnormal findings in urine: Secondary | ICD-10-CM | POA: Diagnosis not present

## 2015-11-11 DIAGNOSIS — M25512 Pain in left shoulder: Secondary | ICD-10-CM

## 2015-11-11 DIAGNOSIS — N390 Urinary tract infection, site not specified: Secondary | ICD-10-CM | POA: Diagnosis not present

## 2015-11-11 LAB — POC URINALSYSI DIPSTICK (AUTOMATED)
Bilirubin, UA: NEGATIVE
Blood, UA: NEGATIVE
GLUCOSE UA: NEGATIVE
Ketones, UA: NEGATIVE
Nitrite, UA: NEGATIVE
PROTEIN UA: NEGATIVE
Spec Grav, UA: 1.025
UROBILINOGEN UA: 0.2
pH, UA: 7

## 2015-11-11 MED ORDER — CIPROFLOXACIN HCL 500 MG PO TABS: 500.0000 mg | ORAL_TABLET | Freq: Two times a day (BID) | ORAL | Status: DC

## 2015-11-11 NOTE — Addendum Note (Signed)
Addended by: Miles Costain T on: 11/11/2015 05:01 PM   Modules accepted: Orders

## 2015-11-11 NOTE — Progress Notes (Signed)
Pre visit review using our clinic review tool, if applicable. No additional management support is needed unless otherwise documented below in the visit note. 

## 2015-11-11 NOTE — Progress Notes (Signed)
   Subjective:    Patient ID: Leslie Benson, female    DOB: [birth year], 55 y.o.   MRN: [id number]  HPI  55 year old female who presents to the office today for follow up after seeing Dr. Maudie Mercury on 7/10 s/p mechanical fall in the bathroom. She landed on her right thigh and hit her face on the bathtub. She believes that she caught herself with her left shoulder. Today in the office she reports that she is feeling better but continues to have pain in her left shoulder and arm with pain that radiates into the back of her neck.   She has been using a heating pad, and tramadol which she endorses works to alleviate the pain.    In addition, she also has had 3 days of burning with urination, urinary frequency, and the feeling of incomplete bladder emptying.    Review of Systems  Constitutional: Negative.   Respiratory: Negative.   Cardiovascular: Negative.   Genitourinary: Positive for dysuria, urgency and frequency. Negative for flank pain and decreased urine volume.  Musculoskeletal: Positive for myalgias, neck pain and neck stiffness. Negative for back pain, joint swelling, arthralgias and gait problem.  Neurological: Negative.   All other systems reviewed and are negative.      Objective:   Physical Exam  Constitutional: She is oriented to person, place, and time. She appears well-developed and well-nourished. No distress.  Cardiovascular: Normal rate, regular rhythm, normal heart sounds and intact distal pulses.  Exam reveals no gallop and no friction rub.   No murmur heard. Pulmonary/Chest: Effort normal and breath sounds normal. No respiratory distress. She has no wheezes. She has no rales. She exhibits no tenderness.  Abdominal: Soft. Bowel sounds are normal. She exhibits no distension and no mass. There is no tenderness. There is no rebound, no guarding and no CVA tenderness.  Musculoskeletal: Normal range of motion. She exhibits tenderness.       Left shoulder: She exhibits  tenderness (to trapezus and teres major) and pain. She exhibits normal range of motion, no bony tenderness, no swelling, no effusion, no crepitus, no deformity, no spasm and normal strength.       Left elbow: Normal.       Left wrist: Normal.  Neurological: She is alert and oriented to person, place, and time. She has normal strength and normal reflexes. She displays no tremor. No sensory deficit. GCS eye subscore is 4. GCS verbal subscore is 5. GCS motor subscore is 6.  Skin: Skin is warm and dry. No rash noted. She is not diaphoretic. No erythema. No pallor.  Psychiatric: She has a normal mood and affect. Her behavior is normal. Judgment and thought content normal.  Nursing note and vitals reviewed.     Assessment & Plan:  1. Urinary tract infection, site not specified - Urine culture - ciprofloxacin (CIPRO) 500 MG tablet; Take 1 tablet (500 mg total) by mouth 2 (two) times daily.  Dispense: 6 tablet; Refill: 0 - Urinalysis - + Leuks  2. Left shoulder pain - appears to be strained muscle.  - Advised to continue with treatment as she is currently doing - She can take Aleve/Motrin  - Follow up if no improvement   [name], NP

## 2015-11-14 LAB — URINE CULTURE

## 2015-11-18 ENCOUNTER — Ambulatory Visit (INDEPENDENT_AMBULATORY_CARE_PROVIDER_SITE_OTHER): Payer: Commercial Managed Care - HMO | Admitting: Internal Medicine

## 2015-11-18 ENCOUNTER — Encounter: Payer: Self-pay | Admitting: Internal Medicine

## 2015-11-18 VITALS — BP 100/60 | HR 60 | Ht 64.0 in | Wt 183.0 lb

## 2015-11-18 DIAGNOSIS — K589 Irritable bowel syndrome without diarrhea: Secondary | ICD-10-CM

## 2015-11-18 DIAGNOSIS — R1010 Upper abdominal pain, unspecified: Secondary | ICD-10-CM

## 2015-11-18 DIAGNOSIS — K219 Gastro-esophageal reflux disease without esophagitis: Secondary | ICD-10-CM | POA: Diagnosis not present

## 2015-11-18 DIAGNOSIS — K760 Fatty (change of) liver, not elsewhere classified: Secondary | ICD-10-CM

## 2015-11-18 DIAGNOSIS — R1011 Right upper quadrant pain: Secondary | ICD-10-CM

## 2015-11-18 DIAGNOSIS — G8929 Other chronic pain: Secondary | ICD-10-CM

## 2015-11-18 NOTE — Patient Instructions (Signed)
Please follow up as needed 

## 2015-11-18 NOTE — Progress Notes (Signed)
HISTORY OF PRESENT ILLNESS:  Leslie Benson is a 55 y.o. female on disability for fibromyalgia and chronic pain who is referred by her primary provider Netta Corrigan NP for chronic abdominal pain and elevated liver tests as well as establishing GI care. Patient was a long-standing patient of Dr. Herbie Baltimore Buccini of [facility]. However, the patient tells me that she was discharged from her [facility] PCP for "missing an appointment". Multiple records from her previous GI evaluations have been reviewed. This took proximally 45 minutes. Summary as follows: Most recent office visit available with Dr. Cristina Gong 09/04/2014. Impression at that time was chronic right upper quadrant pain felt to be musculoskeletal and elevated liver tests consistent with fatty infiltration. Review of records shows that other listed GI problems are lymphocytic colitis, elevated transaminases 1.5-2 times the upper limit of normal, constipation. Short segment Barrett's esophagus with regression not requiring follow-up. GERD. Last upper endoscopy June 2007 with nondysplastic Barrett's. Colonoscopy October 2010 normal. Follow-up in 10 years recommended. Colonoscopy with biopsies 2004. Status post cholecystectomy. Ultrasound and CT scan of the abdomen May and June 2017 with incidental abnormalities including nonspecific hypodensities of the liver. Last laboratories May 2017. Liver tests normal except for ALT of 67. Normal CBC. Endoscopic ultrasound with Dr. Paulita Fujita July 2015. Possible steatosis. Otherwise negative. History of paraesophageal hiatal hernia repaired by Dr. gross November 2009, prior esophageal manometry preoperatively essentially normal. Patient tells me that she takes pantoprazole 40 mg each morning for GERD. For the most part this works. She does take occasional Tums for breakthrough. This helps. No dysphagia. She does report problems with obstipation alternating with diarrhea. As well, bloating. She is trying to lose weight. She  continues with intermittent right upper quadrant pain. Actually has pain in multiple areas of her body due to fibromyalgia.  REVIEW OF SYSTEMS:  All non-GI ROS negative except for sinus and allergy trouble, anxiety, back pain, depression, fatigue, headaches, night sweats, sleeping problems  Past Medical History:  Diagnosis Date  . Barrett esophagus   . Breast nodule    benign  . Cervical arthritis ([place]) 09/22/2013  . Depression   . Elevated liver enzymes   . Fibromyalgia   . Fibromyalgia   . GERD (gastroesophageal reflux disease)   . H/O hiatal hernia   . Headache(784.0)    migraines  . High cholesterol   . Lymphocytic colitis   . Migraines   . Panic attack   . Paraesophageal hiatal hernia    repaired 2009  . Spinal stenosis   . Thyroid disease     Past Surgical History:  Procedure Laterality Date  . ANKLE SURGERY    . CHOLECYSTECTOMY  1980s?  . EUS N/A 11/19/2013   Procedure: ESOPHAGEAL ENDOSCOPIC ULTRASOUND (EUS) RADIAL;  Surgeon: Arta Silence, MD;  Location: [facility];  Service: Endoscopy;  Laterality: N/A;  . LAPAROSCOPIC NISSEN FUNDOPLICATION  0000000  . LAPAROSCOPIC PARAESOPHAGEAL HERNIA REPAIR  03/05/2008   Dr Johney Maine  . TUBAL LIGATION  1990s?  Marland Kitchen VAGINAL HYSTERECTOMY  03/04/2001    Social History JULIE-ANNE POPESCU  reports that she has never smoked. She has never used smokeless tobacco. She reports that she drinks alcohol. She reports that she does not use drugs.  family history is not on file.  Allergies  Allergen Reactions  . Codeine Nausea And Vomiting  . Compazine [Prochlorperazine Edisylate]     "feels weird"  . Sulfa Antibiotics     Unknown reaction.       PHYSICAL EXAMINATION: Vital  signs: BP 100/60   Pulse 60   Ht 5\' 4"  (1.626 m)   Wt 183 lb (83 kg)   BMI 31.41 kg/m   Constitutional: Overweight but generally well-appearing, no acute distress Psychiatric: Flat affect, depressed-appearing, alert and oriented x3, cooperative Eyes:  extraocular movements intact, anicteric, conjunctiva pink Mouth: oral pharynx moist, no lesions Neck: supple without thyromegaly Lymph: no lymphadenopathy Cardiovascular: heart regular rate and rhythm, no murmur Lungs: clear to auscultation bilaterally Abdomen: soft, obese, point tenderness superficially in multiple areas with minimal palpation, nondistended, no obvious ascites, no peritoneal signs, normal bowel sounds, no organomegaly Rectal: Omitted Extremities: no clubbing cyanosis or lower extremity edema bilaterally Skin: no lesions on visible extremities Neuro: No focal deficits. Cranial nerves intact. No asterixis.  ASSESSMENT:  #1. GERD. History of Barrett's with regression #2. History of paraesophageal hernia. Status post repair 2009 #3. Most recent colonoscopy 2010. Normal. Follow-up in 10 years recommended #4. Reported history of lymphocytic colitis #5. Irritable bowel syndrome with alternating bowel habits #6. Musculoskeletal pain related to fibromyalgia #7. Status post cholecystectomy #8. History of elevated transaminases likely fatty liver   PLAN:  #1. Reflux precautions #2. Continue PPI to control GERD symptoms. Lowest dose to maintain symptom relief recommended #3. Weight loss and exercise #4. Metamucil one to 2 tablespoons daily for alternating bowel habits #5. Continue follow-up with PCP for chronic pain syndrome and multiple medical problems  #6. Routine GI follow-up one year #. Routine follow-up screening colonoscopy 2020  A copy of this consultation note has been sent to Mr. Nafzinger

## 2015-12-09 DIAGNOSIS — N390 Urinary tract infection, site not specified: Secondary | ICD-10-CM | POA: Diagnosis not present

## 2016-01-21 ENCOUNTER — Other Ambulatory Visit: Payer: Self-pay | Admitting: Adult Health

## 2016-02-09 DIAGNOSIS — N179 Acute kidney failure, unspecified: Secondary | ICD-10-CM | POA: Diagnosis not present

## 2016-02-15 ENCOUNTER — Telehealth: Payer: Self-pay | Admitting: Adult Health

## 2016-02-15 NOTE — Telephone Encounter (Signed)
Leslie Benson pt not sure if you called her to let her know what her results from the kidney doctor was b/c she received a call but was unable to answer the call.

## 2016-04-11 ENCOUNTER — Other Ambulatory Visit: Payer: Self-pay | Admitting: Emergency Medicine

## 2016-04-11 ENCOUNTER — Telehealth: Payer: Self-pay | Admitting: Adult Health

## 2016-04-11 ENCOUNTER — Other Ambulatory Visit: Payer: Self-pay

## 2016-04-11 DIAGNOSIS — R1084 Generalized abdominal pain: Secondary | ICD-10-CM

## 2016-04-11 MED ORDER — PANTOPRAZOLE SODIUM 40 MG PO TBEC: 40.0000 mg | DELAYED_RELEASE_TABLET | Freq: Every day | ORAL | 3 refills | Status: DC

## 2016-04-11 MED ORDER — LEVOTHYROXINE SODIUM 25 MCG PO TABS: ORAL_TABLET | ORAL | 1 refills | Status: DC

## 2016-04-11 NOTE — Telephone Encounter (Signed)
Rx's have been refilled & sent to [facility]. Thanks!

## 2016-04-11 NOTE — Telephone Encounter (Signed)
° ° ° °  Pt said she is now using [facility] and will need a new rx sent to them     levothyroxine (SYNTHROID, LEVOTHROID) 25 MCG tablet   pantoprazole (PROTONIX) 40 MG     Pharmacy;  [facility]  Fax no. [phone]

## 2016-07-06 DIAGNOSIS — M542 Cervicalgia: Secondary | ICD-10-CM | POA: Diagnosis not present

## 2016-07-15 DIAGNOSIS — S138XXA Sprain of joints and ligaments of other parts of neck, initial encounter: Secondary | ICD-10-CM | POA: Diagnosis not present

## 2016-07-20 DIAGNOSIS — M50822 Other cervical disc disorders at C5-C6 level: Secondary | ICD-10-CM | POA: Diagnosis not present

## 2016-07-20 DIAGNOSIS — M542 Cervicalgia: Secondary | ICD-10-CM | POA: Diagnosis not present

## 2016-10-02 ENCOUNTER — Other Ambulatory Visit: Payer: Self-pay | Admitting: Adult Health

## 2016-12-15 ENCOUNTER — Encounter: Payer: Self-pay | Admitting: Adult Health

## 2016-12-15 ENCOUNTER — Ambulatory Visit (INDEPENDENT_AMBULATORY_CARE_PROVIDER_SITE_OTHER): Payer: Commercial Managed Care - HMO | Admitting: Adult Health

## 2016-12-15 VITALS — BP 100/72 | Temp 97.7°F | Wt 189.0 lb

## 2016-12-15 DIAGNOSIS — D492 Neoplasm of unspecified behavior of bone, soft tissue, and skin: Secondary | ICD-10-CM | POA: Diagnosis not present

## 2016-12-15 DIAGNOSIS — L570 Actinic keratosis: Secondary | ICD-10-CM | POA: Diagnosis not present

## 2016-12-15 DIAGNOSIS — T148XXA Other injury of unspecified body region, initial encounter: Secondary | ICD-10-CM

## 2016-12-15 DIAGNOSIS — L821 Other seborrheic keratosis: Secondary | ICD-10-CM | POA: Diagnosis not present

## 2016-12-15 MED ORDER — TIZANIDINE HCL 4 MG PO TABS: 4.0000 mg | ORAL_TABLET | Freq: Four times a day (QID) | ORAL | 0 refills | Status: DC | PRN

## 2016-12-15 NOTE — Progress Notes (Signed)
Subjective:    Patient ID: Leslie Benson, female    DOB: 19-Jan-1961, 56 y.o.   MRN: [id number]  HPI   56 year old female who  has a past medical history of Barrett esophagus; Breast nodule; Cervical arthritis ([place]) (09/22/2013); Depression; Elevated liver enzymes; Fibromyalgia; Fibromyalgia; GERD (gastroesophageal reflux disease); H/O hiatal hernia; Headache(784.0); High cholesterol; Lymphocytic colitis; Migraines; Panic attack; Paraesophageal hiatal hernia; Spinal stenosis; and Thyroid disease.   She presents to the office today with multiple complaints.   1. " I have some moles I would like removed"   2. " I have pain under my right shoulder blade" The pain is worse with movements such as bending and twisting. She denies any trauma to the area. The pain is described as " aching and sharp". She has been using tramadol and a heating pad without much relief.    Review of Systems See HPI   Past Medical History:  Diagnosis Date  . Barrett esophagus   . Breast nodule    benign  . Cervical arthritis ([place]) 09/22/2013  . Depression   . Elevated liver enzymes   . Fibromyalgia   . Fibromyalgia   . GERD (gastroesophageal reflux disease)   . H/O hiatal hernia   . Headache(784.0)    migraines  . High cholesterol   . Lymphocytic colitis   . Migraines   . Panic attack   . Paraesophageal hiatal hernia    repaired 2009  . Spinal stenosis   . Thyroid disease     Social History   Social History  . Marital status: Married    Spouse name: N/A  . Number of children: N/A  . Years of education: N/A   Occupational History  . Not on file.   Social History Main Topics  . Smoking status: Never Smoker  . Smokeless tobacco: Never Used  . Alcohol use 0.0 oz/week     Comment: wine occassionally  . Drug use: No  . Sexual activity: Yes    Birth control/ protection: None     Comment: n/a partial hysterectomy   Other Topics Concern  . Not on file   Social History Narrative   Married    Lived in [place].  Moved to [place] in early 2000s   She is on disability for anxiety     Past Surgical History:  Procedure Laterality Date  . ANKLE SURGERY    . CHOLECYSTECTOMY  1980s?  . EUS N/A 11/19/2013   Procedure: ESOPHAGEAL ENDOSCOPIC ULTRASOUND (EUS) RADIAL;  Surgeon: Arta Silence, MD;  Location: [facility];  Service: Endoscopy;  Laterality: N/A;  . LAPAROSCOPIC NISSEN FUNDOPLICATION  17/51/0258  . LAPAROSCOPIC PARAESOPHAGEAL HERNIA REPAIR  03/05/2008   Dr Johney Maine  . TUBAL LIGATION  1990s?  Marland Kitchen VAGINAL HYSTERECTOMY  03/04/2001    Family History  Problem Relation Age of Onset  . Alcohol abuse Unknown   . Colon cancer Unknown   . Elevated Lipids Unknown   . Heart disease Unknown     Allergies  Allergen Reactions  . Codeine Nausea And Vomiting  . Compazine [Prochlorperazine Edisylate]     "feels weird"  . Sulfa Antibiotics     Unknown reaction.    Current Outpatient Prescriptions on File Prior to Visit  Medication Sig Dispense Refill  . ALPRAZolam (XANAX) 1 MG tablet Take 2 mg by mouth 3 (three) times daily as needed for anxiety (anxiety). .    . bisacodyl (DULCOLAX) 5 MG EC tablet Take 5 mg by mouth  daily as needed for mild constipation or moderate constipation.    . gabapentin (NEURONTIN) 600 MG tablet Take 1 tablet (600 mg total) by mouth 3 (three) times daily. (Patient taking differently: Take 1,200 mg by mouth 3 (three) times daily. Dr. Toy Care) 90 tablet 0  . levothyroxine (SYNTHROID, LEVOTHROID) 25 MCG tablet TAKE ONE TABLET BY MOUTH ONCE DAILY BEFORE BREAKFAST 90 tablet 1  . omega-3 acid ethyl esters (LOVAZA) 1 G capsule Take 1 g by mouth 2 (two) times daily.    . pantoprazole (PROTONIX) 40 MG tablet Take 1 tablet (40 mg total) by mouth daily. 90 tablet 3  . QUEtiapine (SEROQUEL) 400 MG tablet Take 800 mg by mouth at bedtime.    . traMADol (ULTRAM) 50 MG tablet Take 2 tablets (100 mg total) by mouth every 6 (six) hours as needed for pain. (Patient  taking differently: Take 100 mg by mouth every 6 (six) hours as needed for moderate pain. Dr. Toy Care) 30 tablet 0  . venlafaxine XR (EFFEXOR-XR) 75 MG 24 hr capsule Take 75 mg by mouth 3 (three) times daily.      No current facility-administered medications on file prior to visit.     BP 100/72   Temp 97.7 F (36.5 C) (Oral)   Wt 189 lb (85.7 kg)   BMI 32.44 kg/m       Objective:   Physical Exam  Constitutional: She is oriented to person, place, and time. She appears well-developed and well-nourished. No distress.  Cardiovascular: Normal rate, regular rhythm, normal heart sounds and intact distal pulses.  Exam reveals no gallop and no friction rub.   No murmur heard. Pulmonary/Chest: Effort normal and breath sounds normal. No respiratory distress. She has no wheezes. She has no rales. She exhibits no tenderness.  Musculoskeletal: Normal range of motion. She exhibits tenderness. She exhibits no edema or deformity.  Pain with palpation to right latissimus dorsi. No bruising noted.   Neurological: She is alert and oriented to person, place, and time.  Skin: Skin is warm and dry. No rash noted. She is not diaphoretic. No erythema. No pallor.  Multiple AK's and benign appearing nevi throughout torso. The ones she would like removed are:  1. 1.5 cm x 1 cm AK located on right shoulder blade 2. 0.7 cm x 0.5 cm AK located on right lower lateral breast    Psychiatric: She has a normal mood and affect. Her behavior is normal. Judgment and thought content normal.  Nursing note and vitals reviewed.     Assessment & Plan:  1. Skin neoplasm - Procedure including risks/benefits explained to patient.  Questions were answered. After informed consent was obtained and a time out completed, site was cleansed with betadine and then alcohol. 1% Lidocaine with epinephrine was injected under lesion and then shave biopsy was performed. Area was cauterized to obtain hemostasis.  Pt tolerated procedure well.   Specimen sent for pathology review.  Pt instructed to keep the area dry for 24 hours and to contact us if he develops redness, drainage or swelling at the site.  Pt may use tylenol as needed for discomfort today.   - Dermatology pathology  2. Muscle strain  - tiZANidine (ZANAFLEX) 4 MG tablet; Take 1 tablet (4 mg total) by mouth every 6 (six) hours as needed for muscle spasms.  Dispense: 15 tablet; Refill: 0

## 2016-12-19 ENCOUNTER — Telehealth: Payer: Self-pay | Admitting: Adult Health

## 2016-12-19 NOTE — Telephone Encounter (Signed)
Pt returned your call.  

## 2016-12-19 NOTE — Telephone Encounter (Signed)
Pt notified of bx results.

## 2017-02-13 ENCOUNTER — Ambulatory Visit: Payer: Medicare HMO | Admitting: Adult Health

## 2017-02-13 DIAGNOSIS — H6091 Unspecified otitis externa, right ear: Secondary | ICD-10-CM | POA: Diagnosis not present

## 2017-03-12 ENCOUNTER — Other Ambulatory Visit: Payer: Self-pay | Admitting: Adult Health

## 2017-03-12 DIAGNOSIS — R1084 Generalized abdominal pain: Secondary | ICD-10-CM

## 2017-03-13 NOTE — Telephone Encounter (Signed)
Ok to refill. Needs CPE for more refills

## 2017-03-13 NOTE — Telephone Encounter (Signed)
No cpx since 07/2015.  Please advise.

## 2017-03-13 NOTE — Telephone Encounter (Signed)
Sent to the pharmacy by e-scribe for 90 days.  Left a message for the pt to return my call.  CRM created.

## 2017-04-05 ENCOUNTER — Ambulatory Visit (INDEPENDENT_AMBULATORY_CARE_PROVIDER_SITE_OTHER): Payer: Medicare HMO | Admitting: Adult Health

## 2017-04-05 ENCOUNTER — Encounter: Payer: Self-pay | Admitting: Adult Health

## 2017-04-05 VITALS — BP 110/80 | HR 96 | Temp 97.8°F | Ht 64.0 in | Wt 188.0 lb

## 2017-04-05 DIAGNOSIS — E039 Hypothyroidism, unspecified: Secondary | ICD-10-CM

## 2017-04-05 DIAGNOSIS — E669 Obesity, unspecified: Secondary | ICD-10-CM | POA: Diagnosis not present

## 2017-04-05 DIAGNOSIS — Z23 Encounter for immunization: Secondary | ICD-10-CM | POA: Diagnosis not present

## 2017-04-05 DIAGNOSIS — Z0000 Encounter for general adult medical examination without abnormal findings: Secondary | ICD-10-CM

## 2017-04-05 LAB — LIPID PANEL
CHOL/HDL RATIO: 4
CHOLESTEROL: 257 mg/dL — AB (ref 0–200)
HDL: 66.6 mg/dL (ref >39.00)
NonHDL: 190.52
Triglycerides: 228 mg/dL — ABNORMAL HIGH (ref 0.0–149.0)
VLDL: 45.6 mg/dL — ABNORMAL HIGH (ref 0.0–40.0)

## 2017-04-05 LAB — BASIC METABOLIC PANEL
BUN: 10 mg/dL (ref 6–23)
CO2: 30 mEq/L (ref 19–32)
Calcium: 9.4 mg/dL (ref 8.4–10.5)
Chloride: 102 mEq/L (ref 96–112)
Creatinine, Ser: 0.98 mg/dL (ref 0.40–1.20)
GFR: 61.91 mL/min (ref >60.00)
Glucose, Bld: 96 mg/dL (ref 70–99)
POTASSIUM: 4 meq/L (ref 3.5–5.1)
SODIUM: 140 meq/L (ref 135–145)

## 2017-04-05 LAB — CBC WITH DIFFERENTIAL/PLATELET
BASOS ABS: 0 10*3/uL (ref 0.0–0.1)
Basophils Relative: 0.8 % (ref 0.0–3.0)
EOS PCT: 1.6 % (ref 0.0–5.0)
Eosinophils Absolute: 0.1 10*3/uL (ref 0.0–0.7)
HCT: 41 % (ref 36.0–46.0)
Hemoglobin: 13.7 g/dL (ref 12.0–15.0)
Lymphocytes Relative: 45 % (ref 12.0–46.0)
Lymphs Abs: 1.8 10*3/uL (ref 0.7–4.0)
MCHC: 33.3 g/dL (ref 30.0–36.0)
MCV: 92.9 fl (ref 78.0–100.0)
MONO ABS: 0.4 10*3/uL (ref 0.1–1.0)
MONOS PCT: 8.8 % (ref 3.0–12.0)
Neutro Abs: 1.8 10*3/uL (ref 1.4–7.7)
Neutrophils Relative %: 43.8 % (ref 43.0–77.0)
Platelets: 237 10*3/uL (ref 150.0–400.0)
RBC: 4.42 Mil/uL (ref 3.87–5.11)
RDW: 13.6 % (ref 11.5–15.5)
WBC: 4 10*3/uL (ref 4.0–10.5)

## 2017-04-05 LAB — HEPATIC FUNCTION PANEL
ALK PHOS: 132 U/L — AB (ref 39–117)
ALT: 25 U/L (ref 0–35)
AST: 17 U/L (ref 0–37)
Albumin: 4.6 g/dL (ref 3.5–5.2)
BILIRUBIN TOTAL: 0.4 mg/dL (ref 0.2–1.2)
Bilirubin, Direct: 0.1 mg/dL (ref 0.0–0.3)
Total Protein: 7.4 g/dL (ref 6.0–8.3)

## 2017-04-05 LAB — LDL CHOLESTEROL, DIRECT: LDL DIRECT: 168 mg/dL

## 2017-04-05 LAB — HEMOGLOBIN A1C: HEMOGLOBIN A1C: 5.5 % (ref 4.6–6.5)

## 2017-04-05 LAB — TSH: TSH: 2.05 u[IU]/mL (ref 0.35–4.50)

## 2017-04-05 NOTE — Patient Instructions (Signed)
It was great seeing you today   I will follow up with you regarding your blood work   Continue to work on weight loss - try Keto diet.   If you need anything, please let me know

## 2017-04-05 NOTE — Progress Notes (Signed)
Subjective:    Patient ID: [name], female    DOB: 05/16/1960, 56 y.o.   MRN: [id number]  HPI  Patient presents for yearly preventative medicine examination. She is a pleasant 56 year old female who  has a past medical history of Barrett esophagus, Breast nodule, Cervical arthritis (09/22/2013), Depression, Elevated liver enzymes, Fibromyalgia, Fibromyalgia, GERD (gastroesophageal reflux disease), H/O hiatal hernia, Headache(784.0), High cholesterol, Lymphocytic colitis, Migraines, Panic attack, Paraesophageal hiatal hernia, Spinal stenosis, and Thyroid disease.  She takes Effexor and Seroquel for mental healthy issues. She is followed by psychiatry   She takes Tramadol PRN and Gabapentin for Fibromyalgia pain.   She takes Synthroid for hypothyroidism   All immunizations and health maintenance protocols were reviewed with the patient and needed orders were placed. She is due for Tdap  Appropriate screening laboratory values were ordered for the patient including screening of hyperlipidemia, renal function and hepatic function.  Medication reconciliation,  past medical history, social history, problem list and allergies were reviewed in detail with the patient  Goals were established with regard to weight loss, exercise, and  diet in compliance with medications.  She continues to walk and plans on going to the [facility].  She is trying to eat healthy.   Wt Readings from Last 3 Encounters:  04/05/17 188 lb (85.3 kg)  12/15/16 189 lb (85.7 kg)  11/18/15 183 lb (83 kg)   She reports having a colonoscopy at [facility] about 5 years ago.  She has not had a dental or vision exam done this year. She no longer needs a PAP due to history of hysterectomy. She is due for her mammogram in May 2019. She does home breast exams and has not noticed any changes in her breast  She reports that she continues to feel fatigued most of the day. We reviewed medications to which she is on some  that will cause fatigue.   Review of Systems  Constitutional: Positive for fatigue.  HENT: Negative.   Eyes: Negative.   Respiratory: Negative.   Cardiovascular: Negative.   Gastrointestinal: Negative.   Endocrine: Negative.   Genitourinary: Negative.   Musculoskeletal: Positive for arthralgias and myalgias.  Skin: Negative.   Allergic/Immunologic: Negative.   Neurological: Negative.   Hematological: Negative.   Psychiatric/Behavioral: Positive for sleep disturbance. Negative for hallucinations and suicidal ideas.   Past Medical History:  Diagnosis Date  . Barrett esophagus   . Breast nodule    benign  . Cervical arthritis 09/22/2013  . Depression   . Elevated liver enzymes   . Fibromyalgia   . Fibromyalgia   . GERD (gastroesophageal reflux disease)   . H/O hiatal hernia   . Headache(784.0)    migraines  . High cholesterol   . Lymphocytic colitis   . Migraines   . Panic attack   . Paraesophageal hiatal hernia    repaired 2009  . Spinal stenosis   . Thyroid disease     Social History   Socioeconomic History  . Marital status: Married    Spouse name: Not on file  . Number of children: Not on file  . Years of education: Not on file  . Highest education level: Not on file  Social Needs  . Financial resource strain: Not on file  . Food insecurity - worry: Not on file  . Food insecurity - inability: Not on file  . Transportation needs - medical: Not on file  . Transportation needs - non-medical: Not on file  Occupational History  . Not on file  Tobacco Use  . Smoking status: Never Smoker  . Smokeless tobacco: Never Used  Substance and Sexual Activity  . Alcohol use: Yes    Alcohol/week: 0.0 oz    Comment: wine occassionally  . Drug use: No  . Sexual activity: Yes    Birth control/protection: None    Comment: n/a partial hysterectomy  Other Topics Concern  . Not on file  Social History Narrative   Married    Lived in [place].  Moved to [place] in early  2000s   She is on disability for anxiety     Past Surgical History:  Procedure Laterality Date  . ANKLE SURGERY    . CHOLECYSTECTOMY  1980s?  . EUS N/A 11/19/2013   Procedure: ESOPHAGEAL ENDOSCOPIC ULTRASOUND (EUS) RADIAL;  Surgeon: Arta Silence, MD;  Location: [facility];  Service: Endoscopy;  Laterality: N/A;  . LAPAROSCOPIC NISSEN FUNDOPLICATION  17/61/6073  . LAPAROSCOPIC PARAESOPHAGEAL HERNIA REPAIR  03/05/2008   Dr Johney Maine  . TUBAL LIGATION  1990s?  Marland Kitchen VAGINAL HYSTERECTOMY  03/04/2001    Family History  Problem Relation Age of Onset  . Alcohol abuse Unknown   . Colon cancer Unknown   . Elevated Lipids Unknown   . Heart disease Unknown     Allergies  Allergen Reactions  . Codeine Nausea And Vomiting  . Compazine [Prochlorperazine Edisylate]     "feels weird"  . Sulfa Antibiotics     Unknown reaction.    Current Outpatient Medications on File Prior to Visit  Medication Sig Dispense Refill  . ALPRAZolam (XANAX) 1 MG tablet Take 2 mg by mouth 3 (three) times daily as needed for anxiety (anxiety). .    . bisacodyl (DULCOLAX) 5 MG EC tablet Take 5 mg by mouth daily as needed for mild constipation or moderate constipation.    . gabapentin (NEURONTIN) 600 MG tablet Take 1 tablet (600 mg total) by mouth 3 (three) times daily. (Patient taking differently: Take 1,200 mg by mouth 3 (three) times daily. Dr. Toy Care) 90 tablet 0  . levothyroxine (SYNTHROID, LEVOTHROID) 25 MCG tablet TAKE 1 TABLET ONE TIME DAILY BEFORE BREAKFAST 90 tablet 0  . omega-3 acid ethyl esters (LOVAZA) 1 G capsule Take 1 g by mouth 2 (two) times daily.    . pantoprazole (PROTONIX) 40 MG tablet TAKE 1 TABLET (40 MG TOTAL) BY MOUTH DAILY. 90 tablet 0  . QUEtiapine (SEROQUEL) 400 MG tablet Take 800 mg by mouth at bedtime.    . traMADol (ULTRAM) 50 MG tablet Take 2 tablets (100 mg total) by mouth every 6 (six) hours as needed for pain. (Patient taking differently: Take 100 mg by mouth every 6 (six) hours as needed  for moderate pain. Dr. Toy Care) 30 tablet 0  . venlafaxine XR (EFFEXOR-XR) 75 MG 24 hr capsule Take 75 mg by mouth 3 (three) times daily.      No current facility-administered medications on file prior to visit.     BP 110/80 (BP Location: Left Arm, Patient Position: Sitting, Cuff Size: Normal)   Pulse 96   Temp 97.8 F (36.6 C) (Oral)   Ht 5\' 4"  (1.626 m)   Wt 188 lb (85.3 kg)   SpO2 97%   BMI 32.27 kg/m       Objective:   Physical Exam  Constitutional: She is oriented to person, place, and time. She appears well-developed and well-nourished. No distress.  HENT:  Head: Normocephalic and atraumatic.  Right Ear: External ear  normal.  Left Ear: External ear normal.  Nose: Nose normal.  Mouth/Throat: Oropharynx is clear and moist. No oropharyngeal exudate.  Eyes: Conjunctivae and EOM are normal. Pupils are equal, round, and reactive to light. Right eye exhibits no discharge. Left eye exhibits no discharge. No scleral icterus.  Neck: Normal range of motion. Neck supple. No JVD present. No tracheal deviation present. No thyromegaly present.  Cardiovascular: Normal rate, regular rhythm, normal heart sounds and intact distal pulses. Exam reveals no gallop and no friction rub.  No murmur heard. Pulmonary/Chest: Effort normal and breath sounds normal. No stridor. No respiratory distress. She has no wheezes. She has no rales. She exhibits no tenderness.  Abdominal: Soft. Bowel sounds are normal. She exhibits no distension and no mass. There is no tenderness. There is no rebound and no guarding.  Genitourinary:  Genitourinary Comments: Breast Exam: No masses, lumps, dimpling, or discharge   Musculoskeletal: Normal range of motion. She exhibits no edema, tenderness or deformity.  Pain at multiple sites throughout body due to fibromyalgia    Lymphadenopathy:    She has no cervical adenopathy.  Neurological: She is alert and oriented to person, place, and time. She has normal reflexes. She  displays normal reflexes. No cranial nerve deficit. She exhibits normal muscle tone. Coordination normal.  Skin: Skin is warm and dry. No rash noted. She is not diaphoretic. No erythema. No pallor.  Psychiatric: She has a normal mood and affect. Her behavior is normal. Judgment and thought content normal.  Nursing note and vitals reviewed.     Assessment & Plan:  1. Routine general medical examination at a health care facility - Encouraged weight loss through diet and exercise  - Follow up in one year or sooner if needed - Will get records about colonoscopy  - Basic metabolic panel - CBC with Differential/Platelet - Hepatic function panel - Lipid panel - TSH - Hemoglobin A1c  2. Hypothyroidism, unspecified type - Consider increasing Synthroid  - Basic metabolic panel - CBC with Differential/Platelet - Hepatic function panel - Lipid panel - TSH - Hemoglobin A1c  3. Need for Tdap vaccination  - Tdap vaccine greater than or equal to 7yo IM  4. Obesity (BMI 30.0-34.9) - Encouraged weight loss through diet and exercise. Educated on the importance of increased aerobic activity  - Basic metabolic panel - CBC with Differential/Platelet - Hepatic function panel - Lipid panel - TSH - Hemoglobin A1c  [name], NP

## 2017-04-06 ENCOUNTER — Other Ambulatory Visit: Payer: Self-pay | Admitting: Adult Health

## 2017-04-06 MED ORDER — ATORVASTATIN CALCIUM 20 MG PO TABS: 20.0000 mg | ORAL_TABLET | Freq: Every day | ORAL | 3 refills | Status: DC

## 2017-04-11 ENCOUNTER — Encounter: Payer: Self-pay | Admitting: Family Medicine

## 2017-05-14 ENCOUNTER — Other Ambulatory Visit: Payer: Self-pay | Admitting: Adult Health

## 2017-05-14 DIAGNOSIS — R1084 Generalized abdominal pain: Secondary | ICD-10-CM

## 2017-05-16 NOTE — Telephone Encounter (Signed)
Sent to the pharmacy by e-scribe. 

## 2017-07-19 ENCOUNTER — Other Ambulatory Visit: Payer: Self-pay | Admitting: Adult Health

## 2017-07-19 DIAGNOSIS — Z139 Encounter for screening, unspecified: Secondary | ICD-10-CM

## 2017-08-13 ENCOUNTER — Ambulatory Visit
Admission: RE | Admit: 2017-08-13 | Discharge: 2017-08-13 | Disposition: A | Discharge status: Home / Self Care | Payer: Medicare HMO | Source: Ambulatory Visit | Attending: Adult Health | Admitting: Adult Health

## 2017-08-13 DIAGNOSIS — Z1231 Encounter for screening mammogram for malignant neoplasm of breast: Secondary | ICD-10-CM | POA: Diagnosis not present

## 2017-08-13 DIAGNOSIS — Z139 Encounter for screening, unspecified: Secondary | ICD-10-CM

## 2017-10-12 DIAGNOSIS — R739 Hyperglycemia, unspecified: Secondary | ICD-10-CM | POA: Diagnosis not present

## 2017-10-12 DIAGNOSIS — S90822A Blister (nonthermal), left foot, initial encounter: Secondary | ICD-10-CM | POA: Diagnosis not present

## 2017-10-12 DIAGNOSIS — Z79899 Other long term (current) drug therapy: Secondary | ICD-10-CM | POA: Diagnosis not present

## 2017-10-18 DIAGNOSIS — S90829A Blister (nonthermal), unspecified foot, initial encounter: Secondary | ICD-10-CM | POA: Diagnosis not present

## 2017-10-18 DIAGNOSIS — M79672 Pain in left foot: Secondary | ICD-10-CM | POA: Diagnosis not present

## 2017-10-20 ENCOUNTER — Encounter: Payer: Self-pay | Admitting: Adult Health

## 2017-10-30 ENCOUNTER — Ambulatory Visit (INDEPENDENT_AMBULATORY_CARE_PROVIDER_SITE_OTHER): Payer: Medicare HMO | Admitting: Adult Health

## 2017-10-30 ENCOUNTER — Encounter: Payer: Self-pay | Admitting: Adult Health

## 2017-10-30 VITALS — BP 104/70 | Temp 98.1°F | Wt 193.0 lb

## 2017-10-30 DIAGNOSIS — R748 Abnormal levels of other serum enzymes: Secondary | ICD-10-CM

## 2017-10-30 DIAGNOSIS — R635 Abnormal weight gain: Secondary | ICD-10-CM

## 2017-10-30 DIAGNOSIS — E782 Mixed hyperlipidemia: Secondary | ICD-10-CM | POA: Diagnosis not present

## 2017-10-30 DIAGNOSIS — D649 Anemia, unspecified: Secondary | ICD-10-CM

## 2017-10-30 LAB — CBC WITH DIFFERENTIAL/PLATELET
BASOS PCT: 0.6 % (ref 0.0–3.0)
Basophils Absolute: 0 10*3/uL (ref 0.0–0.1)
Eosinophils Absolute: 0.1 10*3/uL (ref 0.0–0.7)
Eosinophils Relative: 1.4 % (ref 0.0–5.0)
HCT: 40.9 % (ref 36.0–46.0)
Hemoglobin: 13.9 g/dL (ref 12.0–15.0)
LYMPHS ABS: 2.4 10*3/uL (ref 0.7–4.0)
Lymphocytes Relative: 49.3 % — ABNORMAL HIGH (ref 12.0–46.0)
MCHC: 34 g/dL (ref 30.0–36.0)
MCV: 90.8 fl (ref 78.0–100.0)
MONO ABS: 0.5 10*3/uL (ref 0.1–1.0)
Monocytes Relative: 9.3 % (ref 3.0–12.0)
NEUTROS ABS: 1.9 10*3/uL (ref 1.4–7.7)
NEUTROS PCT: 39.4 % — AB (ref 43.0–77.0)
PLATELETS: 243 10*3/uL (ref 150.0–400.0)
RBC: 4.5 Mil/uL (ref 3.87–5.11)
RDW: 14 % (ref 11.5–15.5)
WBC: 4.9 10*3/uL (ref 4.0–10.5)

## 2017-10-30 LAB — HEPATIC FUNCTION PANEL
ALBUMIN: 4.6 g/dL (ref 3.5–5.2)
ALK PHOS: 118 U/L — AB (ref 39–117)
ALT: 43 U/L — ABNORMAL HIGH (ref 0–35)
AST: 37 U/L (ref 0–37)
BILIRUBIN DIRECT: 0.1 mg/dL (ref 0.0–0.3)
Total Bilirubin: 0.4 mg/dL (ref 0.2–1.2)
Total Protein: 7.2 g/dL (ref 6.0–8.3)

## 2017-10-30 LAB — LIPID PANEL
CHOLESTEROL: 170 mg/dL (ref 0–200)
HDL: 65.8 mg/dL (ref >39.00)
LDL Cholesterol: 77 mg/dL (ref 0–99)
NONHDL: 103.7
Total CHOL/HDL Ratio: 3
Triglycerides: 133 mg/dL (ref 0.0–149.0)
VLDL: 26.6 mg/dL (ref 0.0–40.0)

## 2017-10-30 LAB — VITAMIN D 25 HYDROXY (VIT D DEFICIENCY, FRACTURES): VITD: 41.24 ng/mL (ref 30.00–100.00)

## 2017-10-30 NOTE — Progress Notes (Signed)
Subjective:    Patient ID: Leslie Benson, female    DOB: [birth year], 57 y.o.   MRN: [id number]  HPI   57 year old female who  has a past medical history of Barrett esophagus, Breast nodule, Cervical arthritis (09/22/2013), Depression, Elevated liver enzymes, Fibromyalgia, Fibromyalgia, GERD (gastroesophageal reflux disease), H/O hiatal hernia, Headache(784.0), High cholesterol, Lymphocytic colitis, Migraines, Panic attack, Paraesophageal hiatal hernia, Spinal stenosis, and Thyroid disease.  She presents to the office today after being seen in at Urgent Care for a blister on the bottom of her foot. She reports that she had lab work drawn which showed that she was anemic and had some elevated liver enzymes. She was advised to follow up with her PCP.   She is also concerned for weight gain. She reports that she has been eating healthy and is riding a stationary bike. She exercises 30 minutes a day( feels as though she cannot go longer due to pain from fibromyalgia), almost ever day a week.  Despite her attempts she is been unable to lose weight and has been slowly gaining.  She does report obesity with her parents and sister.  She is interested in seeing a weight loss specialist to see if they can help with weight loss.  Wt Readings from Last 3 Encounters:  10/30/17 193 lb (87.5 kg)  04/05/17 188 lb (85.3 kg)  12/15/16 189 lb (85.7 kg)   Review of Systems See HPI   Past Medical History:  Diagnosis Date  . Barrett esophagus   . Breast nodule    benign  . Cervical arthritis 09/22/2013  . Depression   . Elevated liver enzymes   . Fibromyalgia   . Fibromyalgia   . GERD (gastroesophageal reflux disease)   . H/O hiatal hernia   . Headache(784.0)    migraines  . High cholesterol   . Lymphocytic colitis   . Migraines   . Panic attack   . Paraesophageal hiatal hernia    repaired 2009  . Spinal stenosis   . Thyroid disease     Social History   Socioeconomic History  . Marital  status: Married    Spouse name: Not on file  . Number of children: Not on file  . Years of education: Not on file  . Highest education level: Not on file  Occupational History  . Not on file  Social Needs  . Financial resource strain: Not on file  . Food insecurity:    Worry: Not on file    Inability: Not on file  . Transportation needs:    Medical: Not on file    Non-medical: Not on file  Tobacco Use  . Smoking status: Never Smoker  . Smokeless tobacco: Never Used  Substance and Sexual Activity  . Alcohol use: Yes    Alcohol/week: 0.0 oz    Comment: wine occassionally  . Drug use: No  . Sexual activity: Yes    Birth control/protection: None    Comment: n/a partial hysterectomy  Lifestyle  . Physical activity:    Days per week: Not on file    Minutes per session: Not on file  . Stress: Not on file  Relationships  . Social connections:    Talks on phone: Not on file    Gets together: Not on file    Attends religious service: Not on file    Active member of club or organization: Not on file    Attends meetings of clubs or organizations: Not on file  Relationship status: Not on file  . Intimate partner violence:    Fear of current or ex partner: Not on file    Emotionally abused: Not on file    Physically abused: Not on file    Forced sexual activity: Not on file  Other Topics Concern  . Not on file  Social History Narrative   Married    Lived in [place].  Moved to [place] in early 2000s   She is on disability for anxiety     Past Surgical History:  Procedure Laterality Date  . ANKLE SURGERY    . CHOLECYSTECTOMY  1980s?  . EUS N/A 11/19/2013   Procedure: ESOPHAGEAL ENDOSCOPIC ULTRASOUND (EUS) RADIAL;  Surgeon: Arta Silence, MD;  Location: [facility];  Service: Endoscopy;  Laterality: N/A;  . LAPAROSCOPIC NISSEN FUNDOPLICATION  42/68/3419  . LAPAROSCOPIC PARAESOPHAGEAL HERNIA REPAIR  03/05/2008   Dr Johney Maine  . TUBAL LIGATION  1990s?  Marland Kitchen VAGINAL  HYSTERECTOMY  03/04/2001    Family History  Problem Relation Age of Onset  . Alcohol abuse Unknown   . Colon cancer Unknown   . Elevated Lipids Unknown   . Heart disease Unknown     Allergies  Allergen Reactions  . Codeine Nausea And Vomiting  . Compazine [Prochlorperazine Edisylate]     "feels weird"  . Sulfa Antibiotics     Unknown reaction.    Current Outpatient Medications on File Prior to Visit  Medication Sig Dispense Refill  . ALPRAZolam (XANAX) 1 MG tablet Take 2 mg by mouth 3 (three) times daily as needed for anxiety (anxiety). .    . atorvastatin (LIPITOR) 20 MG tablet Take 1 tablet (20 mg total) by mouth daily. 90 tablet 3  . gabapentin (NEURONTIN) 600 MG tablet Take 1 tablet (600 mg total) by mouth 3 (three) times daily. (Patient taking differently: Take 1,200 mg by mouth 3 (three) times daily. Dr. Toy Care) 90 tablet 0  . levothyroxine (SYNTHROID, LEVOTHROID) 25 MCG tablet TAKE 1 TABLET ONE TIME DAILY BEFORE BREAKFAST 90 tablet 2  . omega-3 acid ethyl esters (LOVAZA) 1 G capsule Take 1 g by mouth 2 (two) times daily.    . pantoprazole (PROTONIX) 40 MG tablet TAKE 1 TABLET EVERY DAY 90 tablet 2  . QUEtiapine (SEROQUEL) 400 MG tablet Take 800 mg by mouth at bedtime.    . traMADol (ULTRAM) 50 MG tablet Take 2 tablets (100 mg total) by mouth every 6 (six) hours as needed for pain. (Patient taking differently: Take 100 mg by mouth every 6 (six) hours as needed for moderate pain. Dr. Toy Care) 30 tablet 0  . venlafaxine XR (EFFEXOR-XR) 75 MG 24 hr capsule Take 75 mg by mouth 3 (three) times daily.      No current facility-administered medications on file prior to visit.     BP 104/70   Temp 98.1 F (36.7 C) (Oral)   Wt 193 lb (87.5 kg)   BMI 33.13 kg/m       Objective:   Physical Exam  Constitutional: She is oriented to person, place, and time. She appears well-developed and well-nourished. No distress.  Cardiovascular: Normal rate, regular rhythm, normal heart sounds  and intact distal pulses. Exam reveals no friction rub.  No murmur heard. Pulmonary/Chest: Effort normal and breath sounds normal.  Musculoskeletal: Normal range of motion. She exhibits no edema or deformity.  Neurological: She is alert and oriented to person, place, and time.  Skin: Skin is warm and dry. She is not diaphoretic.  Psychiatric:  She has a normal mood and affect. Her behavior is normal. Judgment and thought content normal.  Nursing note and vitals reviewed.     Assessment & Plan:  1. Elevated liver enzymes -She has had slightly elevated alkaline phosphatase in the past.  Last was 6 months ago with a reading of 132.  Calcium level was normal at this time. - Will consider [name] of RUQ  - Hepatic function panel - CBC with Differential/Platelet - Iron, TIBC and Ferritin Panel - Lipid panel  2. Anemia, unspecified type - Iron, TIBC and Ferritin Panel - Vitamin D, 25-hydroxy  3. Weight gain -Viewed medications in detail.  She is on multiple medications that can cause weight gain.  Advised that she could talk with her psychiatrist about possibly changing some of these medications.  -TSH was normal in December-will not recheck today - Vitamin D, 25-hydroxy - Amb Ref to Medical Weight Management  4. Mixed hyperlipidemia - Was added on Lipitor in December for elevated cholesterol levels.  Since that time she is been working on diet.  Some of her elevation could be a result of psychiatric medications that she is currently taking - Lipid panel  [name], NP

## 2017-10-31 ENCOUNTER — Encounter: Payer: Self-pay | Admitting: Family Medicine

## 2017-10-31 LAB — IRON,TIBC AND FERRITIN PANEL
%SAT: 21 % (ref 16–45)
Ferritin: 23 ng/mL (ref 16–232)
Iron: 68 ug/dL (ref 45–160)
TIBC: 319 ug/dL (ref 250–450)

## 2017-11-02 DIAGNOSIS — S90829A Blister (nonthermal), unspecified foot, initial encounter: Secondary | ICD-10-CM | POA: Diagnosis not present

## 2017-11-02 DIAGNOSIS — M79672 Pain in left foot: Secondary | ICD-10-CM | POA: Diagnosis not present

## 2017-11-30 ENCOUNTER — Ambulatory Visit (INDEPENDENT_AMBULATORY_CARE_PROVIDER_SITE_OTHER): Payer: Medicare HMO | Admitting: Adult Health

## 2017-11-30 ENCOUNTER — Encounter: Payer: Self-pay | Admitting: Adult Health

## 2017-11-30 ENCOUNTER — Ambulatory Visit: Payer: Medicare HMO | Admitting: Adult Health

## 2017-11-30 VITALS — BP 90/60 | Temp 98.4°F | Wt 195.0 lb

## 2017-11-30 DIAGNOSIS — J020 Streptococcal pharyngitis: Secondary | ICD-10-CM

## 2017-11-30 LAB — POCT RAPID STREP A (OFFICE): RAPID STREP A SCREEN: POSITIVE — AB

## 2017-11-30 MED ORDER — PENICILLIN V POTASSIUM 500 MG PO TABS: 500.0000 mg | ORAL_TABLET | Freq: Three times a day (TID) | ORAL | 0 refills | Status: AC

## 2017-11-30 NOTE — Progress Notes (Signed)
Subjective:    Patient ID: Leslie Benson, female    DOB: 06-Nov-1960, 57 y.o.   MRN: [id number]  Sore Throat   This is a new problem. The current episode started today. Neither side of throat is experiencing more pain than the other. There has been no fever. Associated symptoms include coughing, a hoarse voice, swollen glands and trouble swallowing. Pertinent negatives include no abdominal pain, congestion, diarrhea, ear pain, plugged ear sensation, neck pain, shortness of breath, stridor or vomiting. She has had no exposure to strep or mono. She has tried nothing for the symptoms.   BP Readings from Last 3 Encounters:  11/30/17 90/60  10/30/17 104/70  04/05/17 110/80      Review of Systems  Constitutional: Positive for activity change, appetite change and fatigue. Negative for chills and fever.  HENT: Positive for hoarse voice, sore throat and trouble swallowing. Negative for congestion, ear pain, postnasal drip, rhinorrhea, sinus pressure and sinus pain.   Eyes: Negative.   Respiratory: Positive for cough. Negative for shortness of breath and stridor.   Cardiovascular: Negative.   Gastrointestinal: Negative for abdominal pain, diarrhea and vomiting.  Musculoskeletal: Negative for neck pain.  Neurological: Negative.    Past Medical History:  Diagnosis Date  . Barrett esophagus   . Breast nodule    benign  . Cervical arthritis 09/22/2013  . Depression   . Elevated liver enzymes   . Fibromyalgia   . Fibromyalgia   . GERD (gastroesophageal reflux disease)   . H/O hiatal hernia   . Headache(784.0)    migraines  . High cholesterol   . Lymphocytic colitis   . Migraines   . Panic attack   . Paraesophageal hiatal hernia    repaired 2009  . Spinal stenosis   . Thyroid disease     Social History   Socioeconomic History  . Marital status: Married    Spouse name: Not on file  . Number of children: Not on file  . Years of education: Not on file  . Highest education  level: Not on file  Occupational History  . Not on file  Social Needs  . Financial resource strain: Not on file  . Food insecurity:    Worry: Not on file    Inability: Not on file  . Transportation needs:    Medical: Not on file    Non-medical: Not on file  Tobacco Use  . Smoking status: Never Smoker  . Smokeless tobacco: Never Used  Substance and Sexual Activity  . Alcohol use: Yes    Alcohol/week: 0.0 standard drinks    Comment: wine occassionally  . Drug use: No  . Sexual activity: Yes    Birth control/protection: None    Comment: n/a partial hysterectomy  Lifestyle  . Physical activity:    Days per week: Not on file    Minutes per session: Not on file  . Stress: Not on file  Relationships  . Social connections:    Talks on phone: Not on file    Gets together: Not on file    Attends religious service: Not on file    Active member of club or organization: Not on file    Attends meetings of clubs or organizations: Not on file    Relationship status: Not on file  . Intimate partner violence:    Fear of current or ex partner: Not on file    Emotionally abused: Not on file    Physically abused: Not on file  Forced sexual activity: Not on file  Other Topics Concern  . Not on file  Social History Narrative   Married    Lived in [place].  Moved to [place] in early 2000s   She is on disability for anxiety     Past Surgical History:  Procedure Laterality Date  . ANKLE SURGERY    . CHOLECYSTECTOMY  1980s?  . EUS N/A 11/19/2013   Procedure: ESOPHAGEAL ENDOSCOPIC ULTRASOUND (EUS) RADIAL;  Surgeon: Arta Silence, MD;  Location: [facility];  Service: Endoscopy;  Laterality: N/A;  . LAPAROSCOPIC NISSEN FUNDOPLICATION  43/15/4008  . LAPAROSCOPIC PARAESOPHAGEAL HERNIA REPAIR  03/05/2008   Dr Johney Maine  . TUBAL LIGATION  1990s?  Marland Kitchen VAGINAL HYSTERECTOMY  03/04/2001    Family History  Problem Relation Age of Onset  . Alcohol abuse Unknown   . Colon cancer Unknown   .  Elevated Lipids Unknown   . Heart disease Unknown     Allergies  Allergen Reactions  . Codeine Nausea And Vomiting  . Compazine [Prochlorperazine Edisylate]     "feels weird"  . Sulfa Antibiotics     Unknown reaction.    Current Outpatient Medications on File Prior to Visit  Medication Sig Dispense Refill  . ALPRAZolam (XANAX) 1 MG tablet Take 2 mg by mouth 3 (three) times daily as needed for anxiety (anxiety). .    . atorvastatin (LIPITOR) 20 MG tablet Take 1 tablet (20 mg total) by mouth daily. (Patient taking differently: Take 10 mg by mouth daily. ) 90 tablet 3  . gabapentin (NEURONTIN) 600 MG tablet Take 1 tablet (600 mg total) by mouth 3 (three) times daily. (Patient taking differently: Take 1,200 mg by mouth 3 (three) times daily. Dr. Toy Care) 90 tablet 0  . levothyroxine (SYNTHROID, LEVOTHROID) 25 MCG tablet TAKE 1 TABLET ONE TIME DAILY BEFORE BREAKFAST 90 tablet 2  . omega-3 acid ethyl esters (LOVAZA) 1 G capsule Take 1 g by mouth 2 (two) times daily.    . pantoprazole (PROTONIX) 40 MG tablet TAKE 1 TABLET EVERY DAY 90 tablet 2  . QUEtiapine (SEROQUEL) 400 MG tablet Take 800 mg by mouth at bedtime.    . traMADol (ULTRAM) 50 MG tablet Take 2 tablets (100 mg total) by mouth every 6 (six) hours as needed for pain. (Patient taking differently: Take 100 mg by mouth every 6 (six) hours as needed for moderate pain. Dr. Toy Care) 30 tablet 0  . venlafaxine XR (EFFEXOR-XR) 75 MG 24 hr capsule Take 75 mg by mouth 3 (three) times daily.      No current facility-administered medications on file prior to visit.     BP 90/60   Temp 98.4 F (36.9 C)   Wt 195 lb (88.5 kg)   BMI 33.47 kg/m      Objective:   Physical Exam  Constitutional: She is oriented to person, place, and time. She appears well-developed and well-nourished.  Non-toxic appearance. She appears ill.  HENT:  Mouth/Throat: Uvula is midline. No oral lesions. No uvula swelling. Posterior oropharyngeal erythema present. No  oropharyngeal exudate, posterior oropharyngeal edema or tonsillar abscesses. Tonsils are 0 on the right. Tonsils are 0 on the left. No tonsillar exudate.  Eyes: Pupils are equal, round, and reactive to light. EOM are normal.  Neck: Normal range of motion. Neck supple.  Cardiovascular: Normal rate, regular rhythm, normal heart sounds and intact distal pulses. Exam reveals no gallop and no friction rub.  No murmur heard. Pulmonary/Chest: Effort normal and breath sounds normal. No  stridor. No respiratory distress. She has no wheezes. She has no rales.  Lymphadenopathy:       Head (right side): Submandibular, tonsillar and preauricular adenopathy present.       Head (left side): Submandibular, tonsillar and preauricular adenopathy present.  Neurological: She is alert and oriented to person, place, and time.  Skin: Skin is warm and dry.  Psychiatric: She has a normal mood and affect. Her behavior is normal.  Nursing note and vitals reviewed.     Assessment & Plan:  1. Strep throat  - POC Rapid Strep A- positive  - penicillin v potassium (VEETID) 500 MG tablet; Take 1 tablet (500 mg total) by mouth 3 (three) times daily for 10 days.  Dispense: 30 tablet; Refill: 0 - Follow up as needed   [name], NP

## 2017-12-19 ENCOUNTER — Other Ambulatory Visit: Payer: Medicare HMO

## 2017-12-25 ENCOUNTER — Telehealth: Payer: Self-pay | Admitting: Family Medicine

## 2017-12-25 NOTE — Telephone Encounter (Signed)
Copied from [facility] [id number]. Topic: General - Other >> Dec 25, 2017  4:28 PM Ivar Drape wrote: Reason for CRM:   Patient wants to know if the provider wants her to come in to get her enzynes checked

## 2017-12-25 NOTE — Telephone Encounter (Signed)
Pt notified she is due in Oct for recheck.

## 2017-12-26 ENCOUNTER — Other Ambulatory Visit: Payer: Self-pay | Admitting: Adult Health

## 2017-12-27 NOTE — Telephone Encounter (Signed)
Sent to the pharmacy by e-scribe. 

## 2018-01-08 ENCOUNTER — Encounter (INDEPENDENT_AMBULATORY_CARE_PROVIDER_SITE_OTHER): Payer: Self-pay

## 2018-01-16 ENCOUNTER — Other Ambulatory Visit: Payer: Self-pay | Admitting: Adult Health

## 2018-01-16 DIAGNOSIS — R1084 Generalized abdominal pain: Secondary | ICD-10-CM

## 2018-01-23 ENCOUNTER — Ambulatory Visit (INDEPENDENT_AMBULATORY_CARE_PROVIDER_SITE_OTHER): Payer: Medicare HMO | Admitting: Bariatrics

## 2018-01-23 ENCOUNTER — Encounter (INDEPENDENT_AMBULATORY_CARE_PROVIDER_SITE_OTHER): Payer: Self-pay | Admitting: Bariatrics

## 2018-01-23 VITALS — BP 108/73 | HR 76 | Temp 97.8°F | Ht 64.0 in | Wt 196.0 lb

## 2018-01-23 DIAGNOSIS — Z8349 Family history of other endocrine, nutritional and metabolic diseases: Secondary | ICD-10-CM

## 2018-01-23 DIAGNOSIS — E038 Other specified hypothyroidism: Secondary | ICD-10-CM

## 2018-01-23 DIAGNOSIS — R5383 Other fatigue: Secondary | ICD-10-CM

## 2018-01-23 DIAGNOSIS — E669 Obesity, unspecified: Secondary | ICD-10-CM | POA: Diagnosis not present

## 2018-01-23 DIAGNOSIS — Z9189 Other specified personal risk factors, not elsewhere classified: Secondary | ICD-10-CM | POA: Diagnosis not present

## 2018-01-23 DIAGNOSIS — Z0289 Encounter for other administrative examinations: Secondary | ICD-10-CM

## 2018-01-23 DIAGNOSIS — F3289 Other specified depressive episodes: Secondary | ICD-10-CM

## 2018-01-23 DIAGNOSIS — E7849 Other hyperlipidemia: Secondary | ICD-10-CM | POA: Diagnosis not present

## 2018-01-23 DIAGNOSIS — R0602 Shortness of breath: Secondary | ICD-10-CM

## 2018-01-23 DIAGNOSIS — Z6833 Body mass index (BMI) 33.0-33.9, adult: Secondary | ICD-10-CM | POA: Diagnosis not present

## 2018-01-23 DIAGNOSIS — Z6830 Body mass index (BMI) 30.0-30.9, adult: Secondary | ICD-10-CM

## 2018-01-23 NOTE — Progress Notes (Signed)
.  Office: [phone]  /  Fax: [phone]   HPI:   Chief Complaint: OBESITY  Leslie Benson (MR# [id number]) is a 57 y.o. female who presents on 01/23/2018 for obesity evaluation and treatment. Current BMI is Body mass index is 33.64 kg/m.Marland Kitchen Leslie Benson has struggled with obesity for years and has been unsuccessful in either losing weight or maintaining long term weight loss. Leslie Benson craves chocolate and some sweets. She is a "picky eater". Leslie Benson snacks after dinner and she sometimes eats in the middle of the night. Leslie Benson attended our information session and states she is currently in the action stage of change and ready to dedicate time achieving and maintaining a healthier weight.  Leslie Benson states her family eats meals together she struggles with family and or coworkers weight loss sabotage her desired weight loss is 55 lbs she started gaining weight last year because of her thyroid her heaviest weight ever was 210 lbs. she is a picky eater and doesn't like to eat healthier foods  she has significant food cravings issues  she snacks frequently in the evenings she wakes up frequently in the middle of the night to eat she skips meals frequently she is frequently drinking liquids with calories she has binge eating behaviors she struggles with emotional eating    Fatigue Leslie Benson feels her energy is lower than it should be. This has worsened with weight gain and has not worsened recently. Leslie Benson admits to daytime somnolence and admits to waking up still tired. Leslie Benson has "trouble sleeping" and she goes to bed late. She naps during the day. Patient is at risk for obstructive sleep apnea and she does not use CPAP. Patent has a history of symptoms of daytime fatigue, morning fatigue and morning headache. Patient generally gets 6 hours of sleep per night, and states they generally have restless sleep. Snoring is present. Apneic episodes are not present. Epworth Sleepiness Score is  8  Dyspnea on exertion Leslie Benson notes increasing shortness of breath with exercising and seems to be worsening over time with weight gain. She notes getting out of breath sooner with activity than she used to. This has not gotten worse recently. Leslie Benson denies orthopnea.  Hyperlipidemia Leslie Benson has hyperlipidemia and she is taking atorvastatin. Leslie Benson is currently well controlled, see 10/30/17 labs. She is attempting to improve her cholesterol levels with intensive lifestyle modification including a low saturated fat diet, exercise and weight loss. She denies any muscle weakness.  Hypothyroidism Leslie Benson has a diagnosis of hypothyroidism. She is on levothyroxine. She admits to hot or cold intolerance and fatigue.  History of vitamin B12 deficiency Leslie Benson has a history of B12 insufficiency. Leslie Benson is not a vegetarian and does not know have a previous diagnosis of pernicious anemia. She does not have a history of weight loss surgery. Leslie Benson denies muscle weakness, nausea or vomiting. She denies paresthesias.  Depression with emotional eating behaviors Leslie Benson is struggling with emotional eating and using food for comfort to the extent that it is negatively impacting her health. She often snacks when she is not hungry. [name] sometimes feels she is out of control and then feels guilty that she made poor food choices. She is attempting to work on behavior modification techniques to help reduce her emotional eating. Leslie Benson sees a psychiatrist and she is taking psychiatric medications. She feels that she is controlled with seeing her psychiatrist and taking her medications. Leslie Benson has trauma from childhood. She shows no sign of suicidal or homicidal ideations.  Depression Screen Leslie Benson's Food  and Mood (modified PHQ-9) score was  Depression screen PHQ 2/9 01/23/2018  Decreased Interest 3  Down, Depressed, Hopeless 3  PHQ - 2 Score 6  Altered sleeping 2  Tired, decreased energy 2  Change in  appetite 1  Feeling bad or failure about yourself  2  Trouble concentrating 3  Moving slowly or fidgety/restless 0  Suicidal thoughts 3  PHQ-9 Score 19  Difficult doing work/chores Very difficult    ALLERGIES: Allergies  Allergen Reactions  . Codeine Nausea And Vomiting  . Compazine [Prochlorperazine Edisylate]     "feels weird"  . Sulfa Antibiotics     Unknown reaction.    MEDICATIONS: Current Outpatient Medications on File Prior to Visit  Medication Sig Dispense Refill  . ALPRAZolam (XANAX) 1 MG tablet Take 2 mg by mouth 3 (three) times daily as needed for anxiety (anxiety). .    . atorvastatin (LIPITOR) 20 MG tablet TAKE 1 TABLET (20 MG TOTAL) BY MOUTH DAILY. 90 tablet 0  . gabapentin (NEURONTIN) 600 MG tablet Take 1 tablet (600 mg total) by mouth 3 (three) times daily. (Patient taking differently: Take 1,200 mg by mouth 3 (three) times daily. Dr. Toy Care) 90 tablet 0  . levothyroxine (SYNTHROID, LEVOTHROID) 25 MCG tablet TAKE 1 TABLET ONE TIME DAILY BEFORE BREAKFAST 90 tablet 0  . omega-3 acid ethyl esters (LOVAZA) 1 G capsule Take 1 g by mouth 2 (two) times daily.    . pantoprazole (PROTONIX) 40 MG tablet TAKE 1 TABLET EVERY DAY 90 tablet 0  . QUEtiapine (SEROQUEL) 400 MG tablet Take 800 mg by mouth at bedtime.    . traMADol (ULTRAM) 50 MG tablet Take 2 tablets (100 mg total) by mouth every 6 (six) hours as needed for pain. (Patient taking differently: Take 100 mg by mouth every 6 (six) hours as needed for moderate pain. Dr. Toy Care) 30 tablet 0  . venlafaxine XR (EFFEXOR-XR) 75 MG 24 hr capsule Take 75 mg by mouth 3 (three) times daily.      No current facility-administered medications on file prior to visit.     PAST MEDICAL HISTORY: Past Medical History:  Diagnosis Date  . ADHD   . Anxiety   . Barrett esophagus   . Breast nodule    benign  . Cervical arthritis 09/22/2013  . Depression   . Elevated liver enzymes   . Fatty liver   . Fibromyalgia   . Fibromyalgia   .  GERD (gastroesophageal reflux disease)   . H/O hiatal hernia   . Headache(784.0)    migraines  . High cholesterol   . Lymphocytic colitis   . Migraines   . Pain   . Panic attack   . Paraesophageal hiatal hernia    repaired 2009  . Spinal stenosis   . Thyroid disease   . Thyroid disease     PAST SURGICAL HISTORY: Past Surgical History:  Procedure Laterality Date  . ANKLE SURGERY    . CHOLECYSTECTOMY  1980s?  . EUS N/A 11/19/2013   Procedure: ESOPHAGEAL ENDOSCOPIC ULTRASOUND (EUS) RADIAL;  Surgeon: Arta Silence, MD;  Location: [facility];  Service: Endoscopy;  Laterality: N/A;  . LAPAROSCOPIC NISSEN FUNDOPLICATION  24/12/7351  . LAPAROSCOPIC PARAESOPHAGEAL HERNIA REPAIR  03/05/2008   Dr Johney Maine  . TUBAL LIGATION  1990s?  Marland Kitchen VAGINAL HYSTERECTOMY  03/04/2001    SOCIAL HISTORY: Social History   Tobacco Use  . Smoking status: Never Smoker  . Smokeless tobacco: Never Used  Substance Use Topics  . Alcohol use:  Yes    Alcohol/week: 0.0 standard drinks    Comment: wine occassionally  . Drug use: No    FAMILY HISTORY: Family History  Problem Relation Age of Onset  . Alcohol abuse Unknown   . Colon cancer Unknown   . Elevated Lipids Unknown   . Heart disease Unknown   . Anxiety disorder Mother   . Obesity Mother   . High Cholesterol Mother   . Thyroid cancer Mother   . Depression Mother   . High Cholesterol Father   . Heart disease Father   . Alcoholism Father     ROS: Review of Systems  Constitutional: Positive for malaise/fatigue.  HENT: Positive for congestion (nasal stuffiness) and sinus pain.        + Dry Mouth + Hoarseness  Eyes:       + Vision Changes + Wear Glasses or Contacts  Respiratory: Positive for shortness of breath (with activity).   Cardiovascular: Negative for orthopnea.       Calf/Leg Pain with walking  Gastrointestinal: Positive for constipation, diarrhea and heartburn. Negative for nausea and vomiting.       + Swallowing Difficulty   Musculoskeletal: Positive for back pain.       + Neck Stiffness + Muscle or Joint Pain + Muscle Stiffness Negative for muscle weakness  Neurological: Positive for weakness and headaches.       Negative for paresthesias  Endo/Heme/Allergies: Positive for polydipsia. Bruises/bleeds easily (bruising).       + Hot or Cold Intolerance + Polyphagia  Psychiatric/Behavioral: Positive for depression. Negative for suicidal ideas. The patient is nervous/anxious (nervousness) and has insomnia.        + Stress     PHYSICAL EXAM: Blood pressure 108/73, pulse 76, temperature 97.8 F (36.6 C), temperature source Oral, height 5\' 4"  (1.626 m), weight 196 lb (88.9 kg), SpO2 97 %. Body mass index is 33.64 kg/m. Physical Exam  Constitutional: She is oriented to person, place, and time. She appears well-developed and well-nourished.  HENT:  Head: Normocephalic and atraumatic.  Nose: Nose normal.  Mallanpati = 2  Eyes: EOM are normal. No scleral icterus.  Neck: Normal range of motion. Neck supple. No thyromegaly present.  Cardiovascular: Normal rate and regular rhythm.  Pulmonary/Chest: Effort normal. No respiratory distress.  Abdominal: Soft. There is no tenderness.  + Obesity  Musculoskeletal: Normal range of motion. She exhibits edema (trace edema bilateral lower extremities).  Range of Motion normal in all 4 extremities  Neurological: She is alert and oriented to person, place, and time. Coordination normal.  Skin: Skin is warm and dry.  Psychiatric: She has a normal mood and affect. Her behavior is normal.  Vitals reviewed.   RECENT LABS AND TESTS: BMET    Component Value Date/Time   NA 140 04/05/2017 1140   K 4.0 04/05/2017 1140   CL 102 04/05/2017 1140   CO2 30 04/05/2017 1140   GLUCOSE 96 04/05/2017 1140   BUN 10 04/05/2017 1140   CREATININE 0.98 04/05/2017 1140   CALCIUM 9.4 04/05/2017 1140   GFRNONAA >60 09/22/2015 2321   GFRAA >60 09/22/2015 2321   Lab Results  Component  Value Date   HGBA1C 5.5 04/05/2017   No results found for: INSULIN CBC    Component Value Date/Time   WBC 4.9 10/30/2017 1514   RBC 4.50 10/30/2017 1514   HGB 13.9 10/30/2017 1514   HCT 40.9 10/30/2017 1514   PLT 243.0 10/30/2017 1514   MCV 90.8 10/30/2017 1514  MCH 31.3 09/22/2015 2321   MCHC 34.0 10/30/2017 1514   RDW 14.0 10/30/2017 1514   LYMPHSABS 2.4 10/30/2017 1514   MONOABS 0.5 10/30/2017 1514   EOSABS 0.1 10/30/2017 1514   BASOSABS 0.0 10/30/2017 1514   Iron/TIBC/Ferritin/ %Sat    Component Value Date/Time   IRON 68 10/30/2017 1514   TIBC 319 10/30/2017 1514   FERRITIN 23 10/30/2017 1514   IRONPCTSAT 21 10/30/2017 1514   Lipid Panel     Component Value Date/Time   CHOL 170 10/30/2017 1514   TRIG 133.0 10/30/2017 1514   HDL 65.80 10/30/2017 1514   CHOLHDL 3 10/30/2017 1514   VLDL 26.6 10/30/2017 1514   LDLCALC 77 10/30/2017 1514   LDLDIRECT 168.0 04/05/2017 1140   Hepatic Function Panel     Component Value Date/Time   PROT 7.2 10/30/2017 1514   ALBUMIN 4.6 10/30/2017 1514   AST 37 10/30/2017 1514   ALT 43 (H) 10/30/2017 1514   ALKPHOS 118 (H) 10/30/2017 1514   BILITOT 0.4 10/30/2017 1514   BILIDIR 0.1 10/30/2017 1514   IBILI NOT CALCULATED 09/21/2013 2215      Component Value Date/Time   TSH 2.05 04/05/2017 1140   Vitamin D Results for NELLENE, COURTOIS (MRN [id number]) as of 01/23/2018 16:02  Ref. Range 10/30/2017 15:14  VITD Latest Ref Range: 30.00 - 100.00 ng/mL 41.24    ECG  shows NSR with a rate of 79 BPM INDIRECT CALORIMETER done today shows a VO2 of 207 and a REE of 1439. Her calculated basal metabolic rate is 6546 thus her basal metabolic rate is worse than expected.    ASSESSMENT AND PLAN: Other fatigue - Plan: EKG 12-Lead, Comprehensive metabolic panel, Hemoglobin A1c, Insulin, random, Vitamin B12, Folate  Shortness of breath on exertion  Other hyperlipidemia  Other specified hypothyroidism - Plan: T3, T4, free, TSH  Family  history of B12 deficiency  Other depression - with emotional eating  At risk for heart disease  Class 1 obesity with serious comorbidity and body mass index (BMI) of 33.0 to 33.9 in adult, unspecified obesity type  PLAN:  Fatigue Kalika was informed that her fatigue may be related to obesity, depression or many other causes. Labs will be ordered, and in the meanwhile Madisyn has agreed to work on diet, exercise and weight loss to help with fatigue. Proper sleep hygiene was discussed including the need for 7-8 hours of quality sleep each night. A sleep study was not ordered based on symptoms and Epworth score.  Dyspnea on exertion Ayelet's shortness of breath appears to be obesity related and exercise induced. She has agreed to work on weight loss and gradually increase exercise to treat her exercise induced shortness of breath. If Edna follows our instructions and loses weight without improvement of her shortness of breath, we will plan to refer to pulmonology. We will monitor this condition regularly. Paulina agrees to this plan.  Hyperlipidemia Fayette was informed of the American Heart Association Guidelines emphasizing intensive lifestyle modifications as the first line treatment for hyperlipidemia. We discussed many lifestyle modifications today in depth, and Marquelle will work on decreasing carbohydrates, saturated fats such as fatty red meat, butter and many fried foods. She will also increase vegetables and lean protein in her diet and continue to work on exercise and weight loss efforts. Virna will continue atorvastatin and follow up as directed.  History of vitamin B12 deficiency Alis will work on increasing B12 rich foods in her diet. We will check vitamin B12  level today and Tanelle will follow up with our clinic in 2 weeks.  Hypothyroidism Sabina was informed of the importance of good thyroid control to help with weight loss efforts. She was also informed that  supertheraputic thyroid levels are dangerous and will not improve weight loss results. Kaicee will continue levothyroxine and we will do a thyroid panel and she will follow up as directed.  Depression with Emotional Eating Behaviors We discussed behavior modification techniques today to help Quetzalli deal with her emotional eating and depression. We will refer to Dr. Mallie Mussel, our bariatric psychologist (limited visits) for CBT for eating.  Depression Screen Mackenize had a strongly positive depression screening. Depression is commonly associated with obesity and often results in emotional eating behaviors. We will monitor this closely and work on CBT to help improve the non-hunger eating patterns. Referral to Psychology may be required if no improvement is seen as she continues in our clinic.  Obesity Angalena is currently in the action stage of change and her goal is to continue with weight loss efforts She has agreed to follow the Category 2 plan Marchele has been instructed to work up to a goal of 150 minutes of combined cardio and strengthening exercise per week for weight loss and overall health benefits. We discussed the following Behavioral Modification Strategies today: no skipping meals, increase H2O intake, increasing lean protein intake, decreasing simple carbohydrates , increasing vegetables, decrease eating out, work on meal planning and easy cooking plans and ways to avoid night time snacking  Shakoya has agreed to follow up with our clinic in 2 weeks. She was informed of the importance of frequent follow up visits to maximize her success with intensive lifestyle modifications for her multiple health conditions. She was informed we would discuss her lab results at her next visit unless there is a critical issue that needs to be addressed sooner. Shaquavia agreed to keep her next visit at the agreed upon time to discuss these results.    OBESITY BEHAVIORAL INTERVENTION VISIT  Today's visit  was # 1   Starting weight: 196 lbs Starting date: 01/23/18 Today's weight : 196 lbs Today's date: 01/23/2018 Total lbs lost to date: 0 At least 15 minutes were spent on discussing the following behavioral intervention visit.   ASK: We discussed the diagnosis of obesity with [name] today and Mikhaila agreed to give [name] permission to discuss obesity behavioral modification therapy today.  ASSESS: Drea has the diagnosis of obesity and her BMI today is 33.63 Katharina is in the action stage of change   ADVISE: Rin was educated on the multiple health risks of obesity as well as the benefit of weight loss to improve her health. She was advised of the need for long term treatment and the importance of lifestyle modifications to improve her current health and to decrease her risk of future health problems.  AGREE: Multiple dietary modification options and treatment options were discussed and  Rosaisela agreed to follow the recommendations documented in the above note.  ARRANGE: Colin was educated on the importance of frequent visits to treat obesity as outlined per CMS and USPSTF guidelines and agreed to schedule her next follow up appointment today.   Corey Skains, am acting as Location manager for [employer]. Owens Shark, DO  I have reviewed the above documentation for accuracy and completeness, and I agree with the above. -Jearld Lesch, DO

## 2018-01-24 ENCOUNTER — Encounter: Payer: Self-pay | Admitting: Adult Health

## 2018-01-24 ENCOUNTER — Ambulatory Visit (INDEPENDENT_AMBULATORY_CARE_PROVIDER_SITE_OTHER): Payer: Medicare HMO | Admitting: Adult Health

## 2018-01-24 VITALS — BP 110/60 | Temp 98.2°F | Wt 197.6 lb

## 2018-01-24 DIAGNOSIS — R55 Syncope and collapse: Secondary | ICD-10-CM | POA: Diagnosis not present

## 2018-01-24 LAB — COMPREHENSIVE METABOLIC PANEL
ALT: 24 IU/L (ref 0–32)
AST: 20 IU/L (ref 0–40)
Albumin/Globulin Ratio: 1.9 (ref 1.2–2.2)
Albumin: 4.5 g/dL (ref 3.5–5.5)
Alkaline Phosphatase: 116 IU/L (ref 39–117)
BUN/Creatinine Ratio: 10 (ref 9–23)
BUN: 11 mg/dL (ref 6–24)
Bilirubin Total: 0.3 mg/dL (ref 0.0–1.2)
CALCIUM: 9.5 mg/dL (ref 8.7–10.2)
CO2: 26 mmol/L (ref 20–29)
Chloride: 98 mmol/L (ref 96–106)
Creatinine, Ser: 1.07 mg/dL — ABNORMAL HIGH (ref 0.57–1.00)
GFR calc Af Amer: 66 mL/min/{1.73_m2} (ref >59)
GFR, EST NON AFRICAN AMERICAN: 57 mL/min/{1.73_m2} — AB (ref >59)
GLUCOSE: 98 mg/dL (ref 65–99)
Globulin, Total: 2.4 g/dL (ref 1.5–4.5)
Potassium: 4.1 mmol/L (ref 3.5–5.2)
Sodium: 141 mmol/L (ref 134–144)
Total Protein: 6.9 g/dL (ref 6.0–8.5)

## 2018-01-24 LAB — T4, FREE: FREE T4: 0.83 ng/dL (ref 0.82–1.77)

## 2018-01-24 LAB — HEMOGLOBIN A1C
ESTIMATED AVERAGE GLUCOSE: 111 mg/dL
Hgb A1c MFr Bld: 5.5 % (ref 4.8–5.6)

## 2018-01-24 LAB — TSH: TSH: 1.53 u[IU]/mL (ref 0.450–4.500)

## 2018-01-24 LAB — INSULIN, RANDOM: INSULIN: 10.4 u[IU]/mL (ref 2.6–24.9)

## 2018-01-24 LAB — FOLATE

## 2018-01-24 LAB — VITAMIN B12: Vitamin B-12: 742 pg/mL (ref 232–1245)

## 2018-01-24 LAB — T3: T3 TOTAL: 70 ng/dL — AB (ref 71–180)

## 2018-01-24 NOTE — Progress Notes (Signed)
Subjective:    Patient ID: Leslie Benson, female    DOB: 09/28/1960, 57 y.o.   MRN: [id number]  HPI 57 year old female who  has a past medical history of ADHD, Anxiety, Barrett esophagus, Breast nodule, Cervical arthritis (09/22/2013), Depression, Elevated liver enzymes, Fatty liver, Fibromyalgia, Fibromyalgia, GERD (gastroesophageal reflux disease), H/O hiatal hernia, Headache(784.0), High cholesterol, Lymphocytic colitis, Migraines, Pain, Panic attack, Paraesophageal hiatal hernia, Spinal stenosis, Thyroid disease, and Thyroid disease.  She presents to the office today for an acute issue of left-sided facial pain.  She reports that late last week her husband found her passed out on the floor the bathroom.  She remembers getting up in the early morning to use the restroom, after using the restroom next thing she remembers is being woken up on the ground.  She does not think she hit her face on the sink but does believe she hit it on the bathroom floor.  She had some bruising but this slowly started to resolve so as the pain around her left orbit.  She denies any blurred vision.  Her biggest complaint is that of feeling "a little foggy headed".  Has not had any syncopal episodes since.  He has not endorsed any chest pain, shortness of breath or headaches  An EKG done at the weight loss center yesterday which showed this rhythm with a right bundle branch block, this is consistent with previous EKG from 2016.  She also had labs done at the weight loss clinic yesterday were unremarkable.   Review of Systems See HPI   Past Medical History:  Diagnosis Date  . ADHD   . Anxiety   . Barrett esophagus   . Breast nodule    benign  . Cervical arthritis 09/22/2013  . Depression   . Elevated liver enzymes   . Fatty liver   . Fibromyalgia   . Fibromyalgia   . GERD (gastroesophageal reflux disease)   . H/O hiatal hernia   . Headache(784.0)    migraines  . High cholesterol   . Lymphocytic colitis    . Migraines   . Pain   . Panic attack   . Paraesophageal hiatal hernia    repaired 2009  . Spinal stenosis   . Thyroid disease   . Thyroid disease     Social History   Socioeconomic History  . Marital status: Married    Spouse name: Abe People  . Number of children: Not on file  . Years of education: Not on file  . Highest education level: Not on file  Occupational History  . Occupation: disabled  Social Needs  . Financial resource strain: Not on file  . Food insecurity:    Worry: Not on file    Inability: Not on file  . Transportation needs:    Medical: Not on file    Non-medical: Not on file  Tobacco Use  . Smoking status: Never Smoker  . Smokeless tobacco: Never Used  Substance and Sexual Activity  . Alcohol use: Yes    Alcohol/week: 0.0 standard drinks    Comment: wine occassionally  . Drug use: No  . Sexual activity: Yes    Birth control/protection: None    Comment: n/a partial hysterectomy  Lifestyle  . Physical activity:    Days per week: Not on file    Minutes per session: Not on file  . Stress: Not on file  Relationships  . Social connections:    Talks on phone: Not on file  Gets together: Not on file    Attends religious service: Not on file    Active member of club or organization: Not on file    Attends meetings of clubs or organizations: Not on file    Relationship status: Not on file  . Intimate partner violence:    Fear of current or ex partner: Not on file    Emotionally abused: Not on file    Physically abused: Not on file    Forced sexual activity: Not on file  Other Topics Concern  . Not on file  Social History Narrative   Married    Lived in [place].  Moved to [place] in early 2000s   She is on disability for anxiety     Past Surgical History:  Procedure Laterality Date  . ANKLE SURGERY    . CHOLECYSTECTOMY  1980s?  . EUS N/A 11/19/2013   Procedure: ESOPHAGEAL ENDOSCOPIC ULTRASOUND (EUS) RADIAL;  Surgeon: Arta Silence, MD;   Location: [facility];  Service: Endoscopy;  Laterality: N/A;  . LAPAROSCOPIC NISSEN FUNDOPLICATION  [id number]  . LAPAROSCOPIC PARAESOPHAGEAL HERNIA REPAIR  03/05/2008   Dr Johney Maine  . TUBAL LIGATION  1990s?  Marland Kitchen VAGINAL HYSTERECTOMY  03/04/2001    Family History  Problem Relation Age of Onset  . Alcohol abuse Unknown   . Colon cancer Unknown   . Elevated Lipids Unknown   . Heart disease Unknown   . Anxiety disorder Mother   . Obesity Mother   . High Cholesterol Mother   . Thyroid cancer Mother   . Depression Mother   . High Cholesterol Father   . Heart disease Father   . Alcoholism Father     Allergies  Allergen Reactions  . Codeine Nausea And Vomiting  . Compazine [Prochlorperazine Edisylate]     "feels weird"  . Sulfa Antibiotics     Unknown reaction.    Current Outpatient Medications on File Prior to Visit  Medication Sig Dispense Refill  . ALPRAZolam (XANAX) 1 MG tablet Take 2 mg by mouth 3 (three) times daily as needed for anxiety (anxiety). .    . atorvastatin (LIPITOR) 20 MG tablet TAKE 1 TABLET (20 MG TOTAL) BY MOUTH DAILY. 90 tablet 0  . gabapentin (NEURONTIN) 600 MG tablet Take 1 tablet (600 mg total) by mouth 3 (three) times daily. (Patient taking differently: Take 1,200 mg by mouth 3 (three) times daily. Dr. Toy Care) 90 tablet 0  . levothyroxine (SYNTHROID, LEVOTHROID) 25 MCG tablet TAKE 1 TABLET ONE TIME DAILY BEFORE BREAKFAST 90 tablet 0  . omega-3 acid ethyl esters (LOVAZA) 1 G capsule Take 1 g by mouth 2 (two) times daily.    . pantoprazole (PROTONIX) 40 MG tablet TAKE 1 TABLET EVERY DAY 90 tablet 0  . QUEtiapine (SEROQUEL) 400 MG tablet Take 800 mg by mouth at bedtime.    . traMADol (ULTRAM) 50 MG tablet Take 2 tablets (100 mg total) by mouth every 6 (six) hours as needed for pain. (Patient taking differently: Take 100 mg by mouth every 6 (six) hours as needed for moderate pain. Dr. Toy Care) 30 tablet 0  . venlafaxine XR (EFFEXOR-XR) 75 MG 24 hr capsule Take 75 mg  by mouth 3 (three) times daily.      No current facility-administered medications on file prior to visit.     BP 110/60 (BP Location: Left Arm, Patient Position: Sitting, Cuff Size: Large)   Temp 98.2 F (36.8 C) (Oral)   Wt 197 lb 9.6 oz (89.6 kg)  BMI 33.92 kg/m       Objective:   Physical Exam  Constitutional: She is oriented to person, place, and time. She appears well-developed and well-nourished. No distress.  HENT:  Head: Normocephalic. Head is with contusion. Head is without raccoon's eyes, without Battle's sign, without laceration and without left periorbital erythema.  Right Ear: Hearing, tympanic membrane, external ear and ear canal normal. No hemotympanum.  Left Ear: Hearing, tympanic membrane, external ear and ear canal normal. No hemotympanum.  Nose: Nose normal. No nasal deformity, septal deviation or nasal septal hematoma. No epistaxis.  Eyes: Pupils are equal, round, and reactive to light. Conjunctivae and EOM are normal. Right eye exhibits no discharge. Left eye exhibits no discharge. No scleral icterus.  Neck: Normal range of motion. Neck supple.  Cardiovascular: Normal rate, regular rhythm, normal heart sounds and intact distal pulses.  Pulmonary/Chest: Effort normal and breath sounds normal.  Neurological: She is alert and oriented to person, place, and time.  Skin: Skin is warm and dry. She is not diaphoretic.  Pain or bruising noted around left orbit .   Psychiatric: She has a normal mood and affect. Her behavior is normal. Judgment and thought content normal.  Nursing note and vitals reviewed.     Assessment & Plan:  1. Vasovagal syncope -Sounds like she had a vasovagal episode.  She may have a minor concussion.  Advised her to not wait a week after an event like this happens.  Try to keep stimulation to a minimum.  She can take Tylenol as needed for discomfort.  Advised to follow-up with any red flags  [name], NP

## 2018-01-29 ENCOUNTER — Ambulatory Visit (INDEPENDENT_AMBULATORY_CARE_PROVIDER_SITE_OTHER): Payer: Medicare HMO | Admitting: Bariatrics

## 2018-01-29 VITALS — BP 107/76 | HR 94 | Temp 97.8°F | Ht 60.0 in | Wt 191.0 lb

## 2018-01-29 DIAGNOSIS — Z9189 Other specified personal risk factors, not elsewhere classified: Secondary | ICD-10-CM

## 2018-01-29 DIAGNOSIS — E8881 Metabolic syndrome: Secondary | ICD-10-CM

## 2018-01-29 DIAGNOSIS — F3289 Other specified depressive episodes: Secondary | ICD-10-CM | POA: Diagnosis not present

## 2018-01-29 DIAGNOSIS — Z8639 Personal history of other endocrine, nutritional and metabolic disease: Secondary | ICD-10-CM

## 2018-01-29 DIAGNOSIS — Z6832 Body mass index (BMI) 32.0-32.9, adult: Secondary | ICD-10-CM | POA: Diagnosis not present

## 2018-01-29 DIAGNOSIS — E669 Obesity, unspecified: Secondary | ICD-10-CM | POA: Diagnosis not present

## 2018-01-29 DIAGNOSIS — E7849 Other hyperlipidemia: Secondary | ICD-10-CM | POA: Diagnosis not present

## 2018-01-30 NOTE — Progress Notes (Signed)
Office: [phone]  /  Fax: [phone]   HPI:   Chief Complaint: OBESITY Leslie Benson is here to discuss her progress with her obesity treatment plan. She is on the Category 2 plan and is following her eating plan approximately 100 % of the time. She states she is walking 30 minutes 7 times per week. Leslie Benson is "sticking to the plan" and doing more meal planning. She states its "not too hard". Her hunger is controlled and she is not stress eating.   Her weight is 191 lb (86.6 kg) today and has had a weight loss of 5 pounds over a period of 1 week since her last visit. She has lost 5 lbs since starting treatment with [name].  Hyperlipidemia Leslie Benson has hyperlipidemia and has been trying to improve her cholesterol levels with intensive lifestyle modification including a low saturated fat diet, exercise and weight loss. She is taking atorvastatin 20mg  and her labs are well controlled. She denies any myalgias.  Depression with emotional eating behaviors Leslie Benson is struggling with emotional eating and using food for comfort to the extent that it is negatively impacting her health. She is seeing her own psychiatrist. She shows no sign of suicidal or homicidal ideations.  History of Vitamin D deficiency Leslie Benson has a history of vitamin D deficiency. Her last vitamin D level was 41.24 on 10/30/17.  Insulin Resistance Leslie Benson has a diagnosis of insulin resistance based on her elevated fasting insulin level >5. Although Leslie Benson's blood glucose readings are still under good control, insulin resistance puts her at greater risk of metabolic syndrome and diabetes. Her last A1c was 5.5 and Insulin was 10.4 on 01/23/18. She is not taking metformin currently and continues to work on diet and exercise to decrease risk of diabetes.  At risk for diabetes Leslie Benson is at higher than average risk for developing diabetes due to her insulin resistance and obesity.  ALLERGIES: Allergies  Allergen Reactions  . Codeine  Nausea And Vomiting  . Compazine [Prochlorperazine Edisylate]     "feels weird"  . Sulfa Antibiotics     Unknown reaction.    MEDICATIONS: Current Outpatient Medications on File Prior to Visit  Medication Sig Dispense Refill  . ALPRAZolam (XANAX) 1 MG tablet Take 2 mg by mouth 3 (three) times daily as needed for anxiety (anxiety). .    . atorvastatin (LIPITOR) 20 MG tablet TAKE 1 TABLET (20 MG TOTAL) BY MOUTH DAILY. 90 tablet 0  . gabapentin (NEURONTIN) 600 MG tablet Take 1 tablet (600 mg total) by mouth 3 (three) times daily. (Patient taking differently: Take 1,200 mg by mouth 3 (three) times daily. Dr. Toy Care) 90 tablet 0  . levothyroxine (SYNTHROID, LEVOTHROID) 25 MCG tablet TAKE 1 TABLET ONE TIME DAILY BEFORE BREAKFAST 90 tablet 0  . omega-3 acid ethyl esters (LOVAZA) 1 G capsule Take 1 g by mouth 2 (two) times daily.    . pantoprazole (PROTONIX) 40 MG tablet TAKE 1 TABLET EVERY DAY 90 tablet 0  . QUEtiapine (SEROQUEL) 400 MG tablet Take 800 mg by mouth at bedtime.    . traMADol (ULTRAM) 50 MG tablet Take 2 tablets (100 mg total) by mouth every 6 (six) hours as needed for pain. (Patient taking differently: Take 100 mg by mouth every 6 (six) hours as needed for moderate pain. Dr. Toy Care) 30 tablet 0  . venlafaxine XR (EFFEXOR-XR) 75 MG 24 hr capsule Take 75 mg by mouth 3 (three) times daily.      No current facility-administered medications on file  prior to visit.     PAST MEDICAL HISTORY: Past Medical History:  Diagnosis Date  . ADHD   . Anxiety   . Barrett esophagus   . Breast nodule    benign  . Cervical arthritis 09/22/2013  . Depression   . Elevated liver enzymes   . Fatty liver   . Fibromyalgia   . Fibromyalgia   . GERD (gastroesophageal reflux disease)   . H/O hiatal hernia   . Headache(784.0)    migraines  . High cholesterol   . Lymphocytic colitis   . Migraines   . Pain   . Panic attack   . Paraesophageal hiatal hernia    repaired 2009  . Spinal stenosis   .  Thyroid disease   . Thyroid disease     PAST SURGICAL HISTORY: Past Surgical History:  Procedure Laterality Date  . ANKLE SURGERY    . CHOLECYSTECTOMY  1980s?  . EUS N/A 11/19/2013   Procedure: ESOPHAGEAL ENDOSCOPIC ULTRASOUND (EUS) RADIAL;  Surgeon: Arta Silence, MD;  Location: [facility];  Service: Endoscopy;  Laterality: N/A;  . LAPAROSCOPIC NISSEN FUNDOPLICATION  [id number]/[id number]  . LAPAROSCOPIC PARAESOPHAGEAL HERNIA REPAIR  03/05/2008   Dr Johney Maine  . TUBAL LIGATION  1990s?  Marland Kitchen VAGINAL HYSTERECTOMY  03/04/2001    SOCIAL HISTORY: Social History   Tobacco Use  . Smoking status: Never Smoker  . Smokeless tobacco: Never Used  Substance Use Topics  . Alcohol use: Yes    Alcohol/week: 0.0 standard drinks    Comment: wine occassionally  . Drug use: No    FAMILY HISTORY: Family History  Problem Relation Age of Onset  . Alcohol abuse Unknown   . Colon cancer Unknown   . Elevated Lipids Unknown   . Heart disease Unknown   . Anxiety disorder Mother   . Obesity Mother   . High Cholesterol Mother   . Thyroid cancer Mother   . Depression Mother   . High Cholesterol Father   . Heart disease Father   . Alcoholism Father     ROS: Review of Systems  Constitutional: Positive for weight loss.  Musculoskeletal: Negative for myalgias.  Psychiatric/Behavioral: Positive for depression. Negative for suicidal ideas.       Negative for homicidal ideations.    PHYSICAL EXAM: Blood pressure 107/76, pulse 94, temperature (S) 97.8 F (36.6 C), temperature source Oral, height 5' (1.524 m), weight 191 lb (86.6 kg), SpO2 96 %. Body mass index is 37.3 kg/m. Physical Exam  Constitutional: She is oriented to person, place, and time. She appears well-developed and well-nourished.  Cardiovascular: Normal rate.  Pulmonary/Chest: Effort normal.  Musculoskeletal: Normal range of motion.  Neurological: She is oriented to person, place, and time.  Skin: Skin is warm and dry.  Psychiatric: She  has a normal mood and affect. Her behavior is normal.  Vitals reviewed.   RECENT LABS AND TESTS: BMET    Component Value Date/Time   NA 141 01/23/2018 1142   K 4.1 01/23/2018 1142   CL 98 01/23/2018 1142   CO2 26 01/23/2018 1142   GLUCOSE 98 01/23/2018 1142   GLUCOSE 96 04/05/2017 1140   BUN 11 01/23/2018 1142   CREATININE 1.07 (H) 01/23/2018 1142   CALCIUM 9.5 01/23/2018 1142   GFRNONAA 57 (L) 01/23/2018 1142   GFRAA 66 01/23/2018 1142   Lab Results  Component Value Date   HGBA1C 5.5 01/23/2018   HGBA1C 5.5 04/05/2017   HGBA1C 5.5 08/27/2015   HGBA1C  09/30/2009  5.4 (NOTE)                                                                       According to the ADA Clinical Practice Recommendations for 2011, when HbA1c is used as a screening test:   >=6.5%   Diagnostic of Diabetes Mellitus           (if abnormal result  is confirmed)  5.7-6.4%   Increased risk of developing Diabetes Mellitus  References:Diagnosis and Classification of Diabetes Mellitus,Diabetes TMLY,6503,54(SFKCL 1):S62-S69 and Standards of Medical Care in         Diabetes - 2011,Diabetes EXNT,7001,74  (Suppl 1):S11-S61.   Lab Results  Component Value Date   INSULIN 10.4 01/23/2018   CBC    Component Value Date/Time   WBC 4.9 10/30/2017 1514   RBC 4.50 10/30/2017 1514   HGB 13.9 10/30/2017 1514   HCT 40.9 10/30/2017 1514   PLT 243.0 10/30/2017 1514   MCV 90.8 10/30/2017 1514   MCH 31.3 09/22/2015 2321   MCHC 34.0 10/30/2017 1514   RDW 14.0 10/30/2017 1514   LYMPHSABS 2.4 10/30/2017 1514   MONOABS 0.5 10/30/2017 1514   EOSABS 0.1 10/30/2017 1514   BASOSABS 0.0 10/30/2017 1514   Iron/TIBC/Ferritin/ %Sat    Component Value Date/Time   IRON 68 10/30/2017 1514   TIBC 319 10/30/2017 1514   FERRITIN 23 10/30/2017 1514   IRONPCTSAT 21 10/30/2017 1514   Lipid Panel     Component Value Date/Time   CHOL 170 10/30/2017 1514   TRIG 133.0 10/30/2017 1514   HDL 65.80 10/30/2017 1514   CHOLHDL 3  10/30/2017 1514   VLDL 26.6 10/30/2017 1514   LDLCALC 77 10/30/2017 1514   LDLDIRECT 168.0 04/05/2017 1140   Hepatic Function Panel     Component Value Date/Time   PROT 6.9 01/23/2018 1142   ALBUMIN 4.5 01/23/2018 1142   AST 20 01/23/2018 1142   ALT 24 01/23/2018 1142   ALKPHOS 116 01/23/2018 1142   BILITOT 0.3 01/23/2018 1142   BILIDIR 0.1 10/30/2017 1514   IBILI NOT CALCULATED 09/21/2013 2215      Component Value Date/Time   TSH 1.530 01/23/2018 1142   TSH 2.05 04/05/2017 1140   TSH 3.72 08/05/2015 0903   Results for [employer], SETTLE (MRN [id number]) as of 01/30/2018 14:21  Ref. Range 10/30/2017 15:14  VITD Latest Ref Range: 30.00 - 100.00 ng/mL 41.24    ASSESSMENT AND PLAN: Other hyperlipidemia  Insulin resistance  Other depression - with emotional eating  History of vitamin D deficiency  At risk for diabetes mellitus  Class 1 obesity with serious comorbidity and body mass index (BMI) of 32.0 to 32.9 in adult, unspecified obesity type  PLAN:  Hyperlipidemia Leslie Benson was informed of the American Heart Association Guidelines emphasizing intensive lifestyle modifications as the first line treatment for hyperlipidemia. We discussed many lifestyle modifications today in depth, and Shirel will continue to work on decreasing saturated fats such as fatty red meat, butter and many fried foods, as well as decrease carbohydrates in her diet. She will also increase vegetables and lean protein in her diet and continue to work on exercise and weight loss efforts. She agrees to follow up in 2 weeks.  History of  Vitamin D deficiency Leslie Benson agrees to begin to take OTC vit D 2,000 IU daily and follow up as directed.  Insulin Resistance Leslie Benson will continue to work on weight loss, exercise, and decreasing simple carbohydrates in her diet to help decrease the risk of diabetes. She was informed that eating too many simple carbohydrates or too many calories at one sitting increases  the likelihood of GI side effects. Our registered dietitian is going to discuss insulin resistance with her. Leslie Benson agreed to decrease carbohydrates and follow up with [name] as directed to monitor her progress in 2 weeks.  Diabetes risk counseling Leslie Benson was given extended (15 minutes) diabetes prevention counseling today. She is 57 y.o. female and has risk factors for diabetes including insulin resistance and obesity. We discussed intensive lifestyle modifications today with an emphasis on weight loss as well as increasing exercise and decreasing simple carbohydrates in her diet.  Depression with Emotional Eating Behaviors We discussed cognitive behavior therapy strategies today to help Leslie Benson deal with her emotional eating and depression.   Obesity Georga is currently in the action stage of change. As such, her goal is to continue with weight loss efforts. She has agreed to follow the Category 2 plan. She agrees to continue to do more meal planning. Krystina has been instructed to work up to a goal of 150 minutes of combined cardio and strengthening exercise per week for weight loss and overall health benefits. We discussed the following Behavioral Modification Strategies today: increasing lean protein intake, decreasing simple carbohydrates, increasing vegetables, increase H2O intake, no skipping meals, and work on meal planning and easy cooking plans.  Hedi has agreed to follow up with our clinic in 2 weeks. She was informed of the importance of frequent follow up visits to maximize her success with intensive lifestyle modifications for her multiple health conditions.   OBESITY BEHAVIORAL INTERVENTION VISIT  Today's visit was # 2   Starting weight: 196 lbs Starting date: 01/23/18 Today's weight : Weight: 191 lb (86.6 kg)  Today's date: 01/29/2018 Total lbs lost to date: 5 At least 15 minutes were spent on discussing the following behavioral intervention visit.   ASK: We discussed  the diagnosis of obesity with [name] today and Anaika agreed to give [name] permission to discuss obesity behavioral modification therapy today.  ASSESS: Micaylah has the diagnosis of obesity and her BMI today is 37.3. Lettie is in the action stage of change.   ADVISE: Majel was educated on the multiple health risks of obesity as well as the benefit of weight loss to improve her health. She was advised of the need for long term treatment and the importance of lifestyle modifications to improve her current health and to decrease her risk of future health problems.  AGREE: Multiple dietary modification options and treatment options were discussed and Saron agreed to follow the recommendations documented in the above note.  ARRANGE: Corlis was educated on the importance of frequent visits to treat obesity as outlined per CMS and USPSTF guidelines and agreed to schedule her next follow up appointment today.  I, Marcille Blanco, am acting as Location manager for [employer]. Owens Shark, MD  I have reviewed the above documentation for accuracy and completeness, and I agree with the above. -Jearld Lesch, DO

## 2018-02-11 ENCOUNTER — Encounter (INDEPENDENT_AMBULATORY_CARE_PROVIDER_SITE_OTHER): Payer: Self-pay

## 2018-02-11 ENCOUNTER — Ambulatory Visit (INDEPENDENT_AMBULATORY_CARE_PROVIDER_SITE_OTHER): Payer: Medicare HMO | Admitting: Bariatrics

## 2018-02-14 ENCOUNTER — Encounter (INDEPENDENT_AMBULATORY_CARE_PROVIDER_SITE_OTHER): Payer: Self-pay | Admitting: Bariatrics

## 2018-02-14 ENCOUNTER — Ambulatory Visit (INDEPENDENT_AMBULATORY_CARE_PROVIDER_SITE_OTHER): Payer: Medicare HMO | Admitting: Bariatrics

## 2018-02-14 VITALS — BP 103/70 | HR 96 | Temp 97.7°F | Ht 64.0 in | Wt 194.0 lb

## 2018-02-14 DIAGNOSIS — E669 Obesity, unspecified: Secondary | ICD-10-CM

## 2018-02-14 DIAGNOSIS — Z6833 Body mass index (BMI) 33.0-33.9, adult: Secondary | ICD-10-CM

## 2018-02-14 DIAGNOSIS — E7849 Other hyperlipidemia: Secondary | ICD-10-CM

## 2018-02-14 DIAGNOSIS — E8881 Metabolic syndrome: Secondary | ICD-10-CM | POA: Diagnosis not present

## 2018-02-14 DIAGNOSIS — E559 Vitamin D deficiency, unspecified: Secondary | ICD-10-CM | POA: Diagnosis not present

## 2018-02-14 MED ORDER — METFORMIN HCL 500 MG PO TABS: 500.0000 mg | ORAL_TABLET | Freq: Every day | ORAL | 0 refills | Status: DC

## 2018-02-14 MED ORDER — VITAMIN D 50 MCG (2000 UT) PO TABS: 2000.0000 [IU] | ORAL_TABLET | Freq: Every day | ORAL | Status: DC

## 2018-02-15 ENCOUNTER — Other Ambulatory Visit: Payer: Self-pay | Admitting: Family Medicine

## 2018-02-15 ENCOUNTER — Other Ambulatory Visit: Payer: Self-pay | Admitting: Adult Health

## 2018-02-15 ENCOUNTER — Other Ambulatory Visit (INDEPENDENT_AMBULATORY_CARE_PROVIDER_SITE_OTHER): Payer: Medicare HMO

## 2018-02-15 DIAGNOSIS — E7849 Other hyperlipidemia: Secondary | ICD-10-CM | POA: Diagnosis not present

## 2018-02-15 DIAGNOSIS — R748 Abnormal levels of other serum enzymes: Secondary | ICD-10-CM | POA: Diagnosis not present

## 2018-02-15 LAB — HEPATIC FUNCTION PANEL
ALBUMIN: 4.6 g/dL (ref 3.5–5.2)
ALK PHOS: 103 U/L (ref 39–117)
ALT: 20 U/L (ref 0–35)
AST: 16 U/L (ref 0–37)
BILIRUBIN DIRECT: 0.1 mg/dL (ref 0.0–0.3)
TOTAL PROTEIN: 7.2 g/dL (ref 6.0–8.3)
Total Bilirubin: 0.3 mg/dL (ref 0.2–1.2)

## 2018-02-15 LAB — LIPID PANEL
CHOL/HDL RATIO: 3
Cholesterol: 195 mg/dL (ref 0–200)
HDL: 71.9 mg/dL (ref >39.00)
LDL Cholesterol: 106 mg/dL — ABNORMAL HIGH (ref 0–99)
NONHDL: 123.44
Triglycerides: 85 mg/dL (ref 0.0–149.0)
VLDL: 17 mg/dL (ref 0.0–40.0)

## 2018-02-19 DIAGNOSIS — E8881 Metabolic syndrome: Secondary | ICD-10-CM | POA: Insufficient documentation

## 2018-02-19 NOTE — Progress Notes (Signed)
Office: [phone]  /  Fax: [phone]   HPI:   Chief Complaint: OBESITY Leslie Benson is here to discuss her progress with her obesity treatment plan. She is on the  follow the Category 2 plan and is following her eating plan approximately 70 % of the time. She states she is exercising by walking for 30 minutes 7 times per week. Leslie Benson is currently struggling with not being able to eat all 3 meals. She did well with lunch and breakfast.Her hunger was controlled most days. She reports eating out some.  Her weight is 194 lb (88 kg) today and has not lost weight since her last visit. She has lost 2 lbs since starting treatment with [name].  Vitamin D deficiency Leslie Benson has a diagnosis of vitamin D deficiency. She is currently taking vit D OTC and denies nausea, vomiting or muscle weakness.  Ref. Range 10/30/2017 15:14  VITD Latest Ref Range: 30.00 - 100.00 ng/mL 41.24   Insulin Resistance Leslie Benson has a diagnosis of insulin resistance based on her elevated fasting insulin level of 10.4 and HgbA1c of 5.5. Although Leslie Benson's blood glucose readings are still under good control, insulin resistance puts her at greater risk of metabolic syndrome and diabetes. She is not taking metformin currently and continues to work on diet and exercise to decrease risk of diabetes.  Hyperlipidemia Leslie Benson has hyperlipidemia and has been trying to improve her cholesterol levels with intensive lifestyle modification including a low saturated fat diet, exercise and weight loss. She denies any chest pain, claudication or myalgias. She is currently taking Atorvastatin.   ALLERGIES: Allergies  Allergen Reactions  . Codeine Nausea And Vomiting  . Compazine [Prochlorperazine Edisylate]     "feels weird"  . Sulfa Antibiotics     Unknown reaction.    MEDICATIONS: Current Outpatient Medications on File Prior to Visit  Medication Sig Dispense Refill  . ALPRAZolam (XANAX) 1 MG tablet Take 2 mg by mouth 3 (three) times  daily as needed for anxiety (anxiety). .    . atorvastatin (LIPITOR) 20 MG tablet TAKE 1 TABLET (20 MG TOTAL) BY MOUTH DAILY. 90 tablet 0  . gabapentin (NEURONTIN) 600 MG tablet Take 1 tablet (600 mg total) by mouth 3 (three) times daily. (Patient taking differently: Take 1,200 mg by mouth 3 (three) times daily. Dr. Toy Care) 90 tablet 0  . levothyroxine (SYNTHROID, LEVOTHROID) 25 MCG tablet TAKE 1 TABLET ONE TIME DAILY BEFORE BREAKFAST 90 tablet 0  . omega-3 acid ethyl esters (LOVAZA) 1 G capsule Take 1 g by mouth 2 (two) times daily.    . pantoprazole (PROTONIX) 40 MG tablet TAKE 1 TABLET EVERY DAY 90 tablet 0  . QUEtiapine (SEROQUEL) 400 MG tablet Take 800 mg by mouth at bedtime.    . traMADol (ULTRAM) 50 MG tablet Take 2 tablets (100 mg total) by mouth every 6 (six) hours as needed for pain. (Patient taking differently: Take 100 mg by mouth every 6 (six) hours as needed for moderate pain. Dr. Toy Care) 30 tablet 0  . venlafaxine XR (EFFEXOR-XR) 75 MG 24 hr capsule Take 75 mg by mouth 3 (three) times daily.      No current facility-administered medications on file prior to visit.     PAST MEDICAL HISTORY: Past Medical History:  Diagnosis Date  . ADHD   . Anxiety   . Barrett esophagus   . Breast nodule    benign  . Cervical arthritis 09/22/2013  . Depression   . Elevated liver enzymes   .  Fatty liver   . Fibromyalgia   . Fibromyalgia   . GERD (gastroesophageal reflux disease)   . H/O hiatal hernia   . Headache(784.0)    migraines  . High cholesterol   . Lymphocytic colitis   . Migraines   . Pain   . Panic attack   . Paraesophageal hiatal hernia    repaired 2009  . Spinal stenosis   . Thyroid disease   . Thyroid disease     PAST SURGICAL HISTORY: Past Surgical History:  Procedure Laterality Date  . ANKLE SURGERY    . CHOLECYSTECTOMY  1980s?  . EUS N/A 11/19/2013   Procedure: ESOPHAGEAL ENDOSCOPIC ULTRASOUND (EUS) RADIAL;  Surgeon: Arta Silence, MD;  Location: [facility];   Service: Endoscopy;  Laterality: N/A;  . LAPAROSCOPIC NISSEN FUNDOPLICATION  39/53/2023  . LAPAROSCOPIC PARAESOPHAGEAL HERNIA REPAIR  03/05/2008   Dr Johney Maine  . TUBAL LIGATION  1990s?  Marland Kitchen VAGINAL HYSTERECTOMY  03/04/2001    SOCIAL HISTORY: Social History   Tobacco Use  . Smoking status: Never Smoker  . Smokeless tobacco: Never Used  Substance Use Topics  . Alcohol use: Yes    Alcohol/week: 0.0 standard drinks    Comment: wine occassionally  . Drug use: No    FAMILY HISTORY: Family History  Problem Relation Age of Onset  . Alcohol abuse Unknown   . Colon cancer Unknown   . Elevated Lipids Unknown   . Heart disease Unknown   . Anxiety disorder Mother   . Obesity Mother   . High Cholesterol Mother   . Thyroid cancer Mother   . Depression Mother   . High Cholesterol Father   . Heart disease Father   . Alcoholism Father     ROS: Review of Systems  Constitutional: Negative for weight loss.  Cardiovascular: Negative for chest pain and claudication.  Gastrointestinal: Negative for nausea and vomiting.  Musculoskeletal: Negative for myalgias.       Negative for muscle weakness    PHYSICAL EXAM: Blood pressure 103/70, pulse 96, temperature 97.7 F (36.5 C), temperature source Oral, height 5\' 4"  (1.626 m), weight 194 lb (88 kg), SpO2 95 %. Body mass index is 33.3 kg/m. Physical Exam  Constitutional: She is oriented to person, place, and time. She appears well-developed and well-nourished.  HENT:  Head: Normocephalic.  Neck: Normal range of motion.  Cardiovascular: Normal rate.  Pulmonary/Chest: Effort normal.  Musculoskeletal: Normal range of motion.  Neurological: She is alert and oriented to person, place, and time.  Skin: Skin is warm and dry.  Psychiatric: She has a normal mood and affect. Her behavior is normal.  Vitals reviewed.   RECENT LABS AND TESTS: BMET    Component Value Date/Time   NA 141 01/23/2018 1142   K 4.1 01/23/2018 1142   CL 98 01/23/2018  1142   CO2 26 01/23/2018 1142   GLUCOSE 98 01/23/2018 1142   GLUCOSE 96 04/05/2017 1140   BUN 11 01/23/2018 1142   CREATININE 1.07 (H) 01/23/2018 1142   CALCIUM 9.5 01/23/2018 1142   GFRNONAA 57 (L) 01/23/2018 1142   GFRAA 66 01/23/2018 1142   Lab Results  Component Value Date   HGBA1C 5.5 01/23/2018   HGBA1C 5.5 04/05/2017   HGBA1C 5.5 08/27/2015   HGBA1C  09/30/2009    5.4 (NOTE)  According to the ADA Clinical Practice Recommendations for 2011, when HbA1c is used as a screening test:   >=6.5%   Diagnostic of Diabetes Mellitus           (if abnormal result  is confirmed)  5.7-6.4%   Increased risk of developing Diabetes Mellitus  References:Diagnosis and Classification of Diabetes Mellitus,Diabetes VQMG,8676,19(JKDTO 1):S62-S69 and Standards of Medical Care in         Diabetes - 2011,Diabetes IZTI,4580,99  (Suppl 1):S11-S61.   Lab Results  Component Value Date   INSULIN 10.4 01/23/2018   CBC    Component Value Date/Time   WBC 4.9 10/30/2017 1514   RBC 4.50 10/30/2017 1514   HGB 13.9 10/30/2017 1514   HCT 40.9 10/30/2017 1514   PLT 243.0 10/30/2017 1514   MCV 90.8 10/30/2017 1514   MCH 31.3 09/22/2015 2321   MCHC 34.0 10/30/2017 1514   RDW 14.0 10/30/2017 1514   LYMPHSABS 2.4 10/30/2017 1514   MONOABS 0.5 10/30/2017 1514   EOSABS 0.1 10/30/2017 1514   BASOSABS 0.0 10/30/2017 1514   Iron/TIBC/Ferritin/ %Sat    Component Value Date/Time   IRON 68 10/30/2017 1514   TIBC 319 10/30/2017 1514   FERRITIN 23 10/30/2017 1514   IRONPCTSAT 21 10/30/2017 1514   Lipid Panel     Component Value Date/Time   CHOL 195 02/15/2018 1042   TRIG 85.0 02/15/2018 1042   HDL 71.90 02/15/2018 1042   CHOLHDL 3 02/15/2018 1042   VLDL 17.0 02/15/2018 1042   LDLCALC 106 (H) 02/15/2018 1042   LDLDIRECT 168.0 04/05/2017 1140   Hepatic Function Panel     Component Value Date/Time   PROT 7.2 02/15/2018 1736   PROT 6.9  01/23/2018 1142   ALBUMIN 4.6 02/15/2018 1736   ALBUMIN 4.5 01/23/2018 1142   AST 16 02/15/2018 1736   ALT 20 02/15/2018 1736   ALKPHOS 103 02/15/2018 1736   BILITOT 0.3 02/15/2018 1736   BILITOT 0.3 01/23/2018 1142   BILIDIR 0.1 02/15/2018 1736   IBILI NOT CALCULATED 09/21/2013 2215      Component Value Date/Time   TSH 1.530 01/23/2018 1142   TSH 2.05 04/05/2017 1140   TSH 3.72 08/05/2015 0903    Ref. Range 10/30/2017 15:14  VITD Latest Ref Range: 30.00 - 100.00 ng/mL 41.24    ASSESSMENT AND PLAN: Vitamin D deficiency - Plan: Cholecalciferol (VITAMIN D) 2000 units tablet  Insulin resistance - Plan: metFORMIN (GLUCOPHAGE) 500 MG tablet  Other hyperlipidemia  At risk for diabetes mellitus  Class 1 obesity with serious comorbidity and body mass index (BMI) of 33.0 to 33.9 in adult, unspecified obesity type  PLAN: Vitamin D Deficiency Leslie Benson was informed that low vitamin D levels contributes to fatigue and are associated with obesity, breast, and colon cancer. She agrees to continue to take OTC Vit D 2,000 IU daily and will follow up for routine testing of vitamin D, at least 2-3 times per year. She was informed of the risk of over-replacement of vitamin D and agrees to not increase her dose unless she discusses this with [name] first. Agrees to follow up with our clinic as directed.   Insulin Resistance Leslie Benson will continue to work on weight loss, exercise, and decreasing simple carbohydrates in her diet to help decrease the risk of diabetes. We dicussed metformin including benefits and risks. She was informed that eating too many simple carbohydrates or too many calories at one sitting increases the likelihood of GI side effects. Leslie Benson requested metformin for now and  prescription was written today. She will start Metformin 500 mg daily #30 with no refills.  Leslie Benson agreed to follow up with [name] as directed to monitor her progress.  Hyperlipidemia Leslie Benson was informed of the  American Heart Association Guidelines emphasizing intensive lifestyle modifications as the first line treatment for hyperlipidemia. We discussed many lifestyle modifications today in depth, and Leslie Benson will continue to work on decreasing saturated fats such as fatty red meat, butter and many fried foods. She will also increase vegetables and lean protein in her diet and continue to work on exercise and weight loss efforts. She agrees to continue to take lipid lowering agent.   Obesity Leslie Benson is currently in the action stage of change. As such, her goal is to continue with weight loss efforts She has agreed to follow the Category 2 plan  Given "[employer]" handout for when she has to eat out. Discussed spouse sabotaging her with chocolate and snack calories.  Leslie Benson has been instructed to work up to a goal of 150 minutes of combined cardio and strengthening exercise per week for weight loss and overall health benefits. We discussed the following Behavioral Modification Strategies today: increasing lean protein intake, decreasing simple carbohydrates, increasing water intake, no skipping meals, and decrease eating out.    Leslie Benson has agreed to follow up with our clinic in 2 weeks. She was informed of the importance of frequent follow up visits to maximize her success with intensive lifestyle modifications for her multiple health conditions.   OBESITY BEHAVIORAL INTERVENTION VISIT  Today's visit was # 3   Starting weight: 196 lb Starting date: 01/23/18 Today's weight : 194 lb Today's date: 02/14/18 Total lbs lost to date: 2 lb At least 15 minutes were spent on discussing the following behavioral intervention visit.   ASK: We discussed the diagnosis of obesity with Leslie Benson today and Leslie Benson agreed to give [name] permission to discuss obesity behavioral modification therapy today.  ASSESS: Shoua has the diagnosis of obesity and her BMI today is 33.28.  Leslie Benson is in the action  stage of change   ADVISE: Leslie Benson was educated on the multiple health risks of obesity as well as the benefit of weight loss to improve her health. She was advised of the need for long term treatment and the importance of lifestyle modifications to improve her current health and to decrease her risk of future health problems.  AGREE: Multiple dietary modification options and treatment options were discussed and  Leslie Benson agreed to follow the recommendations documented in the above note.  ARRANGE: Leslie Benson was educated on the importance of frequent visits to treat obesity as outlined per CMS and USPSTF guidelines and agreed to schedule her next follow up appointment today.  Leary Roca, am acting as transcriptionist for [employer], DO   I have reviewed the above documentation for accuracy and completeness, and I agree with the above. -Jearld Lesch, DO

## 2018-02-25 ENCOUNTER — Ambulatory Visit: Payer: Medicare HMO | Admitting: Internal Medicine

## 2018-02-27 ENCOUNTER — Ambulatory Visit (INDEPENDENT_AMBULATORY_CARE_PROVIDER_SITE_OTHER): Payer: Medicare HMO | Admitting: Bariatrics

## 2018-03-04 ENCOUNTER — Other Ambulatory Visit: Payer: Self-pay | Admitting: Adult Health

## 2018-03-04 ENCOUNTER — Ambulatory Visit (INDEPENDENT_AMBULATORY_CARE_PROVIDER_SITE_OTHER): Payer: Medicare HMO | Admitting: Bariatrics

## 2018-03-04 ENCOUNTER — Encounter (INDEPENDENT_AMBULATORY_CARE_PROVIDER_SITE_OTHER): Payer: Self-pay | Admitting: Bariatrics

## 2018-03-04 VITALS — BP 93/66 | HR 91 | Temp 98.3°F | Ht 64.0 in | Wt 188.0 lb

## 2018-03-04 DIAGNOSIS — E559 Vitamin D deficiency, unspecified: Secondary | ICD-10-CM | POA: Diagnosis not present

## 2018-03-04 DIAGNOSIS — E8881 Metabolic syndrome: Secondary | ICD-10-CM | POA: Diagnosis not present

## 2018-03-04 DIAGNOSIS — Z6832 Body mass index (BMI) 32.0-32.9, adult: Secondary | ICD-10-CM | POA: Diagnosis not present

## 2018-03-04 DIAGNOSIS — E669 Obesity, unspecified: Secondary | ICD-10-CM

## 2018-03-05 NOTE — Progress Notes (Signed)
Office: [phone]  /  Fax: [phone]   HPI:   Chief Complaint: OBESITY Leslie Benson is here to discuss her progress with her obesity treatment plan. She is on the Category 2 plan and is following her eating plan approximately 100 % of the time. She states she is walking and bike riding for 30 minutes 7 times per week. Leslie Benson states that she did not struggle with the plan. She denies significant hunger or significant cravings.  Her weight is 188 lb (85.3 kg) today and has had a weight loss of 6 pounds over a period of 2 to 3 weeks since her last visit. She has lost 8 lbs since starting treatment with [name].  Insulin Resistance [name] has a diagnosis of insulin resistance based on her elevated fasting insulin level >5. Last fasting insulin was 10.4 and Hgb A1c was 5.5. Although Leslie Benson's blood glucose readings are still under good control, insulin resistance puts her at greater risk of metabolic syndrome and diabetes. She is taking metformin without problems and she notes decrease in polyphagia. She continues to work on diet and exercise to decrease risk of diabetes.   Vitamin D Deficiency Leslie Benson has a diagnosis of vitamin D deficiency. She is taking OTC Vit D and denies nausea, vomiting or muscle weakness.  ALLERGIES: Allergies  Allergen Reactions  . Codeine Nausea And Vomiting  . Compazine [Prochlorperazine Edisylate]     "feels weird"  . Sulfa Antibiotics     Unknown reaction.    MEDICATIONS: Current Outpatient Medications on File Prior to Visit  Medication Sig Dispense Refill  . ALPRAZolam (XANAX) 1 MG tablet Take 2 mg by mouth 3 (three) times daily as needed for anxiety (anxiety). .    . atorvastatin (LIPITOR) 20 MG tablet TAKE 1 TABLET (20 MG TOTAL) BY MOUTH DAILY. 90 tablet 0  . Cholecalciferol (VITAMIN D) 2000 units tablet Take 1 tablet (2,000 Units total) by mouth daily. 30 tablet   . gabapentin (NEURONTIN) 600 MG tablet Take 1 tablet (600 mg total) by mouth 3 (three)  times daily. (Patient taking differently: Take 1,200 mg by mouth 3 (three) times daily. Dr. Toy Care) 90 tablet 0  . levothyroxine (SYNTHROID, LEVOTHROID) 25 MCG tablet TAKE 1 TABLET ONE TIME DAILY BEFORE BREAKFAST 90 tablet 0  . metFORMIN (GLUCOPHAGE) 500 MG tablet Take 1 tablet (500 mg total) by mouth daily with breakfast. 30 tablet 0  . omega-3 acid ethyl esters (LOVAZA) 1 G capsule Take 1 g by mouth 2 (two) times daily.    . pantoprazole (PROTONIX) 40 MG tablet TAKE 1 TABLET EVERY DAY 90 tablet 0  . QUEtiapine (SEROQUEL) 400 MG tablet Take 800 mg by mouth at bedtime.    . traMADol (ULTRAM) 50 MG tablet Take 2 tablets (100 mg total) by mouth every 6 (six) hours as needed for pain. (Patient taking differently: Take 100 mg by mouth every 6 (six) hours as needed for moderate pain. Dr. Toy Care) 30 tablet 0  . venlafaxine XR (EFFEXOR-XR) 75 MG 24 hr capsule Take 75 mg by mouth 3 (three) times daily.      No current facility-administered medications on file prior to visit.     PAST MEDICAL HISTORY: Past Medical History:  Diagnosis Date  . ADHD   . Anxiety   . Barrett esophagus   . Breast nodule    benign  . Cervical arthritis 09/22/2013  . Depression   . Elevated liver enzymes   . Fatty liver   . Fibromyalgia   .  Fibromyalgia   . GERD (gastroesophageal reflux disease)   . H/O hiatal hernia   . Headache(784.0)    migraines  . High cholesterol   . Lymphocytic colitis   . Migraines   . Pain   . Panic attack   . Paraesophageal hiatal hernia    repaired 2009  . Spinal stenosis   . Thyroid disease   . Thyroid disease     PAST SURGICAL HISTORY: Past Surgical History:  Procedure Laterality Date  . ANKLE SURGERY    . CHOLECYSTECTOMY  1980s?  . EUS N/A 11/19/2013   Procedure: ESOPHAGEAL ENDOSCOPIC ULTRASOUND (EUS) RADIAL;  Surgeon: Arta Silence, MD;  Location: [facility];  Service: Endoscopy;  Laterality: N/A;  . LAPAROSCOPIC NISSEN FUNDOPLICATION  24/82/5003  . LAPAROSCOPIC  PARAESOPHAGEAL HERNIA REPAIR  03/05/2008   Dr Johney Maine  . TUBAL LIGATION  1990s?  Marland Kitchen VAGINAL HYSTERECTOMY  03/04/2001    SOCIAL HISTORY: Social History   Tobacco Use  . Smoking status: Never Smoker  . Smokeless tobacco: Never Used  Substance Use Topics  . Alcohol use: Yes    Alcohol/week: 0.0 standard drinks    Comment: wine occassionally  . Drug use: No    FAMILY HISTORY: Family History  Problem Relation Age of Onset  . Alcohol abuse Unknown   . Colon cancer Unknown   . Elevated Lipids Unknown   . Heart disease Unknown   . Anxiety disorder Mother   . Obesity Mother   . High Cholesterol Mother   . Thyroid cancer Mother   . Depression Mother   . High Cholesterol Father   . Heart disease Father   . Alcoholism Father     ROS: Review of Systems  Constitutional: Positive for weight loss.  Gastrointestinal: Negative for nausea and vomiting.  Musculoskeletal:       Negative muscle weakness  Endo/Heme/Allergies:       Positive polyphagia    PHYSICAL EXAM: Blood pressure 93/66, pulse 91, temperature 98.3 F (36.8 C), temperature source Oral, height 5\' 4"  (1.626 m), weight 188 lb (85.3 kg), SpO2 97 %. Body mass index is 32.27 kg/m. Physical Exam  Constitutional: She is oriented to person, place, and time. She appears well-developed and well-nourished.  Cardiovascular: Normal rate.  Pulmonary/Chest: Effort normal.  Musculoskeletal: Normal range of motion.  Neurological: She is oriented to person, place, and time.  Skin: Skin is warm and dry.  Psychiatric: She has a normal mood and affect. Her behavior is normal.  Vitals reviewed.   RECENT LABS AND TESTS: BMET    Component Value Date/Time   NA 141 01/23/2018 1142   K 4.1 01/23/2018 1142   CL 98 01/23/2018 1142   CO2 26 01/23/2018 1142   GLUCOSE 98 01/23/2018 1142   GLUCOSE 96 04/05/2017 1140   BUN 11 01/23/2018 1142   CREATININE 1.07 (H) 01/23/2018 1142   CALCIUM 9.5 01/23/2018 1142   GFRNONAA 57 (L)  01/23/2018 1142   GFRAA 66 01/23/2018 1142   Lab Results  Component Value Date   HGBA1C 5.5 01/23/2018   HGBA1C 5.5 04/05/2017   HGBA1C 5.5 08/27/2015   HGBA1C  09/30/2009    5.4 (NOTE)  According to the ADA Clinical Practice Recommendations for 2011, when HbA1c is used as a screening test:   >=6.5%   Diagnostic of Diabetes Mellitus           (if abnormal result  is confirmed)  5.7-6.4%   Increased risk of developing Diabetes Mellitus  References:Diagnosis and Classification of Diabetes Mellitus,Diabetes QQVZ,5638,75(IEPPI 1):S62-S69 and Standards of Medical Care in         Diabetes - 2011,Diabetes RJJO,8416,60  (Suppl 1):S11-S61.   Lab Results  Component Value Date   INSULIN 10.4 01/23/2018   CBC    Component Value Date/Time   WBC 4.9 10/30/2017 1514   RBC 4.50 10/30/2017 1514   HGB 13.9 10/30/2017 1514   HCT 40.9 10/30/2017 1514   PLT 243.0 10/30/2017 1514   MCV 90.8 10/30/2017 1514   MCH 31.3 09/22/2015 2321   MCHC 34.0 10/30/2017 1514   RDW 14.0 10/30/2017 1514   LYMPHSABS 2.4 10/30/2017 1514   MONOABS 0.5 10/30/2017 1514   EOSABS 0.1 10/30/2017 1514   BASOSABS 0.0 10/30/2017 1514   Iron/TIBC/Ferritin/ %Sat    Component Value Date/Time   IRON 68 10/30/2017 1514   TIBC 319 10/30/2017 1514   FERRITIN 23 10/30/2017 1514   IRONPCTSAT 21 10/30/2017 1514   Lipid Panel     Component Value Date/Time   CHOL 195 02/15/2018 1042   TRIG 85.0 02/15/2018 1042   HDL 71.90 02/15/2018 1042   CHOLHDL 3 02/15/2018 1042   VLDL 17.0 02/15/2018 1042   LDLCALC 106 (H) 02/15/2018 1042   LDLDIRECT 168.0 04/05/2017 1140   Hepatic Function Panel     Component Value Date/Time   PROT 7.2 02/15/2018 1736   PROT 6.9 01/23/2018 1142   ALBUMIN 4.6 02/15/2018 1736   ALBUMIN 4.5 01/23/2018 1142   AST 16 02/15/2018 1736   ALT 20 02/15/2018 1736   ALKPHOS 103 02/15/2018 1736   BILITOT 0.3 02/15/2018 1736   BILITOT  0.3 01/23/2018 1142   BILIDIR 0.1 02/15/2018 1736   IBILI NOT CALCULATED 09/21/2013 2215      Component Value Date/Time   TSH 1.530 01/23/2018 1142   TSH 2.05 04/05/2017 1140   TSH 3.72 08/05/2015 0903    ASSESSMENT AND PLAN: Insulin resistance  Vitamin D deficiency  Class 1 obesity with serious comorbidity and body mass index (BMI) of 32.0 to 32.9 in adult, unspecified obesity type  PLAN:  Insulin Resistance Velora will continue to work on weight loss, exercise, and decreasing simple carbohydrates in her diet to help decrease the risk of diabetes. We dicussed metformin including benefits and risks. She was informed that eating too many simple carbohydrates or too many calories at one sitting increases the likelihood of GI side effects. Abria agrees to continue taking metformin and she agrees to follow up with our clinic in 2 weeks as directed to monitor her progress.  Vitamin D Deficiency Leler was informed that low vitamin D levels contributes to fatigue and are associated with obesity, breast, and colon cancer. Sylena agrees to continue taking OTC Vit D and will follow up for routine testing of vitamin D, at least 2-3 times per year. She was informed of the risk of over-replacement of vitamin D and agrees to not increase her dose unless she discusses this with [name] first. Laiylah agrees to follow up with our clinic in 2 weeks.  I spent > than 50% of the 15 minute visit on counseling as documented in the note.  Obesity Loreta is currently in the  action stage of change. As such, her goal is to continue with weight loss efforts She has agreed to follow the Category 2 plan Tanasha has been instructed to work up to a goal of 150 minutes of combined cardio and strengthening exercise per week for weight loss and overall health benefits. We discussed the following Behavioral Modification Strategies today: increasing lean protein intake, decreasing simple carbohydrates, increasing  vegetables, increase H20 intake, and work on meal planning and easy cooking plans Handout "Making smart fruit choices" and "Thanksgiving" were given.  Everlie has agreed to follow up with our clinic in 2 weeks. She was informed of the importance of frequent follow up visits to maximize her success with intensive lifestyle modifications for her multiple health conditions.   OBESITY BEHAVIORAL INTERVENTION VISIT  Today's visit was # 4   Starting weight: 196 lbs Starting date: 01/23/18 Today's weight : 188 lbs Today's date: 03/04/2018 Total lbs lost to date: 8    ASK: We discussed the diagnosis of obesity with [name] today and Myanna agreed to give [name] permission to discuss obesity behavioral modification therapy today.  ASSESS: Earnie has the diagnosis of obesity and her BMI today is 32.25 Donnika is in the action stage of change   ADVISE: Mckell was educated on the multiple health risks of obesity as well as the benefit of weight loss to improve her health. She was advised of the need for long term treatment and the importance of lifestyle modifications to improve her current health and to decrease her risk of future health problems.  AGREE: Multiple dietary modification options and treatment options were discussed and  Loralyn agreed to follow the recommendations documented in the above note.  ARRANGE: Wandy was educated on the importance of frequent visits to treat obesity as outlined per CMS and USPSTF guidelines and agreed to schedule her next follow up appointment today.  Wilhemena Durie, am acting as transcriptionist for [employer], DO  I have reviewed the above documentation for accuracy and completeness, and I agree with the above. -Jearld Lesch, DO

## 2018-03-05 NOTE — Telephone Encounter (Signed)
Sent to the pharmacy by e-scribe. 

## 2018-03-19 ENCOUNTER — Ambulatory Visit: Payer: Medicare HMO | Admitting: Internal Medicine

## 2018-03-19 ENCOUNTER — Ambulatory Visit (INDEPENDENT_AMBULATORY_CARE_PROVIDER_SITE_OTHER): Payer: Medicare HMO | Admitting: Bariatrics

## 2018-03-19 ENCOUNTER — Encounter: Payer: Self-pay | Admitting: Internal Medicine

## 2018-03-19 ENCOUNTER — Encounter (INDEPENDENT_AMBULATORY_CARE_PROVIDER_SITE_OTHER): Payer: Self-pay | Admitting: Bariatrics

## 2018-03-19 VITALS — BP 97/63 | HR 101 | Temp 97.6°F | Ht 64.0 in | Wt 185.0 lb

## 2018-03-19 VITALS — Ht 64.0 in | Wt 188.5 lb

## 2018-03-19 DIAGNOSIS — R1310 Dysphagia, unspecified: Secondary | ICD-10-CM

## 2018-03-19 DIAGNOSIS — K219 Gastro-esophageal reflux disease without esophagitis: Secondary | ICD-10-CM | POA: Diagnosis not present

## 2018-03-19 DIAGNOSIS — Z9189 Other specified personal risk factors, not elsewhere classified: Secondary | ICD-10-CM

## 2018-03-19 DIAGNOSIS — Z6831 Body mass index (BMI) 31.0-31.9, adult: Secondary | ICD-10-CM | POA: Diagnosis not present

## 2018-03-19 DIAGNOSIS — E7849 Other hyperlipidemia: Secondary | ICD-10-CM

## 2018-03-19 DIAGNOSIS — E8881 Metabolic syndrome: Secondary | ICD-10-CM

## 2018-03-19 DIAGNOSIS — E669 Obesity, unspecified: Secondary | ICD-10-CM

## 2018-03-19 MED ORDER — METFORMIN HCL 500 MG PO TABS: 500.0000 mg | ORAL_TABLET | Freq: Two times a day (BID) | ORAL | 0 refills | Status: DC

## 2018-03-19 NOTE — Progress Notes (Signed)
HISTORY OF PRESENT ILLNESS:  Leslie Benson is a 57 y.o. female on disability for fibromyalgia and chronic pain who was seen here on one occasion November 18, 2015 after long-standing care by [facility].  PLEASE SEE THAT DICTATION FOR details.  The impression and plan at that time were as follows:  ASSESSMENT:  #1. GERD. History of Barrett's with regression #2. History of paraesophageal hernia. Status post repair 2009 #3. Most recent colonoscopy 2010. Normal. Follow-up in 10 years recommended #4. Reported history of lymphocytic colitis #5. Irritable bowel syndrome with alternating bowel habits #6. Musculoskeletal pain related to fibromyalgia #7. Status post cholecystectomy #8. History of elevated transaminases likely fatty liver   PLAN:  #1. Reflux precautions #2. Continue PPI to control GERD symptoms. Lowest dose to maintain symptom relief recommended #3. Weight loss and exercise #4. Metamucil one to 2 tablespoons daily for alternating bowel habits #5. Continue follow-up with PCP for chronic pain syndrome and multiple medical problems  #6. Routine GI follow-up one year #. Routine follow-up screening colonoscopy 2020  * She did not follow-up as recommended but follows up at this time.  Her chief complaint today is that of intermittent solid food dysphasia of just 2 months duration.  This occurs about every other day.  She tells me that she has to induce vomiting for relief.  She continues on PPI without active reflux symptoms.  GI review of systems is otherwise negative.  Review of blood work from February 15, 2018 finds unremarkable liver panel.  Last CBC in July with hemoglobin 13.9.  CT scan of the abdomen and pelvis June 2017 without acute abnormality.  Of importance, there was fluid in the esophagus suggesting some degree of dysmotility.  REVIEW OF SYSTEMS:  All non-GI ROS negative except for  Past Medical History:  Diagnosis Date  . ADHD   . Anxiety   . Barrett esophagus    . Breast nodule    benign  . Cervical arthritis 09/22/2013  . Depression   . Elevated liver enzymes   . Fatty liver   . Fibromyalgia   . Fibromyalgia   . GERD (gastroesophageal reflux disease)   . H/O hiatal hernia   . Headache(784.0)    migraines  . High cholesterol   . Lymphocytic colitis   . Migraines   . Pain   . Panic attack   . Paraesophageal hiatal hernia    repaired 2009  . Spinal stenosis   . Thyroid disease   . Thyroid disease     Past Surgical History:  Procedure Laterality Date  . ANKLE SURGERY    . CHOLECYSTECTOMY  1980s?  . EUS N/A 11/19/2013   Procedure: ESOPHAGEAL ENDOSCOPIC ULTRASOUND (EUS) RADIAL;  Surgeon: Arta Silence, MD;  Location: [facility];  Service: Endoscopy;  Laterality: N/A;  . LAPAROSCOPIC NISSEN FUNDOPLICATION  [id number]/83/[id number]  . LAPAROSCOPIC PARAESOPHAGEAL HERNIA REPAIR  03/05/2008   Dr Johney Maine  . TUBAL LIGATION  1990s?  Marland Kitchen VAGINAL HYSTERECTOMY  03/04/2001    Social History Leslie Benson  reports that she has never smoked. She has never used smokeless tobacco. She reports that she drinks alcohol. She reports that she does not use drugs.  family history includes Alcohol abuse in her unknown relative; Alcoholism in her father; Anxiety disorder in her mother; Colon cancer in her unknown relative; Depression in her mother; Elevated Lipids in her unknown relative; Heart disease in her father and unknown relative; High Cholesterol in her father and mother; Obesity in her mother; Thyroid  cancer in her mother.  Allergies  Allergen Reactions  . Codeine Nausea And Vomiting  . Compazine [Prochlorperazine Edisylate]     "feels weird"  . Sulfa Antibiotics     Unknown reaction.       PHYSICAL EXAMINATION: Vital signs: Ht 5\' 4"  (1.626 m)   Wt 188 lb 8 oz (85.5 kg)   BMI 32.36 kg/m   Constitutional: generally well-appearing, no acute distress Psychiatric: alert and oriented x3, cooperative Eyes: extraocular movements intact, anicteric,  conjunctiva pink Mouth: oral pharynx moist, no lesions Neck: supple no lymphadenopathy Cardiovascular: heart regular rate and rhythm, no murmur Lungs: clear to auscultation bilaterally Abdomen: soft, nontender, obese, nondistended, no obvious ascites, no peritoneal signs, normal bowel sounds, no organomegaly Rectal: Omitted Extremities: no rubbing, cyanosis, or lower extremity edema bilaterally Skin: no lesions on visible extremities Neuro: No focal deficits. No asterixis.    ASSESSMENT:  1.  Intermittent solid food dysphasia of 2 months duration.  What sounds like intermittent food impaction.  She does have a history of GERD and Barrett's "with regression".  Also status post paraesophageal hernia repair 2009.  Rule out stricture.  Rule out dysmotility post fundoplication 2.  GERD.  On PPI 3.  Multiple additional diagnoses as outlined above in the HPI   PLAN:  1.  Upper endoscopy with possible esophageal dilation.The nature of the procedure, as well as the risks, benefits, and alternatives were carefully and thoroughly reviewed with the patient. Ample time for discussion and questions allowed. The patient understood, was satisfied, and agreed to proceed. 2.  Continue PPI 3.  Due for routine screening colonoscopy around October 2020

## 2018-03-19 NOTE — Patient Instructions (Signed)

## 2018-03-25 ENCOUNTER — Other Ambulatory Visit: Payer: Self-pay | Admitting: Adult Health

## 2018-03-25 DIAGNOSIS — R1084 Generalized abdominal pain: Secondary | ICD-10-CM

## 2018-03-26 NOTE — Progress Notes (Signed)
Office: [phone]  /  Fax: [phone]   HPI:   Chief Complaint: OBESITY Leslie Benson is here to discuss her progress with her obesity treatment plan. She is on the Category 2 plan and is following her eating plan approximately 80 % of the time. She states she is exercising 0 minutes 0 times per week. Leslie Benson is still doing well. She has not felt well and has not been exercising. She thinks that the metformin is helping.  Her weight is 185 lb (83.9 kg) today and has had a weight loss of 3 pounds over a period of 2 weeks since her last visit. She has lost 11 lbs since starting treatment with [name].  Insulin Resistance Leslie Benson has a diagnosis of insulin resistance based on her elevated fasting insulin level >5. Although Leslie Benson's blood glucose readings are still under good control, insulin resistance puts her at greater risk of metabolic syndrome and diabetes. Her Hgb A1c is 5.5 and fasting Insulin is 10.4 on 01/23/18. She is taking metformin currently and continues to work on diet and exercise to decrease risk of diabetes. Leslie Benson admits mild polyphagia.  At risk for diabetes Leslie Benson is at higher than average risk for developing diabetes due to her insulin resistance and obesity. She currently denies polyuria or polydipsia.  Hyperlipidemia Leslie Benson has hyperlipidemia and has been trying to improve her cholesterol levels with intensive lifestyle modification including a low saturated fat diet, exercise and weight loss. She is taking atorvastatin 20mg  and denies any myalgias.  ALLERGIES: Allergies  Allergen Reactions  . Codeine Nausea And Vomiting  . Compazine [Prochlorperazine Edisylate]     "feels weird"  . Sulfa Antibiotics     Unknown reaction.    MEDICATIONS: Current Outpatient Medications on File Prior to Visit  Medication Sig Dispense Refill  . ALPRAZolam (XANAX) 1 MG tablet Take 2 mg by mouth 3 (three) times daily as needed for anxiety (anxiety). .    . atorvastatin (LIPITOR) 20  MG tablet TAKE 1 TABLET (20 MG TOTAL) BY MOUTH DAILY. 90 tablet 0  . Cholecalciferol (VITAMIN D) 2000 units tablet Take 1 tablet (2,000 Units total) by mouth daily. 30 tablet   . gabapentin (NEURONTIN) 600 MG tablet Take 1 tablet (600 mg total) by mouth 3 (three) times daily. (Patient taking differently: Take 1,200 mg by mouth 3 (three) times daily. Dr. Toy Care) 90 tablet 0  . levothyroxine (SYNTHROID, LEVOTHROID) 25 MCG tablet TAKE 1 TABLET ONE TIME DAILY BEFORE BREAKFAST 90 tablet 2  . omega-3 acid ethyl esters (LOVAZA) 1 G capsule Take 1 g by mouth 2 (two) times daily.    . pantoprazole (PROTONIX) 40 MG tablet TAKE 1 TABLET EVERY DAY 90 tablet 0  . QUEtiapine (SEROQUEL) 400 MG tablet Take 800 mg by mouth at bedtime.    . traMADol (ULTRAM) 50 MG tablet Take 2 tablets (100 mg total) by mouth every 6 (six) hours as needed for pain. (Patient taking differently: Take 100 mg by mouth every 6 (six) hours as needed for moderate pain. Dr. Toy Care) 30 tablet 0  . venlafaxine XR (EFFEXOR-XR) 75 MG 24 hr capsule Take 75 mg by mouth 3 (three) times daily.      No current facility-administered medications on file prior to visit.     PAST MEDICAL HISTORY: Past Medical History:  Diagnosis Date  . ADHD   . Anxiety   . Barrett esophagus   . Breast nodule    benign  . Cervical arthritis 09/22/2013  . Depression   .  Elevated liver enzymes   . Fatty liver   . Fibromyalgia   . Fibromyalgia   . GERD (gastroesophageal reflux disease)   . H/O hiatal hernia   . Headache(784.0)    migraines  . High cholesterol   . Lymphocytic colitis   . Migraines   . Pain   . Panic attack   . Paraesophageal hiatal hernia    repaired 2009  . Spinal stenosis   . Thyroid disease   . Thyroid disease     PAST SURGICAL HISTORY: Past Surgical History:  Procedure Laterality Date  . ANKLE SURGERY    . CHOLECYSTECTOMY  1980s?  . EUS N/A 11/19/2013   Procedure: ESOPHAGEAL ENDOSCOPIC ULTRASOUND (EUS) RADIAL;  Surgeon: Arta Silence, MD;  Location: [facility];  Service: Endoscopy;  Laterality: N/A;  . LAPAROSCOPIC NISSEN FUNDOPLICATION  30/16/0109  . LAPAROSCOPIC PARAESOPHAGEAL HERNIA REPAIR  03/05/2008   Dr Johney Maine  . TUBAL LIGATION  1990s?  Marland Kitchen VAGINAL HYSTERECTOMY  03/04/2001    SOCIAL HISTORY: Social History   Tobacco Use  . Smoking status: Never Smoker  . Smokeless tobacco: Never Used  Substance Use Topics  . Alcohol use: Yes    Alcohol/week: 0.0 standard drinks    Comment: wine occassionally  . Drug use: No    FAMILY HISTORY: Family History  Problem Relation Age of Onset  . Alcohol abuse Unknown   . Colon cancer Unknown   . Elevated Lipids Unknown   . Heart disease Unknown   . Anxiety disorder Mother   . Obesity Mother   . High Cholesterol Mother   . Thyroid cancer Mother   . Depression Mother   . High Cholesterol Father   . Heart disease Father   . Alcoholism Father     ROS: Review of Systems  Constitutional: Positive for weight loss.  Genitourinary:       Negative for polyuria.  Musculoskeletal: Negative for myalgias.  Endo/Heme/Allergies: Negative for polydipsia.       Positive for polyphagia.    PHYSICAL EXAM: Blood pressure 97/63, pulse (!) 101, temperature 97.6 F (36.4 C), temperature source Oral, height 5\' 4"  (1.626 m), weight 185 lb (83.9 kg), SpO2 94 %. Body mass index is 31.76 kg/m. Physical Exam  Constitutional: She is oriented to person, place, and time. She appears well-developed and well-nourished.  Cardiovascular: Normal rate.  Pulmonary/Chest: Effort normal.  Musculoskeletal: Normal range of motion.  Neurological: She is oriented to person, place, and time.  Skin: Skin is warm and dry.  Psychiatric: She has a normal mood and affect. Her behavior is normal.  Vitals reviewed.   RECENT LABS AND TESTS: BMET    Component Value Date/Time   NA 141 01/23/2018 1142   K 4.1 01/23/2018 1142   CL 98 01/23/2018 1142   CO2 26 01/23/2018 1142   GLUCOSE 98  01/23/2018 1142   GLUCOSE 96 04/05/2017 1140   BUN 11 01/23/2018 1142   CREATININE 1.07 (H) 01/23/2018 1142   CALCIUM 9.5 01/23/2018 1142   GFRNONAA 57 (L) 01/23/2018 1142   GFRAA 66 01/23/2018 1142   Lab Results  Component Value Date   HGBA1C 5.5 01/23/2018   HGBA1C 5.5 04/05/2017   HGBA1C 5.5 08/27/2015   HGBA1C  09/30/2009    5.4 (NOTE)  According to the ADA Clinical Practice Recommendations for 2011, when HbA1c is used as a screening test:   >=6.5%   Diagnostic of Diabetes Mellitus           (if abnormal result  is confirmed)  5.7-6.4%   Increased risk of developing Diabetes Mellitus  References:Diagnosis and Classification of Diabetes Mellitus,Diabetes UUVO,5366,44(IHKVQ 1):S62-S69 and Standards of Medical Care in         Diabetes - 2011,Diabetes QVZD,6387,56  (Suppl 1):S11-S61.   Lab Results  Component Value Date   INSULIN 10.4 01/23/2018   CBC    Component Value Date/Time   WBC 4.9 10/30/2017 1514   RBC 4.50 10/30/2017 1514   HGB 13.9 10/30/2017 1514   HCT 40.9 10/30/2017 1514   PLT 243.0 10/30/2017 1514   MCV 90.8 10/30/2017 1514   MCH 31.3 09/22/2015 2321   MCHC 34.0 10/30/2017 1514   RDW 14.0 10/30/2017 1514   LYMPHSABS 2.4 10/30/2017 1514   MONOABS 0.5 10/30/2017 1514   EOSABS 0.1 10/30/2017 1514   BASOSABS 0.0 10/30/2017 1514   Iron/TIBC/Ferritin/ %Sat    Component Value Date/Time   IRON 68 10/30/2017 1514   TIBC 319 10/30/2017 1514   FERRITIN 23 10/30/2017 1514   IRONPCTSAT 21 10/30/2017 1514   Lipid Panel     Component Value Date/Time   CHOL 195 02/15/2018 1042   TRIG 85.0 02/15/2018 1042   HDL 71.90 02/15/2018 1042   CHOLHDL 3 02/15/2018 1042   VLDL 17.0 02/15/2018 1042   LDLCALC 106 (H) 02/15/2018 1042   LDLDIRECT 168.0 04/05/2017 1140   Hepatic Function Panel     Component Value Date/Time   PROT 7.2 02/15/2018 1736   PROT 6.9 01/23/2018 1142   ALBUMIN 4.6 02/15/2018  1736   ALBUMIN 4.5 01/23/2018 1142   AST 16 02/15/2018 1736   ALT 20 02/15/2018 1736   ALKPHOS 103 02/15/2018 1736   BILITOT 0.3 02/15/2018 1736   BILITOT 0.3 01/23/2018 1142   BILIDIR 0.1 02/15/2018 1736   IBILI NOT CALCULATED 09/21/2013 2215      Component Value Date/Time   TSH 1.530 01/23/2018 1142   TSH 2.05 04/05/2017 1140   TSH 3.72 08/05/2015 0903   Results for Leslie, Benson (MRN [id number]) as of 03/26/2018 12:15  Ref. Range 10/30/2017 15:14  VITD Latest Ref Range: 30.00 - 100.00 ng/mL 41.24   ASSESSMENT AND PLAN: Insulin resistance - Plan: metFORMIN (GLUCOPHAGE) 500 MG tablet  Other hyperlipidemia  At risk for diabetes mellitus  Class 1 obesity with serious comorbidity and body mass index (BMI) of 31.0 to 31.9 in adult, unspecified obesity type  PLAN:  Insulin Resistance Davena will continue to work on weight loss, exercise, and decreasing simple carbohydrates in her diet to help decrease the risk of diabetes. She was informed that eating too many simple carbohydrates or too many calories at one sitting increases the likelihood of GI side effects. Corissa agreed to continue taking metformin 500mg , 1 tablet in AM, 1 tablet at lunch #60 with no refills and prescription was written today. Glynis agreed to follow up with [name] as directed to monitor her progress in 2 weeks.  Diabetes risk counseling Lajuan was given extended (15 minutes) diabetes prevention counseling today. She is 57 y.o. female and has risk factors for diabetes including insulin resistance and obesity. We discussed intensive lifestyle modifications today with an emphasis on weight loss as well as increasing exercise and decreasing simple carbohydrates in her diet.  Hyperlipidemia Ronnika was informed of the  American Heart Association Guidelines emphasizing intensive lifestyle modifications as the first line treatment for hyperlipidemia. We discussed many lifestyle modifications today in depth, and  Trula will continue to work on decreasing saturated fats such as fatty red meat, butter and many fried foods. She will also increase vegetables and lean protein in her diet and continue to work on exercise and weight loss efforts. Kenedy agrees to continue atorvastatin and will follow up as directed.  Obesity Klaira is currently in the action stage of change. As such, her goal is to continue with weight loss efforts. She has agreed to follow the Category 2 plan and she will continue meal planning and intentional eating. Samie has been instructed to work up to a goal of 150 minutes of combined cardio and strengthening exercise per week for weight loss and overall health benefits. We discussed the following Behavioral Modification Strategies today: increasing lean protein intake, decreasing simple carbohydrates, increasing vegetables, increase H2O intake, holiday eating strategies, and celebration eating strategies.  Hannahgrace has agreed to follow up with our clinic in 2 weeks. She was informed of the importance of frequent follow up visits to maximize her success with intensive lifestyle modifications for her multiple health conditions.   OBESITY BEHAVIORAL INTERVENTION VISIT  Today's visit was # 5   Starting weight: 196 lbs Starting date: 01/23/18 Today's weight : Weight: 185 lb (83.9 kg)  Today's date: 03/19/2018 Total lbs lost to date: 11  ASK: We discussed the diagnosis of obesity with [name] today and Aika agreed to give [name] permission to discuss obesity behavioral modification therapy today.  ASSESS: [place] has the diagnosis of obesity and her BMI today is 31.74. Ailine is in the action stage of change.   ADVISE: Shawntia was educated on the multiple health risks of obesity as well as the benefit of weight loss to improve her health. She was advised of the need for long term treatment and the importance of lifestyle modifications to improve her current health and  to decrease her risk of future health problems.  AGREE: Multiple dietary modification options and treatment options were discussed and Babbette agreed to follow the recommendations documented in the above note.  ARRANGE: Dalia was educated on the importance of frequent visits to treat obesity as outlined per CMS and USPSTF guidelines and agreed to schedule her next follow up appointment today.  I, Marcille Blanco, am acting as Location manager for [employer]. Owens Shark, DO  I have reviewed the above documentation for accuracy and completeness, and I agree with the above. -Jearld Lesch, DO

## 2018-03-26 NOTE — Telephone Encounter (Signed)
Needs cpx 

## 2018-03-27 NOTE — Telephone Encounter (Signed)
Sent to the pharmacy by e-scribe for 90 days.  I have scheduled the pt for cpx on 05/03/17 and instructed her to come fasting.

## 2018-04-03 ENCOUNTER — Ambulatory Visit (INDEPENDENT_AMBULATORY_CARE_PROVIDER_SITE_OTHER): Payer: Medicare HMO | Admitting: Bariatrics

## 2018-04-03 ENCOUNTER — Encounter (INDEPENDENT_AMBULATORY_CARE_PROVIDER_SITE_OTHER): Payer: Self-pay | Admitting: Bariatrics

## 2018-04-03 VITALS — BP 97/66 | HR 97 | Temp 97.8°F | Ht 64.0 in | Wt 185.0 lb

## 2018-04-03 DIAGNOSIS — E8881 Metabolic syndrome: Secondary | ICD-10-CM

## 2018-04-03 DIAGNOSIS — E669 Obesity, unspecified: Secondary | ICD-10-CM | POA: Diagnosis not present

## 2018-04-03 DIAGNOSIS — K5909 Other constipation: Secondary | ICD-10-CM | POA: Diagnosis not present

## 2018-04-03 DIAGNOSIS — E038 Other specified hypothyroidism: Secondary | ICD-10-CM | POA: Diagnosis not present

## 2018-04-03 DIAGNOSIS — Z6831 Body mass index (BMI) 31.0-31.9, adult: Secondary | ICD-10-CM | POA: Diagnosis not present

## 2018-04-04 NOTE — Progress Notes (Signed)
Office: [phone]  /  Fax: [phone]   HPI:   Chief Complaint: OBESITY Leslie Benson is here to discuss her progress with her obesity treatment plan. She is on the Category 2 plan and is following her eating plan approximately 75 % of the time. She states she is exercising 0 minutes 0 times per week. Leslie Benson did struggle during the holidays with more dessert and candy. Her weight is 185 lb (83.9 kg) today and she has maintained weight since her last visit. She has lost 11 lbs since starting treatment with [name].  Hypothyroidism Leslie Benson has a diagnosis of hypothyroidism and she is currently controlled. She is on levothyroxine. She denies hot or cold intolerance or palpitations.  Insulin Resistance Leslie Benson has a diagnosis of insulin resistance based on her elevated fasting insulin level of 10.4. Her last A1c was at 5.5. Although Leslie Benson's blood glucose readings are still under good control, insulin resistance puts her at greater risk of metabolic syndrome and diabetes. She is taking metformin currently and continues to work on diet and exercise to decrease risk of diabetes.  Constipation Leslie Benson notes constipation for the last few weeks, worse since attempting weight loss. She states BM are less frequent and are not hard and painful. She denies hematochezia or melena. Leslie Benson has no stool retention.  ALLERGIES: Allergies  Allergen Reactions  . Codeine Nausea And Vomiting  . Compazine [Prochlorperazine Edisylate]     "feels weird"  . Sulfa Antibiotics     Unknown reaction.    MEDICATIONS: Current Outpatient Medications on File Prior to Visit  Medication Sig Dispense Refill  . ALPRAZolam (XANAX) 1 MG tablet Take 2 mg by mouth 3 (three) times daily as needed for anxiety (anxiety). .    . atorvastatin (LIPITOR) 20 MG tablet TAKE 1 TABLET (20 MG TOTAL) BY MOUTH DAILY. 90 tablet 0  . Cholecalciferol (VITAMIN D) 2000 units tablet Take 1 tablet (2,000 Units total) by mouth daily. 30 tablet     . gabapentin (NEURONTIN) 600 MG tablet Take 1 tablet (600 mg total) by mouth 3 (three) times daily. (Patient taking differently: Take 1,200 mg by mouth 3 (three) times daily. Dr. Toy Care) 90 tablet 0  . levothyroxine (SYNTHROID, LEVOTHROID) 25 MCG tablet TAKE 1 TABLET ONE TIME DAILY BEFORE BREAKFAST 90 tablet 2  . metFORMIN (GLUCOPHAGE) 500 MG tablet Take 1 tablet (500 mg total) by mouth 2 (two) times daily with a meal. 60 tablet 0  . omega-3 acid ethyl esters (LOVAZA) 1 G capsule Take 1 g by mouth 2 (two) times daily.    . pantoprazole (PROTONIX) 40 MG tablet TAKE 1 TABLET EVERY DAY 90 tablet 0  . QUEtiapine (SEROQUEL) 400 MG tablet Take 800 mg by mouth at bedtime.    . traMADol (ULTRAM) 50 MG tablet Take 2 tablets (100 mg total) by mouth every 6 (six) hours as needed for pain. (Patient taking differently: Take 100 mg by mouth every 6 (six) hours as needed for moderate pain. Dr. Toy Care) 30 tablet 0  . venlafaxine XR (EFFEXOR-XR) 75 MG 24 hr capsule Take 75 mg by mouth 3 (three) times daily.      No current facility-administered medications on file prior to visit.     PAST MEDICAL HISTORY: Past Medical History:  Diagnosis Date  . ADHD   . Anxiety   . Barrett esophagus   . Breast nodule    benign  . Cervical arthritis 09/22/2013  . Depression   . Elevated liver enzymes   . Fatty  liver   . Fibromyalgia   . Fibromyalgia   . GERD (gastroesophageal reflux disease)   . H/O hiatal hernia   . Headache(784.0)    migraines  . High cholesterol   . Lymphocytic colitis   . Migraines   . Pain   . Panic attack   . Paraesophageal hiatal hernia    repaired 2009  . Spinal stenosis   . Thyroid disease   . Thyroid disease     PAST SURGICAL HISTORY: Past Surgical History:  Procedure Laterality Date  . ANKLE SURGERY    . CHOLECYSTECTOMY  1980s?  . EUS N/A 11/19/2013   Procedure: ESOPHAGEAL ENDOSCOPIC ULTRASOUND (EUS) RADIAL;  Surgeon: Arta Silence, MD;  Location: [facility];  Service:  Endoscopy;  Laterality: N/A;  . LAPAROSCOPIC NISSEN FUNDOPLICATION  78/29/5621  . LAPAROSCOPIC PARAESOPHAGEAL HERNIA REPAIR  03/05/2008   Dr Leslie Benson  . TUBAL LIGATION  1990s?  Marland Kitchen VAGINAL HYSTERECTOMY  03/04/2001    SOCIAL HISTORY: Social History   Tobacco Use  . Smoking status: Never Smoker  . Smokeless tobacco: Never Used  Substance Use Topics  . Alcohol use: Yes    Alcohol/week: 0.0 standard drinks    Comment: wine occassionally  . Drug use: No    FAMILY HISTORY: Family History  Problem Relation Age of Onset  . Alcohol abuse Unknown   . Colon cancer Unknown   . Elevated Lipids Unknown   . Heart disease Unknown   . Anxiety disorder Mother   . Obesity Mother   . High Cholesterol Mother   . Thyroid cancer Mother   . Depression Mother   . High Cholesterol Father   . Heart disease Father   . Alcoholism Father     ROS: Review of Systems  Constitutional: Negative for weight loss.  Cardiovascular: Negative for palpitations.  Gastrointestinal: Positive for constipation. Negative for melena.       Negative for hematochezia  Endo/Heme/Allergies:       Negative for heat or cold intolerance    PHYSICAL EXAM: Blood pressure 97/66, pulse 97, temperature 97.8 F (36.6 C), temperature source Oral, height 5\' 4"  (1.626 m), weight 185 lb (83.9 kg), SpO2 (!) 66 %. Body mass index is 31.76 kg/m. Physical Exam Vitals signs reviewed.  Constitutional:      Appearance: Normal appearance. She is well-developed. She is obese.  Cardiovascular:     Rate and Rhythm: Normal rate.  Pulmonary:     Effort: Pulmonary effort is normal.  Musculoskeletal: Normal range of motion.  Skin:    General: Skin is warm and dry.  Neurological:     Mental Status: She is alert and oriented to person, place, and time.  Psychiatric:        Mood and Affect: Mood normal.        Behavior: Behavior normal.     RECENT LABS AND TESTS: BMET    Component Value Date/Time   NA 141 01/23/2018 1142   K  4.1 01/23/2018 1142   CL 98 01/23/2018 1142   CO2 26 01/23/2018 1142   GLUCOSE 98 01/23/2018 1142   GLUCOSE 96 04/05/2017 1140   BUN 11 01/23/2018 1142   CREATININE 1.07 (H) 01/23/2018 1142   CALCIUM 9.5 01/23/2018 1142   GFRNONAA 57 (L) 01/23/2018 1142   GFRAA 66 01/23/2018 1142   Lab Results  Component Value Date   HGBA1C 5.5 01/23/2018   HGBA1C 5.5 04/05/2017   HGBA1C 5.5 08/27/2015   HGBA1C  09/30/2009    5.4 (NOTE)  According to the ADA Clinical Practice Recommendations for 2011, when HbA1c is used as a screening test:   >=6.5%   Diagnostic of Diabetes Mellitus           (if abnormal result  is confirmed)  5.7-6.4%   Increased risk of developing Diabetes Mellitus  References:Diagnosis and Classification of Diabetes Mellitus,Diabetes WEXH,3716,96(VELFY 1):S62-S69 and Standards of Medical Care in         Diabetes - 2011,Diabetes BOFB,5102,58  (Suppl 1):S11-S61.   Lab Results  Component Value Date   INSULIN 10.4 01/23/2018   CBC    Component Value Date/Time   WBC 4.9 10/30/2017 1514   RBC 4.50 10/30/2017 1514   HGB 13.9 10/30/2017 1514   HCT 40.9 10/30/2017 1514   PLT 243.0 10/30/2017 1514   MCV 90.8 10/30/2017 1514   MCH 31.3 09/22/2015 2321   MCHC 34.0 10/30/2017 1514   RDW 14.0 10/30/2017 1514   LYMPHSABS 2.4 10/30/2017 1514   MONOABS 0.5 10/30/2017 1514   EOSABS 0.1 10/30/2017 1514   BASOSABS 0.0 10/30/2017 1514   Iron/TIBC/Ferritin/ %Sat    Component Value Date/Time   IRON 68 10/30/2017 1514   TIBC 319 10/30/2017 1514   FERRITIN 23 10/30/2017 1514   IRONPCTSAT 21 10/30/2017 1514   Lipid Panel     Component Value Date/Time   CHOL 195 02/15/2018 1042   TRIG 85.0 02/15/2018 1042   HDL 71.90 02/15/2018 1042   CHOLHDL 3 02/15/2018 1042   VLDL 17.0 02/15/2018 1042   LDLCALC 106 (H) 02/15/2018 1042   LDLDIRECT 168.0 04/05/2017 1140   Hepatic Function Panel     Component Value Date/Time    PROT 7.2 02/15/2018 1736   PROT 6.9 01/23/2018 1142   ALBUMIN 4.6 02/15/2018 1736   ALBUMIN 4.5 01/23/2018 1142   AST 16 02/15/2018 1736   ALT 20 02/15/2018 1736   ALKPHOS 103 02/15/2018 1736   BILITOT 0.3 02/15/2018 1736   BILITOT 0.3 01/23/2018 1142   BILIDIR 0.1 02/15/2018 1736   IBILI NOT CALCULATED 09/21/2013 2215      Component Value Date/Time   TSH 1.530 01/23/2018 1142   TSH 2.05 04/05/2017 1140   TSH 3.72 08/05/2015 0903    Ref. Range 10/30/2017 15:14  VITD Latest Ref Range: 30.00 - 100.00 ng/mL 41.24   ASSESSMENT AND PLAN: Other specified hypothyroidism  Insulin resistance  Other constipation  Class 1 obesity with serious comorbidity and body mass index (BMI) of 31.0 to 31.9 in adult, unspecified obesity type  PLAN:  Hypothyroidism Leslie Benson was informed of the importance of good thyroid control to help with weight loss efforts. She was also informed that supertheraputic thyroid levels are dangerous and will not improve weight loss results. Leslie Benson will continue her medications as prescribed and follow up as directed.  Insulin Resistance Leslie Benson will continue to work on weight loss, exercise, and decreasing simple carbohydrates in her diet to help decrease the risk of diabetes. We dicussed metformin including benefits and risks. She was informed that eating too many simple carbohydrates or too many calories at one sitting increases the likelihood of GI side effects. Leslie Benson will continue metformin for now and prescription was not written today. Leslie Benson agreed to follow up with [name] as directed to monitor her progress.  Constipation Leslie Benson was informed decrease bowel movement frequency is normal while losing weight, but stools should not be hard or painful. Leslie Benson agreed to begin Metamucil and increase her H2O intake and work on increasing her fiber intake. High fiber foods  were discussed today. Leslie Benson agrees to follow up as directed.  Obesity Leslie Benson is currently  in the action stage of change. As such, her goal is to continue with weight loss efforts She has agreed to follow the Category 2 plan Emmi will go to [employer] (treadmill, bike) for 30 minutes 2 to 3 days per week for weight loss and overall health benefits. We discussed the following Behavioral Modification Strategies today: increase H2O intake, keeping healthy foods in the home, increasing lean protein intake, decreasing simple carbohydrates , increasing vegetables and work on meal planning and easy cooking plans  Leslie Benson will keep certain foods out of the house. She will continue with meal planning.  Leslie Benson has agreed to follow up with our clinic in 2 weeks. She was informed of the importance of frequent follow up visits to maximize her success with intensive lifestyle modifications for her multiple health conditions.   OBESITY BEHAVIORAL INTERVENTION VISIT  Today's visit was # 6   Starting weight: 196 lbs Starting date: 01/23/2018 Today's weight : 185 lbs  Today's date: 04/03/2018 Total lbs lost to date: 11 At least 15 minutes were spent on discussing the following behavioral intervention visit.   ASK: We discussed the diagnosis of obesity with Leslie Benson today and Leslie Benson agreed to give [name] permission to discuss obesity behavioral modification therapy today.  ASSESS: Leslie Benson has the diagnosis of obesity and her BMI today is 31.74 Margaurite is in the action stage of change   ADVISE: Yulisa was educated on the multiple health risks of obesity as well as the benefit of weight loss to improve her health. She was advised of the need for long term treatment and the importance of lifestyle modifications to improve her current health and to decrease her risk of future health problems.  AGREE: Multiple dietary modification options and treatment options were discussed and  Shanera agreed to follow the recommendations documented in the above note.  ARRANGE: Jerney was  educated on the importance of frequent visits to treat obesity as outlined per CMS and USPSTF guidelines and agreed to schedule her next follow up appointment today.  Corey Skains, am acting as Location manager for [employer]. Owens Shark, DO  I have reviewed the above documentation for accuracy and completeness, and I agree with the above. -Jearld Lesch, DO

## 2018-04-05 HISTORY — PX: UPPER GASTROINTESTINAL ENDOSCOPY: SHX188

## 2018-04-09 ENCOUNTER — Encounter: Payer: Self-pay | Admitting: Internal Medicine

## 2018-04-23 ENCOUNTER — Encounter: Payer: Medicare HMO | Admitting: Internal Medicine

## 2018-04-23 ENCOUNTER — Encounter: Payer: Self-pay | Admitting: Internal Medicine

## 2018-04-23 ENCOUNTER — Ambulatory Visit (AMBULATORY_SURGERY_CENTER): Payer: Medicare HMO | Admitting: Internal Medicine

## 2018-04-23 VITALS — BP 113/82 | HR 72 | Temp 99.5°F | Resp 12 | Ht 64.0 in | Wt 188.0 lb

## 2018-04-23 DIAGNOSIS — R1310 Dysphagia, unspecified: Secondary | ICD-10-CM

## 2018-04-23 DIAGNOSIS — K2970 Gastritis, unspecified, without bleeding: Secondary | ICD-10-CM

## 2018-04-23 DIAGNOSIS — F909 Attention-deficit hyperactivity disorder, unspecified type: Secondary | ICD-10-CM | POA: Diagnosis not present

## 2018-04-23 DIAGNOSIS — K219 Gastro-esophageal reflux disease without esophagitis: Secondary | ICD-10-CM

## 2018-04-23 DIAGNOSIS — K222 Esophageal obstruction: Secondary | ICD-10-CM

## 2018-04-23 DIAGNOSIS — F419 Anxiety disorder, unspecified: Secondary | ICD-10-CM | POA: Diagnosis not present

## 2018-04-23 DIAGNOSIS — F410 Panic disorder [episodic paroxysmal anxiety] without agoraphobia: Secondary | ICD-10-CM | POA: Diagnosis not present

## 2018-04-23 DIAGNOSIS — K760 Fatty (change of) liver, not elsewhere classified: Secondary | ICD-10-CM | POA: Diagnosis not present

## 2018-04-23 MED ORDER — OMEPRAZOLE 20 MG PO CPDR: 20.0000 mg | DELAYED_RELEASE_CAPSULE | Freq: Every day | ORAL | 3 refills | Status: DC

## 2018-04-23 MED ORDER — SODIUM CHLORIDE 0.9 % IV SOLN: 500.0000 mL | Freq: Once | INTRAVENOUS | Status: DC

## 2018-04-23 NOTE — Op Note (Signed)
[facility] Patient Name: Leslie Benson Procedure Date: 04/23/2018 12:08 PM MRN: [id number] Endoscopist: Docia Chuck. Henrene Pastor , MD Age: 57 Referring MD:  Date of Birth: Dec 25, 1960 Gender: Female Account #: [id number] Procedure:                Upper GI endoscopy with [facility] dilation of the                            esophagus. 26 French Indications:              Dysphagia, Esophageal reflux Medicines:                Monitored Anesthesia Care Procedure:                Pre-Anesthesia Assessment:                           - Prior to the procedure, a History and Physical                            was performed, and patient medications and                            allergies were reviewed. The patient's tolerance of                            previous anesthesia was also reviewed. The risks                            and benefits of the procedure and the sedation                            options and risks were discussed with the patient.                            All questions were answered, and informed consent                            was obtained. Prior Anticoagulants: The patient has                            taken no previous anticoagulant or antiplatelet                            agents. ASA Grade Assessment: II - A patient with                            mild systemic disease. After reviewing the risks                            and benefits, the patient was deemed in                            satisfactory condition to undergo the procedure.  After obtaining informed consent, the endoscope was                            passed under direct vision. Throughout the                            procedure, the patient's blood pressure, pulse, and                            oxygen saturations were monitored continuously. The                            Endoscope was introduced through the mouth, and                            advanced to the second  part of duodenum. The upper                            GI endoscopy was accomplished without difficulty.                            The patient tolerated the procedure well. Scope In: Scope Out: Findings:                 One benign-appearing, intrinsic moderate stenosis                            was found 40 cm from the incisors. The scope was                            withdrawn. Dilation was performed with a Maloney                            dilator with no resistance at 34 Fr.                           The exam of the esophagus also revealed mild                            inflammation. The mucosal Z line was somewhat                            irregular but not meeting the definition for                            Barrett's esophagus.                           The stomach was normal save evidence of prior                            fundoplication with intact wrap.                           The examined duodenum  was normal.                           The cardia and gastric fundus were normal on                            retroflexion. Complications:            No immediate complications. Estimated Blood Loss:     Estimated blood loss: none. Impression:               1. GERD with mild esophagitis                           2. Esophageal stricture status post dilation                           3. Status post hiatal hernia repair with intact                            wrap. Recommendation:           1. Reflux precautions                           2. Prescribe omeprazole 20 mg daily; #30; 11 refills                           3. Post dilation diet                           4. Office follow-up with Dr. Henrene Pastor in 3 months to                            assess response to therapies. Docia Chuck. Henrene Pastor, MD 04/23/2018 12:24:20 PM This report has been signed electronically.

## 2018-04-23 NOTE — Progress Notes (Signed)
PT taken to PACU. Monitors in place. VSS. Report given to RN. 

## 2018-04-23 NOTE — Patient Instructions (Signed)
Follow dilation diet today.   YOU HAD AN ENDOSCOPIC PROCEDURE TODAY AT [facility]:   Refer to the procedure report that was given to you for any specific questions about what was found during the examination.  If the procedure report does not answer your questions, please call your gastroenterologist to clarify.  If you requested that your care partner not be given the details of your procedure findings, then the procedure report has been included in a sealed envelope for you to review at your convenience later.  YOU SHOULD EXPECT: Some feelings of bloating in the abdomen. Passage of more gas than usual.  Walking can help get rid of the air that was put into your GI tract during the procedure and reduce the bloating. If you had a lower endoscopy (such as a colonoscopy or flexible sigmoidoscopy) you may notice spotting of blood in your stool or on the toilet paper. If you underwent a bowel prep for your procedure, you may not have a normal bowel movement for a few days.  Please Note:  You might notice some irritation and congestion in your nose or some drainage.  This is from the oxygen used during your procedure.  There is no need for concern and it should clear up in a day or so.  SYMPTOMS TO REPORT IMMEDIATELY:    Following upper endoscopy (EGD)  Vomiting of blood or coffee ground material  New chest pain or pain under the shoulder blades  Painful or persistently difficult swallowing  New shortness of breath  Fever of 100F or higher  Black, tarry-looking stools  For urgent or emergent issues, a gastroenterologist can be reached at any hour by calling [phone].   DIET:  We do recommend a small meal at first, but then you may proceed to your regular diet.  Drink plenty of fluids but you should avoid alcoholic beverages for 24 hours.  ACTIVITY:  You should plan to take it easy for the rest of today and you should NOT DRIVE or use heavy machinery until tomorrow  (because of the sedation medicines used during the test).    FOLLOW UP: Our staff will call the number listed on your records the next business day following your procedure to check on you and address any questions or concerns that you may have regarding the information given to you following your procedure. If we do not reach you, we will leave a message.  However, if you are feeling well and you are not experiencing any problems, there is no need to return our call.  We will assume that you have returned to your regular daily activities without incident.  If any biopsies were taken you will be contacted by phone or by letter within the next 1-3 weeks.  Please call us at [phone] if you have not heard about the biopsies in 3 weeks.    SIGNATURES/CONFIDENTIALITY: You and/or your care partner have signed paperwork which will be entered into your electronic medical record.  These signatures attest to the fact that that the information above on your After Visit Summary has been reviewed and is understood.  Full responsibility of the confidentiality of this discharge information lies with you and/or your care-partner.

## 2018-04-23 NOTE — Progress Notes (Signed)
Called to room to assist during endoscopic procedure.  Patient ID and intended procedure confirmed with present staff. Received instructions for my participation in the procedure from the performing physician.  

## 2018-04-25 ENCOUNTER — Encounter (INDEPENDENT_AMBULATORY_CARE_PROVIDER_SITE_OTHER): Payer: Self-pay | Admitting: Bariatrics

## 2018-04-25 ENCOUNTER — Ambulatory Visit (INDEPENDENT_AMBULATORY_CARE_PROVIDER_SITE_OTHER): Payer: Medicare HMO | Admitting: Bariatrics

## 2018-04-25 ENCOUNTER — Telehealth: Payer: Self-pay

## 2018-04-25 VITALS — BP 100/69 | HR 85 | Temp 97.6°F | Ht 64.0 in | Wt 183.0 lb

## 2018-04-25 DIAGNOSIS — E8881 Metabolic syndrome: Secondary | ICD-10-CM | POA: Diagnosis not present

## 2018-04-25 DIAGNOSIS — Z6831 Body mass index (BMI) 31.0-31.9, adult: Secondary | ICD-10-CM | POA: Diagnosis not present

## 2018-04-25 DIAGNOSIS — E7849 Other hyperlipidemia: Secondary | ICD-10-CM | POA: Diagnosis not present

## 2018-04-25 DIAGNOSIS — E669 Obesity, unspecified: Secondary | ICD-10-CM | POA: Diagnosis not present

## 2018-04-25 NOTE — Progress Notes (Signed)
Ins res

## 2018-04-25 NOTE — Progress Notes (Signed)
Office: [phone]  /  Fax: [phone]   HPI:   Chief Complaint: OBESITY Leslie Benson is here to discuss her progress with her obesity treatment plan. She is on the Category 2 plan and is following her eating plan approximately 97 % of the time. She states she is walking 30 minutes 7 times per week. Leslie Benson is doing well overall. She watched her portion size. Her weight is 183 lb (83 kg) today and has had a weight loss of 2 pounds over a period of 3 weeks since her last visit. She has lost 13 lbs since starting treatment with [name].  Insulin Resistance Leslie Benson has a diagnosis of insulin resistance based on her elevated fasting insulin level >5. Her last insulin level was at 10.4 and A1c was at 5.5. Although Leslie Benson's blood glucose readings are still under good control, insulin resistance puts her at greater risk of metabolic syndrome and diabetes. She is taking metformin currently and continues to work on diet and exercise to decrease risk of diabetes.  Hyperlipidemia Leslie Benson has hyperlipidemia and she is currently taking Lipitor. She is currently well controlled. Leslie Benson has been trying to improve her cholesterol levels with intensive lifestyle modification including a low saturated fat diet, exercise and weight loss. She denies any chest pain, claudication or myalgias.  ASSESSMENT AND PLAN:  Insulin resistance  Hyperlipidemia associated with type 2 diabetes mellitus (HCC)  Class 1 obesity with serious comorbidity and body mass index (BMI) of 31.0 to 31.9 in adult, unspecified obesity type  PLAN:  Insulin Resistance Leslie Benson will continue to work on weight loss, exercise, and decreasing simple carbohydrates in her diet to help decrease the risk of diabetes. We dicussed metformin including benefits and risks. She was informed that eating too many simple carbohydrates or too many calories at one sitting increases the likelihood of GI side effects. Leslie Benson agreed to continue metformin for now  and prescription was not written today. Leslie Benson agreed to follow up with [name] as directed to monitor her progress.  Hyperlipidemia Leslie Benson was informed of the American Heart Association Guidelines emphasizing intensive lifestyle modifications as the first line treatment for hyperlipidemia. We discussed many lifestyle modifications today in depth, and Leslie Benson will continue to work on decreasing saturated fats such as fatty red meat, butter and many fried foods. She will also increase vegetables and lean protein in her diet and continue to work on exercise and weight loss efforts. Leslie Benson will continue her medications as prescribed and follow up at the agreed upon time.  Obesity Leslie Benson is currently in the action stage of change. As such, her goal is to continue with weight loss efforts She has agreed to follow the Category 2 plan with additional Category 1 and Category 2 breakfast options Leslie Benson has been instructed to work up to a goal of 150 minutes of combined cardio and strengthening exercise per week for weight loss and overall health benefits. We discussed the following Behavioral Modification Strategies today: increase H2O intake, better snacking choices, increasing lean protein intake, decreasing simple carbohydrates, increasing vegetables and work on meal planning and easy cooking plans Homemade seasonings and store bought seasonings handouts were provided to patient today.  Leslie Benson has agreed to follow up with our clinic in 2 weeks. She was informed of the importance of frequent follow up visits to maximize her success with intensive lifestyle modifications for her multiple health conditions.  ALLERGIES: Allergies  Allergen Reactions  . Codeine Nausea And Vomiting  . Compazine [Prochlorperazine Edisylate]     "  feels weird"  . Sulfa Antibiotics     Unknown reaction.    MEDICATIONS: Current Outpatient Medications on File Prior to Visit  Medication Sig Dispense Refill  . ALPRAZolam  (XANAX) 1 MG tablet Take 2 mg by mouth 3 (three) times daily as needed for anxiety (anxiety). .    . atorvastatin (LIPITOR) 20 MG tablet TAKE 1 TABLET (20 MG TOTAL) BY MOUTH DAILY. 90 tablet 0  . Cholecalciferol (PA VITAMIN D-3 GUMMY PO) Take 1 Dose by mouth daily.    . Cholecalciferol (VITAMIN D) 2000 units tablet Take 1 tablet (2,000 Units total) by mouth daily. 30 tablet   . gabapentin (NEURONTIN) 600 MG tablet Take 1 tablet (600 mg total) by mouth 3 (three) times daily. (Patient taking differently: Take 1,200 mg by mouth 3 (three) times daily. Dr. Toy Care) 90 tablet 0  . levothyroxine (SYNTHROID, LEVOTHROID) 25 MCG tablet TAKE 1 TABLET ONE TIME DAILY BEFORE BREAKFAST 90 tablet 2  . metFORMIN (GLUCOPHAGE) 500 MG tablet Take 1 tablet (500 mg total) by mouth 2 (two) times daily with a meal. 60 tablet 0  . omega-3 acid ethyl esters (LOVAZA) 1 G capsule Take 1 g by mouth 2 (two) times daily.    Marland Kitchen omeprazole (PRILOSEC) 20 MG capsule Take 1 capsule (20 mg total) by mouth daily. 90 capsule 3  . pantoprazole (PROTONIX) 40 MG tablet TAKE 1 TABLET EVERY DAY 90 tablet 0  . QUEtiapine (SEROQUEL) 400 MG tablet Take 800 mg by mouth at bedtime.    . traMADol (ULTRAM) 50 MG tablet Take 2 tablets (100 mg total) by mouth every 6 (six) hours as needed for pain. (Patient taking differently: Take 100 mg by mouth every 6 (six) hours as needed for moderate pain. Dr. Toy Care) 30 tablet 0  . venlafaxine XR (EFFEXOR-XR) 75 MG 24 hr capsule Take 75 mg by mouth 3 (three) times daily.      Current Facility-Administered Medications on File Prior to Visit  Medication Dose Route Frequency Provider Last Rate Last Dose  . 0.9 %  sodium chloride infusion  500 mL Intravenous Once Irene Shipper, MD        PAST MEDICAL HISTORY: Past Medical History:  Diagnosis Date  . ADHD   . Anxiety   . Barrett esophagus   . Breast nodule    benign  . Cervical arthritis 09/22/2013  . Depression   . Elevated liver enzymes   . Fatty liver   .  Fibromyalgia   . Fibromyalgia   . GERD (gastroesophageal reflux disease)   . H/O hiatal hernia   . Headache(784.0)    migraines  . High cholesterol   . Lymphocytic colitis   . Migraines   . Pain   . Panic attack   . Paraesophageal hiatal hernia    repaired 2009  . Spinal stenosis   . Thyroid disease   . Thyroid disease     PAST SURGICAL HISTORY: Past Surgical History:  Procedure Laterality Date  . ANKLE SURGERY    . CHOLECYSTECTOMY  1980s?  . EUS N/A 11/19/2013   Procedure: ESOPHAGEAL ENDOSCOPIC ULTRASOUND (EUS) RADIAL;  Surgeon: Arta Silence, MD;  Location: [facility];  Service: Endoscopy;  Laterality: N/A;  . LAPAROSCOPIC NISSEN FUNDOPLICATION  16/01/9603  . LAPAROSCOPIC PARAESOPHAGEAL HERNIA REPAIR  03/05/2008   Dr Johney Maine  . TUBAL LIGATION  1990s?  Marland Kitchen VAGINAL HYSTERECTOMY  03/04/2001    SOCIAL HISTORY: Social History   Tobacco Use  . Smoking status: Never Smoker  .  Smokeless tobacco: Never Used  Substance Use Topics  . Alcohol use: Yes    Alcohol/week: 0.0 standard drinks    Comment: wine occassionally  . Drug use: No    FAMILY HISTORY: Family History  Problem Relation Age of Onset  . Alcohol abuse Other   . Colon cancer Other   . Elevated Lipids Other   . Heart disease Other   . Anxiety disorder Mother   . Obesity Mother   . High Cholesterol Mother   . Thyroid cancer Mother   . Depression Mother   . High Cholesterol Father   . Heart disease Father   . Alcoholism Father   . Colon cancer Maternal Aunt   . Stomach cancer Neg Hx   . Esophageal cancer Neg Hx     ROS: Review of Systems  Constitutional: Positive for weight loss.  Cardiovascular: Negative for chest pain and claudication.  Musculoskeletal: Negative for myalgias.    PHYSICAL EXAM: Blood pressure 100/69, pulse 85, temperature 97.6 F (36.4 C), temperature source Oral, height 5\' 4"  (1.626 m), weight 183 lb (83 kg), SpO2 96 %. Body mass index is 31.41 kg/m. Physical Exam Vitals  signs reviewed.  Constitutional:      Appearance: Normal appearance. She is well-developed. She is obese.  Cardiovascular:     Rate and Rhythm: Normal rate.  Pulmonary:     Effort: Pulmonary effort is normal.  Musculoskeletal: Normal range of motion.  Skin:    General: Skin is warm and dry.  Neurological:     Mental Status: She is alert and oriented to person, place, and time.  Psychiatric:        Mood and Affect: Mood normal.        Behavior: Behavior normal.     RECENT LABS AND TESTS: BMET    Component Value Date/Time   NA 141 01/23/2018 1142   K 4.1 01/23/2018 1142   CL 98 01/23/2018 1142   CO2 26 01/23/2018 1142   GLUCOSE 98 01/23/2018 1142   GLUCOSE 96 04/05/2017 1140   BUN 11 01/23/2018 1142   CREATININE 1.07 (H) 01/23/2018 1142   CALCIUM 9.5 01/23/2018 1142   GFRNONAA 57 (L) 01/23/2018 1142   GFRAA 66 01/23/2018 1142   Lab Results  Component Value Date   HGBA1C 5.5 01/23/2018   HGBA1C 5.5 04/05/2017   HGBA1C 5.5 08/27/2015   HGBA1C  09/30/2009    5.4 (NOTE)                                                                       According to the ADA Clinical Practice Recommendations for 2011, when HbA1c is used as a screening test:   >=6.5%   Diagnostic of Diabetes Mellitus           (if abnormal result  is confirmed)  5.7-6.4%   Increased risk of developing Diabetes Mellitus  References:Diagnosis and Classification of Diabetes Mellitus,Diabetes GMWN,0272,53(GUYQI 1):S62-S69 and Standards of Medical Care in         Diabetes - 2011,Diabetes Care,2011,34  (Suppl 1):S11-S61.   Lab Results  Component Value Date   INSULIN 10.4 01/23/2018   CBC    Component Value Date/Time   WBC 4.9 10/30/2017 1514   RBC 4.50  10/30/2017 1514   HGB 13.9 10/30/2017 1514   HCT 40.9 10/30/2017 1514   PLT 243.0 10/30/2017 1514   MCV 90.8 10/30/2017 1514   MCH 31.3 09/22/2015 2321   MCHC 34.0 10/30/2017 1514   RDW 14.0 10/30/2017 1514   LYMPHSABS 2.4 10/30/2017 1514   MONOABS 0.5  10/30/2017 1514   EOSABS 0.1 10/30/2017 1514   BASOSABS 0.0 10/30/2017 1514   Iron/TIBC/Ferritin/ %Sat    Component Value Date/Time   IRON 68 10/30/2017 1514   TIBC 319 10/30/2017 1514   FERRITIN 23 10/30/2017 1514   IRONPCTSAT 21 10/30/2017 1514   Lipid Panel     Component Value Date/Time   CHOL 195 02/15/2018 1042   TRIG 85.0 02/15/2018 1042   HDL 71.90 02/15/2018 1042   CHOLHDL 3 02/15/2018 1042   VLDL 17.0 02/15/2018 1042   LDLCALC 106 (H) 02/15/2018 1042   LDLDIRECT 168.0 04/05/2017 1140   Hepatic Function Panel     Component Value Date/Time   PROT 7.2 02/15/2018 1736   PROT 6.9 01/23/2018 1142   ALBUMIN 4.6 02/15/2018 1736   ALBUMIN 4.5 01/23/2018 1142   AST 16 02/15/2018 1736   ALT 20 02/15/2018 1736   ALKPHOS 103 02/15/2018 1736   BILITOT 0.3 02/15/2018 1736   BILITOT 0.3 01/23/2018 1142   BILIDIR 0.1 02/15/2018 1736   IBILI NOT CALCULATED 09/21/2013 2215      Component Value Date/Time   TSH 1.530 01/23/2018 1142   TSH 2.05 04/05/2017 1140   TSH 3.72 08/05/2015 0903   Results for MACKENZYE, MACKEL (MRN [id number]) as of 04/25/2018 16:27  Ref. Range 10/30/2017 15:14  VITD Latest Ref Range: 30.00 - 100.00 ng/mL 41.24     OBESITY BEHAVIORAL INTERVENTION VISIT  Today's visit was # 7   Starting weight: 196 lbs Starting date: 01/23/2018 Today's weight : 183 lbs Today's date: 04/25/2018 Total lbs lost to date: 13 At least 15 minutes were spent on discussing the following behavioral intervention visit.   ASK: We discussed the diagnosis of obesity with [name] today and Skya agreed to give [name] permission to discuss obesity behavioral modification therapy today.  ASSESS: Kierrah has the diagnosis of obesity and her BMI today is 31.4 Brylynn is in the action stage of change   ADVISE: Beena was educated on the multiple health risks of obesity as well as the benefit of weight loss to improve her health. She was advised of the need for long  term treatment and the importance of lifestyle modifications to improve her current health and to decrease her risk of future health problems.  AGREE: Multiple dietary modification options and treatment options were discussed and  Adleigh agreed to follow the recommendations documented in the above note.  ARRANGE: Calyn was educated on the importance of frequent visits to treat obesity as outlined per CMS and USPSTF guidelines and agreed to schedule her next follow up appointment today.  Corey Skains, am acting as Location manager for [employer]. Owens Shark, DO  I have reviewed the above documentation for accuracy and completeness, and I agree with the above. -Jearld Lesch, DO

## 2018-04-25 NOTE — Telephone Encounter (Signed)
  Follow up Call-  Call back number 04/23/2018  Post procedure Call Back phone  # [phone]  Permission to leave phone message Yes  Some recent data might be hidden     Patient questions:  Do you have a fever, pain , or abdominal swelling? No. Pain Score  0 *  Have you tolerated food without any problems? Yes.    Have you been able to return to your normal activities? Yes.    Do you have any questions about your discharge instructions: Diet   No. Medications  No. Follow up visit  No.  Do you have questions or concerns about your Care? No.  Actions: * If pain score is 4 or above: No action needed, pain <4.

## 2018-05-03 ENCOUNTER — Ambulatory Visit (INDEPENDENT_AMBULATORY_CARE_PROVIDER_SITE_OTHER): Payer: Medicare HMO | Admitting: Adult Health

## 2018-05-03 ENCOUNTER — Encounter: Payer: Self-pay | Admitting: Adult Health

## 2018-05-03 VITALS — BP 100/70 | Temp 98.3°F | Ht 64.0 in | Wt 189.0 lb

## 2018-05-03 DIAGNOSIS — M797 Fibromyalgia: Secondary | ICD-10-CM

## 2018-05-03 DIAGNOSIS — E038 Other specified hypothyroidism: Secondary | ICD-10-CM | POA: Diagnosis not present

## 2018-05-03 DIAGNOSIS — E669 Obesity, unspecified: Secondary | ICD-10-CM | POA: Diagnosis not present

## 2018-05-03 DIAGNOSIS — E7849 Other hyperlipidemia: Secondary | ICD-10-CM | POA: Diagnosis not present

## 2018-05-03 DIAGNOSIS — Z0000 Encounter for general adult medical examination without abnormal findings: Secondary | ICD-10-CM | POA: Diagnosis not present

## 2018-05-03 DIAGNOSIS — Z6833 Body mass index (BMI) 33.0-33.9, adult: Secondary | ICD-10-CM | POA: Diagnosis not present

## 2018-05-03 NOTE — Progress Notes (Signed)
Subjective:    Patient ID: Leslie Benson, female    DOB: Jul 05, 1960, 58 y.o.   MRN: [id number]  HPI Patient presents for yearly preventative medicine examination. She is a pleasant 58 year old female who  has a past medical history of ADHD, Anxiety, Barrett esophagus, Breast nodule, Cervical arthritis (09/22/2013), Depression, Elevated liver enzymes, Fatty liver, Fibromyalgia, Fibromyalgia, GERD (gastroesophageal reflux disease), H/O hiatal hernia, Headache(784.0), High cholesterol, Lymphocytic colitis, Migraines, Pain, Panic attack, Paraesophageal hiatal hernia, Spinal stenosis, Thyroid disease, and Thyroid disease.   Anxiety and Depression - she takes Effexor and Seroquel - is followed by psychiatry   Fibromyalgia - she takes Tramadol PRN and Gabapentin   Hypothyroidism - takes Synthroid 25 mcg  Lab Results  Component Value Date   TSH 1.530 01/23/2018   Hyperlipidemia - takes Lipitor  Lab Results  Component Value Date   CHOL 195 02/15/2018   HDL 71.90 02/15/2018   LDLCALC 106 (H) 02/15/2018   LDLDIRECT 168.0 04/05/2017   TRIG 85.0 02/15/2018   CHOLHDL 3 02/15/2018   Insulin Resistance- Diagnosed by obesity clinic based on her elevated fasting insulin level greater than 5.  Her last A1c was 5.5.  She was started on metformin  Obesity -been working with the obesity clinic since October 2019 and has been able to lose approximately 13 pounds starting treatment  All immunizations and health maintenance protocols were reviewed with the patient and needed orders were placed.  Appropriate screening laboratory values were ordered for the patient including screening of hyperlipidemia, renal function and hepatic function.  Medication reconciliation,  past medical history, social history, problem list and allergies were reviewed in detail with the patient  Goals were established with regard to weight loss, exercise, and  diet in compliance with medications  She is up to date on  routine colonoscopies and mammogram   Review of Systems  Constitutional: Positive for fatigue (chronic ).  HENT: Negative.   Eyes: Negative.   Respiratory: Negative.   Gastrointestinal: Positive for abdominal pain (chronic ).  Endocrine: Negative.   Genitourinary: Negative.   Musculoskeletal: Negative.   Allergic/Immunologic: Negative.   Neurological: Negative.   Hematological: Negative.   Psychiatric/Behavioral: Positive for sleep disturbance (chronic ).  All other systems reviewed and are negative.  Past Medical History:  Diagnosis Date  . ADHD   . Anxiety   . Barrett esophagus   . Breast nodule    benign  . Cervical arthritis 09/22/2013  . Depression   . Elevated liver enzymes   . Fatty liver   . Fibromyalgia   . Fibromyalgia   . GERD (gastroesophageal reflux disease)   . H/O hiatal hernia   . Headache(784.0)    migraines  . High cholesterol   . Lymphocytic colitis   . Migraines   . Pain   . Panic attack   . Paraesophageal hiatal hernia    repaired 2009  . Spinal stenosis   . Thyroid disease   . Thyroid disease     Social History   Socioeconomic History  . Marital status: Married    Spouse name: Abe People  . Number of children: Not on file  . Years of education: Not on file  . Highest education level: Not on file  Occupational History  . Occupation: disabled  Social Needs  . Financial resource strain: Not on file  . Food insecurity:    Worry: Not on file    Inability: Not on file  . Transportation needs:  Medical: Not on file    Non-medical: Not on file  Tobacco Use  . Smoking status: Never Smoker  . Smokeless tobacco: Never Used  Substance and Sexual Activity  . Alcohol use: Yes    Alcohol/week: 0.0 standard drinks    Comment: wine occassionally  . Drug use: No  . Sexual activity: Yes    Birth control/protection: None    Comment: n/a partial hysterectomy  Lifestyle  . Physical activity:    Days per week: Not on file    Minutes per session:  Not on file  . Stress: Not on file  Relationships  . Social connections:    Talks on phone: Not on file    Gets together: Not on file    Attends religious service: Not on file    Active member of club or organization: Not on file    Attends meetings of clubs or organizations: Not on file    Relationship status: Not on file  . Intimate partner violence:    Fear of current or ex partner: Not on file    Emotionally abused: Not on file    Physically abused: Not on file    Forced sexual activity: Not on file  Other Topics Concern  . Not on file  Social History Narrative   Married    Lived in [place].  Moved to [place] in early 2000s   She is on disability for anxiety     Past Surgical History:  Procedure Laterality Date  . ANKLE SURGERY    . CHOLECYSTECTOMY  1980s?  . EUS N/A 11/19/2013   Procedure: ESOPHAGEAL ENDOSCOPIC ULTRASOUND (EUS) RADIAL;  Surgeon: Arta Silence, MD;  Location: [facility];  Service: Endoscopy;  Laterality: N/A;  . LAPAROSCOPIC NISSEN FUNDOPLICATION  61/95/0932  . LAPAROSCOPIC PARAESOPHAGEAL HERNIA REPAIR  03/05/2008   Dr Johney Maine  . TUBAL LIGATION  1990s?  Marland Kitchen VAGINAL HYSTERECTOMY  03/04/2001    Family History  Problem Relation Age of Onset  . Alcohol abuse Other   . Colon cancer Other   . Elevated Lipids Other   . Heart disease Other   . Anxiety disorder Mother   . Obesity Mother   . High Cholesterol Mother   . Thyroid cancer Mother   . Depression Mother   . High Cholesterol Father   . Heart disease Father   . Alcoholism Father   . Colon cancer Maternal Aunt   . Stomach cancer Neg Hx   . Esophageal cancer Neg Hx     Allergies  Allergen Reactions  . Codeine Nausea And Vomiting  . Compazine [Prochlorperazine Edisylate]     "feels weird"  . Sulfa Antibiotics     Unknown reaction.    Current Outpatient Medications on File Prior to Visit  Medication Sig Dispense Refill  . ALPRAZolam (XANAX) 1 MG tablet Take 2 mg by mouth 3 (three) times  daily as needed for anxiety (anxiety). .    . atorvastatin (LIPITOR) 20 MG tablet TAKE 1 TABLET (20 MG TOTAL) BY MOUTH DAILY. 90 tablet 0  . Cholecalciferol (PA VITAMIN D-3 GUMMY PO) Take 1 Dose by mouth daily.    . Cholecalciferol (VITAMIN D) 2000 units tablet Take 1 tablet (2,000 Units total) by mouth daily. 30 tablet   . gabapentin (NEURONTIN) 600 MG tablet Take 1 tablet (600 mg total) by mouth 3 (three) times daily. (Patient taking differently: Take 1,200 mg by mouth 3 (three) times daily. Dr. Toy Care) 90 tablet 0  . levothyroxine (SYNTHROID, LEVOTHROID) 25  MCG tablet TAKE 1 TABLET ONE TIME DAILY BEFORE BREAKFAST 90 tablet 2  . metFORMIN (GLUCOPHAGE) 500 MG tablet Take 1 tablet (500 mg total) by mouth 2 (two) times daily with a meal. 60 tablet 0  . omega-3 acid ethyl esters (LOVAZA) 1 G capsule Take 1 g by mouth 2 (two) times daily.    Marland Kitchen omeprazole (PRILOSEC) 20 MG capsule Take 1 capsule (20 mg total) by mouth daily. 90 capsule 3  . QUEtiapine (SEROQUEL) 400 MG tablet Take 800 mg by mouth at bedtime.    . traMADol (ULTRAM) 50 MG tablet Take 2 tablets (100 mg total) by mouth every 6 (six) hours as needed for pain. (Patient taking differently: Take 100 mg by mouth every 6 (six) hours as needed for moderate pain. Dr. Toy Care) 30 tablet 0  . venlafaxine XR (EFFEXOR-XR) 75 MG 24 hr capsule Take 75 mg by mouth 3 (three) times daily.      Current Facility-Administered Medications on File Prior to Visit  Medication Dose Route Frequency Provider Last Rate Last Dose  . 0.9 %  sodium chloride infusion  500 mL Intravenous Once Irene Shipper, MD        BP 100/70   Temp 98.3 F (36.8 C)   Ht 5\' 4"  (1.626 m)   Wt 189 lb (85.7 kg)   BMI 32.44 kg/m       Objective:   Physical Exam Vitals signs and nursing note reviewed.  Constitutional:      Appearance: Normal appearance. She is obese.  HENT:     Head: Normocephalic and atraumatic.     Right Ear: Tympanic membrane, ear canal and external ear normal.  There is no impacted cerumen.     Left Ear: Tympanic membrane, ear canal and external ear normal. There is no impacted cerumen.     Nose: Nose normal. No congestion or rhinorrhea.     Mouth/Throat:     Mouth: Mucous membranes are moist.     Pharynx: Oropharynx is clear.  Eyes:     Extraocular Movements: Extraocular movements intact.     Pupils: Pupils are equal, round, and reactive to light.  Neck:     Musculoskeletal: Normal range of motion and neck supple.  Cardiovascular:     Rate and Rhythm: Normal rate and regular rhythm.     Pulses: Normal pulses.     Heart sounds: Normal heart sounds.  Pulmonary:     Effort: Pulmonary effort is normal. No respiratory distress.     Breath sounds: Normal breath sounds. No stridor. No wheezing, rhonchi or rales.  Chest:     Chest wall: No tenderness.  Abdominal:     General: Bowel sounds are normal. There is no distension.     Palpations: Abdomen is soft. There is no mass.     Tenderness: There is abdominal tenderness in the epigastric area. There is no guarding or rebound.     Hernia: No hernia is present.  Musculoskeletal: Normal range of motion.        General: No swelling, tenderness, deformity or signs of injury.     Right lower leg: No edema.     Left lower leg: No edema.  Skin:    General: Skin is warm and dry.     Capillary Refill: Capillary refill takes less than 2 seconds.     Coloration: Skin is not jaundiced or pale.     Findings: No bruising, erythema, lesion or rash.  Neurological:  General: No focal deficit present.     Mental Status: She is alert. She is disoriented.     Cranial Nerves: No cranial nerve deficit.     Sensory: No sensory deficit.     Motor: No weakness.     Coordination: Coordination normal.     Gait: Gait normal.     Deep Tendon Reflexes: Reflexes normal.  Psychiatric:        Mood and Affect: Mood normal.        Behavior: Behavior normal.        Thought Content: Thought content normal.         Judgment: Judgment normal.       Assessment & Plan:  1. Routine general medical examination at a health care facility - had full workup done two months ago at weight loss clinic. We reviewed all of her labs in detail.  - Continue to diet and exercise - Follow up in 1 year for CPE or sooner if needed  2. Class 1 obesity with serious comorbidity and body mass index (BMI) of 33.0 to 33.9 in adult, unspecified obesity type - Congratulated on weight loss and encouraged to continue to diet and exercise   3. Other specified hypothyroidism - Continue with synthroid 25 mcg   4. Fibromyalgia - Continue with Tramadol and Gabapentin   5. Other hyperlipidemia - Continue with lipitor   [name], NP

## 2018-05-08 ENCOUNTER — Ambulatory Visit (INDEPENDENT_AMBULATORY_CARE_PROVIDER_SITE_OTHER): Payer: Medicare HMO | Admitting: Bariatrics

## 2018-05-14 ENCOUNTER — Ambulatory Visit (INDEPENDENT_AMBULATORY_CARE_PROVIDER_SITE_OTHER): Payer: Medicare HMO | Admitting: Bariatrics

## 2018-05-14 ENCOUNTER — Encounter (INDEPENDENT_AMBULATORY_CARE_PROVIDER_SITE_OTHER): Payer: Self-pay | Admitting: Bariatrics

## 2018-05-14 VITALS — BP 117/80 | HR 86 | Temp 97.6°F | Ht 64.0 in | Wt 187.0 lb

## 2018-05-14 DIAGNOSIS — E7849 Other hyperlipidemia: Secondary | ICD-10-CM

## 2018-05-14 DIAGNOSIS — E669 Obesity, unspecified: Secondary | ICD-10-CM

## 2018-05-14 DIAGNOSIS — K5909 Other constipation: Secondary | ICD-10-CM

## 2018-05-14 DIAGNOSIS — Z6832 Body mass index (BMI) 32.0-32.9, adult: Secondary | ICD-10-CM

## 2018-05-14 DIAGNOSIS — E8881 Metabolic syndrome: Secondary | ICD-10-CM | POA: Diagnosis not present

## 2018-05-15 NOTE — Progress Notes (Signed)
Office: [phone]  /  Fax: [phone]   HPI:   Chief Complaint: OBESITY Leslie Benson is here to discuss her progress with her obesity treatment plan. She is on the Category 2 plan with Category 1 and Category 2 breakfast options and is following her eating plan approximately 75 % of the time. She states she is exercising 0 minutes 0 times per week. [name] has been sick and states that she got off the plan, but she is ready to get back on the plan. She gained 2.2 pounds of water weight. Her weight is 187 lb (84.8 kg) today and has had a weight gain of 4 pounds over a period of 2 to 3 weeks since her last visit. She has lost 9 lbs since starting treatment with [name].  Insulin Resistance [name] has a diagnosis of insulin resistance based on her elevated fasting insulin level >5. Although [name]'s blood glucose readings are still under good control, insulin resistance puts her at greater risk of metabolic syndrome and diabetes. Her last A1c was at 5.5 and last insulin level was at 10.4 She is taking metformin currently and continues to work on diet and exercise to decrease risk of diabetes.  Hyperlipidemia [name] has hyperlipidemia and she is currently taking Lovaza. She has been trying to improve her cholesterol levels with intensive lifestyle modification including a low saturated fat diet, exercise and weight loss. She denies any chest pain, claudication or myalgias.  Constipation [name] notes constipation for the last few weeks, worse since attempting weight loss. She states BM are less frequent and are hard and painful. She is able to evacuate. She denies abdominal pain.   ASSESSMENT AND PLAN:  Insulin resistance  Other hyperlipidemia  Other constipation  Class 1 obesity with serious comorbidity and body mass index (BMI) of 32.0 to 32.9 in adult, unspecified obesity type  PLAN:  Insulin Resistance [name] will continue to work on weight loss, exercise, and decreasing simple  carbohydrates in her diet to help decrease the risk of diabetes. We dicussed metformin including benefits and risks. She was informed that eating too many simple carbohydrates or too many calories at one sitting increases the likelihood of GI side effects. [name] will continue metformin for now and prescription was not written today. [name] agreed to follow up with [name] as directed to monitor her progress.  Hyperlipidemia [name] was informed of the American Heart Association Guidelines emphasizing intensive lifestyle modifications as the first line treatment for hyperlipidemia. We discussed many lifestyle modifications today in depth, and [name] will continue to work on decreasing saturated fats such as fatty red meat, butter and many fried foods. She will also increase vegetables and lean protein in her diet and continue to work on exercise and weight loss efforts. We will check lipids at the next visit.  Constipation [name] was informed decrease bowel movement frequency is normal while losing weight, but stools should not be hard or painful. She was advised to increase her H20 intake and work on increasing her fiber intake. High fiber foods were discussed today. [name] can take Miralax or Clear Lax and/or Metamucil. She agrees to follow up with our clinic in 2 weeks.  Obesity [name] is currently in the action stage of change. As such, her goal is to continue with weight loss efforts She has agreed to follow the Category 2 plan with additional Category 1 and Category 2 breakfast options [name] has been instructed to work up to a goal of 150 minutes of combined cardio and  strengthening exercise per week for weight loss and overall health benefits. We discussed the following Behavioral Modification Strategies today: increase H2O intake, no skipping meals, increasing lean protein intake, increasing vegetables, decreasing sodium intake and work on meal planning and easy cooking plans  [name] has  agreed to follow up with our clinic in 2 weeks fasting. She was informed of the importance of frequent follow up visits to maximize her success with intensive lifestyle modifications for her multiple health conditions.  ALLERGIES: Allergies  Allergen Reactions  . Codeine Nausea And Vomiting  . Compazine [Prochlorperazine Edisylate]     "feels weird"  . Sulfa Antibiotics     Unknown reaction.    MEDICATIONS: Current Outpatient Medications on File Prior to Visit  Medication Sig Dispense Refill  . ALPRAZolam (XANAX) 1 MG tablet Take 2 mg by mouth 3 (three) times daily as needed for anxiety (anxiety). .    . atorvastatin (LIPITOR) 20 MG tablet TAKE 1 TABLET (20 MG TOTAL) BY MOUTH DAILY. 90 tablet 0  . Cholecalciferol (PA VITAMIN D-3 GUMMY PO) Take 1 Dose by mouth daily.    . Cholecalciferol (VITAMIN D) 2000 units tablet Take 1 tablet (2,000 Units total) by mouth daily. 30 tablet   . gabapentin (NEURONTIN) 600 MG tablet Take 1 tablet (600 mg total) by mouth 3 (three) times daily. (Patient taking differently: Take 1,200 mg by mouth 3 (three) times daily. Dr. Toy Care) 90 tablet 0  . levothyroxine (SYNTHROID, LEVOTHROID) 25 MCG tablet TAKE 1 TABLET ONE TIME DAILY BEFORE BREAKFAST 90 tablet 2  . metFORMIN (GLUCOPHAGE) 500 MG tablet Take 1 tablet (500 mg total) by mouth 2 (two) times daily with a meal. 60 tablet 0  . omega-3 acid ethyl esters (LOVAZA) 1 G capsule Take 1 g by mouth 2 (two) times daily.    Marland Kitchen omeprazole (PRILOSEC) 20 MG capsule Take 1 capsule (20 mg total) by mouth daily. 90 capsule 3  . QUEtiapine (SEROQUEL) 400 MG tablet Take 800 mg by mouth at bedtime.    . traMADol (ULTRAM) 50 MG tablet Take 2 tablets (100 mg total) by mouth every 6 (six) hours as needed for pain. (Patient taking differently: Take 100 mg by mouth every 6 (six) hours as needed for moderate pain. Dr. Toy Care) 30 tablet 0  . venlafaxine XR (EFFEXOR-XR) 75 MG 24 hr capsule Take 75 mg by mouth 3 (three) times daily.       Current Facility-Administered Medications on File Prior to Visit  Medication Dose Route Frequency Provider Last Rate Last Dose  . 0.9 %  sodium chloride infusion  500 mL Intravenous Once Irene Shipper, MD        PAST MEDICAL HISTORY: Past Medical History:  Diagnosis Date  . ADHD   . Anxiety   . Barrett esophagus   . Breast nodule    benign  . Cervical arthritis 09/22/2013  . Depression   . Elevated liver enzymes   . Fatty liver   . Fibromyalgia   . Fibromyalgia   . GERD (gastroesophageal reflux disease)   . H/O hiatal hernia   . Headache(784.0)    migraines  . High cholesterol   . Lymphocytic colitis   . Migraines   . Pain   . Panic attack   . Paraesophageal hiatal hernia    repaired 2009  . Spinal stenosis   . Thyroid disease   . Thyroid disease     PAST SURGICAL HISTORY: Past Surgical History:  Procedure Laterality Date  .  ANKLE SURGERY    . CHOLECYSTECTOMY  1980s?  . EUS N/A 11/19/2013   Procedure: ESOPHAGEAL ENDOSCOPIC ULTRASOUND (EUS) RADIAL;  Surgeon: Arta Silence, MD;  Location: [facility];  Service: Endoscopy;  Laterality: N/A;  . LAPAROSCOPIC NISSEN FUNDOPLICATION  71/[id number]/5809  . LAPAROSCOPIC PARAESOPHAGEAL HERNIA REPAIR  03/05/2008   Dr Johney Maine  . TUBAL LIGATION  1990s?  Marland Kitchen VAGINAL HYSTERECTOMY  03/04/2001    SOCIAL HISTORY: Social History   Tobacco Use  . Smoking status: Never Smoker  . Smokeless tobacco: Never Used  Substance Use Topics  . Alcohol use: Yes    Alcohol/week: 0.0 standard drinks    Comment: wine occassionally  . Drug use: No    FAMILY HISTORY: Family History  Problem Relation Age of Onset  . Alcohol abuse Other   . Colon cancer Other   . Elevated Lipids Other   . Heart disease Other   . Anxiety disorder Mother   . Obesity Mother   . High Cholesterol Mother   . Thyroid cancer Mother   . Depression Mother   . High Cholesterol Father   . Heart disease Father   . Alcoholism Father   . Colon cancer Maternal Aunt   .  Stomach cancer Neg Hx   . Esophageal cancer Neg Hx     ROS: Review of Systems  Constitutional: Negative for weight loss.  Cardiovascular: Negative for chest pain and claudication.  Gastrointestinal: Positive for constipation. Negative for abdominal pain.  Musculoskeletal: Negative for myalgias.    PHYSICAL EXAM: Blood pressure 117/80, pulse 86, temperature 97.6 F (36.4 C), temperature source Oral, height 5\' 4"  (1.626 m), weight 187 lb (84.8 kg), SpO2 97 %. Body mass index is 32.1 kg/m. Physical Exam Vitals signs reviewed.  Constitutional:      Appearance: Normal appearance. She is well-developed. She is obese.  Cardiovascular:     Rate and Rhythm: Normal rate.  Pulmonary:     Effort: Pulmonary effort is normal.  Musculoskeletal: Normal range of motion.  Skin:    General: Skin is warm and dry.  Neurological:     Mental Status: She is alert and oriented to person, place, and time.  Psychiatric:        Mood and Affect: Mood normal.        Behavior: Behavior normal.     RECENT LABS AND TESTS: BMET    Component Value Date/Time   NA 141 01/23/2018 1142   K 4.1 01/23/2018 1142   CL 98 01/23/2018 1142   CO2 26 01/23/2018 1142   GLUCOSE 98 01/23/2018 1142   GLUCOSE 96 04/05/2017 1140   BUN 11 01/23/2018 1142   CREATININE 1.07 (H) 01/23/2018 1142   CALCIUM 9.5 01/23/2018 1142   GFRNONAA 57 (L) 01/23/2018 1142   GFRAA 66 01/23/2018 1142   Lab Results  Component Value Date   HGBA1C 5.5 01/23/2018   HGBA1C 5.5 04/05/2017   HGBA1C 5.5 08/27/2015   HGBA1C  09/30/2009    5.4 (NOTE)                                                                       According to the ADA Clinical Practice Recommendations for 2011, when HbA1c is used as a screening test:   >=  6.5%   Diagnostic of Diabetes Mellitus           (if abnormal result  is confirmed)  5.7-6.4%   Increased risk of developing Diabetes Mellitus  References:Diagnosis and Classification of Diabetes Mellitus,Diabetes  ZJIR,6789,38(BOFBP 1):S62-S69 and Standards of Medical Care in         Diabetes - 2011,Diabetes Care,2011,34  (Suppl 1):S11-S61.   Lab Results  Component Value Date   INSULIN 10.4 01/23/2018   CBC    Component Value Date/Time   WBC 4.9 10/30/2017 1514   RBC 4.50 10/30/2017 1514   HGB 13.9 10/30/2017 1514   HCT 40.9 10/30/2017 1514   PLT 243.0 10/30/2017 1514   MCV 90.8 10/30/2017 1514   MCH 31.3 09/22/2015 2321   MCHC 34.0 10/30/2017 1514   RDW 14.0 10/30/2017 1514   LYMPHSABS 2.4 10/30/2017 1514   MONOABS 0.5 10/30/2017 1514   EOSABS 0.1 10/30/2017 1514   BASOSABS 0.0 10/30/2017 1514   Iron/TIBC/Ferritin/ %Sat    Component Value Date/Time   IRON 68 10/30/2017 1514   TIBC 319 10/30/2017 1514   FERRITIN 23 10/30/2017 1514   IRONPCTSAT 21 10/30/2017 1514   Lipid Panel     Component Value Date/Time   CHOL 195 02/15/2018 1042   TRIG 85.0 02/15/2018 1042   HDL 71.90 02/15/2018 1042   CHOLHDL 3 02/15/2018 1042   VLDL 17.0 02/15/2018 1042   LDLCALC 106 (H) 02/15/2018 1042   LDLDIRECT 168.0 04/05/2017 1140   Hepatic Function Panel     Component Value Date/Time   PROT 7.2 02/15/2018 1736   PROT 6.9 01/23/2018 1142   ALBUMIN 4.6 02/15/2018 1736   ALBUMIN 4.5 01/23/2018 1142   AST 16 02/15/2018 1736   ALT 20 02/15/2018 1736   ALKPHOS 103 02/15/2018 1736   BILITOT 0.3 02/15/2018 1736   BILITOT 0.3 01/23/2018 1142   BILIDIR 0.1 02/15/2018 1736   IBILI NOT CALCULATED 09/21/2013 2215      Component Value Date/Time   TSH 1.530 01/23/2018 1142   TSH 2.05 04/05/2017 1140   TSH 3.72 08/05/2015 0903     Ref. Range 10/30/2017 15:14  VITD Latest Ref Range: 30.00 - 100.00 ng/mL 41.24     OBESITY BEHAVIORAL INTERVENTION VISIT  Today's visit was # 8   Starting weight: 196 lbs Starting date: 01/23/2018 Today's weight : 187 lbs Today's date: 05/14/2018 Total lbs lost to date: 9 At least 15 minutes were spent on discussing the following behavioral intervention  visit.   ASK: We discussed the diagnosis of obesity with [name] today and Shavonte agreed to give [name] permission to discuss obesity behavioral modification therapy today.  ASSESS: Suriya has the diagnosis of obesity and her BMI today is 32.08 Simranjit is in the action stage of change   ADVISE: Clorinda was educated on the multiple health risks of obesity as well as the benefit of weight loss to improve her health. She was advised of the need for long term treatment and the importance of lifestyle modifications to improve her current health and to decrease her risk of future health problems.  AGREE: Multiple dietary modification options and treatment options were discussed and  Judye agreed to follow the recommendations documented in the above note.  ARRANGE: Shamya was educated on the importance of frequent visits to treat obesity as outlined per CMS and USPSTF guidelines and agreed to schedule her next follow up appointment today.  Corey Skains, am acting as Location manager for [employer]. Owens Shark, DO  I have reviewed the above documentation for accuracy and completeness, and I agree with the above. -Jearld Lesch, DO

## 2018-05-29 ENCOUNTER — Other Ambulatory Visit: Payer: Self-pay | Admitting: Adult Health

## 2018-05-29 ENCOUNTER — Encounter (INDEPENDENT_AMBULATORY_CARE_PROVIDER_SITE_OTHER): Payer: Self-pay | Admitting: Bariatrics

## 2018-05-29 ENCOUNTER — Ambulatory Visit (INDEPENDENT_AMBULATORY_CARE_PROVIDER_SITE_OTHER): Payer: Medicare HMO | Admitting: Bariatrics

## 2018-05-29 VITALS — BP 107/68 | HR 67 | Temp 97.7°F | Ht 64.0 in | Wt 183.0 lb

## 2018-05-29 DIAGNOSIS — Z6831 Body mass index (BMI) 31.0-31.9, adult: Secondary | ICD-10-CM | POA: Diagnosis not present

## 2018-05-29 DIAGNOSIS — E669 Obesity, unspecified: Secondary | ICD-10-CM

## 2018-05-29 DIAGNOSIS — E8881 Metabolic syndrome: Secondary | ICD-10-CM | POA: Diagnosis not present

## 2018-05-29 DIAGNOSIS — E559 Vitamin D deficiency, unspecified: Secondary | ICD-10-CM

## 2018-05-29 DIAGNOSIS — E7849 Other hyperlipidemia: Secondary | ICD-10-CM

## 2018-05-30 LAB — COMPREHENSIVE METABOLIC PANEL
ALT: 21 IU/L (ref 0–32)
AST: 21 IU/L (ref 0–40)
Albumin/Globulin Ratio: 2.2 (ref 1.2–2.2)
Albumin: 4.7 g/dL (ref 3.8–4.9)
Alkaline Phosphatase: 115 IU/L (ref 39–117)
BILIRUBIN TOTAL: 0.3 mg/dL (ref 0.0–1.2)
BUN/Creatinine Ratio: 16 (ref 9–23)
BUN: 15 mg/dL (ref 6–24)
CHLORIDE: 100 mmol/L (ref 96–106)
CO2: 23 mmol/L (ref 20–29)
Calcium: 9.5 mg/dL (ref 8.7–10.2)
Creatinine, Ser: 0.93 mg/dL (ref 0.57–1.00)
GFR calc Af Amer: 78 mL/min/{1.73_m2} (ref >59)
GFR calc non Af Amer: 67 mL/min/{1.73_m2} (ref >59)
Globulin, Total: 2.1 g/dL (ref 1.5–4.5)
Glucose: 84 mg/dL (ref 65–99)
Potassium: 4.2 mmol/L (ref 3.5–5.2)
Sodium: 141 mmol/L (ref 134–144)
Total Protein: 6.8 g/dL (ref 6.0–8.5)

## 2018-05-30 LAB — VITAMIN D 25 HYDROXY (VIT D DEFICIENCY, FRACTURES): Vit D, 25-Hydroxy: 45.4 ng/mL (ref 30.0–100.0)

## 2018-05-30 LAB — HEMOGLOBIN A1C
Est. average glucose Bld gHb Est-mCnc: 105 mg/dL
Hgb A1c MFr Bld: 5.3 % (ref 4.8–5.6)

## 2018-05-30 LAB — INSULIN, RANDOM: INSULIN: 6.2 u[IU]/mL (ref 2.6–24.9)

## 2018-05-30 NOTE — Telephone Encounter (Signed)
Pantoprazole not filled by [facility].  Atorvastatin sent to the pharmacy.  Nothing further needed.

## 2018-05-30 NOTE — Progress Notes (Signed)
Office: [phone]  /  Fax: [phone]   HPI:   Chief Complaint: OBESITY Leslie Benson is here to discuss her progress with her obesity treatment plan. She is on the Category 2 plan with Category 1 and Category 2 breakfast options and is following her eating plan approximately 80 % of the time. She states she is exercising on the stationary bike for 60 minutes 4 times per week. Leslie Benson is doing well overall. She is sticking to the diet. Leslie Benson is getting adequate protein and water. Her weight is 183 lb (83 kg) today and has had a weight loss of 4 pounds over a period of 2 weeks since her last visit. She has lost 13 lbs since starting treatment with [name].  Insulin Resistance Leslie Benson has a diagnosis of insulin resistance based on her elevated fasting insulin level >5. Although Leslie Benson's blood glucose readings are still under good control, insulin resistance puts her at greater risk of metabolic syndrome and diabetes. Her last A1c was at 5.5 and last insulin level was at 10.4 She is taking metformin currently and continues to work on diet and exercise to decrease risk of diabetes.  Hyperlipidemia Leslie Benson has hyperlipidemia and she is currently taking Lovaza. She has been trying to improve her cholesterol levels with intensive lifestyle modification including a low saturated fat diet, exercise and weight loss. She denies myalgias.  Vitamin D deficiency Leslie Benson has a diagnosis of vitamin D deficiency. She is currently taking vit D and denies nausea, vomiting or muscle weakness.  ASSESSMENT AND PLAN:  Insulin resistance - Plan: Comprehensive metabolic panel, Hemoglobin A1c, Insulin, random  Other hyperlipidemia  Vitamin D deficiency - Plan: VITAMIN D 25 Hydroxy (Vit-D Deficiency, Fractures)  Class 1 obesity with serious comorbidity and body mass index (BMI) of 31.0 to 31.9 in adult, unspecified obesity type  PLAN:  Insulin Resistance Leslie Benson will continue to work on weight loss, exercise,  and decreasing simple carbohydrates in her diet to help decrease the risk of diabetes. We dicussed metformin including benefits and risks. She was informed that eating too many simple carbohydrates or too many calories at one sitting increases the likelihood of GI side effects. Leslie Benson agreed to continue metformin for now and prescription was not written today. Leslie Benson agreed to follow up with [name] as directed to monitor her progress.  Hyperlipidemia Leslie Benson was informed of the American Heart Association Guidelines emphasizing intensive lifestyle modifications as the first line treatment for hyperlipidemia. We discussed many lifestyle modifications today in depth, and Leslie Benson will continue to work on decreasing saturated fats such as fatty red meat, butter and many fried foods. She will also increase vegetables and lean protein in her diet and continue to work on exercise and weight loss efforts. Leslie Benson will continue her medications and follow up at the agreed upon time.   Vitamin D Deficiency Leslie Benson was informed that low vitamin D levels contributes to fatigue and are associated with obesity, breast, and colon cancer. She will continue vitamin D and will follow up for routine testing of vitamin D, at least 2-3 times per year. She was informed of the risk of over-replacement of vitamin D and agrees to not increase her dose unless she discusses this with [name] first. We will check vitamin D level today and [name] agrees to follow up as directed.  Obesity Leslie Benson is currently in the action stage of change. As such, her goal is to continue with weight loss efforts She has agreed to follow the Category 2 plan Leslie Benson has  been instructed to work up to a goal of 150 minutes of combined cardio and strengthening exercise per week for weight loss and overall health benefits. We discussed the following Behavioral Modification Strategies today: increase H2O intake, keeping healthy foods in the home, increasing lean  protein intake, decreasing simple carbohydrates, increasing vegetables and work on meal planning and easy cooking plans  Leslie Benson has agreed to follow up with our clinic in 2 weeks. She was informed of the importance of frequent follow up visits to maximize her success with intensive lifestyle modifications for her multiple health conditions.  ALLERGIES: Allergies  Allergen Reactions  . Codeine Nausea And Vomiting  . Compazine [Prochlorperazine Edisylate]     "feels weird"  . Sulfa Antibiotics     Unknown reaction.    MEDICATIONS: Current Outpatient Medications on File Prior to Visit  Medication Sig Dispense Refill  . ALPRAZolam (XANAX) 1 MG tablet Take 2 mg by mouth 3 (three) times daily as needed for anxiety (anxiety). .    . Cholecalciferol (PA VITAMIN D-3 GUMMY PO) Take 1 Dose by mouth daily.    . Cholecalciferol (VITAMIN D) 2000 units tablet Take 1 tablet (2,000 Units total) by mouth daily. 30 tablet   . gabapentin (NEURONTIN) 600 MG tablet Take 1 tablet (600 mg total) by mouth 3 (three) times daily. (Patient taking differently: Take 1,200 mg by mouth 3 (three) times daily. Dr. Toy Care) 90 tablet 0  . levothyroxine (SYNTHROID, LEVOTHROID) 25 MCG tablet TAKE 1 TABLET ONE TIME DAILY BEFORE BREAKFAST 90 tablet 2  . metFORMIN (GLUCOPHAGE) 500 MG tablet Take 1 tablet (500 mg total) by mouth 2 (two) times daily with a meal. 60 tablet 0  . omega-3 acid ethyl esters (LOVAZA) 1 G capsule Take 1 g by mouth 2 (two) times daily.    Marland Kitchen omeprazole (PRILOSEC) 20 MG capsule Take 1 capsule (20 mg total) by mouth daily. 90 capsule 3  . QUEtiapine (SEROQUEL) 400 MG tablet Take 800 mg by mouth at bedtime.    . traMADol (ULTRAM) 50 MG tablet Take 2 tablets (100 mg total) by mouth every 6 (six) hours as needed for pain. (Patient taking differently: Take 100 mg by mouth every 6 (six) hours as needed for moderate pain. Dr. Toy Care) 30 tablet 0  . venlafaxine XR (EFFEXOR-XR) 75 MG 24 hr capsule Take 75 mg by mouth 3  (three) times daily.      Current Facility-Administered Medications on File Prior to Visit  Medication Dose Route Frequency Provider Last Rate Last Dose  . 0.9 %  sodium chloride infusion  500 mL Intravenous Once Irene Shipper, MD        PAST MEDICAL HISTORY: Past Medical History:  Diagnosis Date  . ADHD   . Anxiety   . Barrett esophagus   . Breast nodule    benign  . Cervical arthritis 09/22/2013  . Depression   . Elevated liver enzymes   . Fatty liver   . Fibromyalgia   . Fibromyalgia   . GERD (gastroesophageal reflux disease)   . H/O hiatal hernia   . Headache(784.0)    migraines  . High cholesterol   . Lymphocytic colitis   . Migraines   . Pain   . Panic attack   . Paraesophageal hiatal hernia    repaired 2009  . Spinal stenosis   . Thyroid disease   . Thyroid disease     PAST SURGICAL HISTORY: Past Surgical History:  Procedure Laterality Date  . ANKLE SURGERY    .  CHOLECYSTECTOMY  1980s?  . EUS N/A 11/19/2013   Procedure: ESOPHAGEAL ENDOSCOPIC ULTRASOUND (EUS) RADIAL;  Surgeon: Arta Silence, MD;  Location: [facility];  Service: Endoscopy;  Laterality: N/A;  . LAPAROSCOPIC NISSEN FUNDOPLICATION  33/00/7622  . LAPAROSCOPIC PARAESOPHAGEAL HERNIA REPAIR  03/05/2008   Dr Johney Maine  . TUBAL LIGATION  1990s?  Marland Kitchen VAGINAL HYSTERECTOMY  03/04/2001    SOCIAL HISTORY: Social History   Tobacco Use  . Smoking status: Never Smoker  . Smokeless tobacco: Never Used  Substance Use Topics  . Alcohol use: Yes    Alcohol/week: 0.0 standard drinks    Comment: wine occassionally  . Drug use: No    FAMILY HISTORY: Family History  Problem Relation Age of Onset  . Alcohol abuse Other   . Colon cancer Other   . Elevated Lipids Other   . Heart disease Other   . Anxiety disorder Mother   . Obesity Mother   . High Cholesterol Mother   . Thyroid cancer Mother   . Depression Mother   . High Cholesterol Father   . Heart disease Father   . Alcoholism Father   . Colon  cancer Maternal Aunt   . Stomach cancer Neg Hx   . Esophageal cancer Neg Hx     ROS: Review of Systems  Constitutional: Positive for weight loss.  Gastrointestinal: Negative for nausea and vomiting.  Musculoskeletal: Negative for myalgias.       Negative for muscle weakness    PHYSICAL EXAM: Blood pressure 107/68, pulse 67, temperature 97.7 F (36.5 C), temperature source Oral, height 5\' 4"  (1.626 m), weight 183 lb (83 kg), SpO2 96 %. Body mass index is 31.41 kg/m. Physical Exam Vitals signs reviewed.  Constitutional:      Appearance: Normal appearance. She is well-developed. She is obese.  Cardiovascular:     Rate and Rhythm: Normal rate.  Pulmonary:     Effort: Pulmonary effort is normal.  Musculoskeletal: Normal range of motion.  Skin:    General: Skin is warm and dry.  Neurological:     Mental Status: She is alert and oriented to person, place, and time.  Psychiatric:        Mood and Affect: Mood normal.        Behavior: Behavior normal.     RECENT LABS AND TESTS: BMET    Component Value Date/Time   NA 141 05/29/2018 1310   K 4.2 05/29/2018 1310   CL 100 05/29/2018 1310   CO2 23 05/29/2018 1310   GLUCOSE 84 05/29/2018 1310   GLUCOSE 96 04/05/2017 1140   BUN 15 05/29/2018 1310   CREATININE 0.93 05/29/2018 1310   CALCIUM 9.5 05/29/2018 1310   GFRNONAA 67 05/29/2018 1310   GFRAA 78 05/29/2018 1310   Lab Results  Component Value Date   HGBA1C 5.3 05/29/2018   HGBA1C 5.5 01/23/2018   HGBA1C 5.5 04/05/2017   HGBA1C 5.5 08/27/2015   HGBA1C  09/30/2009    5.4 (NOTE)                                                                       According to the ADA Clinical Practice Recommendations for 2011, when HbA1c is used as a screening test:   >=6.5%  Diagnostic of Diabetes Mellitus           (if abnormal result  is confirmed)  5.7-6.4%   Increased risk of developing Diabetes Mellitus  References:Diagnosis and Classification of Diabetes Mellitus,Diabetes  IZTI,4580,99(IPJAS 1):S62-S69 and Standards of Medical Care in         Diabetes - 2011,Diabetes Care,2011,34  (Suppl 1):S11-S61.   Lab Results  Component Value Date   INSULIN 6.2 05/29/2018   INSULIN 10.4 01/23/2018   CBC    Component Value Date/Time   WBC 4.9 10/30/2017 1514   RBC 4.50 10/30/2017 1514   HGB 13.9 10/30/2017 1514   HCT 40.9 10/30/2017 1514   PLT 243.0 10/30/2017 1514   MCV 90.8 10/30/2017 1514   MCH 31.3 09/22/2015 2321   MCHC 34.0 10/30/2017 1514   RDW 14.0 10/30/2017 1514   LYMPHSABS 2.4 10/30/2017 1514   MONOABS 0.5 10/30/2017 1514   EOSABS 0.1 10/30/2017 1514   BASOSABS 0.0 10/30/2017 1514   Iron/TIBC/Ferritin/ %Sat    Component Value Date/Time   IRON 68 10/30/2017 1514   TIBC 319 10/30/2017 1514   FERRITIN 23 10/30/2017 1514   IRONPCTSAT 21 10/30/2017 1514   Lipid Panel     Component Value Date/Time   CHOL 195 02/15/2018 1042   TRIG 85.0 02/15/2018 1042   HDL 71.90 02/15/2018 1042   CHOLHDL 3 02/15/2018 1042   VLDL 17.0 02/15/2018 1042   LDLCALC 106 (H) 02/15/2018 1042   LDLDIRECT 168.0 04/05/2017 1140   Hepatic Function Panel     Component Value Date/Time   PROT 6.8 05/29/2018 1310   ALBUMIN 4.7 05/29/2018 1310   AST 21 05/29/2018 1310   ALT 21 05/29/2018 1310   ALKPHOS 115 05/29/2018 1310   BILITOT 0.3 05/29/2018 1310   BILIDIR 0.1 02/15/2018 1736   IBILI NOT CALCULATED 09/21/2013 2215      Component Value Date/Time   TSH 1.530 01/23/2018 1142   TSH 2.05 04/05/2017 1140   TSH 3.72 08/05/2015 0903     Ref. Range 10/30/2017 15:14  VITD Latest Ref Range: 30.00 - 100.00 ng/mL 41.24     OBESITY BEHAVIORAL INTERVENTION VISIT  Today's visit was # 9   Starting weight: 196 lbs Starting date: 01/23/2018 Today's weight : 183 lbs Today's date: 05/29/2018 Total lbs lost to date: 13 At least 15 minutes were spent on discussing the following behavioral intervention visit.   ASK: We discussed the diagnosis of obesity with [name] today and Milessa agreed to give [name] permission to discuss obesity behavioral modification therapy today.  ASSESS: Happy has the diagnosis of obesity and her BMI today is 31.4 [place] is in the action stage of change   ADVISE: Osiris was educated on the multiple health risks of obesity as well as the benefit of weight loss to improve her health. She was advised of the need for long term treatment and the importance of lifestyle modifications to improve her current health and to decrease her risk of future health problems.  AGREE: Multiple dietary modification options and treatment options were discussed and  Destine agreed to follow the recommendations documented in the above note.  ARRANGE: Omelia was educated on the importance of frequent visits to treat obesity as outlined per CMS and USPSTF guidelines and agreed to schedule her next follow up appointment today.  Corey Skains, am acting as Location manager for [employer]. Owens Shark, DO  I have reviewed the above documentation for accuracy and completeness, and I agree with the above. -  Jearld Lesch, DO

## 2018-06-12 ENCOUNTER — Ambulatory Visit (INDEPENDENT_AMBULATORY_CARE_PROVIDER_SITE_OTHER): Payer: Medicare HMO | Admitting: Family Medicine

## 2018-06-15 ENCOUNTER — Other Ambulatory Visit (INDEPENDENT_AMBULATORY_CARE_PROVIDER_SITE_OTHER): Payer: Self-pay | Admitting: Bariatrics

## 2018-06-15 DIAGNOSIS — E8881 Metabolic syndrome: Secondary | ICD-10-CM

## 2018-06-17 ENCOUNTER — Encounter (INDEPENDENT_AMBULATORY_CARE_PROVIDER_SITE_OTHER): Payer: Self-pay | Admitting: Bariatrics

## 2018-06-17 ENCOUNTER — Ambulatory Visit (INDEPENDENT_AMBULATORY_CARE_PROVIDER_SITE_OTHER): Payer: Medicare HMO | Admitting: Bariatrics

## 2018-06-17 VITALS — BP 91/62 | HR 93 | Temp 97.6°F | Ht 64.0 in | Wt 183.0 lb

## 2018-06-17 DIAGNOSIS — E7849 Other hyperlipidemia: Secondary | ICD-10-CM

## 2018-06-17 DIAGNOSIS — E8881 Metabolic syndrome: Secondary | ICD-10-CM

## 2018-06-17 DIAGNOSIS — Z6831 Body mass index (BMI) 31.0-31.9, adult: Secondary | ICD-10-CM

## 2018-06-17 DIAGNOSIS — E669 Obesity, unspecified: Secondary | ICD-10-CM

## 2018-06-17 MED ORDER — METFORMIN HCL 500 MG PO TABS: 500.0000 mg | ORAL_TABLET | Freq: Two times a day (BID) | ORAL | 0 refills | Status: DC

## 2018-06-18 ENCOUNTER — Other Ambulatory Visit: Payer: Self-pay

## 2018-06-18 DIAGNOSIS — K219 Gastro-esophageal reflux disease without esophagitis: Secondary | ICD-10-CM

## 2018-06-18 DIAGNOSIS — R1310 Dysphagia, unspecified: Secondary | ICD-10-CM

## 2018-06-18 DIAGNOSIS — K222 Esophageal obstruction: Secondary | ICD-10-CM

## 2018-06-18 MED ORDER — OMEPRAZOLE 20 MG PO CPDR: 20.0000 mg | DELAYED_RELEASE_CAPSULE | Freq: Every day | ORAL | 3 refills | Status: DC

## 2018-06-18 NOTE — Progress Notes (Signed)
Office: [phone]  /  Fax: [phone]   HPI:   Chief Complaint: OBESITY Leslie Benson is here to discuss her progress with her obesity treatment plan. She is on the Category 2 plan and is following her eating plan approximately 80 % of the time. She states she is riding the stationary bike and walking for 45 minutes 5 times per week. Leslie Benson is doing fairly well overall. She has hit a plateau (states that she is doing everything). Leslie Benson has had more hunger over the last few days, but she is out of Metformin. Her weight is 183 lb (83 kg) today and she has maintained weight over a period of 3 weeks since her last visit. She has lost 13 lbs since starting treatment with [name].  Insulin Resistance Leslie Benson has a diagnosis of insulin resistance based on her elevated fasting insulin level >5. Although Leslie Benson's blood glucose readings are still under good control, insulin resistance puts her at greater risk of metabolic syndrome and diabetes. She has had increased hunger without medication. Leslie Benson continues to work on diet and exercise to decrease risk of diabetes. Leslie Benson denies polyuria and polydipsia.  Hyperlipidemia Leslie Benson has hyperlipidemia and she is currently taking Lovaza. She has been trying to improve her cholesterol levels with intensive lifestyle modification including a low saturated fat diet, exercise and weight loss. She denies myalgias.  ASSESSMENT AND PLAN:  Insulin resistance - Plan: metFORMIN (GLUCOPHAGE) 500 MG tablet  Other hyperlipidemia  Class 1 obesity with serious comorbidity and body mass index (BMI) of 31.0 to 31.9 in adult, unspecified obesity type  PLAN:  Insulin Resistance Leslie Benson will continue to work on weight loss, exercise, and decreasing simple carbohydrates in her diet to help decrease the risk of diabetes. We dicussed metformin including benefits and risks. She was informed that eating too many simple carbohydrates or too many calories at one sitting  increases the likelihood of GI side effects. Leslie Benson agrees to take metformin 500 mg BID with meals #60 with no refills and follow up with [name] as directed to monitor her progress.  Hyperlipidemia Leslie Benson was informed of the American Heart Association Guidelines emphasizing intensive lifestyle modifications as the first line treatment for hyperlipidemia. We discussed many lifestyle modifications today in depth, and Leslie Benson will continue to work on decreasing saturated fats such as fatty red meat, butter and many fried foods. She will also increase vegetables and lean protein in her diet and continue to work on exercise and weight loss efforts. Leslie Benson will continue Lovaza and follow up at the agreed upon time.  Obesity Leslie Benson is currently in the action stage of change. As such, her goal is to continue with weight loss efforts She has agreed to follow the Category 2 plan Leslie Benson has been instructed to work up to a goal of 150 minutes of combined cardio and strengthening exercise per week for weight loss and overall health benefits. We discussed the following Behavioral Modification Strategies today: increase H2O intake, keeping healthy foods in the home, increasing lean protein intake, decreasing simple carbohydrates, increasing vegetables, work on meal planning and easy cooking plans and decrease liquid calories  Leslie Benson has agreed to follow up with our clinic in 2 weeks. She was informed of the importance of frequent follow up visits to maximize her success with intensive lifestyle modifications for her multiple health conditions.  ALLERGIES: Allergies  Allergen Reactions  . Codeine Nausea And Vomiting  . Compazine [Prochlorperazine Edisylate]     "feels weird"  . Sulfa Antibiotics  Unknown reaction.    MEDICATIONS: Current Outpatient Medications on File Prior to Visit  Medication Sig Dispense Refill  . ALPRAZolam (XANAX) 1 MG tablet Take 2 mg by mouth 3 (three) times daily as needed  for anxiety (anxiety). .    . atorvastatin (LIPITOR) 20 MG tablet TAKE 1 TABLET EVERY DAY 90 tablet 3  . Cholecalciferol (PA VITAMIN D-3 GUMMY PO) Take 1 Dose by mouth daily.    . Cholecalciferol (VITAMIN D) 2000 units tablet Take 1 tablet (2,000 Units total) by mouth daily. 30 tablet   . gabapentin (NEURONTIN) 600 MG tablet Take 1 tablet (600 mg total) by mouth 3 (three) times daily. (Patient taking differently: Take 1,200 mg by mouth 3 (three) times daily. Dr. Toy Care) 90 tablet 0  . levothyroxine (SYNTHROID, LEVOTHROID) 25 MCG tablet TAKE 1 TABLET ONE TIME DAILY BEFORE BREAKFAST 90 tablet 2  . omega-3 acid ethyl esters (LOVAZA) 1 G capsule Take 1 g by mouth 2 (two) times daily.    Marland Kitchen omeprazole (PRILOSEC) 20 MG capsule Take 1 capsule (20 mg total) by mouth daily. 90 capsule 3  . QUEtiapine (SEROQUEL) 400 MG tablet Take 800 mg by mouth at bedtime.    . traMADol (ULTRAM) 50 MG tablet Take 2 tablets (100 mg total) by mouth every 6 (six) hours as needed for pain. (Patient taking differently: Take 100 mg by mouth every 6 (six) hours as needed for moderate pain. Dr. Toy Care) 30 tablet 0  . venlafaxine XR (EFFEXOR-XR) 75 MG 24 hr capsule Take 75 mg by mouth 3 (three) times daily.      Current Facility-Administered Medications on File Prior to Visit  Medication Dose Route Frequency Provider Last Rate Last Dose  . 0.9 %  sodium chloride infusion  500 mL Intravenous Once Irene Shipper, MD        PAST MEDICAL HISTORY: Past Medical History:  Diagnosis Date  . ADHD   . Anxiety   . Barrett esophagus   . Breast nodule    benign  . Cervical arthritis 09/22/2013  . Depression   . Elevated liver enzymes   . Fatty liver   . Fibromyalgia   . Fibromyalgia   . GERD (gastroesophageal reflux disease)   . H/O hiatal hernia   . Headache(784.0)    migraines  . High cholesterol   . Lymphocytic colitis   . Migraines   . Pain   . Panic attack   . Paraesophageal hiatal hernia    repaired 2009  . Spinal stenosis    . Thyroid disease   . Thyroid disease     PAST SURGICAL HISTORY: Past Surgical History:  Procedure Laterality Date  . ANKLE SURGERY    . CHOLECYSTECTOMY  1980s?  . EUS N/A 11/19/2013   Procedure: ESOPHAGEAL ENDOSCOPIC ULTRASOUND (EUS) RADIAL;  Surgeon: Arta Silence, MD;  Location: [facility];  Service: Endoscopy;  Laterality: N/A;  . LAPAROSCOPIC NISSEN FUNDOPLICATION  16/01/9603  . LAPAROSCOPIC PARAESOPHAGEAL HERNIA REPAIR  03/05/2008   Dr Johney Maine  . TUBAL LIGATION  1990s?  Marland Kitchen VAGINAL HYSTERECTOMY  03/04/2001    SOCIAL HISTORY: Social History   Tobacco Use  . Smoking status: Never Smoker  . Smokeless tobacco: Never Used  Substance Use Topics  . Alcohol use: Yes    Alcohol/week: 0.0 standard drinks    Comment: wine occassionally  . Drug use: No    FAMILY HISTORY: Family History  Problem Relation Age of Onset  . Alcohol abuse Other   . Colon cancer  Other   . Elevated Lipids Other   . Heart disease Other   . Anxiety disorder Mother   . Obesity Mother   . High Cholesterol Mother   . Thyroid cancer Mother   . Depression Mother   . High Cholesterol Father   . Heart disease Father   . Alcoholism Father   . Colon cancer Maternal Aunt   . Stomach cancer Neg Hx   . Esophageal cancer Neg Hx     ROS: Review of Systems  Constitutional: Negative for weight loss.  Genitourinary: Negative for frequency.  Musculoskeletal: Negative for myalgias.  Endo/Heme/Allergies: Negative for polydipsia.    PHYSICAL EXAM: Blood pressure 91/62, pulse 93, temperature 97.6 F (36.4 C), temperature source Oral, height 5\' 4"  (1.626 m), weight 183 lb (83 kg), SpO2 96 %. Body mass index is 31.41 kg/m. Physical Exam Vitals signs reviewed.  Constitutional:      Appearance: Normal appearance. She is well-developed. She is obese.  Cardiovascular:     Rate and Rhythm: Normal rate.  Pulmonary:     Effort: Pulmonary effort is normal.  Musculoskeletal: Normal range of motion.  Skin:     General: Skin is warm and dry.  Neurological:     Mental Status: She is alert and oriented to person, place, and time.  Psychiatric:        Mood and Affect: Mood normal.        Behavior: Behavior normal.     RECENT LABS AND TESTS: BMET    Component Value Date/Time   NA 141 05/29/2018 1310   K 4.2 05/29/2018 1310   CL 100 05/29/2018 1310   CO2 23 05/29/2018 1310   GLUCOSE 84 05/29/2018 1310   GLUCOSE 96 04/05/2017 1140   BUN 15 05/29/2018 1310   CREATININE 0.93 05/29/2018 1310   CALCIUM 9.5 05/29/2018 1310   GFRNONAA 67 05/29/2018 1310   GFRAA 78 05/29/2018 1310   Lab Results  Component Value Date   HGBA1C 5.3 05/29/2018   HGBA1C 5.5 01/23/2018   HGBA1C 5.5 04/05/2017   HGBA1C 5.5 08/27/2015   HGBA1C  09/30/2009    5.4 (NOTE)                                                                       According to the ADA Clinical Practice Recommendations for 2011, when HbA1c is used as a screening test:   >=6.5%   Diagnostic of Diabetes Mellitus           (if abnormal result  is confirmed)  5.7-6.4%   Increased risk of developing Diabetes Mellitus  References:Diagnosis and Classification of Diabetes Mellitus,Diabetes TSVX,7939,03(ESPQZ 1):S62-S69 and Standards of Medical Care in         Diabetes - 2011,Diabetes RAQT,6226,33  (Suppl 1):S11-S61.   Lab Results  Component Value Date   INSULIN 6.2 05/29/2018   INSULIN 10.4 01/23/2018   CBC    Component Value Date/Time   WBC 4.9 10/30/2017 1514   RBC 4.50 10/30/2017 1514   HGB 13.9 10/30/2017 1514   HCT 40.9 10/30/2017 1514   PLT 243.0 10/30/2017 1514   MCV 90.8 10/30/2017 1514   MCH 31.3 09/22/2015 2321   MCHC 34.0 10/30/2017 1514   RDW 14.0 10/30/2017 1514  LYMPHSABS 2.4 10/30/2017 1514   MONOABS 0.5 10/30/2017 1514   EOSABS 0.1 10/30/2017 1514   BASOSABS 0.0 10/30/2017 1514   Iron/TIBC/Ferritin/ %Sat    Component Value Date/Time   IRON 68 10/30/2017 1514   TIBC 319 10/30/2017 1514   FERRITIN 23 10/30/2017 1514     IRONPCTSAT 21 10/30/2017 1514   Lipid Panel     Component Value Date/Time   CHOL 195 02/15/2018 1042   TRIG 85.0 02/15/2018 1042   HDL 71.90 02/15/2018 1042   CHOLHDL 3 02/15/2018 1042   VLDL 17.0 02/15/2018 1042   LDLCALC 106 (H) 02/15/2018 1042   LDLDIRECT 168.0 04/05/2017 1140   Hepatic Function Panel     Component Value Date/Time   PROT 6.8 05/29/2018 1310   ALBUMIN 4.7 05/29/2018 1310   AST 21 05/29/2018 1310   ALT 21 05/29/2018 1310   ALKPHOS 115 05/29/2018 1310   BILITOT 0.3 05/29/2018 1310   BILIDIR 0.1 02/15/2018 1736   IBILI NOT CALCULATED 09/21/2013 2215      Component Value Date/Time   TSH 1.530 01/23/2018 1142   TSH 2.05 04/05/2017 1140   TSH 3.72 08/05/2015 0903     Ref. Range 05/29/2018 13:10  Vitamin D, 25-Hydroxy Latest Ref Range: 30.0 - 100.0 ng/mL 45.4     OBESITY BEHAVIORAL INTERVENTION VISIT  Today's visit was # 10   Starting weight: 196 lbs Starting date: 01/23/2018 Today's weight : 183 lbs  Today's date: 06/17/2018 Total lbs lost to date: 13 At least 15 minutes were spent on discussing the following behavioral intervention visit.    06/17/2018  Height 5\' 4"  (1.626 m)  Weight 183 lb (83 kg)  BMI (Calculated) 31.4  BLOOD PRESSURE - SYSTOLIC 91  BLOOD PRESSURE - DIASTOLIC 62   Body Fat % 07.1 %  Total Body Water (lbs) 66.6 lbs    ASK: We discussed the diagnosis of obesity with [name] today and Leslie Benson agreed to give [name] permission to discuss obesity behavioral modification therapy today.  ASSESS: Leslie Benson has the diagnosis of obesity and her BMI today is 31.4 Leslie Benson is in the action stage of change   ADVISE: Leslie Benson was educated on the multiple health risks of obesity as well as the benefit of weight loss to improve her health. She was advised of the need for long term treatment and the importance of lifestyle modifications to improve her current health and to decrease her risk of future health  problems.  AGREE: Multiple dietary modification options and treatment options were discussed and  Leslie Benson agreed to follow the recommendations documented in the above note.  ARRANGE: Leslie Benson was educated on the importance of frequent visits to treat obesity as outlined per CMS and USPSTF guidelines and agreed to schedule her next follow up appointment today.  Corey Skains, am acting as Location manager for [employer]. Owens Shark, DO  I have reviewed the above documentation for accuracy and completeness, and I agree with the above. -Jearld Lesch, DO

## 2018-06-28 ENCOUNTER — Telehealth: Payer: Self-pay | Admitting: Internal Medicine

## 2018-07-01 NOTE — Telephone Encounter (Signed)
Lm on vm that patient can take OTC Prilosec until her rx can be refilled.  She takes 20mg  daily so she can take one OTC Prilosec daily as well.

## 2018-07-03 ENCOUNTER — Other Ambulatory Visit: Payer: Self-pay

## 2018-07-03 ENCOUNTER — Encounter (INDEPENDENT_AMBULATORY_CARE_PROVIDER_SITE_OTHER): Payer: Self-pay | Admitting: Bariatrics

## 2018-07-03 ENCOUNTER — Ambulatory Visit (INDEPENDENT_AMBULATORY_CARE_PROVIDER_SITE_OTHER): Payer: Medicare HMO | Admitting: Bariatrics

## 2018-07-03 VITALS — BP 97/65 | HR 100 | Temp 97.9°F | Ht 64.0 in | Wt 178.0 lb

## 2018-07-03 DIAGNOSIS — E669 Obesity, unspecified: Secondary | ICD-10-CM | POA: Diagnosis not present

## 2018-07-03 DIAGNOSIS — Z6830 Body mass index (BMI) 30.0-30.9, adult: Secondary | ICD-10-CM

## 2018-07-03 DIAGNOSIS — E785 Hyperlipidemia, unspecified: Secondary | ICD-10-CM | POA: Diagnosis not present

## 2018-07-03 DIAGNOSIS — E8881 Metabolic syndrome: Secondary | ICD-10-CM

## 2018-07-03 DIAGNOSIS — E1169 Type 2 diabetes mellitus with other specified complication: Secondary | ICD-10-CM

## 2018-07-03 NOTE — Progress Notes (Signed)
Office: [phone]  /  Fax: [phone]   HPI:   Chief Complaint: OBESITY Leslie Benson is here to discuss her progress with her obesity treatment plan. She is on the Category 2 plan and is following her eating plan approximately 80% of the time. She states she is exercising 0 minutes 0 times per week. Leslie Benson is doing well overall and is back on track. She is doing well with her protein and water intake. Her weight is 178 lb (80.7 kg) today and has had a weight loss of 5 pounds over a period of 2 weeks since her last visit. She has lost 18 lbs since starting treatment with [name].  Insulin Resistance Leslie Benson has a diagnosis of insulin resistance based on her elevated fasting insulin level >5. Although Leslie Benson's blood glucose readings are still under good control, insulin resistance puts her at greater risk of metabolic syndrome and diabetes. She is taking metformin currently and continues to work on diet and exercise to decrease risk of diabetes. She denies polydipsia and polyphagia.  Hyperlipidemia Smita has hyperlipidemia and is taking atorvastatin. Leslie Benson has been trying to improve her cholesterol levels with intensive lifestyle modification including a low saturated fat diet, exercise and weight loss. She denies myalgias.  ASSESSMENT AND PLAN:  Insulin resistance  Hyperlipidemia associated with type 2 diabetes mellitus (HCC)  Class 1 obesity with serious comorbidity and body mass index (BMI) of 30.0 to 30.9 in adult, unspecified obesity type  PLAN:  Insulin Resistance Leslie Benson will continue to work on weight loss, exercise, and decreasing simple carbohydrates in her diet to help decrease the risk of diabetes. We dicussed metformin including benefits and risks. She was informed that eating too many simple carbohydrates or too many calories at one sitting increases the likelihood of GI side effects. Leslie Benson will continue her metformin and agrees to follow-up with [name] as directed to monitor  her progress.  Hyperlipidemia Leslie Benson was informed of the American Heart Association Guidelines emphasizing intensive lifestyle modifications as the first line treatment for hyperlipidemia. We discussed many lifestyle modifications today in depth, and Leslie Benson will continue to work on decreasing saturated fats such as fatty red meat, butter and many fried foods. She will continue her medication, increase vegetables and lean protein in her diet, and continue to work on exercise and weight loss efforts.  Obesity Leslie Benson is currently in the action stage of change. As such, her goal is to continue with weight loss efforts. She has agreed to follow the Category 2 plan. She was advised to do meal planning, increase her protein intake, and keep her water to 64+ ounces. Leslie Benson has been instructed to resume using her stationary bike and walking her dog. We discussed the following Behavioral Modification Strategies today: increasing lean protein intake, decreasing simple carbohydrates, increasing vegetables, increase H20 intake, decrease eating out, no skipping meals, work on meal planning and easy cooking plans, and keeping healthy foods in the home.  Leslie Benson has agreed to follow-up with our clinic in 2 weeks. She was informed of the importance of frequent follow-up visits to maximize her success with intensive lifestyle modifications for her multiple health conditions.  ALLERGIES: Allergies  Allergen Reactions  . Codeine Nausea And Vomiting  . Compazine [Prochlorperazine Edisylate]     "feels weird"  . Sulfa Antibiotics     Unknown reaction.    MEDICATIONS: Current Outpatient Medications on File Prior to Visit  Medication Sig Dispense Refill  . ALPRAZolam (XANAX) 1 MG tablet Take 2 mg by mouth 3 (  three) times daily as needed for anxiety (anxiety). .    . atorvastatin (LIPITOR) 20 MG tablet TAKE 1 TABLET EVERY DAY 90 tablet 3  . Cholecalciferol (PA VITAMIN D-3 GUMMY PO) Take 1 Dose by mouth  daily.    . Cholecalciferol (VITAMIN D) 2000 units tablet Take 1 tablet (2,000 Units total) by mouth daily. 30 tablet   . gabapentin (NEURONTIN) 600 MG tablet Take 1 tablet (600 mg total) by mouth 3 (three) times daily. (Patient taking differently: Take 1,200 mg by mouth 3 (three) times daily. Dr. Toy Care) 90 tablet 0  . levothyroxine (SYNTHROID, LEVOTHROID) 25 MCG tablet TAKE 1 TABLET ONE TIME DAILY BEFORE BREAKFAST 90 tablet 2  . metFORMIN (GLUCOPHAGE) 500 MG tablet Take 1 tablet (500 mg total) by mouth 2 (two) times daily with a meal. 60 tablet 0  . omega-3 acid ethyl esters (LOVAZA) 1 G capsule Take 1 g by mouth 2 (two) times daily.    Marland Kitchen omeprazole (PRILOSEC) 20 MG capsule Take 1 capsule (20 mg total) by mouth daily. 90 capsule 3  . QUEtiapine (SEROQUEL) 400 MG tablet Take 800 mg by mouth at bedtime.    . traMADol (ULTRAM) 50 MG tablet Take 2 tablets (100 mg total) by mouth every 6 (six) hours as needed for pain. (Patient taking differently: Take 100 mg by mouth every 6 (six) hours as needed for moderate pain. Dr. Toy Care) 30 tablet 0  . venlafaxine XR (EFFEXOR-XR) 75 MG 24 hr capsule Take 75 mg by mouth 3 (three) times daily.      Current Facility-Administered Medications on File Prior to Visit  Medication Dose Route Frequency Provider Last Rate Last Dose  . 0.9 %  sodium chloride infusion  500 mL Intravenous Once Irene Shipper, MD        PAST MEDICAL HISTORY: Past Medical History:  Diagnosis Date  . ADHD   . Anxiety   . Barrett esophagus   . Breast nodule    benign  . Cervical arthritis 09/22/2013  . Depression   . Elevated liver enzymes   . Fatty liver   . Fibromyalgia   . Fibromyalgia   . GERD (gastroesophageal reflux disease)   . H/O hiatal hernia   . Headache(784.0)    migraines  . High cholesterol   . Lymphocytic colitis   . Migraines   . Pain   . Panic attack   . Paraesophageal hiatal hernia    repaired 2009  . Spinal stenosis   . Thyroid disease   . Thyroid disease      PAST SURGICAL HISTORY: Past Surgical History:  Procedure Laterality Date  . ANKLE SURGERY    . CHOLECYSTECTOMY  1980s?  . EUS N/A 11/19/2013   Procedure: ESOPHAGEAL ENDOSCOPIC ULTRASOUND (EUS) RADIAL;  Surgeon: Arta Silence, MD;  Location: [facility];  Service: Endoscopy;  Laterality: N/A;  . LAPAROSCOPIC NISSEN FUNDOPLICATION  09/47/0962  . LAPAROSCOPIC PARAESOPHAGEAL HERNIA REPAIR  03/05/2008   Dr Johney Maine  . TUBAL LIGATION  1990s?  Marland Kitchen VAGINAL HYSTERECTOMY  03/04/2001    SOCIAL HISTORY: Social History   Tobacco Use  . Smoking status: Never Smoker  . Smokeless tobacco: Never Used  Substance Use Topics  . Alcohol use: Yes    Alcohol/week: 0.0 standard drinks    Comment: wine occassionally  . Drug use: No    FAMILY HISTORY: Family History  Problem Relation Age of Onset  . Alcohol abuse Other   . Colon cancer Other   . Elevated Lipids Other   .  Heart disease Other   . Anxiety disorder Mother   . Obesity Mother   . High Cholesterol Mother   . Thyroid cancer Mother   . Depression Mother   . High Cholesterol Father   . Heart disease Father   . Alcoholism Father   . Colon cancer Maternal Aunt   . Stomach cancer Neg Hx   . Esophageal cancer Neg Hx    ROS: Review of Systems  Constitutional: Positive for weight loss.  Endo/Heme/Allergies: Negative for polydipsia.       Negative for polyphagia. Negative for hypoglycemia.   PHYSICAL EXAM: Blood pressure 97/65, pulse 100, temperature 97.9 F (36.6 C), temperature source Oral, height 5\' 4"  (1.626 m), weight 178 lb (80.7 kg), SpO2 97 %. Body mass index is 30.55 kg/m. Physical Exam Vitals signs reviewed.  Constitutional:      Appearance: Normal appearance. She is obese.  Cardiovascular:     Rate and Rhythm: Normal rate.     Pulses: Normal pulses.  Pulmonary:     Effort: Pulmonary effort is normal.     Breath sounds: Normal breath sounds.  Musculoskeletal: Normal range of motion.  Skin:    General: Skin is warm  and dry.  Neurological:     Mental Status: She is alert and oriented to person, place, and time.  Psychiatric:        Behavior: Behavior normal.   RECENT LABS AND TESTS: BMET    Component Value Date/Time   NA 141 05/29/2018 1310   K 4.2 05/29/2018 1310   CL 100 05/29/2018 1310   CO2 23 05/29/2018 1310   GLUCOSE 84 05/29/2018 1310   GLUCOSE 96 04/05/2017 1140   BUN 15 05/29/2018 1310   CREATININE 0.93 05/29/2018 1310   CALCIUM 9.5 05/29/2018 1310   GFRNONAA 67 05/29/2018 1310   GFRAA 78 05/29/2018 1310   Lab Results  Component Value Date   HGBA1C 5.3 05/29/2018   HGBA1C 5.5 01/23/2018   HGBA1C 5.5 04/05/2017   HGBA1C 5.5 08/27/2015   HGBA1C  09/30/2009    5.4 (NOTE)                                                                       According to the ADA Clinical Practice Recommendations for 2011, when HbA1c is used as a screening test:   >=6.5%   Diagnostic of Diabetes Mellitus           (if abnormal result  is confirmed)  5.7-6.4%   Increased risk of developing Diabetes Mellitus  References:Diagnosis and Classification of Diabetes Mellitus,Diabetes TKZS,0109,32(TFTDD 1):S62-S69 and Standards of Medical Care in         Diabetes - 2011,Diabetes Care,2011,34  (Suppl 1):S11-S61.   Lab Results  Component Value Date   INSULIN 6.2 05/29/2018   INSULIN 10.4 01/23/2018   CBC    Component Value Date/Time   WBC 4.9 10/30/2017 1514   RBC 4.50 10/30/2017 1514   HGB 13.9 10/30/2017 1514   HCT 40.9 10/30/2017 1514   PLT 243.0 10/30/2017 1514   MCV 90.8 10/30/2017 1514   MCH 31.3 09/22/2015 2321   MCHC 34.0 10/30/2017 1514   RDW 14.0 10/30/2017 1514   LYMPHSABS 2.4 10/30/2017 1514   MONOABS 0.5 10/30/2017  1514   EOSABS 0.1 10/30/2017 1514   BASOSABS 0.0 10/30/2017 1514   Iron/TIBC/Ferritin/ %Sat    Component Value Date/Time   IRON 68 10/30/2017 1514   TIBC 319 10/30/2017 1514   FERRITIN 23 10/30/2017 1514   IRONPCTSAT 21 10/30/2017 1514   Lipid Panel     Component  Value Date/Time   CHOL 195 02/15/2018 1042   TRIG 85.0 02/15/2018 1042   HDL 71.90 02/15/2018 1042   CHOLHDL 3 02/15/2018 1042   VLDL 17.0 02/15/2018 1042   LDLCALC 106 (H) 02/15/2018 1042   LDLDIRECT 168.0 04/05/2017 1140   Hepatic Function Panel     Component Value Date/Time   PROT 6.8 05/29/2018 1310   ALBUMIN 4.7 05/29/2018 1310   AST 21 05/29/2018 1310   ALT 21 05/29/2018 1310   ALKPHOS 115 05/29/2018 1310   BILITOT 0.3 05/29/2018 1310   BILIDIR 0.1 02/15/2018 1736   IBILI NOT CALCULATED 09/21/2013 2215      Component Value Date/Time   TSH 1.530 01/23/2018 1142   TSH 2.05 04/05/2017 1140   TSH 3.72 08/05/2015 0903   Results for TAMAR, MIANO (MRN [id number]) as of 07/03/2018 15:22  Ref. Range 05/29/2018 13:10  Vitamin D, 25-Hydroxy Latest Ref Range: 30.0 - 100.0 ng/mL 45.4   OBESITY BEHAVIORAL INTERVENTION VISIT  Today's visit was #11  Starting weight: 196 lbs Starting date: 01/23/2018 Today's weight: 178 lbs Today's date: 07/03/2018 Total lbs lost to date: 18 At least 15 minutes were spent on discussing the following behavioral intervention visit.    07/03/2018  Height 5\' 4"  (1.626 m)  Weight 178 lb (80.7 kg)  BMI (Calculated) 30.54  BLOOD PRESSURE - SYSTOLIC 97  BLOOD PRESSURE - DIASTOLIC 65   Body Fat % 76.2 %  Total Body Water (lbs) 64.6 lbs   ASK: We discussed the diagnosis of obesity with [name] today and Charlita agreed to give [name] permission to discuss obesity behavioral modification therapy today.  ASSESS: Erlene has the diagnosis of obesity and her BMI today is 30.54. Leeasia is in the action stage of change.   ADVISE: Alannis was educated on the multiple health risks of obesity as well as the benefit of weight loss to improve her health. She was advised of the need for long term treatment and the importance of lifestyle modifications to improve her current health and to decrease her risk of future health problems.  AGREE:  Multiple dietary modification options and treatment options were discussed and  Amalea agreed to follow the recommendations documented in the above note.  ARRANGE: Mystique was educated on the importance of frequent visits to treat obesity as outlined per CMS and USPSTF guidelines and agreed to schedule her next follow up appointment today.  Migdalia Dk, am acting as Location manager for [employer], DO  I have reviewed the above documentation for accuracy and completeness, and I agree with the above. -Jearld Lesch, DO

## 2018-07-08 ENCOUNTER — Encounter (INDEPENDENT_AMBULATORY_CARE_PROVIDER_SITE_OTHER): Payer: Self-pay | Admitting: Bariatrics

## 2018-07-17 ENCOUNTER — Encounter (INDEPENDENT_AMBULATORY_CARE_PROVIDER_SITE_OTHER): Payer: Self-pay

## 2018-07-22 ENCOUNTER — Other Ambulatory Visit: Payer: Self-pay

## 2018-07-22 ENCOUNTER — Ambulatory Visit (INDEPENDENT_AMBULATORY_CARE_PROVIDER_SITE_OTHER): Payer: Medicare HMO | Admitting: Bariatrics

## 2018-07-22 ENCOUNTER — Encounter (INDEPENDENT_AMBULATORY_CARE_PROVIDER_SITE_OTHER): Payer: Self-pay | Admitting: Bariatrics

## 2018-07-22 DIAGNOSIS — E7849 Other hyperlipidemia: Secondary | ICD-10-CM | POA: Diagnosis not present

## 2018-07-22 DIAGNOSIS — J302 Other seasonal allergic rhinitis: Secondary | ICD-10-CM

## 2018-07-22 DIAGNOSIS — E038 Other specified hypothyroidism: Secondary | ICD-10-CM | POA: Diagnosis not present

## 2018-07-22 DIAGNOSIS — E669 Obesity, unspecified: Secondary | ICD-10-CM

## 2018-07-22 DIAGNOSIS — Z6833 Body mass index (BMI) 33.0-33.9, adult: Secondary | ICD-10-CM | POA: Diagnosis not present

## 2018-07-22 DIAGNOSIS — E66811 Obesity, class 1: Secondary | ICD-10-CM

## 2018-07-23 NOTE — Progress Notes (Addendum)
Office: [phone]  /  Fax: [phone] TeleHealth Visit:  Leslie Benson has consented to this TeleHealth visit today via Skype. The patient is located at home, the provider is located at the [facility] office. The participants in this visit include the listed provider and patient and any and all parties involved.   HPI:   Chief Complaint: OBESITY Leslie Benson is here to discuss her progress with her obesity treatment plan. She is on the Category 2 plan and is following her eating plan approximately 80 % of the time. She states she is walking for 30 minutes 3 times a day. Leslie Benson thinks that she has either lost or maintained weight. She is 179 lbs on her scale. She is not stress eating and her appetite is normal. She is not having any issues with finding food. We were unable to weight the patient today for this TeleHealth visit.She feels as if she has lost or maintained weight since her last visit. She has lost 18 lbs since starting treatment with [name].  Hyperlipidemia Leslie Benson has hyperlipidemia and she is currently taking Atorvastatin. She has been trying to improve her cholesterol levels with intensive lifestyle modification including a low saturated fat diet, exercise and weight loss. Her hyperlipidemia is well controlled. She denies myalgias.  Hypothyroidism Leslie Benson has a diagnosis of hypothyroidism. She is taking Synthroid. Leslie Benson is well controlled. She denies hot or cold intolerance or palpitations.  Seasonal Allergies Leslie Benson has a diagnosis of seasonal allergies and she is taking Benadryl. She has Flonase at home.  ASSESSMENT AND PLAN:  Other hyperlipidemia  Other specified hypothyroidism  Seasonal allergies  Class 1 obesity with serious comorbidity and body mass index (BMI) of 33.0 to 33.9 in adult, unspecified obesity type  PLAN:  Hyperlipidemia Leslie Benson was informed of the American Heart Association Guidelines emphasizing intensive lifestyle  modifications as the first line treatment for hyperlipidemia. We discussed many lifestyle modifications today in depth, and Leslie Benson will continue to work on decreasing saturated fats such as fatty red meat, butter and many fried foods. She will also increase vegetables and lean protein in her diet and continue to work on exercise and weight loss efforts. Leslie Benson will continue Atorvastatin and follow up as directed.  Hypothyroidism Leslie Benson was informed of the importance of good thyroid control to help with weight loss efforts. She was also informed that supertheraputic thyroid levels are dangerous and will not improve weight loss results. Leslie Benson will continue her medications and follow up as directed.  Seasonal Allergies Leslie Benson will consider using Flonase. She will use Pataday allergy eye drops for seasonal allergies. She agrees to follow up with our clinic in 2 weeks.  Obesity Leslie Benson is currently in the action stage of change. As such, her goal is to continue with weight loss efforts She has agreed to follow the Category 2 plan Morning will continue walking for 30 minutes 3 times per day for weight loss and overall health benefits. We discussed the following Behavioral Modification Strategies today: increase H2O intake, no skipping meals, keeping healthy foods in the home, increasing lean protein intake, decreasing simple carbohydrates, increasing vegetables, decrease eating out and work on meal planning and easy cooking plans Leslie Benson will weigh on her scale at home. She will work on getting in adequate fruits and vegetables.  Leslie Benson has agreed to follow up with our clinic in 2 weeks. She was informed of the importance of frequent follow up visits to maximize her success with intensive lifestyle modifications for her multiple  health conditions.  ALLERGIES: Allergies  Allergen Reactions  . Codeine Nausea And Vomiting  . Compazine [Prochlorperazine Edisylate]     "feels weird"  . Sulfa  Antibiotics     Unknown reaction.    MEDICATIONS: Current Outpatient Medications on File Prior to Visit  Medication Sig Dispense Refill  . ALPRAZolam (XANAX) 1 MG tablet Take 2 mg by mouth 3 (three) times daily as needed for anxiety (anxiety). .    . atorvastatin (LIPITOR) 20 MG tablet TAKE 1 TABLET EVERY DAY 90 tablet 3  . Cholecalciferol (PA VITAMIN D-3 GUMMY PO) Take 1 Dose by mouth daily.    . Cholecalciferol (VITAMIN D) 2000 units tablet Take 1 tablet (2,000 Units total) by mouth daily. 30 tablet   . gabapentin (NEURONTIN) 600 MG tablet Take 1 tablet (600 mg total) by mouth 3 (three) times daily. (Patient taking differently: Take 1,200 mg by mouth 3 (three) times daily. Dr. Toy Care) 90 tablet 0  . levothyroxine (SYNTHROID, LEVOTHROID) 25 MCG tablet TAKE 1 TABLET ONE TIME DAILY BEFORE BREAKFAST 90 tablet 2  . metFORMIN (GLUCOPHAGE) 500 MG tablet Take 1 tablet (500 mg total) by mouth 2 (two) times daily with a meal. 60 tablet 0  . omega-3 acid ethyl esters (LOVAZA) 1 G capsule Take 1 g by mouth 2 (two) times daily.    Marland Kitchen omeprazole (PRILOSEC) 20 MG capsule Take 1 capsule (20 mg total) by mouth daily. 90 capsule 3  . QUEtiapine (SEROQUEL) 400 MG tablet Take 800 mg by mouth at bedtime.    . traMADol (ULTRAM) 50 MG tablet Take 2 tablets (100 mg total) by mouth every 6 (six) hours as needed for pain. (Patient taking differently: Take 100 mg by mouth every 6 (six) hours as needed for moderate pain. Dr. Toy Care) 30 tablet 0  . venlafaxine XR (EFFEXOR-XR) 75 MG 24 hr capsule Take 75 mg by mouth 3 (three) times daily.      Current Facility-Administered Medications on File Prior to Visit  Medication Dose Route Frequency Provider Last Rate Last Dose  . 0.9 %  sodium chloride infusion  500 mL Intravenous Once Irene Shipper, MD        PAST MEDICAL HISTORY: Past Medical History:  Diagnosis Date  . ADHD   . Anxiety   . Barrett esophagus   . Breast nodule    benign  . Cervical arthritis 09/22/2013  .  Depression   . Elevated liver enzymes   . Fatty liver   . Fibromyalgia   . Fibromyalgia   . GERD (gastroesophageal reflux disease)   . H/O hiatal hernia   . Headache(784.0)    migraines  . High cholesterol   . Lymphocytic colitis   . Migraines   . Pain   . Panic attack   . Paraesophageal hiatal hernia    repaired 2009  . Spinal stenosis   . Thyroid disease   . Thyroid disease     PAST SURGICAL HISTORY: Past Surgical History:  Procedure Laterality Date  . ANKLE SURGERY    . CHOLECYSTECTOMY  1980s?  . EUS N/A 11/19/2013   Procedure: ESOPHAGEAL ENDOSCOPIC ULTRASOUND (EUS) RADIAL;  Surgeon: Arta Silence, MD;  Location: [facility];  Service: Endoscopy;  Laterality: N/A;  . LAPAROSCOPIC NISSEN FUNDOPLICATION  56/38/7564  . LAPAROSCOPIC PARAESOPHAGEAL HERNIA REPAIR  03/05/2008   Dr Johney Maine  . TUBAL LIGATION  1990s?  Marland Kitchen VAGINAL HYSTERECTOMY  03/04/2001    SOCIAL HISTORY: Social History   Tobacco Use  . Smoking status:  Never Smoker  . Smokeless tobacco: Never Used  Substance Use Topics  . Alcohol use: Yes    Alcohol/week: 0.0 standard drinks    Comment: wine occassionally  . Drug use: No    FAMILY HISTORY: Family History  Problem Relation Age of Onset  . Alcohol abuse Other   . Colon cancer Other   . Elevated Lipids Other   . Heart disease Other   . Anxiety disorder Mother   . Obesity Mother   . High Cholesterol Mother   . Thyroid cancer Mother   . Depression Mother   . High Cholesterol Father   . Heart disease Father   . Alcoholism Father   . Colon cancer Maternal Aunt   . Stomach cancer Neg Hx   . Esophageal cancer Neg Hx     ROS: Review of Systems  Constitutional: Negative for weight loss.  Cardiovascular: Negative for palpitations.  Musculoskeletal: Negative for myalgias.  Endo/Heme/Allergies:       Negative for heat or cold intolerance    PHYSICAL EXAM: Pt in no acute distress  RECENT LABS AND TESTS: BMET    Component Value Date/Time    NA 141 05/29/2018 1310   K 4.2 05/29/2018 1310   CL 100 05/29/2018 1310   CO2 23 05/29/2018 1310   GLUCOSE 84 05/29/2018 1310   GLUCOSE 96 04/05/2017 1140   BUN 15 05/29/2018 1310   CREATININE 0.93 05/29/2018 1310   CALCIUM 9.5 05/29/2018 1310   GFRNONAA 67 05/29/2018 1310   GFRAA 78 05/29/2018 1310   Lab Results  Component Value Date   HGBA1C 5.3 05/29/2018   HGBA1C 5.5 01/23/2018   HGBA1C 5.5 04/05/2017   HGBA1C 5.5 08/27/2015   HGBA1C  09/30/2009    5.4 (NOTE)                                                                       According to the ADA Clinical Practice Recommendations for 2011, when HbA1c is used as a screening test:   >=6.5%   Diagnostic of Diabetes Mellitus           (if abnormal result  is confirmed)  5.7-6.4%   Increased risk of developing Diabetes Mellitus  References:Diagnosis and Classification of Diabetes Mellitus,Diabetes XNTZ,0017,49(SWHQP 1):S62-S69 and Standards of Medical Care in         Diabetes - 2011,Diabetes Care,2011,34  (Suppl 1):S11-S61.   Lab Results  Component Value Date   INSULIN 6.2 05/29/2018   INSULIN 10.4 01/23/2018   CBC    Component Value Date/Time   WBC 4.9 10/30/2017 1514   RBC 4.50 10/30/2017 1514   HGB 13.9 10/30/2017 1514   HCT 40.9 10/30/2017 1514   PLT 243.0 10/30/2017 1514   MCV 90.8 10/30/2017 1514   MCH 31.3 09/22/2015 2321   MCHC 34.0 10/30/2017 1514   RDW 14.0 10/30/2017 1514   LYMPHSABS 2.4 10/30/2017 1514   MONOABS 0.5 10/30/2017 1514   EOSABS 0.1 10/30/2017 1514   BASOSABS 0.0 10/30/2017 1514   Iron/TIBC/Ferritin/ %Sat    Component Value Date/Time   IRON 68 10/30/2017 1514   TIBC 319 10/30/2017 1514   FERRITIN 23 10/30/2017 1514   IRONPCTSAT 21 10/30/2017 1514   Lipid Panel  Component Value Date/Time   CHOL 195 02/15/2018 1042   TRIG 85.0 02/15/2018 1042   HDL 71.90 02/15/2018 1042   CHOLHDL 3 02/15/2018 1042   VLDL 17.0 02/15/2018 1042   LDLCALC 106 (H) 02/15/2018 1042   LDLDIRECT 168.0  04/05/2017 1140   Hepatic Function Panel     Component Value Date/Time   PROT 6.8 05/29/2018 1310   ALBUMIN 4.7 05/29/2018 1310   AST 21 05/29/2018 1310   ALT 21 05/29/2018 1310   ALKPHOS 115 05/29/2018 1310   BILITOT 0.3 05/29/2018 1310   BILIDIR 0.1 02/15/2018 1736   IBILI NOT CALCULATED 09/21/2013 2215      Component Value Date/Time   TSH 1.530 01/23/2018 1142   TSH 2.05 04/05/2017 1140   TSH 3.72 08/05/2015 0903     Ref. Range 05/29/2018 13:10  Vitamin D, 25-Hydroxy Latest Ref Range: 30.0 - 100.0 ng/mL 45.4     I, Doreene Nest, am acting as Location manager for [employer]. Owens Shark, DO  I have reviewed the above documentation for accuracy and completeness, and I agree with the above. -Jearld Lesch, DO

## 2018-08-05 ENCOUNTER — Encounter (INDEPENDENT_AMBULATORY_CARE_PROVIDER_SITE_OTHER): Payer: Self-pay | Admitting: Bariatrics

## 2018-08-05 ENCOUNTER — Ambulatory Visit (INDEPENDENT_AMBULATORY_CARE_PROVIDER_SITE_OTHER): Payer: Medicare HMO | Admitting: Bariatrics

## 2018-08-05 ENCOUNTER — Other Ambulatory Visit: Payer: Self-pay

## 2018-08-05 DIAGNOSIS — E7849 Other hyperlipidemia: Secondary | ICD-10-CM | POA: Diagnosis not present

## 2018-08-05 DIAGNOSIS — Z6830 Body mass index (BMI) 30.0-30.9, adult: Secondary | ICD-10-CM | POA: Diagnosis not present

## 2018-08-05 DIAGNOSIS — J302 Other seasonal allergic rhinitis: Secondary | ICD-10-CM

## 2018-08-05 DIAGNOSIS — E669 Obesity, unspecified: Secondary | ICD-10-CM

## 2018-08-05 NOTE — Progress Notes (Signed)
Office: [phone]  /  Fax: [phone] TeleHealth Visit:  Leslie Benson has verbally consented to this TeleHealth visit today. The patient is located at home, the provider is located at the [employer] [facility] office. The participants in this visit include the listed provider and patient and any and all parties involved. The visit was conducted today via FaceTime.  HPI:   Chief Complaint: OBESITY Leslie Benson is here to discuss her progress with her obesity treatment plan. She is on the Category 2 plan and is following her eating plan approximately 70 % of the time. She states she is walking for 30 minutes 3 times per week. Leslie Benson thinks that she has maintained her weight. She is having more migraines (allergies). She has been stressed. Her husband has sabotaged her some with snack foods. We were unable to weigh the patient today for this TeleHealth visit. She feels as if she has maintained weight since her last visit. She has lost 18 lbs since starting treatment with [name].  Seasonal Allergies Leslie Benson has a diagnosis of seasonal allergies and she is taking OTC Benadryl.  Hyperlipidemia Leslie Benson has hyperlipidemia and she is currently taking Lipitor. She has been trying to improve her cholesterol levels with intensive lifestyle modification including a low saturated fat diet, exercise and weight loss. She denies  myalgias.  ASSESSMENT AND PLAN:  Seasonal allergies  Other hyperlipidemia  Class 1 obesity with serious comorbidity and body mass index (BMI) of 30.0 to 30.9 in adult, unspecified obesity type  PLAN:  Seasonal Allergies Leslie Benson can use OTC Flonase and antihistamine if needed. She agrees to follow up as directed.  Hyperlipidemia Leslie Benson was informed of the American Heart Association Guidelines emphasizing intensive lifestyle modifications as the first line treatment for hyperlipidemia. We discussed many lifestyle modifications today in depth, and Leslie Benson will  continue to work on decreasing saturated fats such as fatty red meat, butter and many fried foods. She will also increase vegetables and lean protein in her diet and continue to work on exercise and weight loss efforts. She will continue her medications and we will monitor over time.  Obesity Leslie Benson is currently in the action stage of change. As such, her goal is to continue with weight loss efforts She has agreed to follow the Category 2 plan Leslie Benson will continue to walk for weight loss and overall health benefits. We discussed the following Behavioral Modification Strategies today: planning for success, increase H2O intake, no skipping meals, keeping healthy foods in the home, increasing lean protein intake, decreasing simple carbohydrates, increasing vegetables, decrease eating out and work on meal planning and easy cooking plans Leslie Benson will try not to skip meals.  Leslie Benson has agreed to follow up with our clinic in 2 weeks. She was informed of the importance of frequent follow up visits to maximize her success with intensive lifestyle modifications for her multiple health conditions.  ALLERGIES: Allergies  Allergen Reactions  . Codeine Nausea And Vomiting  . Compazine [Prochlorperazine Edisylate]     "feels weird"  . Sulfa Antibiotics     Unknown reaction.    MEDICATIONS: Current Outpatient Medications on File Prior to Visit  Medication Sig Dispense Refill  . ALPRAZolam (XANAX) 1 MG tablet Take 2 mg by mouth 3 (three) times daily as needed for anxiety (anxiety). .    . atorvastatin (LIPITOR) 20 MG tablet TAKE 1 TABLET EVERY DAY 90 tablet 3  . Cholecalciferol (PA VITAMIN D-3 GUMMY PO) Take 1 Dose by mouth daily.    Marland Kitchen  Cholecalciferol (VITAMIN D) 2000 units tablet Take 1 tablet (2,000 Units total) by mouth daily. 30 tablet   . gabapentin (NEURONTIN) 600 MG tablet Take 1 tablet (600 mg total) by mouth 3 (three) times daily. (Patient taking differently: Take 1,200 mg by mouth 3 (three)  times daily. Dr. Toy Care) 90 tablet 0  . levothyroxine (SYNTHROID, LEVOTHROID) 25 MCG tablet TAKE 1 TABLET ONE TIME DAILY BEFORE BREAKFAST 90 tablet 2  . metFORMIN (GLUCOPHAGE) 500 MG tablet Take 1 tablet (500 mg total) by mouth 2 (two) times daily with a meal. 60 tablet 0  . omega-3 acid ethyl esters (LOVAZA) 1 G capsule Take 1 g by mouth 2 (two) times daily.    Marland Kitchen omeprazole (PRILOSEC) 20 MG capsule Take 1 capsule (20 mg total) by mouth daily. 90 capsule 3  . QUEtiapine (SEROQUEL) 400 MG tablet Take 800 mg by mouth at bedtime.    . traMADol (ULTRAM) 50 MG tablet Take 2 tablets (100 mg total) by mouth every 6 (six) hours as needed for pain. (Patient taking differently: Take 100 mg by mouth every 6 (six) hours as needed for moderate pain. Dr. Toy Care) 30 tablet 0  . venlafaxine XR (EFFEXOR-XR) 75 MG 24 hr capsule Take 75 mg by mouth 3 (three) times daily.      Current Facility-Administered Medications on File Prior to Visit  Medication Dose Route Frequency Provider Last Rate Last Dose  . 0.9 %  sodium chloride infusion  500 mL Intravenous Once Irene Shipper, MD        PAST MEDICAL HISTORY: Past Medical History:  Diagnosis Date  . ADHD   . Anxiety   . Barrett esophagus   . Breast nodule    benign  . Cervical arthritis 09/22/2013  . Depression   . Elevated liver enzymes   . Fatty liver   . Fibromyalgia   . Fibromyalgia   . GERD (gastroesophageal reflux disease)   . H/O hiatal hernia   . Headache(784.0)    migraines  . High cholesterol   . Lymphocytic colitis   . Migraines   . Pain   . Panic attack   . Paraesophageal hiatal hernia    repaired 2009  . Spinal stenosis   . Thyroid disease   . Thyroid disease     PAST SURGICAL HISTORY: Past Surgical History:  Procedure Laterality Date  . ANKLE SURGERY    . CHOLECYSTECTOMY  1980s?  . EUS N/A 11/19/2013   Procedure: ESOPHAGEAL ENDOSCOPIC ULTRASOUND (EUS) RADIAL;  Surgeon: Arta Silence, MD;  Location: [facility];  Service:  Endoscopy;  Laterality: N/A;  . LAPAROSCOPIC NISSEN FUNDOPLICATION  [id number]/83/[id number]  . LAPAROSCOPIC PARAESOPHAGEAL HERNIA REPAIR  03/05/2008   Dr Johney Maine  . TUBAL LIGATION  1990s?  Marland Kitchen VAGINAL HYSTERECTOMY  03/04/2001    SOCIAL HISTORY: Social History   Tobacco Use  . Smoking status: Never Smoker  . Smokeless tobacco: Never Used  Substance Use Topics  . Alcohol use: Yes    Alcohol/week: 0.0 standard drinks    Comment: wine occassionally  . Drug use: No    FAMILY HISTORY: Family History  Problem Relation Age of Onset  . Alcohol abuse Other   . Colon cancer Other   . Elevated Lipids Other   . Heart disease Other   . Anxiety disorder Mother   . Obesity Mother   . High Cholesterol Mother   . Thyroid cancer Mother   . Depression Mother   . High Cholesterol Father   . Heart  disease Father   . Alcoholism Father   . Colon cancer Maternal Aunt   . Stomach cancer Neg Hx   . Esophageal cancer Neg Hx     ROS: Review of Systems  Constitutional: Negative for weight loss.  Musculoskeletal: Negative for myalgias.    PHYSICAL EXAM: Pt in no acute distress  RECENT LABS AND TESTS: BMET    Component Value Date/Time   NA 141 05/29/2018 1310   K 4.2 05/29/2018 1310   CL 100 05/29/2018 1310   CO2 23 05/29/2018 1310   GLUCOSE 84 05/29/2018 1310   GLUCOSE 96 04/05/2017 1140   BUN 15 05/29/2018 1310   CREATININE 0.93 05/29/2018 1310   CALCIUM 9.5 05/29/2018 1310   GFRNONAA 67 05/29/2018 1310   GFRAA 78 05/29/2018 1310   Lab Results  Component Value Date   HGBA1C 5.3 05/29/2018   HGBA1C 5.5 01/23/2018   HGBA1C 5.5 04/05/2017   HGBA1C 5.5 08/27/2015   HGBA1C  09/30/2009    5.4 (NOTE)                                                                       According to the ADA Clinical Practice Recommendations for 2011, when HbA1c is used as a screening test:   >=6.5%   Diagnostic of Diabetes Mellitus           (if abnormal result  is confirmed)  5.7-6.4%   Increased risk of  developing Diabetes Mellitus  References:Diagnosis and Classification of Diabetes Mellitus,Diabetes MCNO,7096,28(ZMOQH 1):S62-S69 and Standards of Medical Care in         Diabetes - 2011,Diabetes Care,2011,34  (Suppl 1):S11-S61.   Lab Results  Component Value Date   INSULIN 6.2 05/29/2018   INSULIN 10.4 01/23/2018   CBC    Component Value Date/Time   WBC 4.9 10/30/2017 1514   RBC 4.50 10/30/2017 1514   HGB 13.9 10/30/2017 1514   HCT 40.9 10/30/2017 1514   PLT 243.0 10/30/2017 1514   MCV 90.8 10/30/2017 1514   MCH 31.3 09/22/2015 2321   MCHC 34.0 10/30/2017 1514   RDW 14.0 10/30/2017 1514   LYMPHSABS 2.4 10/30/2017 1514   MONOABS 0.5 10/30/2017 1514   EOSABS 0.1 10/30/2017 1514   BASOSABS 0.0 10/30/2017 1514   Iron/TIBC/Ferritin/ %Sat    Component Value Date/Time   IRON 68 10/30/2017 1514   TIBC 319 10/30/2017 1514   FERRITIN 23 10/30/2017 1514   IRONPCTSAT 21 10/30/2017 1514   Lipid Panel     Component Value Date/Time   CHOL 195 02/15/2018 1042   TRIG 85.0 02/15/2018 1042   HDL 71.90 02/15/2018 1042   CHOLHDL 3 02/15/2018 1042   VLDL 17.0 02/15/2018 1042   LDLCALC 106 (H) 02/15/2018 1042   LDLDIRECT 168.0 04/05/2017 1140   Hepatic Function Panel     Component Value Date/Time   PROT 6.8 05/29/2018 1310   ALBUMIN 4.7 05/29/2018 1310   AST 21 05/29/2018 1310   ALT 21 05/29/2018 1310   ALKPHOS 115 05/29/2018 1310   BILITOT 0.3 05/29/2018 1310   BILIDIR 0.1 02/15/2018 1736   IBILI NOT CALCULATED 09/21/2013 2215      Component Value Date/Time   TSH 1.530 01/23/2018 1142   TSH 2.05 04/05/2017 1140  TSH 3.72 08/05/2015 0903    Results for ANGALA, HILGERS (MRN [id number]) as of 08/05/2018 16:02  Ref. Range 05/29/2018 13:10  Vitamin D, 25-Hydroxy Latest Ref Range: 30.0 - 100.0 ng/mL 45.4    I, Doreene Nest, am acting as Location manager for [employer]. Owens Shark, DO  I have reviewed the above documentation for accuracy and completeness, and I agree with the  above. -Jearld Lesch, DO

## 2018-08-19 ENCOUNTER — Other Ambulatory Visit: Payer: Self-pay

## 2018-08-19 ENCOUNTER — Encounter (INDEPENDENT_AMBULATORY_CARE_PROVIDER_SITE_OTHER): Payer: Self-pay | Admitting: Bariatrics

## 2018-08-19 ENCOUNTER — Ambulatory Visit (INDEPENDENT_AMBULATORY_CARE_PROVIDER_SITE_OTHER): Payer: Medicare HMO | Admitting: Bariatrics

## 2018-08-19 DIAGNOSIS — E669 Obesity, unspecified: Secondary | ICD-10-CM

## 2018-08-19 DIAGNOSIS — M797 Fibromyalgia: Secondary | ICD-10-CM | POA: Diagnosis not present

## 2018-08-19 DIAGNOSIS — Z6830 Body mass index (BMI) 30.0-30.9, adult: Secondary | ICD-10-CM | POA: Diagnosis not present

## 2018-08-19 DIAGNOSIS — E8881 Metabolic syndrome: Secondary | ICD-10-CM | POA: Diagnosis not present

## 2018-08-19 DIAGNOSIS — E7849 Other hyperlipidemia: Secondary | ICD-10-CM

## 2018-08-19 MED ORDER — METFORMIN HCL 500 MG PO TABS: 500.0000 mg | ORAL_TABLET | Freq: Two times a day (BID) | ORAL | 0 refills | Status: DC

## 2018-08-19 NOTE — Progress Notes (Signed)
Office: [phone]  /  Fax: [phone] TeleHealth Visit:  Leslie Benson has verbally consented to this TeleHealth visit today. The patient is located at home, the provider is located at the [employer] [facility] office. The participants in this visit include the listed provider and patient and any and all parties involved. The visit was conducted today via Skype.  HPI:   Chief Complaint: OBESITY Leslie Benson is here to discuss her progress with her obesity treatment plan. She is on the Category 2 plan and is following her eating plan approximately 80 % of the time. She states she is walking for 30 minutes 7 times per week. Leslie Benson states that she has lost 1 pound (weight 180 lbs). She is not stress eating. Leslie Benson is getting in adequate protein and water. We were unable to weigh the patient today for this TeleHealth visit. She feels as if she has lost weight since her last visit. She has lost 16 lbs since starting treatment with [name].  Insulin Resistance Leslie Benson has a diagnosis of insulin resistance based on her elevated fasting insulin level >5. Although Leslie Benson's blood glucose readings are still under good control, insulin resistance puts her at greater risk of metabolic syndrome and diabetes. She is taking metformin currently and continues to work on diet and exercise to decrease risk of diabetes. Leslie Benson denies polyphagia.  Hyperlipidemia Leslie Benson has hyperlipidemia and she is taking Lipitor. She has been trying to improve her cholesterol levels with intensive lifestyle modification including a low saturated fat diet, exercise and weight loss. She denies any chest pain or myalgias.  Fibromyalgia Leslie Benson states that she is "sore". She takes Effexor -XR, Seroquel and Ultram.  ASSESSMENT AND PLAN:  Insulin resistance - Plan: metFORMIN (GLUCOPHAGE) 500 MG tablet  Other hyperlipidemia  Fibromyalgia  Class 1 obesity with serious comorbidity and body mass index (BMI) of 30.0 to  30.9 in adult, unspecified obesity type  PLAN:  Insulin Resistance Leslie Benson will continue to work on weight loss, exercise, increasing lean protein and decreasing simple carbohydrates in her diet to help decrease the risk of diabetes. We dicussed metformin including benefits and risks. She was informed that eating too many simple carbohydrates or too many calories at one sitting increases the likelihood of GI side effects. Leslie Benson agrees to continue metformin 500 mg BID #60 with no refills and follow up with [name] as directed to monitor her progress.  Hyperlipidemia [name] was informed of the American Heart Association Guidelines emphasizing intensive lifestyle modifications as the first line treatment for hyperlipidemia. We discussed many lifestyle modifications today in depth, and Leslie Benson will continue to work on decreasing saturated fats such as fatty red meat, butter and many fried foods. She will also increase PUFA's, MUFA's, vegetables and lean protein in her diet and continue to work on exercise and weight loss efforts. We will recheck labs in the future.  Fibromyalgia Cantrell will continue her medications as prescribed. She will continue exercise and follow up with our clinic in 2 weeks.  Obesity Leslie Benson is currently in the action stage of change. As such, her goal is to continue with weight loss efforts She has agreed to follow the Category 2 plan Leslie Benson will continue her exercise regimen for weight loss and overall health benefits. We discussed the following Behavioral Modification Strategies today: increase H2O intake, no skipping meals, keeping healthy foods in the home, increasing lean protein intake, decreasing simple carbohydrates, increasing vegetables, decrease eating out and work on meal planning and easy cooking plans [employer]  will weigh at home before each visit.  Leslie Benson has agreed to follow up with our clinic in 2 weeks. She was informed of the importance of frequent follow up  visits to maximize her success with intensive lifestyle modifications for her multiple health conditions.  ALLERGIES: Allergies  Allergen Reactions  . Codeine Nausea And Vomiting  . Compazine [Prochlorperazine Edisylate]     "feels weird"  . Sulfa Antibiotics     Unknown reaction.    MEDICATIONS: Current Outpatient Medications on File Prior to Visit  Medication Sig Dispense Refill  . ALPRAZolam (XANAX) 1 MG tablet Take 2 mg by mouth 3 (three) times daily as needed for anxiety (anxiety). .    . atorvastatin (LIPITOR) 20 MG tablet TAKE 1 TABLET EVERY DAY 90 tablet 3  . Cholecalciferol (PA VITAMIN D-3 GUMMY PO) Take 1 Dose by mouth daily.    . Cholecalciferol (VITAMIN D) 2000 units tablet Take 1 tablet (2,000 Units total) by mouth daily. 30 tablet   . gabapentin (NEURONTIN) 600 MG tablet Take 1 tablet (600 mg total) by mouth 3 (three) times daily. (Patient taking differently: Take 1,200 mg by mouth 3 (three) times daily. Dr. Toy Care) 90 tablet 0  . levothyroxine (SYNTHROID, LEVOTHROID) 25 MCG tablet TAKE 1 TABLET ONE TIME DAILY BEFORE BREAKFAST 90 tablet 2  . omega-3 acid ethyl esters (LOVAZA) 1 G capsule Take 1 g by mouth 2 (two) times daily.    Leslie Benson omeprazole (PRILOSEC) 20 MG capsule Take 1 capsule (20 mg total) by mouth daily. 90 capsule 3  . QUEtiapine (SEROQUEL) 400 MG tablet Take 800 mg by mouth at bedtime.    . traMADol (ULTRAM) 50 MG tablet Take 2 tablets (100 mg total) by mouth every 6 (six) hours as needed for pain. (Patient taking differently: Take 100 mg by mouth every 6 (six) hours as needed for moderate pain. Dr. Toy Care) 30 tablet 0  . venlafaxine XR (EFFEXOR-XR) 75 MG 24 hr capsule Take 75 mg by mouth 3 (three) times daily.      Current Facility-Administered Medications on File Prior to Visit  Medication Dose Route Frequency Provider Last Rate Last Dose  . 0.9 %  sodium chloride infusion  500 mL Intravenous Once Irene Shipper, MD        PAST MEDICAL HISTORY: Past Medical  History:  Diagnosis Date  . ADHD   . Anxiety   . Barrett esophagus   . Breast nodule    benign  . Cervical arthritis 09/22/2013  . Depression   . Elevated liver enzymes   . Fatty liver   . Fibromyalgia   . Fibromyalgia   . GERD (gastroesophageal reflux disease)   . H/O hiatal hernia   . Headache(784.0)    migraines  . High cholesterol   . Lymphocytic colitis   . Migraines   . Pain   . Panic attack   . Paraesophageal hiatal hernia    repaired 2009  . Spinal stenosis   . Thyroid disease   . Thyroid disease     PAST SURGICAL HISTORY: Past Surgical History:  Procedure Laterality Date  . ANKLE SURGERY    . CHOLECYSTECTOMY  1980s?  . EUS N/A 11/19/2013   Procedure: ESOPHAGEAL ENDOSCOPIC ULTRASOUND (EUS) RADIAL;  Surgeon: Arta Silence, MD;  Location: [facility];  Service: Endoscopy;  Laterality: N/A;  . LAPAROSCOPIC NISSEN FUNDOPLICATION  34/74/2595  . LAPAROSCOPIC PARAESOPHAGEAL HERNIA REPAIR  03/05/2008   Dr Johney Maine  . TUBAL LIGATION  1990s?  Leslie Benson VAGINAL HYSTERECTOMY  03/04/2001    SOCIAL HISTORY: Social History   Tobacco Use  . Smoking status: Never Smoker  . Smokeless tobacco: Never Used  Substance Use Topics  . Alcohol use: Yes    Alcohol/week: 0.0 standard drinks    Comment: wine occassionally  . Drug use: No    FAMILY HISTORY: Family History  Problem Relation Age of Onset  . Alcohol abuse Other   . Colon cancer Other   . Elevated Lipids Other   . Heart disease Other   . Anxiety disorder Mother   . Obesity Mother   . High Cholesterol Mother   . Thyroid cancer Mother   . Depression Mother   . High Cholesterol Father   . Heart disease Father   . Alcoholism Father   . Colon cancer Maternal Aunt   . Stomach cancer Neg Hx   . Esophageal cancer Neg Hx     ROS: Review of Systems  Constitutional: Positive for weight loss.  Cardiovascular: Negative for chest pain.  Musculoskeletal: Negative for myalgias.  Endo/Heme/Allergies:       Negative for  polyphagia    PHYSICAL EXAM: Pt in no acute distress  RECENT LABS AND TESTS: BMET    Component Value Date/Time   NA 141 05/29/2018 1310   K 4.2 05/29/2018 1310   CL 100 05/29/2018 1310   CO2 23 05/29/2018 1310   GLUCOSE 84 05/29/2018 1310   GLUCOSE 96 04/05/2017 1140   BUN 15 05/29/2018 1310   CREATININE 0.93 05/29/2018 1310   CALCIUM 9.5 05/29/2018 1310   GFRNONAA 67 05/29/2018 1310   GFRAA 78 05/29/2018 1310   Lab Results  Component Value Date   HGBA1C 5.3 05/29/2018   HGBA1C 5.5 01/23/2018   HGBA1C 5.5 04/05/2017   HGBA1C 5.5 08/27/2015   HGBA1C  09/30/2009    5.4 (NOTE)                                                                       According to the ADA Clinical Practice Recommendations for 2011, when HbA1c is used as a screening test:   >=6.5%   Diagnostic of Diabetes Mellitus           (if abnormal result  is confirmed)  5.7-6.4%   Increased risk of developing Diabetes Mellitus  References:Diagnosis and Classification of Diabetes Mellitus,Diabetes JXBJ,4782,95(AOZHY 1):S62-S69 and Standards of Medical Care in         Diabetes - 2011,Diabetes Care,2011,34  (Suppl 1):S11-S61.   Lab Results  Component Value Date   INSULIN 6.2 05/29/2018   INSULIN 10.4 01/23/2018   CBC    Component Value Date/Time   WBC 4.9 10/30/2017 1514   RBC 4.50 10/30/2017 1514   HGB 13.9 10/30/2017 1514   HCT 40.9 10/30/2017 1514   PLT 243.0 10/30/2017 1514   MCV 90.8 10/30/2017 1514   MCH 31.3 09/22/2015 2321   MCHC 34.0 10/30/2017 1514   RDW 14.0 10/30/2017 1514   LYMPHSABS 2.4 10/30/2017 1514   MONOABS 0.5 10/30/2017 1514   EOSABS 0.1 10/30/2017 1514   BASOSABS 0.0 10/30/2017 1514   Iron/TIBC/Ferritin/ %Sat    Component Value Date/Time   IRON 68 10/30/2017 1514   TIBC 319 10/30/2017 1514   FERRITIN 23 10/30/2017  1514   IRONPCTSAT 21 10/30/2017 1514   Lipid Panel     Component Value Date/Time   CHOL 195 02/15/2018 1042   TRIG 85.0 02/15/2018 1042   HDL 71.90  02/15/2018 1042   CHOLHDL 3 02/15/2018 1042   VLDL 17.0 02/15/2018 1042   LDLCALC 106 (H) 02/15/2018 1042   LDLDIRECT 168.0 04/05/2017 1140   Hepatic Function Panel     Component Value Date/Time   PROT 6.8 05/29/2018 1310   ALBUMIN 4.7 05/29/2018 1310   AST 21 05/29/2018 1310   ALT 21 05/29/2018 1310   ALKPHOS 115 05/29/2018 1310   BILITOT 0.3 05/29/2018 1310   BILIDIR 0.1 02/15/2018 1736   IBILI NOT CALCULATED 09/21/2013 2215      Component Value Date/Time   TSH 1.530 01/23/2018 1142   TSH 2.05 04/05/2017 1140   TSH 3.72 08/05/2015 0903    Results for SUJATA, MAINES (MRN [id number]) as of 08/19/2018 16:37  Ref. Range 05/29/2018 13:10  Vitamin D, 25-Hydroxy Latest Ref Range: 30.0 - 100.0 ng/mL 45.4   I, Doreene Nest, am acting as Location manager for [employer]. Owens Shark, DO  I have reviewed the above documentation for accuracy and completeness, and I agree with the above. -Jearld Lesch, DO

## 2018-08-29 ENCOUNTER — Encounter: Payer: Self-pay | Admitting: Internal Medicine

## 2018-08-29 ENCOUNTER — Other Ambulatory Visit: Payer: Self-pay

## 2018-08-29 ENCOUNTER — Ambulatory Visit (INDEPENDENT_AMBULATORY_CARE_PROVIDER_SITE_OTHER): Payer: Medicare HMO | Admitting: Internal Medicine

## 2018-08-29 VITALS — Ht 64.0 in | Wt 180.0 lb

## 2018-08-29 DIAGNOSIS — R1310 Dysphagia, unspecified: Secondary | ICD-10-CM | POA: Diagnosis not present

## 2018-08-29 DIAGNOSIS — K222 Esophageal obstruction: Secondary | ICD-10-CM | POA: Diagnosis not present

## 2018-08-29 DIAGNOSIS — K219 Gastro-esophageal reflux disease without esophagitis: Secondary | ICD-10-CM

## 2018-08-29 DIAGNOSIS — Z1211 Encounter for screening for malignant neoplasm of colon: Secondary | ICD-10-CM | POA: Diagnosis not present

## 2018-08-29 NOTE — Patient Instructions (Signed)
If you are age 60 or older, your body mass index should be between 23-30. Your Body mass index is 30.9 kg/m. If this is out of the aforementioned range listed, please consider follow up with your Primary Care Provider.  If you are age 37 or younger, your body mass index should be between 19-25. Your Body mass index is 30.9 kg/m. If this is out of the aformentioned range listed, please consider follow up with your Primary Care Provider.   Continue Omeprazole daily.  Reflux precautions below.  You have been placed on recall for colonoscopy October 2020.  Follow up as needed.  Thank you for choosing me and [facility].   Scarlette Shorts, MD   Gastroesophageal Reflux Disease, Adult Gastroesophageal reflux (GER) happens when acid from the stomach flows up into the tube that connects the mouth and the stomach (esophagus). Normally, food travels down the esophagus and stays in the stomach to be digested. However, when a person has GER, food and stomach acid sometimes move back up into the esophagus. If this becomes a more serious problem, the person may be diagnosed with a disease called gastroesophageal reflux disease (GERD). GERD occurs when the reflux:  Happens often.  Causes frequent or severe symptoms.  Causes problems such as damage to the esophagus. When stomach acid comes in contact with the esophagus, the acid may cause soreness (inflammation) in the esophagus. Over time, GERD may create small holes (ulcers) in the lining of the esophagus. What are the causes? This condition is caused by a problem with the muscle between the esophagus and the stomach (lower esophageal sphincter, or LES). Normally, the LES muscle closes after food passes through the esophagus to the stomach. When the LES is weakened or abnormal, it does not close properly, and that allows food and stomach acid to go back up into the esophagus. The LES can be weakened by certain dietary substances, medicines,  and medical conditions, including:  Tobacco use.  Pregnancy.  Having a hiatal hernia.  Alcohol use.  Certain foods and beverages, such as coffee, chocolate, onions, and peppermint. What increases the risk? You are more likely to develop this condition if you:  Have an increased body weight.  Have a connective tissue disorder.  Use NSAID medicines. What are the signs or symptoms? Symptoms of this condition include:  Heartburn.  Difficult or painful swallowing.  The feeling of having a lump in the throat.  Abitter taste in the mouth.  Bad breath.  Having a large amount of saliva.  Having an upset or bloated stomach.  Belching.  Chest pain. Different conditions can cause chest pain. Make sure you see your health care provider if you experience chest pain.  Shortness of breath or wheezing.  Ongoing (chronic) cough or a night-time cough.  Wearing away of tooth enamel.  Weight loss. How is this diagnosed? Your health care provider will take a medical history and perform a physical exam. To determine if you have mild or severe GERD, your health care provider may also monitor how you respond to treatment. You may also have tests, including:  A test to examine your stomach and esophagus with a small camera (endoscopy).  A test thatmeasures the acidity level in your esophagus.  A test thatmeasures how much pressure is on your esophagus.  A barium swallow or modified barium swallow test to show the shape, size, and functioning of your esophagus. How is this treated? The goal of treatment is to help relieve your symptoms  and to prevent complications. Treatment for this condition may vary depending on how severe your symptoms are. Your health care provider may recommend:  Changes to your diet.  Medicine.  Surgery. Follow these instructions at home: Eating and drinking   Follow a diet as recommended by your health care provider. This may involve avoiding  foods and drinks such as: ? Coffee and tea (with or without caffeine). ? Drinks that containalcohol. ? Energy drinks and sports drinks. ? Carbonated drinks or sodas. ? Chocolate and cocoa. ? Peppermint and mint flavorings. ? Garlic and onions. ? Horseradish. ? Spicy and acidic foods, including peppers, chili powder, curry powder, vinegar, hot sauces, and barbecue sauce. ? Citrus fruit juices and citrus fruits, such as oranges, lemons, and limes. ? Tomato-based foods, such as red sauce, chili, salsa, and pizza with red sauce. ? Fried and fatty foods, such as donuts, french fries, potato chips, and high-fat dressings. ? High-fat meats, such as hot dogs and fatty cuts of red and white meats, such as rib eye steak, sausage, ham, and bacon. ? High-fat dairy items, such as whole milk, butter, and cream cheese.  Eat small, frequent meals instead of large meals.  Avoid drinking large amounts of liquid with your meals.  Avoid eating meals during the 2-3 hours before bedtime.  Avoid lying down right after you eat.  Do not exercise right after you eat. Lifestyle   Do not use any products that contain nicotine or tobacco, such as cigarettes, e-cigarettes, and chewing tobacco. If you need help quitting, ask your health care provider.  Try to reduce your stress by using methods such as yoga or meditation. If you need help reducing stress, ask your health care provider.  If you are overweight, reduce your weight to an amount that is healthy for you. Ask your health care provider for guidance about a safe weight loss goal. General instructions  Pay attention to any changes in your symptoms.  Take over-the-counter and prescription medicines only as told by your health care provider. Do not take aspirin, ibuprofen, or other NSAIDs unless your health care provider told you to do so.  Wear loose-fitting clothing. Do not wear anything tight around your waist that causes pressure on your  abdomen.  Raise (elevate) the head of your bed about 6 inches (15 cm).  Avoid bending over if this makes your symptoms worse.  Keep all follow-up visits as told by your health care provider. This is important. Contact a health care provider if:  You have: ? New symptoms. ? Unexplained weight loss. ? Difficulty swallowing or it hurts to swallow. ? Wheezing or a persistent cough. ? A hoarse voice.  Your symptoms do not improve with treatment. Get help right away if you:  Have pain in your arms, neck, jaw, teeth, or back.  Feel sweaty, dizzy, or light-headed.  Have chest pain or shortness of breath.  Vomit and your vomit looks like blood or coffee grounds.  Faint.  Have stool that is bloody or black.  Cannot swallow, drink, or eat. Summary  Gastroesophageal reflux happens when acid from the stomach flows up into the esophagus. GERD is a disease in which the reflux happens often, causes frequent or severe symptoms, or causes problems such as damage to the esophagus.  Treatment for this condition may vary depending on how severe your symptoms are. Your health care provider may recommend diet and lifestyle changes, medicine, or surgery.  Contact a health care provider if you have new  or worsening symptoms.  Take over-the-counter and prescription medicines only as told by your health care provider. Do not take aspirin, ibuprofen, or other NSAIDs unless your health care provider told you to do so.  Keep all follow-up visits as told by your health care provider. This is important. This information is not intended to replace advice given to you by your health care provider. Make sure you discuss any questions you have with your health care provider. Document Released: 01/18/2005 Document Revised: 10/17/2017 Document Reviewed: 10/17/2017 Elsevier Interactive Patient Education  2019 [employer].

## 2018-08-29 NOTE — Progress Notes (Signed)
HISTORY OF PRESENT ILLNESS:  Leslie Benson is a 58 y.o. female with a history of GERD, Barrett's with regression, paraesophageal hernia status post repair 2009, irritable bowel syndrome, reported history of lymphocytic colitis, fatty liver, and prior colonoscopy who presents today via telemedicine during the coronavirus pandemic for follow-up regarding management of dysphasia after endoscopy.  Patient was last seen in the office March 19, 2018 regarding intermittent solid food dysphasia of 2 months duration.  She subsequently underwent upper endoscopy April 23, 2018.  She was found to have mild esophagitis with stricture and evidence of prior hiatal hernia repair with intact wrap.  She was dilated with 54 French Maloney dilator and prescribed omeprazole 20 mg daily.  She follows up at this time.  The patient tells me that she has been compliant with PPI therapy.  No classic reflux symptoms.  However she has continued with intermittent solid food dysphagia to bread and meats without change.  No acute food impactions.  GI review of systems is otherwise negative.  Alternating bowel habits are without change.  Her last colonoscopy elsewhere was October 2010.  No interval medical problems to report since her endoscopy.  REVIEW OF SYSTEMS:  All non-GI ROS unless otherwise stated in the HPI negative except for anxiety, muscle aches, headaches, depression  Past Medical History:  Diagnosis Date  . ADHD   . Anxiety   . Barrett esophagus   . Breast nodule    benign  . Cervical arthritis 09/22/2013  . Depression   . Elevated liver enzymes   . Fatty liver   . Fibromyalgia   . Fibromyalgia   . GERD (gastroesophageal reflux disease)   . H/O hiatal hernia   . Headache(784.0)    migraines  . High cholesterol   . Lymphocytic colitis   . Migraines   . Pain   . Panic attack   . Paraesophageal hiatal hernia    repaired 2009  . Spinal stenosis   . Thyroid disease   . Thyroid disease     Past  Surgical History:  Procedure Laterality Date  . ANKLE SURGERY    . CHOLECYSTECTOMY  1980s?  . EUS N/A 11/19/2013   Procedure: ESOPHAGEAL ENDOSCOPIC ULTRASOUND (EUS) RADIAL;  Surgeon: Arta Silence, MD;  Location: [facility];  Service: Endoscopy;  Laterality: N/A;  . LAPAROSCOPIC NISSEN FUNDOPLICATION  [id number]  . LAPAROSCOPIC PARAESOPHAGEAL HERNIA REPAIR  03/05/2008   Dr Johney Maine  . TUBAL LIGATION  1990s?  Marland Kitchen VAGINAL HYSTERECTOMY  03/04/2001    Social History Leslie Benson  reports that she has never smoked. She has never used smokeless tobacco. She reports current alcohol use. She reports that she does not use drugs.  family history includes Alcohol abuse in an other family member; Alcoholism in her father; Anxiety disorder in her mother; Colon cancer in her maternal aunt and another family member; Depression in her mother; Elevated Lipids in an other family member; Heart disease in her father and another family member; High Cholesterol in her father and mother; Obesity in her mother; Thyroid cancer in her mother.  Allergies  Allergen Reactions  . Codeine Nausea And Vomiting  . Compazine [Prochlorperazine Edisylate]     "feels weird"  . Sulfa Antibiotics     Unknown reaction.       PHYSICAL EXAMINATION: No physical examination with telehealth visit    ASSESSMENT:  1.  Dysphasia secondary to distal esophageal stricture and/or tight wrap status post dilation to 4 [place] without change 2.  GERD.  Asymptomatic on PPI 3.  History of paraesophageal hernia status post repair 2009 4.  Reported history of lymphocytic colitis elsewhere 5.  Status post cholecystectomy 6.  Elevated liver tests likely secondary to fatty liver 7.  Colonoscopy October 2010 with Dr. Cristina Gong unremarkable   PLAN:  1.  I offered the patient repeat endoscopy with repeat dilation with larger dilator.  She does not wish to proceed at this time but will consider such and contact this office should she  wish to proceed 2.  Reflux precautions 3.  Continue omeprazole daily 4.  Due for colonoscopy for routine cancer screening later this year.  I advised her.  She is aware.  PLEASE PUT COLONOSCOPY RECALL IN COMPUTER for October 2020 This telehealth visit during the coronavirus pandemic was initiated by the patient and consented for by the patient who was in her home while I was in my office.  She understands there may be an associated professional charge for this service

## 2018-09-02 ENCOUNTER — Encounter (INDEPENDENT_AMBULATORY_CARE_PROVIDER_SITE_OTHER): Payer: Self-pay | Admitting: Bariatrics

## 2018-09-02 ENCOUNTER — Ambulatory Visit (INDEPENDENT_AMBULATORY_CARE_PROVIDER_SITE_OTHER): Payer: Medicare HMO | Admitting: Bariatrics

## 2018-09-02 ENCOUNTER — Other Ambulatory Visit: Payer: Self-pay

## 2018-09-02 DIAGNOSIS — E669 Obesity, unspecified: Secondary | ICD-10-CM

## 2018-09-02 DIAGNOSIS — M797 Fibromyalgia: Secondary | ICD-10-CM | POA: Diagnosis not present

## 2018-09-02 DIAGNOSIS — E7849 Other hyperlipidemia: Secondary | ICD-10-CM

## 2018-09-02 DIAGNOSIS — Z6830 Body mass index (BMI) 30.0-30.9, adult: Secondary | ICD-10-CM | POA: Diagnosis not present

## 2018-09-03 NOTE — Progress Notes (Signed)
Office: [phone]  /  Fax: [phone] TeleHealth Visit:  [name] has verbally consented to this TeleHealth visit today. The patient is located at home, the provider is located at the [employer] [facility] office. The participants in this visit include the listed provider and patient and any and all parties involved. The visit was conducted today via Skype.  HPI:   Chief Complaint: OBESITY Leslie Benson is here to discuss her progress with her obesity treatment plan. She is on the Category 2 plan and is following her eating plan approximately 70 % of the time. She states she is exercising 0 minutes 0 times per week. Ily states that she has gained 1 to 2 pounds (weight 182 lbs). She states that it may be water weight. We were unable to weigh the patient today for this TeleHealth visit. She feels as if she has gained weight since her last visit. She has lost 14 lbs since starting treatment with [name].  Hyperlipidemia Kleo has hyperlipidemia and she is taking Lovaza and Lipitor. She has been trying to improve her cholesterol levels with intensive lifestyle modification including a low saturated fat diet, exercise and weight loss. She denies myalgias.  Fibromyalgia Madline has a diagnosis of fibromyalgia. She is still having pain in her neck and arm.  ASSESSMENT AND PLAN:  Other hyperlipidemia - Plan: Comprehensive metabolic panel, Lipid Panel With LDL/HDL Ratio  Fibromyalgia  Class 1 obesity with serious comorbidity and body mass index (BMI) of 30.0 to 30.9 in adult, unspecified obesity type  PLAN:  Hyperlipidemia Krizia was informed of the American Heart Association Guidelines emphasizing intensive lifestyle modifications as the first line treatment for hyperlipidemia. We discussed many lifestyle modifications today in depth, and Ailie will continue to work on decreasing saturated fats such as fatty red meat, butter and many fried foods. She will also increase  vegetables and lean protein in her diet and continue to work on exercise and weight loss efforts. Jermiyah will continue her medications and follow up as directed.  Fibromyalgia Ngozi will continue exercise and stretching . She will continue to work on weight loss and follow up with our clinic in 2 weeks.  Obesity Cabela is currently in the action stage of change. As such, her goal is to continue with weight loss efforts She has agreed to follow the Category 2 plan Evony will exercise to video tape (exercise) for weight loss and overall health benefits. We discussed the following Behavioral Modification Strategies today: planning for success, increase H2O intake, no skipping meals, keeping healthy foods in the home, increasing lean protein intake, decreasing simple carbohydrates, increasing vegetables, decrease eating out and work on meal planning and easy cooking plans Eupha will weigh herself at home before each visit.  Zoriah has agreed to follow up with our clinic in 2 weeks. She was informed of the importance of frequent follow up visits to maximize her success with intensive lifestyle modifications for her multiple health conditions.  ALLERGIES: Allergies  Allergen Reactions  . Codeine Nausea And Vomiting  . Compazine [Prochlorperazine Edisylate]     "feels weird"  . Sulfa Antibiotics     Unknown reaction.    MEDICATIONS: Current Outpatient Medications on File Prior to Visit  Medication Sig Dispense Refill  . ALPRAZolam (XANAX) 1 MG tablet Take 2 mg by mouth 3 (three) times daily as needed for anxiety (anxiety). .    . atorvastatin (LIPITOR) 20 MG tablet TAKE 1 TABLET EVERY DAY 90 tablet 3  . Cholecalciferol (  PA VITAMIN D-3 GUMMY PO) Take 1 Dose by mouth daily.    . Cholecalciferol (VITAMIN D) 2000 units tablet Take 1 tablet (2,000 Units total) by mouth daily. 30 tablet   . gabapentin (NEURONTIN) 600 MG tablet Take 1 tablet (600 mg total) by mouth 3 (three) times daily.  (Patient taking differently: Take 1,200 mg by mouth 3 (three) times daily. Dr. Toy Care) 90 tablet 0  . levothyroxine (SYNTHROID, LEVOTHROID) 25 MCG tablet TAKE 1 TABLET ONE TIME DAILY BEFORE BREAKFAST 90 tablet 2  . metFORMIN (GLUCOPHAGE) 500 MG tablet Take 1 tablet (500 mg total) by mouth 2 (two) times daily with a meal. 60 tablet 0  . omega-3 acid ethyl esters (LOVAZA) 1 G capsule Take 1 g by mouth 2 (two) times daily.    Marland Kitchen omeprazole (PRILOSEC) 20 MG capsule Take 1 capsule (20 mg total) by mouth daily. 90 capsule 3  . QUEtiapine (SEROQUEL) 400 MG tablet Take 800 mg by mouth at bedtime.    . traMADol (ULTRAM) 50 MG tablet Take 2 tablets (100 mg total) by mouth every 6 (six) hours as needed for pain. (Patient taking differently: Take 100 mg by mouth every 6 (six) hours as needed for moderate pain. Dr. Toy Care) 30 tablet 0  . venlafaxine XR (EFFEXOR-XR) 75 MG 24 hr capsule Take 75 mg by mouth 3 (three) times daily.      Current Facility-Administered Medications on File Prior to Visit  Medication Dose Route Frequency Provider Last Rate Last Dose  . 0.9 %  sodium chloride infusion  500 mL Intravenous Once Irene Shipper, MD        PAST MEDICAL HISTORY: Past Medical History:  Diagnosis Date  . ADHD   . Anxiety   . Barrett esophagus   . Breast nodule    benign  . Cervical arthritis 09/22/2013  . Depression   . Elevated liver enzymes   . Fatty liver   . Fibromyalgia   . Fibromyalgia   . GERD (gastroesophageal reflux disease)   . H/O hiatal hernia   . Headache(784.0)    migraines  . High cholesterol   . Lymphocytic colitis   . Migraines   . Pain   . Panic attack   . Paraesophageal hiatal hernia    repaired 2009  . Spinal stenosis   . Thyroid disease   . Thyroid disease     PAST SURGICAL HISTORY: Past Surgical History:  Procedure Laterality Date  . ANKLE SURGERY    . CHOLECYSTECTOMY  1980s?  . EUS N/A 11/19/2013   Procedure: ESOPHAGEAL ENDOSCOPIC ULTRASOUND (EUS) RADIAL;  Surgeon:  Arta Silence, MD;  Location: [facility];  Service: Endoscopy;  Laterality: N/A;  . LAPAROSCOPIC NISSEN FUNDOPLICATION  [id number]/[id number]  . LAPAROSCOPIC PARAESOPHAGEAL HERNIA REPAIR  03/05/2008   Dr Johney Maine  . TUBAL LIGATION  1990s?  Marland Kitchen VAGINAL HYSTERECTOMY  03/04/2001    SOCIAL HISTORY: Social History   Tobacco Use  . Smoking status: Never Smoker  . Smokeless tobacco: Never Used  Substance Use Topics  . Alcohol use: Yes    Alcohol/week: 0.0 standard drinks    Comment: wine occassionally  . Drug use: No    FAMILY HISTORY: Family History  Problem Relation Age of Onset  . Alcohol abuse Other   . Colon cancer Other   . Elevated Lipids Other   . Heart disease Other   . Anxiety disorder Mother   . Obesity Mother   . High Cholesterol Mother   . Thyroid cancer Mother   .  Depression Mother   . High Cholesterol Father   . Heart disease Father   . Alcoholism Father   . Colon cancer Maternal Aunt   . Stomach cancer Neg Hx   . Esophageal cancer Neg Hx     ROS: Review of Systems  Constitutional: Negative for weight loss.  Musculoskeletal: Positive for neck pain. Negative for myalgias.       Positive for arm pain    PHYSICAL EXAM: Pt in no acute distress  RECENT LABS AND TESTS: BMET    Component Value Date/Time   NA 141 05/29/2018 1310   K 4.2 05/29/2018 1310   CL 100 05/29/2018 1310   CO2 23 05/29/2018 1310   GLUCOSE 84 05/29/2018 1310   GLUCOSE 96 04/05/2017 1140   BUN 15 05/29/2018 1310   CREATININE 0.93 05/29/2018 1310   CALCIUM 9.5 05/29/2018 1310   GFRNONAA 67 05/29/2018 1310   GFRAA 78 05/29/2018 1310   Lab Results  Component Value Date   HGBA1C 5.3 05/29/2018   HGBA1C 5.5 01/23/2018   HGBA1C 5.5 04/05/2017   HGBA1C 5.5 08/27/2015   HGBA1C  09/30/2009    5.4 (NOTE)                                                                       According to the ADA Clinical Practice Recommendations for 2011, when HbA1c is used as a screening test:   >=6.5%    Diagnostic of Diabetes Mellitus           (if abnormal result  is confirmed)  5.7-6.4%   Increased risk of developing Diabetes Mellitus  References:Diagnosis and Classification of Diabetes Mellitus,Diabetes IOXB,3532,99(MEQAS 1):S62-S69 and Standards of Medical Care in         Diabetes - 2011,Diabetes Care,2011,34  (Suppl 1):S11-S61.   Lab Results  Component Value Date   INSULIN 6.2 05/29/2018   INSULIN 10.4 01/23/2018   CBC    Component Value Date/Time   WBC 4.9 10/30/2017 1514   RBC 4.50 10/30/2017 1514   HGB 13.9 10/30/2017 1514   HCT 40.9 10/30/2017 1514   PLT 243.0 10/30/2017 1514   MCV 90.8 10/30/2017 1514   MCH 31.3 09/22/2015 2321   MCHC 34.0 10/30/2017 1514   RDW 14.0 10/30/2017 1514   LYMPHSABS 2.4 10/30/2017 1514   MONOABS 0.5 10/30/2017 1514   EOSABS 0.1 10/30/2017 1514   BASOSABS 0.0 10/30/2017 1514   Iron/TIBC/Ferritin/ %Sat    Component Value Date/Time   IRON 68 10/30/2017 1514   TIBC 319 10/30/2017 1514   FERRITIN 23 10/30/2017 1514   IRONPCTSAT 21 10/30/2017 1514   Lipid Panel     Component Value Date/Time   CHOL 195 02/15/2018 1042   TRIG 85.0 02/15/2018 1042   HDL 71.90 02/15/2018 1042   CHOLHDL 3 02/15/2018 1042   VLDL 17.0 02/15/2018 1042   LDLCALC 106 (H) 02/15/2018 1042   LDLDIRECT 168.0 04/05/2017 1140   Hepatic Function Panel     Component Value Date/Time   PROT 6.8 05/29/2018 1310   ALBUMIN 4.7 05/29/2018 1310   AST 21 05/29/2018 1310   ALT 21 05/29/2018 1310   ALKPHOS 115 05/29/2018 1310   BILITOT 0.3 05/29/2018 1310   BILIDIR 0.1 02/15/2018 1736  IBILI NOT CALCULATED 09/21/2013 2215      Component Value Date/Time   TSH 1.530 01/23/2018 1142   TSH 2.05 04/05/2017 1140   TSH 3.72 08/05/2015 0903     Ref. Range 05/29/2018 13:10  Vitamin D, 25-Hydroxy Latest Ref Range: 30.0 - 100.0 ng/mL 45.4    I, Doreene Nest, am acting as Location manager for [employer]. Owens Shark, DO  I have reviewed the above documentation for accuracy and  completeness, and I agree with the above. -Jearld Lesch, DO

## 2018-09-11 ENCOUNTER — Other Ambulatory Visit (INDEPENDENT_AMBULATORY_CARE_PROVIDER_SITE_OTHER): Payer: Self-pay | Admitting: Bariatrics

## 2018-09-11 DIAGNOSIS — E8881 Metabolic syndrome: Secondary | ICD-10-CM

## 2018-09-14 ENCOUNTER — Other Ambulatory Visit (INDEPENDENT_AMBULATORY_CARE_PROVIDER_SITE_OTHER): Payer: Self-pay | Admitting: Bariatrics

## 2018-09-14 DIAGNOSIS — E8881 Metabolic syndrome: Secondary | ICD-10-CM

## 2018-09-17 ENCOUNTER — Encounter (INDEPENDENT_AMBULATORY_CARE_PROVIDER_SITE_OTHER): Payer: Self-pay | Admitting: Bariatrics

## 2018-09-17 ENCOUNTER — Ambulatory Visit (INDEPENDENT_AMBULATORY_CARE_PROVIDER_SITE_OTHER): Payer: Medicare HMO | Admitting: Bariatrics

## 2018-09-17 ENCOUNTER — Other Ambulatory Visit: Payer: Self-pay

## 2018-09-17 DIAGNOSIS — Z6830 Body mass index (BMI) 30.0-30.9, adult: Secondary | ICD-10-CM | POA: Diagnosis not present

## 2018-09-17 DIAGNOSIS — E7849 Other hyperlipidemia: Secondary | ICD-10-CM | POA: Diagnosis not present

## 2018-09-17 DIAGNOSIS — E038 Other specified hypothyroidism: Secondary | ICD-10-CM | POA: Diagnosis not present

## 2018-09-17 DIAGNOSIS — E669 Obesity, unspecified: Secondary | ICD-10-CM | POA: Diagnosis not present

## 2018-09-18 NOTE — Progress Notes (Signed)
Office: [phone]  /  Fax: [phone] TeleHealth Visit:  Leslie Benson has verbally consented to this TeleHealth visit today. The patient is located at home, the provider is located at the [employer] [facility] office. The participants in this visit include the listed provider and patient and any and all parties involved. The visit was conducted today via Skype.  HPI:   Chief Complaint: OBESITY Leslie Benson is here to discuss her progress with her obesity treatment plan. She is on the Category 2 plan and is following her eating plan approximately 80 % of the time. She states she is walking 30 minutes 7 times per week. Leslie Benson states that she has lost 2 pounds (weight 178 lbs). She has done well overall. She is not struggling with anything. We were unable to weigh the patient today for this TeleHealth visit. She feels as if she has lost weight since her last visit. She has lost 18 lbs since starting treatment with [name].  Hypothyroidism Leslie Benson has a diagnosis of hypothyroidism. Leslie Benson is taking Synthroid and she is well controlled. She denies fatigue.   Hyperlipidemia Leslie Benson has hyperlipidemia and she is taking Lovaza. She has been trying to improve her cholesterol levels with intensive lifestyle modification including a low saturated fat diet, exercise and weight loss. She denies myalgias.  ASSESSMENT AND PLAN:  Other specified hypothyroidism  Other hyperlipidemia  Class 1 obesity with serious comorbidity and body mass index (BMI) of 30.0 to 30.9 in adult, unspecified obesity type  PLAN:  Hypothyroidism Leslie Benson was informed of the importance of good thyroid control to help with weight loss efforts. She was also informed that supertheraputic thyroid levels are dangerous and will not improve weight loss results. Leslie Benson will continue her medications and she will check labs at [facility].  Hyperlipidemia Leslie Benson was informed of the American Heart Association Guidelines  emphasizing intensive lifestyle modifications as the first line treatment for hyperlipidemia. We discussed many lifestyle modifications today in depth, and Leslie Benson will continue to work on decreasing saturated fats such as fatty red meat, butter and many fried foods. She will also increase vegetables and lean protein in her diet and continue to work on exercise and weight loss efforts. Leslie Benson will continue her medications and she will check labs at [facility].  Obesity Leslie Benson is currently in the action stage of change. As such, her goal is to continue with weight loss efforts She has agreed to follow the Category 2 plan Leslie Benson will continue her exercise regimen for weight loss and overall health benefits. We discussed the following Behavioral Modification Strategies today: planning for success, increase H2O intake, no skipping meals, keeping healthy foods in the home, increasing lean protein intake, decreasing simple carbohydrates, increasing vegetables, decrease eating out, work on meal planning and easy cooking plans, ways to avoid boredom eating and decrease liquid calories Leslie Benson will weigh herself at home until she returns to the office. Leslie Benson will read nutrition labels and she will continue to weigh meat.  Leslie Benson has agreed to follow up with our clinic in 2 weeks. She was informed of the importance of frequent follow up visits to maximize her success with intensive lifestyle modifications for her multiple health conditions.  ALLERGIES: Allergies  Allergen Reactions  . Codeine Nausea And Vomiting  . Compazine [Prochlorperazine Edisylate]     "feels weird"  . Sulfa Antibiotics     Unknown reaction.    MEDICATIONS: Current Outpatient Medications on File Prior to Visit  Medication Sig Dispense Refill  . ALPRAZolam (  XANAX) 1 MG tablet Take 2 mg by mouth 3 (three) times daily as needed for anxiety (anxiety). .    . atorvastatin (LIPITOR) 20 MG tablet TAKE 1 TABLET EVERY DAY 90 tablet 3   . Cholecalciferol (PA VITAMIN D-3 GUMMY PO) Take 1 Dose by mouth daily.    . Cholecalciferol (VITAMIN D) 2000 units tablet Take 1 tablet (2,000 Units total) by mouth daily. 30 tablet   . gabapentin (NEURONTIN) 600 MG tablet Take 1 tablet (600 mg total) by mouth 3 (three) times daily. (Patient taking differently: Take 1,200 mg by mouth 3 (three) times daily. Dr. Toy Care) 90 tablet 0  . levothyroxine (SYNTHROID, LEVOTHROID) 25 MCG tablet TAKE 1 TABLET ONE TIME DAILY BEFORE BREAKFAST 90 tablet 2  . metFORMIN (GLUCOPHAGE) 500 MG tablet Take 1 tablet (500 mg total) by mouth 2 (two) times daily with a meal. 60 tablet 0  . omega-3 acid ethyl esters (LOVAZA) 1 G capsule Take 1 g by mouth 2 (two) times daily.    Marland Kitchen omeprazole (PRILOSEC) 20 MG capsule Take 1 capsule (20 mg total) by mouth daily. 90 capsule 3  . QUEtiapine (SEROQUEL) 400 MG tablet Take 800 mg by mouth at bedtime.    . traMADol (ULTRAM) 50 MG tablet Take 2 tablets (100 mg total) by mouth every 6 (six) hours as needed for pain. (Patient taking differently: Take 100 mg by mouth every 6 (six) hours as needed for moderate pain. Dr. Toy Care) 30 tablet 0  . venlafaxine XR (EFFEXOR-XR) 75 MG 24 hr capsule Take 75 mg by mouth 3 (three) times daily.      Current Facility-Administered Medications on File Prior to Visit  Medication Dose Route Frequency Provider Last Rate Last Dose  . 0.9 %  sodium chloride infusion  500 mL Intravenous Once Irene Shipper, MD        PAST MEDICAL HISTORY: Past Medical History:  Diagnosis Date  . ADHD   . Anxiety   . Barrett esophagus   . Breast nodule    benign  . Cervical arthritis 09/22/2013  . Depression   . Elevated liver enzymes   . Fatty liver   . Fibromyalgia   . Fibromyalgia   . GERD (gastroesophageal reflux disease)   . H/O hiatal hernia   . Headache(784.0)    migraines  . High cholesterol   . Lymphocytic colitis   . Migraines   . Pain   . Panic attack   . Paraesophageal hiatal hernia    repaired 2009   . Spinal stenosis   . Thyroid disease   . Thyroid disease     PAST SURGICAL HISTORY: Past Surgical History:  Procedure Laterality Date  . ANKLE SURGERY    . CHOLECYSTECTOMY  1980s?  . EUS N/A 11/19/2013   Procedure: ESOPHAGEAL ENDOSCOPIC ULTRASOUND (EUS) RADIAL;  Surgeon: Arta Silence, MD;  Location: [facility];  Service: Endoscopy;  Laterality: N/A;  . LAPAROSCOPIC NISSEN FUNDOPLICATION  99/83/3825  . LAPAROSCOPIC PARAESOPHAGEAL HERNIA REPAIR  03/05/2008   Dr Johney Maine  . TUBAL LIGATION  1990s?  Marland Kitchen VAGINAL HYSTERECTOMY  03/04/2001    SOCIAL HISTORY: Social History   Tobacco Use  . Smoking status: Never Smoker  . Smokeless tobacco: Never Used  Substance Use Topics  . Alcohol use: Yes    Alcohol/week: 0.0 standard drinks    Comment: wine occassionally  . Drug use: No    FAMILY HISTORY: Family History  Problem Relation Age of Onset  . Alcohol abuse Other   .  Colon cancer Other   . Elevated Lipids Other   . Heart disease Other   . Anxiety disorder Mother   . Obesity Mother   . High Cholesterol Mother   . Thyroid cancer Mother   . Depression Mother   . High Cholesterol Father   . Heart disease Father   . Alcoholism Father   . Colon cancer Maternal Aunt   . Stomach cancer Neg Hx   . Esophageal cancer Neg Hx     ROS: Review of Systems  Constitutional: Positive for weight loss. Negative for malaise/fatigue.  Musculoskeletal: Negative for myalgias.    PHYSICAL EXAM: Pt in no acute distress  RECENT LABS AND TESTS: BMET    Component Value Date/Time   NA 141 05/29/2018 1310   K 4.2 05/29/2018 1310   CL 100 05/29/2018 1310   CO2 23 05/29/2018 1310   GLUCOSE 84 05/29/2018 1310   GLUCOSE 96 04/05/2017 1140   BUN 15 05/29/2018 1310   CREATININE 0.93 05/29/2018 1310   CALCIUM 9.5 05/29/2018 1310   GFRNONAA 67 05/29/2018 1310   GFRAA 78 05/29/2018 1310   Lab Results  Component Value Date   HGBA1C 5.3 05/29/2018   HGBA1C 5.5 01/23/2018   HGBA1C 5.5  04/05/2017   HGBA1C 5.5 08/27/2015   HGBA1C  09/30/2009    5.4 (NOTE)                                                                       According to the ADA Clinical Practice Recommendations for 2011, when HbA1c is used as a screening test:   >=6.5%   Diagnostic of Diabetes Mellitus           (if abnormal result  is confirmed)  5.7-6.4%   Increased risk of developing Diabetes Mellitus  References:Diagnosis and Classification of Diabetes Mellitus,Diabetes TSVX,7939,03(ESPQZ 1):S62-S69 and Standards of Medical Care in         Diabetes - 2011,Diabetes Care,2011,34  (Suppl 1):S11-S61.   Lab Results  Component Value Date   INSULIN 6.2 05/29/2018   INSULIN 10.4 01/23/2018   CBC    Component Value Date/Time   WBC 4.9 10/30/2017 1514   RBC 4.50 10/30/2017 1514   HGB 13.9 10/30/2017 1514   HCT 40.9 10/30/2017 1514   PLT 243.0 10/30/2017 1514   MCV 90.8 10/30/2017 1514   MCH 31.3 09/22/2015 2321   MCHC 34.0 10/30/2017 1514   RDW 14.0 10/30/2017 1514   LYMPHSABS 2.4 10/30/2017 1514   MONOABS 0.5 10/30/2017 1514   EOSABS 0.1 10/30/2017 1514   BASOSABS 0.0 10/30/2017 1514   Iron/TIBC/Ferritin/ %Sat    Component Value Date/Time   IRON 68 10/30/2017 1514   TIBC 319 10/30/2017 1514   FERRITIN 23 10/30/2017 1514   IRONPCTSAT 21 10/30/2017 1514   Lipid Panel     Component Value Date/Time   CHOL 195 02/15/2018 1042   TRIG 85.0 02/15/2018 1042   HDL 71.90 02/15/2018 1042   CHOLHDL 3 02/15/2018 1042   VLDL 17.0 02/15/2018 1042   LDLCALC 106 (H) 02/15/2018 1042   LDLDIRECT 168.0 04/05/2017 1140   Hepatic Function Panel     Component Value Date/Time   PROT 6.8 05/29/2018 1310   ALBUMIN 4.7 05/29/2018 1310  AST 21 05/29/2018 1310   ALT 21 05/29/2018 1310   ALKPHOS 115 05/29/2018 1310   BILITOT 0.3 05/29/2018 1310   BILIDIR 0.1 02/15/2018 1736   IBILI NOT CALCULATED 09/21/2013 2215      Component Value Date/Time   TSH 1.530 01/23/2018 1142   TSH 2.05 04/05/2017 1140   TSH  3.72 08/05/2015 0903     Ref. Range 05/29/2018 13:10  Vitamin D, 25-Hydroxy Latest Ref Range: 30.0 - 100.0 ng/mL 45.4    I, Doreene Nest, am acting as Location manager for [employer]. Owens Shark, DO  I have reviewed the above documentation for accuracy and completeness, and I agree with the above. -Jearld Lesch, DO

## 2018-09-19 ENCOUNTER — Other Ambulatory Visit (INDEPENDENT_AMBULATORY_CARE_PROVIDER_SITE_OTHER): Payer: Self-pay | Admitting: Bariatrics

## 2018-09-19 DIAGNOSIS — E8881 Metabolic syndrome: Secondary | ICD-10-CM

## 2018-09-25 DIAGNOSIS — E7849 Other hyperlipidemia: Secondary | ICD-10-CM | POA: Diagnosis not present

## 2018-09-26 LAB — COMPREHENSIVE METABOLIC PANEL
ALT: 23 IU/L (ref 0–32)
AST: 20 IU/L (ref 0–40)
Albumin/Globulin Ratio: 2 (ref 1.2–2.2)
Albumin: 4.8 g/dL (ref 3.8–4.9)
Alkaline Phosphatase: 107 IU/L (ref 39–117)
BUN/Creatinine Ratio: 19 (ref 9–23)
BUN: 20 mg/dL (ref 6–24)
Bilirubin Total: 0.3 mg/dL (ref 0.0–1.2)
CO2: 25 mmol/L (ref 20–29)
Calcium: 9.9 mg/dL (ref 8.7–10.2)
Chloride: 99 mmol/L (ref 96–106)
Creatinine, Ser: 1.08 mg/dL — ABNORMAL HIGH (ref 0.57–1.00)
GFR calc Af Amer: 65 mL/min/{1.73_m2} (ref >59)
GFR calc non Af Amer: 56 mL/min/{1.73_m2} — ABNORMAL LOW (ref >59)
Globulin, Total: 2.4 g/dL (ref 1.5–4.5)
Glucose: 97 mg/dL (ref 65–99)
Potassium: 4.3 mmol/L (ref 3.5–5.2)
Sodium: 141 mmol/L (ref 134–144)
Total Protein: 7.2 g/dL (ref 6.0–8.5)

## 2018-09-26 LAB — LIPID PANEL WITH LDL/HDL RATIO
Cholesterol, Total: 165 mg/dL (ref 100–199)
HDL: 87 mg/dL (ref >39)
LDL Calculated: 62 mg/dL (ref 0–99)
LDl/HDL Ratio: 0.7 ratio (ref 0.0–3.2)
Triglycerides: 78 mg/dL (ref 0–149)
VLDL Cholesterol Cal: 16 mg/dL (ref 5–40)

## 2018-10-03 ENCOUNTER — Ambulatory Visit (INDEPENDENT_AMBULATORY_CARE_PROVIDER_SITE_OTHER): Payer: Medicare HMO | Admitting: Family Medicine

## 2018-10-08 ENCOUNTER — Other Ambulatory Visit (INDEPENDENT_AMBULATORY_CARE_PROVIDER_SITE_OTHER): Payer: Self-pay | Admitting: Bariatrics

## 2018-10-08 DIAGNOSIS — E8881 Metabolic syndrome: Secondary | ICD-10-CM

## 2018-10-21 ENCOUNTER — Ambulatory Visit (INDEPENDENT_AMBULATORY_CARE_PROVIDER_SITE_OTHER): Payer: Self-pay | Admitting: Bariatrics

## 2018-11-14 ENCOUNTER — Ambulatory Visit (INDEPENDENT_AMBULATORY_CARE_PROVIDER_SITE_OTHER): Payer: Medicare HMO | Admitting: Adult Health

## 2018-11-14 ENCOUNTER — Encounter: Payer: Self-pay | Admitting: Adult Health

## 2018-11-14 VITALS — BP 108/82 | Temp 98.4°F | Wt 180.0 lb

## 2018-11-14 DIAGNOSIS — B372 Candidiasis of skin and nail: Secondary | ICD-10-CM

## 2018-11-14 NOTE — Patient Instructions (Signed)
You have a fungal infection. Please pick up Lotrin, this is over the counter. Place a thin layer on it twice a day

## 2018-11-14 NOTE — Progress Notes (Signed)
Subjective:    Patient ID: Leslie Benson, female    DOB: 1960/07/12, 58 y.o.   MRN: [id number]  HPI 58 year old female who  has a past medical history of ADHD, Anxiety, Barrett esophagus, Breast nodule, Cervical arthritis (09/22/2013), Depression, Elevated liver enzymes, Fatty liver, Fibromyalgia, Fibromyalgia, GERD (gastroesophageal reflux disease), H/O hiatal hernia, Headache(784.0), High cholesterol, Lymphocytic colitis, Migraines, Pain, Panic attack, Paraesophageal hiatal hernia, Spinal stenosis, Thyroid disease, and Thyroid disease.  She presents to the office today for an acute issue of a "burning rash".  First noticed about a week ago and is located on her abdomen.  Rash extends over her C-section scar from 35 years ago.  She denies itching or drainage.  Has not noticed a foul odor.  The rash is under an abdominal skin overhanging  Review of Systems See HPI   Past Medical History:  Diagnosis Date  . ADHD   . Anxiety   . Barrett esophagus   . Breast nodule    benign  . Cervical arthritis 09/22/2013  . Depression   . Elevated liver enzymes   . Fatty liver   . Fibromyalgia   . Fibromyalgia   . GERD (gastroesophageal reflux disease)   . H/O hiatal hernia   . Headache(784.0)    migraines  . High cholesterol   . Lymphocytic colitis   . Migraines   . Pain   . Panic attack   . Paraesophageal hiatal hernia    repaired 2009  . Spinal stenosis   . Thyroid disease   . Thyroid disease     Social History   Socioeconomic History  . Marital status: Married    Spouse name: Abe People  . Number of children: Not on file  . Years of education: Not on file  . Highest education level: Not on file  Occupational History  . Occupation: disabled  Social Needs  . Financial resource strain: Not on file  . Food insecurity    Worry: Not on file    Inability: Not on file  . Transportation needs    Medical: Not on file    Non-medical: Not on file  Tobacco Use  . Smoking status: Never  Smoker  . Smokeless tobacco: Never Used  Substance and Sexual Activity  . Alcohol use: Yes    Alcohol/week: 0.0 standard drinks    Comment: wine occassionally  . Drug use: No  . Sexual activity: Yes    Birth control/protection: None    Comment: n/a partial hysterectomy  Lifestyle  . Physical activity    Days per week: Not on file    Minutes per session: Not on file  . Stress: Not on file  Relationships  . Social Herbalist on phone: Not on file    Gets together: Not on file    Attends religious service: Not on file    Active member of club or organization: Not on file    Attends meetings of clubs or organizations: Not on file    Relationship status: Not on file  . Intimate partner violence    Fear of current or ex partner: Not on file    Emotionally abused: Not on file    Physically abused: Not on file    Forced sexual activity: Not on file  Other Topics Concern  . Not on file  Social History Narrative   Married    Lived in [place].  Moved to [place] in early 2000s   She is  on disability for anxiety     Past Surgical History:  Procedure Laterality Date  . ANKLE SURGERY    . CHOLECYSTECTOMY  1980s?  . EUS N/A 11/19/2013   Procedure: ESOPHAGEAL ENDOSCOPIC ULTRASOUND (EUS) RADIAL;  Surgeon: Arta Silence, MD;  Location: [facility];  Service: Endoscopy;  Laterality: N/A;  . LAPAROSCOPIC NISSEN FUNDOPLICATION  96/[id number]/9381  . LAPAROSCOPIC PARAESOPHAGEAL HERNIA REPAIR  03/05/2008   Dr Johney Maine  . TUBAL LIGATION  1990s?  Marland Kitchen VAGINAL HYSTERECTOMY  03/04/2001    Family History  Problem Relation Age of Onset  . Alcohol abuse Other   . Colon cancer Other   . Elevated Lipids Other   . Heart disease Other   . Anxiety disorder Mother   . Obesity Mother   . High Cholesterol Mother   . Thyroid cancer Mother   . Depression Mother   . High Cholesterol Father   . Heart disease Father   . Alcoholism Father   . Colon cancer Maternal Aunt   . Stomach cancer Neg Hx   .  Esophageal cancer Neg Hx     Allergies  Allergen Reactions  . Codeine Nausea And Vomiting  . Compazine [Prochlorperazine Edisylate]     "feels weird"  . Sulfa Antibiotics     Unknown reaction.    Current Outpatient Medications on File Prior to Visit  Medication Sig Dispense Refill  . ALPRAZolam (XANAX) 1 MG tablet Take 2 mg by mouth 3 (three) times daily as needed for anxiety (anxiety). .    . atorvastatin (LIPITOR) 20 MG tablet TAKE 1 TABLET EVERY DAY 90 tablet 3  . Cholecalciferol (PA VITAMIN D-3 GUMMY PO) Take 1 Dose by mouth daily.    . Cholecalciferol (VITAMIN D) 2000 units tablet Take 1 tablet (2,000 Units total) by mouth daily. 30 tablet   . gabapentin (NEURONTIN) 600 MG tablet Take 1 tablet (600 mg total) by mouth 3 (three) times daily. (Patient taking differently: Take 1,200 mg by mouth 3 (three) times daily. Dr. Toy Care) 90 tablet 0  . levothyroxine (SYNTHROID, LEVOTHROID) 25 MCG tablet TAKE 1 TABLET ONE TIME DAILY BEFORE BREAKFAST 90 tablet 2  . metFORMIN (GLUCOPHAGE) 500 MG tablet Take 1 tablet (500 mg total) by mouth 2 (two) times daily with a meal. 60 tablet 0  . omega-3 acid ethyl esters (LOVAZA) 1 G capsule Take 1 g by mouth 2 (two) times daily.    Marland Kitchen omeprazole (PRILOSEC) 20 MG capsule Take 1 capsule (20 mg total) by mouth daily. 90 capsule 3  . QUEtiapine (SEROQUEL) 400 MG tablet Take 800 mg by mouth at bedtime.    . traMADol (ULTRAM) 50 MG tablet Take 2 tablets (100 mg total) by mouth every 6 (six) hours as needed for pain. (Patient taking differently: Take 100 mg by mouth every 6 (six) hours as needed for moderate pain. Dr. Toy Care) 30 tablet 0  . venlafaxine XR (EFFEXOR-XR) 75 MG 24 hr capsule Take 75 mg by mouth 3 (three) times daily.      No current facility-administered medications on file prior to visit.     BP 108/82   Temp 98.4 F (36.9 C)   Wt 180 lb (81.6 kg)   BMI 30.90 kg/m       Objective:   Physical Exam Vitals signs and nursing note reviewed.   Constitutional:      Appearance: Normal appearance.  Skin:    General: Skin is warm.     Findings: Erythema and rash present.  Comments: Red rash with skin breakdown under skin fold on abdomen.  No drainage or discharge noted  Neurological:     General: No focal deficit present.     Mental Status: She is alert and oriented to person, place, and time.  Psychiatric:        Mood and Affect: Mood normal.        Behavior: Behavior normal.        Thought Content: Thought content normal.        Judgment: Judgment normal.       Assessment & Plan:  1. Candidal dermatitis -Exam consistent with fungal infection.  Advised over-the-counter Lotrimin, apply a thin layer twice a day.  Advised follow-up if not starting to resolve within 1 to 2 weeks.  She was also advised to keep area clean and dry.  [name], NP

## 2018-12-23 ENCOUNTER — Other Ambulatory Visit: Payer: Self-pay | Admitting: Adult Health

## 2019-01-20 ENCOUNTER — Emergency Department (HOSPITAL_COMMUNITY)
Admission: EM | Admit: 2019-01-20 | Discharge: 2019-01-20 | Disposition: A | Discharge status: Home / Self Care | Payer: Medicare HMO | Attending: Emergency Medicine | Admitting: Emergency Medicine

## 2019-01-20 ENCOUNTER — Other Ambulatory Visit: Payer: Self-pay

## 2019-01-20 ENCOUNTER — Encounter (HOSPITAL_COMMUNITY): Payer: Self-pay

## 2019-01-20 DIAGNOSIS — R109 Unspecified abdominal pain: Secondary | ICD-10-CM | POA: Diagnosis present

## 2019-01-20 DIAGNOSIS — E039 Hypothyroidism, unspecified: Secondary | ICD-10-CM | POA: Diagnosis not present

## 2019-01-20 DIAGNOSIS — R300 Dysuria: Secondary | ICD-10-CM | POA: Insufficient documentation

## 2019-01-20 DIAGNOSIS — Z79899 Other long term (current) drug therapy: Secondary | ICD-10-CM | POA: Diagnosis not present

## 2019-01-20 DIAGNOSIS — Z7984 Long term (current) use of oral hypoglycemic drugs: Secondary | ICD-10-CM | POA: Insufficient documentation

## 2019-01-20 DIAGNOSIS — N390 Urinary tract infection, site not specified: Secondary | ICD-10-CM | POA: Insufficient documentation

## 2019-01-20 LAB — URINALYSIS, ROUTINE W REFLEX MICROSCOPIC
Bilirubin Urine: NEGATIVE
Glucose, UA: NEGATIVE mg/dL
Hgb urine dipstick: NEGATIVE
Ketones, ur: NEGATIVE mg/dL
Nitrite: NEGATIVE
Protein, ur: NEGATIVE mg/dL
Specific Gravity, Urine: 1.017 (ref 1.005–1.030)
WBC, UA: >50 WBC/hpf — ABNORMAL HIGH (ref 0–5)
pH: 5 (ref 5.0–8.0)

## 2019-01-20 MED ORDER — CEPHALEXIN 500 MG PO CAPS
500.0000 mg | ORAL_CAPSULE | Freq: Once | ORAL | Status: AC
  Administered 2019-01-20: 500 mg via ORAL
  Filled 2019-01-20: qty 1

## 2019-01-20 MED ORDER — CEPHALEXIN 500 MG PO CAPS: 500.0000 mg | ORAL_CAPSULE | Freq: Four times a day (QID) | ORAL | 0 refills | Status: DC

## 2019-01-20 NOTE — ED Provider Notes (Signed)
[facility] DEPT Provider Note   CSN: [id number] Arrival date & time: 01/20/19  1413     History   Chief Complaint Chief Complaint  Patient presents with  . Flank Pain  . Dysuria    HPI Leslie Benson is a 58 y.o. female.     HPI   She presents for evaluation of right flank pain with dysuria for 1 day.  She denies fever, chills, nausea or vomiting.  She had a somewhat decreased appetite today.  No recent urinary tract infections.  She denies dizziness or weakness.  She was able to drive here to be evaluated.  There are no other known modifying factors.  Past Medical History:  Diagnosis Date  . ADHD   . Anxiety   . Barrett esophagus   . Breast nodule    benign  . Cervical arthritis 09/22/2013  . Depression   . Elevated liver enzymes   . Fatty liver   . Fibromyalgia   . Fibromyalgia   . GERD (gastroesophageal reflux disease)   . H/O hiatal hernia   . Headache(784.0)    migraines  . High cholesterol   . Lymphocytic colitis   . Migraines   . Pain   . Panic attack   . Paraesophageal hiatal hernia    repaired 2009  . Spinal stenosis   . Thyroid disease   . Thyroid disease     Patient Active Problem List   Diagnosis Date Noted  . Hyperlipidemia associated with type 2 diabetes mellitus ([place]) 07/03/2018  . Other hyperlipidemia 01/23/2018  . Class 1 obesity with serious comorbidity and body mass index (BMI) of 30.0 to 30.9 in adult 01/23/2018  . Hypothyroidism 11/25/2014  . Adynamic ileus ([place]) 09/22/2013  . Obesity (BMI 30-39.9) 09/22/2013  . Abdominal pain, epigastric 09/22/2013  . Transaminitis 09/22/2013  . Cervical arthritis 09/22/2013  . Chronic constipation 09/22/2013  . Fibromyalgia     Past Surgical History:  Procedure Laterality Date  . ANKLE SURGERY    . CHOLECYSTECTOMY  1980s?  . EUS N/A 11/19/2013   Procedure: ESOPHAGEAL ENDOSCOPIC ULTRASOUND (EUS) RADIAL;  Surgeon: Arta Silence, MD;  Location: [facility];   Service: Endoscopy;  Laterality: N/A;  . LAPAROSCOPIC NISSEN FUNDOPLICATION  0000000  . LAPAROSCOPIC PARAESOPHAGEAL HERNIA REPAIR  03/05/2008   Dr Johney Maine  . TUBAL LIGATION  1990s?  Marland Kitchen VAGINAL HYSTERECTOMY  03/04/2001     OB History    Gravida  4   Para  4   Term      Preterm      AB      Living        SAB      TAB      Ectopic      Multiple      Live Births               Home Medications    Prior to Admission medications   Medication Sig Start Date End Date Taking? Authorizing Provider  ALPRAZolam Duanne Moron) 1 MG tablet Take 2 mg by mouth 3 (three) times daily as needed for anxiety (anxiety). . 06/20/12   Chucky May, MD  atorvastatin (LIPITOR) 20 MG tablet TAKE 1 TABLET EVERY DAY 05/30/18   Nafziger, Tommi Rumps, NP  cephALEXin (KEFLEX) 500 MG capsule Take 1 capsule (500 mg total) by mouth 4 (four) times daily. 01/20/19   Daleen Bo, MD  Cholecalciferol (PA VITAMIN D-3 GUMMY PO) Take 1 Dose by mouth daily. 02/16/18  [provider]  Cholecalciferol (VITAMIN D) 2000 units tablet Take 1 tablet (2,000 Units total) by mouth daily. 02/14/18   Jearld Lesch A, DO  gabapentin (NEURONTIN) 600 MG tablet Take 1 tablet (600 mg total) by mouth 3 (three) times daily. Patient taking differently: Take 1,200 mg by mouth 3 (three) times daily. Dr. Toy Care 06/20/12   Virgel Manifold, MD  levothyroxine (SYNTHROID) 25 MCG tablet Take 1 tablet (25 mcg total) by mouth daily before breakfast. Needs physical for further refills 12/25/18   [name], NP  metFORMIN (GLUCOPHAGE) 500 MG tablet Take 1 tablet (500 mg total) by mouth 2 (two) times daily with a meal. 08/19/18   Jearld Lesch A, DO  omega-3 acid ethyl esters (LOVAZA) 1 G capsule Take 1 g by mouth 2 (two) times daily.    [provider]  omeprazole (PRILOSEC) 20 MG capsule Take 1 capsule (20 mg total) by mouth daily. 06/18/18   Irene Shipper, MD  QUEtiapine (SEROQUEL) 400 MG tablet Take 800 mg by mouth at bedtime.     [provider]  traMADol (ULTRAM) 50 MG tablet Take 2 tablets (100 mg total) by mouth every 6 (six) hours as needed for pain. Patient taking differently: Take 100 mg by mouth every 6 (six) hours as needed for moderate pain. Dr. Toy Care 06/20/12   Virgel Manifold, MD  venlafaxine XR (EFFEXOR-XR) 75 MG 24 hr capsule Take 75 mg by mouth 3 (three) times daily.     Chucky May, MD    Family History Family History  Problem Relation Age of Onset  . Alcohol abuse Other   . Colon cancer Other   . Elevated Lipids Other   . Heart disease Other   . Anxiety disorder Mother   . Obesity Mother   . High Cholesterol Mother   . Thyroid cancer Mother   . Depression Mother   . High Cholesterol Father   . Heart disease Father   . Alcoholism Father   . Colon cancer Maternal Aunt   . Stomach cancer Neg Hx   . Esophageal cancer Neg Hx     Social History Social History   Tobacco Use  . Smoking status: Never Smoker  . Smokeless tobacco: Never Used  Substance Use Topics  . Alcohol use: Not Currently    Alcohol/week: 0.0 standard drinks    Comment: wine occassionally  . Drug use: No     Allergies   Codeine, Compazine [prochlorperazine edisylate], and Sulfa antibiotics   Review of Systems Review of Systems  All other systems reviewed and are negative.    Physical Exam Updated Vital Signs BP 119/85 (BP Location: Left Arm)   Pulse 77   Temp 98.4 F (36.9 C) (Oral)   Resp 16   Ht 5\' 4"  (1.626 m)   Wt 81.6 kg   SpO2 99%   BMI 30.90 kg/m   Physical Exam Vitals signs and nursing note reviewed.  Constitutional:      General: She is not in acute distress.    Appearance: She is well-developed. She is obese. She is not ill-appearing or diaphoretic.  HENT:     Head: Normocephalic and atraumatic.     Right Ear: External ear normal.     Left Ear: External ear normal.  Eyes:     Conjunctiva/sclera: Conjunctivae normal.     Pupils: Pupils are equal, round, and reactive to light.   Neck:     Musculoskeletal: Normal range of motion and neck supple.  Trachea: Phonation normal.  Cardiovascular:     Rate and Rhythm: Normal rate and regular rhythm.     Heart sounds: Normal heart sounds.  Pulmonary:     Effort: Pulmonary effort is normal.     Breath sounds: Normal breath sounds.  Abdominal:     General: There is no distension.     Palpations: Abdomen is soft. There is no mass.     Tenderness: There is no abdominal tenderness.  Genitourinary:    Comments: Tender to palpation of the right flank. Musculoskeletal: Normal range of motion.  Skin:    General: Skin is warm and dry.  Neurological:     Mental Status: She is alert and oriented to person, place, and time.     Cranial Nerves: No cranial nerve deficit.     Sensory: No sensory deficit.     Motor: No abnormal muscle tone.     Coordination: Coordination normal.  Psychiatric:        Mood and Affect: Mood normal.        Behavior: Behavior normal.        Thought Content: Thought content normal.        Judgment: Judgment normal.      ED Treatments / Results  Labs (all labs ordered are listed, but only abnormal results are displayed) Labs Reviewed  URINALYSIS, ROUTINE W REFLEX MICROSCOPIC - Abnormal; Notable for the following components:      Result Value   APPearance CLOUDY (*)    Leukocytes,Ua LARGE (*)    WBC, UA >50 (*)    Bacteria, UA MANY (*)    All other components within normal limits  URINE CULTURE    EKG None  Radiology No results found.  Procedures Procedures (including critical care time)  Medications Ordered in ED Medications  cephALEXin (KEFLEX) capsule 500 mg (has no administration in time range)     Initial Impression / Assessment and Plan / ED Course  I have reviewed the triage vital signs and the nursing notes.  Pertinent labs & imaging results that were available during my care of the patient were reviewed by me and considered in my medical decision making (see chart  for details).         Patient Vitals for the past 24 hrs:  BP Temp Temp src Pulse Resp SpO2 Height Weight  01/20/19 2000 119/85 - - 77 16 99 % - -  01/20/19 1449 - - - - - - 5\' 4"  (1.626 m) 81.6 kg  01/20/19 1448 107/69 - - - - - - -  01/20/19 1445 - 98.4 F (36.9 C) Oral (!) 102 18 99 % - -    8:20 PM Reevaluation with update and discussion. After initial assessment and treatment, an updated evaluation reveals no change in clinical status, findings discussed with patient all questions were answered. Daleen Bo   Medical Decision Making: Evaluation consistent with UTI, possibly early pyelonephritis.  Doubt systemic infection, metabolic instability or impending vascular collapse.  CRITICAL CARE-no Performed by: Daleen Bo  Nursing Notes Reviewed/ Care Coordinated Applicable Imaging Reviewed Interpretation of Laboratory Data incorporated into ED treatment  The patient appears reasonably screened and/or stabilized for discharge and I doubt any other medical condition or other [facility] requiring further screening, evaluation, or treatment in the ED at this time prior to discharge.  Plan: Home Medications-continue usual, use Tylenol for pain or fever; Home Treatments-push oral fluids; return here if the recommended treatment, does not improve the symptoms; Recommended follow  up-PCP follow-up in 1 week for repeat urinalysis.   Final Clinical Impressions(s) / ED Diagnoses   Final diagnoses:  Lower urinary tract infectious disease    ED Discharge Orders         Ordered    cephALEXin (KEFLEX) 500 MG capsule  4 times daily     01/20/19 2023           Daleen Bo, MD 01/20/19 2023

## 2019-01-20 NOTE — Discharge Instructions (Signed)
The testing today indicates that you have a urinary tract infection causing her discomfort.  For pain, take Tylenol 650 mg every 4 hours.  We sent a prescription antibiotic to your pharmacy to start taking tomorrow morning.  Return here, if needed, for problems.

## 2019-01-20 NOTE — ED Notes (Signed)
Lab called to run urine culture.

## 2019-01-20 NOTE — ED Triage Notes (Signed)
Patient c/o right flank pain since last night and dysuria.

## 2019-01-20 NOTE — ED Notes (Signed)
Pt verbalized discharge instructions and follow up care. Alert and ambulatory. No iv.  

## 2019-01-23 LAB — URINE CULTURE
Culture: >=100000 — AB
Special Requests: NORMAL

## 2019-01-24 ENCOUNTER — Telehealth: Payer: Self-pay

## 2019-01-24 NOTE — Telephone Encounter (Signed)
Post ED Visit - Positive Culture Follow-up  Culture report reviewed by antimicrobial stewardship pharmacist: [place] Team []  Nathan Batchelder, Pharm.D. []  [address], Pharm.D., BCPS AQ-ID []  Heide Guile, Pharm.D., BCPS []  Parks Neptune, Pharm.D., BCPS []  [place], Pharm.D., BCPS, AAHIVP []  [place], Pharm.D., BCPS, AAHIVP []  Legrand Como, PharmD, BCPS []  Salome Arnt, PharmD, BCPS []  Johnnette Gourd, PharmD, BCPS []  Hughes Better, PharmD []  Leeroy Cha, PharmD, BCPS []  Laqueta Linden, PharmD  [place] Team []  [address], PharmD []  Leodis Sias, PharmD []  Lindell Spar, PharmD []  Royetta Asal, Rph []  Graylin Shiver) Rema Fendt, PharmD []  Glennon Mac, PharmD []  Arlyn Dunning, PharmD [x]  Netta Cedars, PharmD []  Dia Sitter, PharmD []  Leone Haven, PharmD []  Gretta Arab, PharmD []  Theodis Shove, PharmD []  Peggyann Juba, PharmD   Positive urine culture Treated with Cephalexin, organism sensitive to the same and no further patient follow-up is required at this time.  Reuel Boom 01/24/2019, 8:40 AM

## 2019-02-14 ENCOUNTER — Other Ambulatory Visit: Payer: Self-pay | Admitting: Adult Health

## 2019-02-14 DIAGNOSIS — Z1231 Encounter for screening mammogram for malignant neoplasm of breast: Secondary | ICD-10-CM

## 2019-03-05 ENCOUNTER — Other Ambulatory Visit: Payer: Self-pay | Admitting: Adult Health

## 2019-03-07 NOTE — Telephone Encounter (Signed)
Sent to the pharmacy by e-scribe.  Due for cpx in Jan.

## 2019-03-19 ENCOUNTER — Encounter: Payer: Self-pay | Admitting: Internal Medicine

## 2019-03-24 ENCOUNTER — Encounter: Payer: Self-pay | Admitting: Internal Medicine

## 2019-03-28 DIAGNOSIS — Z01419 Encounter for gynecological examination (general) (routine) without abnormal findings: Secondary | ICD-10-CM | POA: Diagnosis not present

## 2019-03-31 ENCOUNTER — Other Ambulatory Visit: Payer: Self-pay | Admitting: Obstetrics and Gynecology

## 2019-03-31 DIAGNOSIS — N644 Mastodynia: Secondary | ICD-10-CM

## 2019-04-03 ENCOUNTER — Ambulatory Visit (AMBULATORY_SURGERY_CENTER): Payer: Medicare HMO | Admitting: *Deleted

## 2019-04-03 ENCOUNTER — Encounter: Payer: Self-pay | Admitting: Internal Medicine

## 2019-04-03 ENCOUNTER — Other Ambulatory Visit: Payer: Self-pay

## 2019-04-03 VITALS — Temp 97.1°F | Ht 64.0 in | Wt 189.8 lb

## 2019-04-03 DIAGNOSIS — Z1159 Encounter for screening for other viral diseases: Secondary | ICD-10-CM

## 2019-04-03 DIAGNOSIS — Z1211 Encounter for screening for malignant neoplasm of colon: Secondary | ICD-10-CM

## 2019-04-03 MED ORDER — SUPREP BOWEL PREP KIT 17.5-3.13-1.6 GM/177ML PO SOLN: 1.0000 | Freq: Once | ORAL | 0 refills | Status: AC

## 2019-04-03 NOTE — Progress Notes (Signed)
No egg or soy allergy known to patient  No issues with past sedation with any surgeries  or procedures, no intubation problems  No diet pills per patient No home 02 use per patient  No blood thinners per patient  Pt states  issues with constipation - she does use dulcolax  laxatives- she uses this once a week-  Dulcolax stool softener doesn't always work - will do Dr Blanch Media 2 day prep for her  No A fib or A flutter  EMMI video sent to pt's e mail   Due to the COVID-19 pandemic we are asking patients to follow these guidelines. Please only bring one care partner. Please be aware that your care partner may wait in the car in the parking lot or if they feel like they will be too hot to wait in the car, they may wait in the lobby on the 4th floor. All care partners are required to wear a mask the entire time (we do not have any that we can provide them), they need to practice social distancing, and we will do a Covid check for all patient's and care partners when you arrive. Also we will check their temperature and your temperature. If the care partner waits in their car they need to stay in the parking lot the entire time and we will call them on their cell phone when the patient is ready for discharge so they can bring the car to the front of the building. Also all patient's will need to wear a mask into building.

## 2019-04-04 ENCOUNTER — Ambulatory Visit: Payer: Medicare HMO

## 2019-04-14 ENCOUNTER — Other Ambulatory Visit: Payer: Self-pay | Admitting: Internal Medicine

## 2019-04-14 ENCOUNTER — Ambulatory Visit (INDEPENDENT_AMBULATORY_CARE_PROVIDER_SITE_OTHER): Payer: Medicare HMO

## 2019-04-14 DIAGNOSIS — Z1159 Encounter for screening for other viral diseases: Secondary | ICD-10-CM | POA: Diagnosis not present

## 2019-04-15 LAB — SARS CORONAVIRUS 2 (TAT 6-24 HRS): SARS Coronavirus 2: NEGATIVE

## 2019-04-16 ENCOUNTER — Other Ambulatory Visit: Payer: Self-pay | Admitting: Internal Medicine

## 2019-04-16 DIAGNOSIS — K222 Esophageal obstruction: Secondary | ICD-10-CM

## 2019-04-16 DIAGNOSIS — R1310 Dysphagia, unspecified: Secondary | ICD-10-CM

## 2019-04-16 DIAGNOSIS — K219 Gastro-esophageal reflux disease without esophagitis: Secondary | ICD-10-CM

## 2019-04-17 ENCOUNTER — Ambulatory Visit (AMBULATORY_SURGERY_CENTER): Payer: Medicare HMO | Admitting: Internal Medicine

## 2019-04-17 ENCOUNTER — Encounter: Payer: Medicare HMO | Admitting: Internal Medicine

## 2019-04-17 ENCOUNTER — Other Ambulatory Visit: Payer: Self-pay

## 2019-04-17 ENCOUNTER — Encounter: Payer: Self-pay | Admitting: Internal Medicine

## 2019-04-17 VITALS — BP 112/67 | HR 76 | Temp 98.8°F | Resp 19 | Ht 64.0 in | Wt 189.8 lb

## 2019-04-17 DIAGNOSIS — M797 Fibromyalgia: Secondary | ICD-10-CM | POA: Diagnosis not present

## 2019-04-17 DIAGNOSIS — D128 Benign neoplasm of rectum: Secondary | ICD-10-CM

## 2019-04-17 DIAGNOSIS — Z1211 Encounter for screening for malignant neoplasm of colon: Secondary | ICD-10-CM | POA: Diagnosis not present

## 2019-04-17 DIAGNOSIS — K621 Rectal polyp: Secondary | ICD-10-CM | POA: Diagnosis not present

## 2019-04-17 DIAGNOSIS — K219 Gastro-esophageal reflux disease without esophagitis: Secondary | ICD-10-CM | POA: Diagnosis not present

## 2019-04-17 DIAGNOSIS — D123 Benign neoplasm of transverse colon: Secondary | ICD-10-CM | POA: Diagnosis not present

## 2019-04-17 DIAGNOSIS — F410 Panic disorder [episodic paroxysmal anxiety] without agoraphobia: Secondary | ICD-10-CM | POA: Diagnosis not present

## 2019-04-17 DIAGNOSIS — E119 Type 2 diabetes mellitus without complications: Secondary | ICD-10-CM | POA: Diagnosis not present

## 2019-04-17 MED ORDER — SODIUM CHLORIDE 0.9 % IV SOLN: 500.0000 mL | Freq: Once | INTRAVENOUS | Status: DC

## 2019-04-17 NOTE — Patient Instructions (Signed)
HandoutYOU HAD AN ENDOSCOPIC PROCEDURE TODAY AT [facility]:   Refer to the procedure report that was given to you for any specific questions about what was found during the examination.  If the procedure report does not answer your questions, please call your gastroenterologist to clarify.  If you requested that your care partner not be given the details of your procedure findings, then the procedure report has been included in a sealed envelope for you to review at your convenience later.  YOU SHOULD EXPECT: Some feelings of bloating in the abdomen. Passage of more gas than usual.  Walking can help get rid of the air that was put into your GI tract during the procedure and reduce the bloating. If you had a lower endoscopy (such as a colonoscopy or flexible sigmoidoscopy) you may notice spotting of blood in your stool or on the toilet paper. If you underwent a bowel prep for your procedure, you may not have a normal bowel movement for a few days.  Please Note:  You might notice some irritation and congestion in your nose or some drainage.  This is from the oxygen used during your procedure.  There is no need for concern and it should clear up in a day or so.  SYMPTOMS TO REPORT IMMEDIATELY:   Following lower endoscopy (colonoscopy or flexible sigmoidoscopy):  Excessive amounts of blood in the stool  Significant tenderness or worsening of abdominal pains  Swelling of the abdomen that is new, acute  Fever of 100F or higher  For urgent or emergent issues, a gastroenterologist can be reached at any hour by calling [phone].   DIET:  We do recommend a small meal at first, but then you may proceed to your regular diet.  Drink plenty of fluids but you should avoid alcoholic beverages for 24 hours.  ACTIVITY:  You should plan to take it easy for the rest of today and you should NOT DRIVE or use heavy machinery until tomorrow (because of the sedation medicines used during the  test).    FOLLOW UP: Our staff will call the number listed on your records 48-72 hours following your procedure to check on you and address any questions or concerns that you may have regarding the information given to you following your procedure. If we do not reach you, we will leave a message.  We will attempt to reach you two times.  During this call, we will ask if you have developed any symptoms of COVID 19. If you develop any symptoms (ie: fever, flu-like symptoms, shortness of breath, cough etc.) before then, please call [phone].  If you test positive for Covid 19 in the 2 weeks post procedure, please call and report this information to [name].    If any biopsies were taken you will be contacted by phone or by letter within the next 1-3 weeks.  Please call us at [phone] if you have not heard about the biopsies in 3 weeks.    SIGNATURES/CONFIDENTIALITY: You and/or your care partner have signed paperwork which will be entered into your electronic medical record.  These signatures attest to the fact that that the information above on your After Visit Summary has been reviewed and is understood.  Full responsibility of the confidentiality of this discharge information lies with you and/or your care-partner. given for polyps.

## 2019-04-17 NOTE — Progress Notes (Signed)
Temp JB VS DT  Pt's states no medical or surgical changes since previsit or office visit. 

## 2019-04-17 NOTE — Op Note (Signed)
[facility] Patient Name: Leslie Benson Procedure Date: 04/17/2019 11:16 AM MRN: [id number] Endoscopist: Docia Chuck. Henrene Pastor , MD Age: 58 Referring MD:  Date of Birth: [birth year] Gender: Female Account #: [id number] Procedure:                Colonoscopy with cold snare polypectomy x 2 Indications:              Screening for colorectal malignant neoplasm Medicines:                Monitored Anesthesia Care Procedure:                Pre-Anesthesia Assessment:                           - Prior to the procedure, a History and Physical                            was performed, and patient medications and                            allergies were reviewed. The patient's tolerance of                            previous anesthesia was also reviewed. The risks                            and benefits of the procedure and the sedation                            options and risks were discussed with the patient.                            All questions were answered, and informed consent                            was obtained. Prior Anticoagulants: The patient has                            taken no previous anticoagulant or antiplatelet                            agents. ASA Grade Assessment: II - A patient with                            mild systemic disease. After reviewing the risks                            and benefits, the patient was deemed in                            satisfactory condition to undergo the procedure.                           After obtaining informed consent, the colonoscope  was passed under direct vision. Throughout the                            procedure, the patient's blood pressure, pulse, and                            oxygen saturations were monitored continuously. The                            Colonoscope was introduced through the anus and                            advanced to the the cecum, identified by           appendiceal orifice and ileocecal valve. The                            ileocecal valve, appendiceal orifice, and rectum                            were photographed. The quality of the bowel                            preparation was excellent. The colonoscopy was                            performed without difficulty. The patient tolerated                            the procedure well. The bowel preparation used was                            SUPREP via split dose instruction. Scope In: 11:24:55 AM Scope Out: 11:40:53 AM Scope Withdrawal Time: 0 hours 12 minutes 8 seconds  Total Procedure Duration: 0 hours 15 minutes 58 seconds  Findings:                 Two polyps were found in the rectum and distal                            transverse colon. The polyps were 2 to 3 mm in                            size. These polyps were removed with a cold snare.                            Resection and retrieval were complete.                           The exam was otherwise without abnormality on                            direct and retroflexion views. Complications:            No immediate complications. Estimated blood loss:  None. Estimated Blood Loss:     Estimated blood loss: none. Impression:               - Two 2 to 3 mm polyps in the rectum and in the                            distal transverse colon, removed with a cold snare.                            Resected and retrieved.                           - The examination was otherwise normal on direct                            and retroflexion views. Recommendation:           - Repeat colonoscopy in 7-10 years for surveillance.                           - Patient has a contact number available for                            emergencies. The signs and symptoms of potential                            delayed complications were discussed with the                            patient. Return to normal  activities tomorrow.                            Written discharge instructions were provided to the                            patient.                           - Resume previous diet.                           - Continue present medications.                           - Await pathology results. Docia Chuck. Henrene Pastor, MD 04/17/2019 11:47:56 AM This report has been signed electronically.

## 2019-04-17 NOTE — Progress Notes (Signed)
Report to PACU, RN, vss, BBS= Clear.  

## 2019-04-17 NOTE — Progress Notes (Signed)
Called to room to assist during endoscopic procedure.  Patient ID and intended procedure confirmed with present staff. Received instructions for my participation in the procedure from the performing physician.  

## 2019-04-22 ENCOUNTER — Telehealth: Payer: Self-pay

## 2019-04-22 NOTE — Telephone Encounter (Signed)
Left message on 2nd follow up call. 

## 2019-04-22 NOTE — Telephone Encounter (Signed)
LVM

## 2019-04-28 ENCOUNTER — Encounter: Payer: Self-pay | Admitting: Internal Medicine

## 2019-05-02 ENCOUNTER — Other Ambulatory Visit: Payer: Self-pay | Admitting: Adult Health

## 2019-05-06 NOTE — Telephone Encounter (Signed)
Called pt several times someone picks up but says nothing. Rx will be denied pt needs an appt. For further refills.

## 2019-05-06 NOTE — Telephone Encounter (Signed)
Pt needs f/u TSH labs

## 2019-05-23 ENCOUNTER — Ambulatory Visit
Admission: RE | Admit: 2019-05-23 | Discharge: 2019-05-23 | Disposition: A | Discharge status: Home / Self Care | Payer: Medicare HMO | Source: Ambulatory Visit | Attending: Adult Health | Admitting: Adult Health

## 2019-05-23 ENCOUNTER — Other Ambulatory Visit: Payer: Self-pay

## 2019-05-23 DIAGNOSIS — Z1231 Encounter for screening mammogram for malignant neoplasm of breast: Secondary | ICD-10-CM | POA: Diagnosis not present

## 2019-07-21 ENCOUNTER — Other Ambulatory Visit: Payer: Self-pay | Admitting: Adult Health

## 2019-07-23 NOTE — Telephone Encounter (Signed)
30 DAY SUPPLY SENT TO THE PHARMACY WITH MESSAGE TO SCHEDULE APPT.  PT IS PAST DUE FOR CPX.

## 2019-08-05 ENCOUNTER — Other Ambulatory Visit: Payer: Self-pay

## 2019-08-05 ENCOUNTER — Encounter: Payer: Self-pay | Admitting: Adult Health

## 2019-08-05 ENCOUNTER — Ambulatory Visit (INDEPENDENT_AMBULATORY_CARE_PROVIDER_SITE_OTHER): Payer: Medicare HMO | Admitting: Adult Health

## 2019-08-05 ENCOUNTER — Telehealth: Payer: Self-pay | Admitting: Adult Health

## 2019-08-05 VITALS — BP 100/72 | HR 99 | Temp 97.4°F | Wt 194.0 lb

## 2019-08-05 DIAGNOSIS — Z8249 Family history of ischemic heart disease and other diseases of the circulatory system: Secondary | ICD-10-CM | POA: Diagnosis not present

## 2019-08-05 DIAGNOSIS — E7849 Other hyperlipidemia: Secondary | ICD-10-CM | POA: Diagnosis not present

## 2019-08-05 NOTE — Telephone Encounter (Signed)
The patient called to let Misty know that she is not on Cymbalta that she is taking Omprazole 20 mg  She don't know why she got that she was on cymbalta.  FYI

## 2019-08-05 NOTE — Progress Notes (Addendum)
Subjective:    Patient ID: Leslie Benson, female    DOB: Jan 02, 1961, 59 y.o.   MRN: [id number]  HPI 59 year old female who  has a past medical history of ADHD, Allergy, Anemia, Anxiety, Barrett esophagus, Breast nodule, Cervical arthritis (09/22/2013), Depression, Elevated liver enzymes, Fatty liver, Fibromyalgia, Fibromyalgia, GERD (gastroesophageal reflux disease), H/O hiatal hernia, Headache(784.0), High cholesterol, History of UTI, Lymphocytic colitis, Migraines, Pain, Panic attack, Paraesophageal hiatal hernia, Spinal stenosis, Thyroid disease, and Thyroid disease.  She has a significant family history of cardiac disease and would like to see a cardiologist to make sure her heart is ok   Family History   Father died from MI  Brother had a MI with stends  Older sister has 4 stents  Another older sister has a pacemaker  Younger sister had a MI two days ago.   She is taking her Lipitor as directed for hyperlipidemia and her last lipid panel was WNL .  She denies chest pain and shortness of breath at rest.  She does have shortness of breath with exercise but contributes this to deconditioning.  Review of Systems See HPI   Past Medical History:  Diagnosis Date  . ADHD   . Allergy   . Anemia    past hx   . Anxiety   . Barrett esophagus   . Breast nodule    benign  . Cervical arthritis 09/22/2013  . Depression   . Elevated liver enzymes   . Fatty liver   . Fibromyalgia   . Fibromyalgia   . GERD (gastroesophageal reflux disease)   . H/O hiatal hernia   . Headache(784.0)    migraines  . High cholesterol   . History of UTI   . Lymphocytic colitis   . Migraines   . Pain   . Panic attack   . Paraesophageal hiatal hernia    repaired 2009  . Spinal stenosis   . Thyroid disease   . Thyroid disease     Social History   Socioeconomic History  . Marital status: Married    Spouse name: Abe People  . Number of children: Not on file  . Years of education: Not on file  .  Highest education level: Not on file  Occupational History  . Occupation: disabled  Tobacco Use  . Smoking status: Never Smoker  . Smokeless tobacco: Never Used  Substance and Sexual Activity  . Alcohol use: Yes    Alcohol/week: 0.0 standard drinks    Comment: wine occassionally  . Drug use: No  . Sexual activity: Yes    Birth control/protection: None    Comment: n/a partial hysterectomy  Other Topics Concern  . Not on file  Social History Narrative   Married    Lived in [place].  Moved to [place] in early 2000s   She is on disability for anxiety    Social Determinants of Health   Financial Resource Strain:   . Difficulty of Paying Living Expenses:   Food Insecurity:   . Worried About Charity fundraiser in the Last Year:   . Arboriculturist in the Last Year:   Transportation Needs:   . Film/video editor (Medical):   Marland Kitchen Lack of Transportation (Non-Medical):   Physical Activity:   . Days of Exercise per Week:   . Minutes of Exercise per Session:   Stress:   . Feeling of Stress :   Social Connections:   . Frequency of Communication with Friends and  Family:   . Frequency of Social Gatherings with Friends and Family:   . Attends Religious Services:   . Active Member of Clubs or Organizations:   . Attends Archivist Meetings:   Marland Kitchen Marital Status:   Intimate Partner Violence:   . Fear of Current or Ex-Partner:   . Emotionally Abused:   Marland Kitchen Physically Abused:   . Sexually Abused:     Past Surgical History:  Procedure Laterality Date  . ANKLE SURGERY    . CHOLECYSTECTOMY  1980s?  . COLONOSCOPY  2010   Buccini  . EUS N/A 11/19/2013   Procedure: ESOPHAGEAL ENDOSCOPIC ULTRASOUND (EUS) RADIAL;  Surgeon: Arta Silence, MD;  Location: [facility];  Service: Endoscopy;  Laterality: N/A;  . LAPAROSCOPIC NISSEN FUNDOPLICATION  0000000  . LAPAROSCOPIC PARAESOPHAGEAL HERNIA REPAIR  03/05/2008   Dr Johney Maine  . TUBAL LIGATION  1990s?  . UPPER GASTROINTESTINAL  ENDOSCOPY  04/05/2018   Scarlette Shorts   . VAGINAL HYSTERECTOMY  03/04/2001    Family History  Problem Relation Age of Onset  . Alcohol abuse Other   . Elevated Lipids Other   . Heart disease Other   . Anxiety disorder Mother   . Obesity Mother   . High Cholesterol Mother   . Thyroid cancer Mother   . Depression Mother   . Breast cancer Mother   . Bone cancer Mother   . High Cholesterol Father   . Heart disease Father   . Alcoholism Father   . Colon cancer Maternal Aunt   . Colon polyps Sister   . Stomach cancer Neg Hx   . Esophageal cancer Neg Hx   . Rectal cancer Neg Hx     Allergies  Allergen Reactions  . Codeine Nausea And Vomiting  . Compazine [Prochlorperazine Edisylate]     "feels weird"  . Sulfa Antibiotics     Unknown reaction.    Current Outpatient Medications on File Prior to Visit  Medication Sig Dispense Refill  . ALPRAZolam (XANAX) 1 MG tablet Take 2 mg by mouth 3 (three) times daily as needed for anxiety (anxiety). .    . atorvastatin (LIPITOR) 20 MG tablet Take 1 tablet (20 mg total) by mouth daily. **DUE FOR YEARLY PHYSICAL** 30 tablet 0  . Cholecalciferol (PA VITAMIN D-3 GUMMY PO) Take 1 Dose by mouth daily. 1000 mg daily    . DULoxetine (CYMBALTA) 60 MG capsule 1 capsule    . gabapentin (NEURONTIN) 600 MG tablet Take 1 tablet (600 mg total) by mouth 3 (three) times daily. (Patient taking differently: Take 1,200 mg by mouth 3 (three) times daily. Dr. Toy Care) 90 tablet 0  . levothyroxine (SYNTHROID) 25 MCG tablet Take 1 tablet before breakfast 90 tablet 0  . Multiple Vitamins-Minerals (CENTRUM ADULTS) TABS Take 1 tablet by mouth.    . omega-3 acid ethyl esters (LOVAZA) 1 G capsule Take 1 g by mouth 2 (two) times daily.    Marland Kitchen omeprazole (PRILOSEC) 20 MG capsule TAKE 1 CAPSULE (20 MG TOTAL) BY MOUTH DAILY. 90 capsule 3  . OVER THE COUNTER MEDICATION Fiber Well    . QUEtiapine (SEROQUEL) 400 MG tablet Take 800 mg by mouth at bedtime.    . topiramate (TOPAMAX)  200 MG tablet     . traMADol (ULTRAM) 50 MG tablet Take 2 tablets (100 mg total) by mouth every 6 (six) hours as needed for pain. (Patient taking differently: Take 100 mg by mouth every 6 (six) hours as needed for moderate pain. Dr.  Kaur) 30 tablet 0  . venlafaxine XR (EFFEXOR-XR) 75 MG 24 hr capsule Take 75 mg by mouth 3 (three) times daily.      No current facility-administered medications on file prior to visit.    BP 100/72   Pulse 99   Temp (!) 97.4 F (36.3 C)   Wt 194 lb (88 kg)   SpO2 97%   BMI 33.30 kg/m       Objective:   Physical Exam Vitals and nursing note reviewed.  Constitutional:      Appearance: Normal appearance.  Cardiovascular:     Rate and Rhythm: Normal rate and regular rhythm.     Pulses: Normal pulses.     Heart sounds: Normal heart sounds.  Pulmonary:     Effort: Pulmonary effort is normal.     Breath sounds: Normal breath sounds.  Skin:    General: Skin is warm and dry.     Capillary Refill: Capillary refill takes less than 2 seconds.  Neurological:     General: No focal deficit present.     Mental Status: She is oriented to person, place, and time.  Psychiatric:        Mood and Affect: Mood normal.        Behavior: Behavior normal.        Thought Content: Thought content normal.        Judgment: Judgment normal.       Assessment & Plan:  1. Family history of heart attack -Due to significant family history will refer to cardiology upon patient's request.  She was advised to continue to exercise, eat a heart healthy diet, and continue to take Lipitor - Ambulatory referral to Cardiology  2. Other hyperlipidemia - Continue to take lipitor as directed   [name], NP

## 2019-08-06 NOTE — Addendum Note (Signed)
Addended by: Miles Costain T on: 08/06/2019 03:49 PM   Modules accepted: Orders

## 2019-08-06 NOTE — Telephone Encounter (Signed)
Chart updated  Nothing further needed

## 2019-08-14 ENCOUNTER — Other Ambulatory Visit: Payer: Self-pay | Admitting: Adult Health

## 2019-09-03 ENCOUNTER — Other Ambulatory Visit: Payer: Self-pay | Admitting: Adult Health

## 2019-09-04 NOTE — Telephone Encounter (Signed)
DENIED.  PAST DUE FOR CPX. 

## 2019-09-05 ENCOUNTER — Other Ambulatory Visit: Payer: Self-pay

## 2019-09-05 ENCOUNTER — Ambulatory Visit: Payer: Medicare HMO | Admitting: Cardiology

## 2019-09-05 ENCOUNTER — Encounter: Payer: Self-pay | Admitting: Cardiology

## 2019-09-05 VITALS — BP 90/60 | HR 91 | Ht 64.0 in | Wt 195.0 lb

## 2019-09-05 DIAGNOSIS — R0602 Shortness of breath: Secondary | ICD-10-CM

## 2019-09-05 DIAGNOSIS — R0600 Dyspnea, unspecified: Secondary | ICD-10-CM | POA: Diagnosis not present

## 2019-09-05 DIAGNOSIS — R079 Chest pain, unspecified: Secondary | ICD-10-CM | POA: Diagnosis not present

## 2019-09-05 DIAGNOSIS — Z8249 Family history of ischemic heart disease and other diseases of the circulatory system: Secondary | ICD-10-CM | POA: Diagnosis not present

## 2019-09-05 DIAGNOSIS — E7800 Pure hypercholesterolemia, unspecified: Secondary | ICD-10-CM

## 2019-09-05 DIAGNOSIS — R0609 Other forms of dyspnea: Secondary | ICD-10-CM

## 2019-09-05 MED ORDER — IVABRADINE HCL 5 MG PO TABS: ORAL_TABLET | ORAL | 0 refills | Status: DC

## 2019-09-05 NOTE — Progress Notes (Signed)
Cardiology Office Note:    Date:  09/05/2019   ID:  [name], DOB Sep 07, 1960, MRN [id number]  PCP:  [name], NP  Cardiologist:  No primary care provider on file.  Electrophysiologist:  None   Referring MD: [name], NP     History of Present Illness:    Leslie Benson is a 59 y.o. female here for the evaluation of coronary artery disease at the request of Nafziger, NP  Has a significant family history of coronary disease and wanted to be seen by cardiology to make sure her heart is functioning well.  Father died of MI, brother had MI, older sister has 4 stents, pacemaker and another sister, younger sister had MI recently.  She takes atorvastatin.  Denies any chest pain.  She feels some shortness of breath with exercise but thinks that this is from deconditioning.  Past Medical History:  Diagnosis Date  . ADHD   . Allergy   . Anemia    past hx   . Anxiety   . Barrett esophagus   . Breast nodule    benign  . Cervical arthritis 09/22/2013  . Depression   . Elevated liver enzymes   . Fatty liver   . Fibromyalgia   . Fibromyalgia   . GERD (gastroesophageal reflux disease)   . H/O hiatal hernia   . Headache(784.0)    migraines  . High cholesterol   . History of UTI   . Lymphocytic colitis   . Migraines   . Pain   . Panic attack   . Paraesophageal hiatal hernia    repaired 2009  . Spinal stenosis   . Thyroid disease   . Thyroid disease     Past Surgical History:  Procedure Laterality Date  . ANKLE SURGERY    . CHOLECYSTECTOMY  1980s?  . COLONOSCOPY  2010   Buccini  . EUS N/A 11/19/2013   Procedure: ESOPHAGEAL ENDOSCOPIC ULTRASOUND (EUS) RADIAL;  Surgeon: Arta Silence, MD;  Location: [facility];  Service: Endoscopy;  Laterality: N/A;  . LAPAROSCOPIC NISSEN FUNDOPLICATION  50/35/4656  . LAPAROSCOPIC PARAESOPHAGEAL HERNIA REPAIR  03/05/2008   Dr Johney Maine  . TUBAL LIGATION  1990s?  . UPPER GASTROINTESTINAL ENDOSCOPY  04/05/2018   Scarlette Shorts   . VAGINAL HYSTERECTOMY  03/04/2001    Current Medications: Current Meds  Medication Sig  . ALPRAZolam (XANAX) 1 MG tablet Take 2 mg by mouth 3 (three) times daily as needed for anxiety (anxiety). .  . atorvastatin (LIPITOR) 20 MG tablet TAKE 1 TABLET EVERY DAY (DUE FOR YEARLY PHYSICAL)  . Cholecalciferol (PA VITAMIN D-3 GUMMY PO) Take 1 Dose by mouth daily. 1000 mg daily  . gabapentin (NEURONTIN) 600 MG tablet Take 1 tablet (600 mg total) by mouth 3 (three) times daily.  Marland Kitchen levothyroxine (SYNTHROID) 25 MCG tablet Take 1 tablet before breakfast  . Multiple Vitamins-Minerals (CENTRUM ADULTS) TABS Take 1 tablet by mouth.  . omega-3 acid ethyl esters (LOVAZA) 1 G capsule Take 1 g by mouth 2 (two) times daily.  Marland Kitchen omeprazole (PRILOSEC) 20 MG capsule TAKE 1 CAPSULE (20 MG TOTAL) BY MOUTH DAILY.  Marland Kitchen OVER THE COUNTER MEDICATION Fiber Well  . QUEtiapine (SEROQUEL) 400 MG tablet Take 800 mg by mouth at bedtime.  . topiramate (TOPAMAX) 200 MG tablet   . traMADol (ULTRAM) 50 MG tablet Take 2 tablets (100 mg total) by mouth every 6 (six) hours as needed for pain. (Patient taking differently: Take 100 mg by mouth every 6 (  six) hours as needed. )  . venlafaxine XR (EFFEXOR-XR) 75 MG 24 hr capsule Take 75 mg by mouth 3 (three) times daily.      Allergies:   Codeine, Compazine [prochlorperazine edisylate], and Sulfa antibiotics   Social History   Socioeconomic History  . Marital status: Married    Spouse name: Abe People  . Number of children: Not on file  . Years of education: Not on file  . Highest education level: Not on file  Occupational History  . Occupation: disabled  Tobacco Use  . Smoking status: Never Smoker  . Smokeless tobacco: Never Used  Substance and Sexual Activity  . Alcohol use: Yes    Alcohol/week: 0.0 standard drinks    Comment: wine occassionally  . Drug use: No  . Sexual activity: Yes    Birth control/protection: None    Comment: n/a partial hysterectomy  Other  Topics Concern  . Not on file  Social History Narrative   Married    Lived in [place].  Moved to [place] in early 2000s   She is on disability for anxiety    Social Determinants of Health   Financial Resource Strain:   . Difficulty of Paying Living Expenses:   Food Insecurity:   . Worried About Charity fundraiser in the Last Year:   . Arboriculturist in the Last Year:   Transportation Needs:   . Film/video editor (Medical):   Marland Kitchen Lack of Transportation (Non-Medical):   Physical Activity:   . Days of Exercise per Week:   . Minutes of Exercise per Session:   Stress:   . Feeling of Stress :   Social Connections:   . Frequency of Communication with Friends and Family:   . Frequency of Social Gatherings with Friends and Family:   . Attends Religious Services:   . Active Member of Clubs or Organizations:   . Attends Archivist Meetings:   Marland Kitchen Marital Status:      Family History: The patient's family history includes Alcohol abuse in an other family member; Alcoholism in her father; Anxiety disorder in her mother; Bone cancer in her mother; Breast cancer in her mother; Colon cancer in her maternal aunt; Colon polyps in her sister; Depression in her mother; Elevated Lipids in an other family member; Heart disease in her father and another family member; High Cholesterol in her father and mother; Obesity in her mother; Thyroid cancer in her mother. There is no history of Stomach cancer, Esophageal cancer, or Rectal cancer.  ROS:   Please see the history of present illness.    Positive for anxiety, occasional chest pain, shortness of breath, no syncope no bleeding all other systems reviewed and are negative.  EKGs/Labs/Other Studies Reviewed:    The following studies were reviewed today:   EKG:  EKG is  ordered today.  The ekg ordered today demonstrates sinus rhythm 91 incomplete right bundle branch block  Recent Labs: 09/25/2018: ALT 23; BUN 20; Creatinine, Ser 1.08;  Potassium 4.3; Sodium 141  Recent Lipid Panel    Component Value Date/Time   CHOL 165 09/25/2018 1125   TRIG 78 09/25/2018 1125   HDL 87 09/25/2018 1125   CHOLHDL 3 02/15/2018 1042   VLDL 17.0 02/15/2018 1042   LDLCALC 62 09/25/2018 1125   LDLDIRECT 168.0 04/05/2017 1140    Physical Exam:    VS:  BP 90/60   Pulse 91   Ht 5' 4"  (1.626 m)   Wt 195 lb (  88.5 kg)   SpO2 94%   BMI 33.47 kg/m     Wt Readings from Last 3 Encounters:  09/05/19 195 lb (88.5 kg)  08/05/19 194 lb (88 kg)  04/17/19 189 lb 12.8 oz (86.1 kg)     GEN:  Well nourished, well developed in no acute distress HEENT: Normal NECK: No JVD; No carotid bruits LYMPHATICS: No lymphadenopathy CARDIAC: RRR, no murmurs, rubs, gallops RESPIRATORY:  Clear to auscultation without rales, wheezing or rhonchi  ABDOMEN: Soft, non-tender, non-distended MUSCULOSKELETAL:  No edema; No deformity  SKIN: Warm and dry NEUROLOGIC:  Alert and oriented x 3 PSYCHIATRIC:  Normal affect   ASSESSMENT:    1. Family history of early CAD   2. Pure hypercholesterolemia   3. Dyspnea on exertion   4. Shortness of breath   5. Chest pain of uncertain etiology    PLAN:    In order of problems listed above:  Early family history of coronary artery disease -Multiple first-degree relatives with significant coronary disease, MI.  Shortness of breath with exertion, chest discomfort -She does occasionally feel some shortness of breath with activity which she is been thinking is deconditioning however this could possibly be an anginal equivalent.  We will go ahead and proceed with coronary CT scan with possible FFR analysis given her symptom. -I will also check an echocardiogram to ensure proper structure and function of her heart.  Hyperlipidemia -Continue to take atorvastatin.  Anxiety -Has had panic attacks in the past.  She was nervous.  She did feel some dizziness when she was here.  Hypotension -Blood pressure is usually low  normal.  Today it was 90/60.  Hydration.  We have given her some water here.  I think it would be best for [name] to use ivabradine for heart rate slowing with CT.  Medication Adjustments/Labs and Tests Ordered: Current medicines are reviewed at length with the patient today.  Concerns regarding medicines are outlined above.  Orders Placed This Encounter  Procedures  . CT CORONARY MORPH W/CTA COR W/SCORE W/CA W/CM &/OR WO/CM  . CT CORONARY FRACTIONAL FLOW RESERVE DATA PREP  . CT CORONARY FRACTIONAL FLOW RESERVE FLUID ANALYSIS  . Basic metabolic panel  . EKG 12-Lead  . ECHOCARDIOGRAM COMPLETE   Meds ordered this encounter  Medications  . ivabradine (CORLANOR) 5 MG TABS tablet    Sig: Take 3 tablets (15 mg total) by mouth for one dose, 2 hours prior to your Coronary CT.    Dispense:  3 tablet    Refill:  0    Patient Instructions  Medication Instructions:   Your physician recommends that you continue on your current medications as directed. Please refer to the Current Medication list given to you today.  *If you need a refill on your cardiac medications before your next appointment, please call your pharmacy*  Testing/Procedures:  Your physician has requested that you have an echocardiogram. Echocardiography is a painless test that uses sound waves to create images of your heart. It provides your doctor with information about the size and shape of your heart and how well your heart's chambers and valves are working. This procedure takes approximately one hour. There are no restrictions for this procedure.  Your cardiac CT will be scheduled at one of the below locations:   [facility] [address] [place], [place] [address] [phone]  If scheduled at [facility], please arrive at the [facility] main entrance of [facility] 30 minutes prior  to test start time. Proceed to the [facility] Radiology Department (first floor) to check-in and test prep.   Please follow these instructions carefully (unless otherwise directed):  On the Night Before the Test: . Be sure to Drink plenty of water. . Do not consume any caffeinated/decaffeinated beverages or chocolate 12 hours prior to your test. . Do not take any antihistamines 12 hours prior to your test.  On the Day of the Test: . Drink plenty of water. Do not drink any water within one hour of the test. . Do not eat any food 4 hours prior to the test. . You may take your regular medications prior to the test.  . Take CORLANOR SAMPLES PROVIDED TO YOU TODAY--YOU WILL TAKE 3 TABLETS (15 MG TOTAL) BY MOUTH two hours prior to test. . FEMALES- please wear underwire-free bra if available  After the Test: . Drink plenty of water. . After receiving IV contrast, you may experience a mild flushed feeling. This is normal. . On occasion, you may experience a mild rash up to 24 hours after the test. This is not dangerous. If this occurs, you can take Benadryl 25 mg and increase your fluid intake. . If you experience trouble breathing, this can be serious. If it is severe call 911 IMMEDIATELY. If it is mild, please call our office.   Once we have confirmed authorization from your insurance company, we will call you to set up a date and time for your test.   For non-scheduling related questions, please contact the cardiac imaging nurse navigator should you have any questions/concerns: Marchia Bond, Cardiac Imaging Nurse Navigator Burley Saver, Interim Cardiac Imaging Nurse [name] [facility] Direct Office Dial: [phone]   For scheduling needs, including cancellations and rescheduling, please call [phone].     Follow-Up:  AS NEEDED WITH DR. Garen Grams ON TEST RESULTS     Signed, Candee Furbish, MD  09/05/2019 4:03 PM    [facility]

## 2019-09-05 NOTE — Patient Instructions (Signed)
Medication Instructions:   Your physician recommends that you continue on your current medications as directed. Please refer to the Current Medication list given to you today.  *If you need a refill on your cardiac medications before your next appointment, please call your pharmacy*  Testing/Procedures:  Your physician has requested that you have an echocardiogram. Echocardiography is a painless test that uses sound waves to create images of your heart. It provides your doctor with information about the size and shape of your heart and how well your heart's chambers and valves are working. This procedure takes approximately one hour. There are no restrictions for this procedure.  Your cardiac CT will be scheduled at one of the below locations:   [facility] [address] [place], [place] [address] [phone]  If scheduled at [facility], please arrive at the [facility] main entrance of [facility] 30 minutes prior to test start time. Proceed to the [facility] Radiology Department (first floor) to check-in and test prep.  Please follow these instructions carefully (unless otherwise directed):  On the Night Before the Test: . Be sure to Drink plenty of water. . Do not consume any caffeinated/decaffeinated beverages or chocolate 12 hours prior to your test. . Do not take any antihistamines 12 hours prior to your test.  On the Day of the Test: . Drink plenty of water. Do not drink any water within one hour of the test. . Do not eat any food 4 hours prior to the test. . You may take your regular medications prior to the test.  . Take CORLANOR SAMPLES PROVIDED TO YOU TODAY--YOU WILL TAKE 3 TABLETS (15 MG TOTAL) BY MOUTH two hours prior to test. . FEMALES- please wear underwire-free bra if available  After the Test: . Drink plenty of water. . After receiving IV contrast, you may experience a mild flushed feeling. This is normal. . On occasion, you may  experience a mild rash up to 24 hours after the test. This is not dangerous. If this occurs, you can take Benadryl 25 mg and increase your fluid intake. . If you experience trouble breathing, this can be serious. If it is severe call 911 IMMEDIATELY. If it is mild, please call our office.   Once we have confirmed authorization from your insurance company, we will call you to set up a date and time for your test.   For non-scheduling related questions, please contact the cardiac imaging nurse navigator should you have any questions/concerns: Marchia Bond, Cardiac Imaging Nurse Navigator Burley Saver, Interim Cardiac Imaging Nurse [place] [facility] Direct Office Dial: [phone]   For scheduling needs, including cancellations and rescheduling, please call [phone].     Follow-Up:  AS NEEDED WITH DR. Garen Grams ON TEST RESULTS

## 2019-09-10 ENCOUNTER — Other Ambulatory Visit: Payer: Self-pay

## 2019-09-10 ENCOUNTER — Ambulatory Visit (HOSPITAL_COMMUNITY): Payer: Medicare HMO | Attending: Cardiovascular Disease

## 2019-09-10 DIAGNOSIS — E785 Hyperlipidemia, unspecified: Secondary | ICD-10-CM | POA: Diagnosis not present

## 2019-09-10 DIAGNOSIS — R0600 Dyspnea, unspecified: Secondary | ICD-10-CM | POA: Diagnosis not present

## 2019-09-10 DIAGNOSIS — E7800 Pure hypercholesterolemia, unspecified: Secondary | ICD-10-CM

## 2019-09-10 DIAGNOSIS — R0609 Other forms of dyspnea: Secondary | ICD-10-CM

## 2019-09-10 DIAGNOSIS — R079 Chest pain, unspecified: Secondary | ICD-10-CM

## 2019-09-10 DIAGNOSIS — Z8249 Family history of ischemic heart disease and other diseases of the circulatory system: Secondary | ICD-10-CM | POA: Diagnosis not present

## 2019-09-10 DIAGNOSIS — R0602 Shortness of breath: Secondary | ICD-10-CM | POA: Insufficient documentation

## 2019-09-11 ENCOUNTER — Telehealth: Payer: Self-pay | Admitting: Cardiology

## 2019-09-11 NOTE — Telephone Encounter (Signed)
Leslie Benson is returning Ivy's call in regards to her Echo results.

## 2019-09-11 NOTE — Telephone Encounter (Signed)
The patient has been notified of the Echo result and verbalized understanding.  All questions (if any) were answered. Frederik Schmidt, RN 09/11/2019 3:57 PM

## 2019-09-12 ENCOUNTER — Telehealth: Payer: Self-pay | Admitting: Cardiology

## 2019-09-12 NOTE — Telephone Encounter (Signed)
New message   Patient wants to know if her CT Coronary is necessary since her echo results were good. Please advise.

## 2019-09-12 NOTE — Telephone Encounter (Signed)
Spoke with the pt and informed her of what the difference between an echo and a Coronary CT is.  Pt education provided as to what each test assess.  Advised the pt that yes, she should proceed with ordered Coronary CT.  Pt verbalized understanding and agrees with this plan. Pt was more than gracious for all the assistance provided.

## 2019-09-19 ENCOUNTER — Telehealth: Payer: Self-pay | Admitting: Adult Health

## 2019-09-19 NOTE — Telephone Encounter (Signed)
Medication Refill:  Levothyroxine   Pharmacy: [place]: [phone]

## 2019-09-23 MED ORDER — LEVOTHYROXINE SODIUM 25 MCG PO TABS: ORAL_TABLET | ORAL | 0 refills | Status: DC

## 2019-09-23 NOTE — Telephone Encounter (Signed)
Sent to the pharmacy by e-scribe. 

## 2019-09-23 NOTE — Telephone Encounter (Signed)
PT NEEDS TO BE SCHEDULED FOR CPX.  WILL FILL WHEN SCHEDULED.

## 2019-09-23 NOTE — Addendum Note (Signed)
Addended by: Miles Costain T on: 09/23/2019 03:09 PM   Modules accepted: Orders

## 2019-09-23 NOTE — Telephone Encounter (Signed)
Pt is scheduled for AWV on July 6th at 1:30pm

## 2019-10-01 ENCOUNTER — Other Ambulatory Visit: Payer: Medicare HMO

## 2019-10-01 ENCOUNTER — Telehealth (HOSPITAL_COMMUNITY): Payer: Self-pay | Admitting: *Deleted

## 2019-10-01 ENCOUNTER — Other Ambulatory Visit: Payer: Self-pay

## 2019-10-01 DIAGNOSIS — R0602 Shortness of breath: Secondary | ICD-10-CM

## 2019-10-01 DIAGNOSIS — R079 Chest pain, unspecified: Secondary | ICD-10-CM | POA: Diagnosis not present

## 2019-10-01 DIAGNOSIS — E7800 Pure hypercholesterolemia, unspecified: Secondary | ICD-10-CM | POA: Diagnosis not present

## 2019-10-01 DIAGNOSIS — R0600 Dyspnea, unspecified: Secondary | ICD-10-CM

## 2019-10-01 DIAGNOSIS — Z8249 Family history of ischemic heart disease and other diseases of the circulatory system: Secondary | ICD-10-CM | POA: Diagnosis not present

## 2019-10-01 DIAGNOSIS — R0609 Other forms of dyspnea: Secondary | ICD-10-CM

## 2019-10-01 NOTE — Telephone Encounter (Signed)
Reaching out to patient to offer assistance regarding upcoming cardiac imaging study; pt verbalizes understanding of appt date/time, parking situation and where to check in, pre-test NPO status and medications ordered, and verified current allergies; name and call back number provided for further questions should they arise  [facility] [phone] office [phone] cell  Pt informed that if she takes her Xanax she will need a driver.  Pt verbalized understanding.

## 2019-10-02 ENCOUNTER — Encounter: Payer: Medicare HMO | Admitting: *Deleted

## 2019-10-02 ENCOUNTER — Ambulatory Visit (HOSPITAL_COMMUNITY)
Admission: RE | Admit: 2019-10-02 | Discharge: 2019-10-02 | Disposition: A | Discharge status: Home / Self Care | Payer: Medicare HMO | Source: Ambulatory Visit | Attending: Cardiology | Admitting: Cardiology

## 2019-10-02 DIAGNOSIS — R0600 Dyspnea, unspecified: Secondary | ICD-10-CM | POA: Insufficient documentation

## 2019-10-02 DIAGNOSIS — Z8249 Family history of ischemic heart disease and other diseases of the circulatory system: Secondary | ICD-10-CM | POA: Insufficient documentation

## 2019-10-02 DIAGNOSIS — E7800 Pure hypercholesterolemia, unspecified: Secondary | ICD-10-CM | POA: Diagnosis not present

## 2019-10-02 DIAGNOSIS — I700 Atherosclerosis of aorta: Secondary | ICD-10-CM | POA: Insufficient documentation

## 2019-10-02 DIAGNOSIS — R0602 Shortness of breath: Secondary | ICD-10-CM | POA: Diagnosis not present

## 2019-10-02 DIAGNOSIS — Z006 Encounter for examination for normal comparison and control in clinical research program: Secondary | ICD-10-CM

## 2019-10-02 DIAGNOSIS — I2510 Atherosclerotic heart disease of native coronary artery without angina pectoris: Secondary | ICD-10-CM | POA: Diagnosis not present

## 2019-10-02 DIAGNOSIS — R079 Chest pain, unspecified: Secondary | ICD-10-CM | POA: Diagnosis not present

## 2019-10-02 DIAGNOSIS — R0609 Other forms of dyspnea: Secondary | ICD-10-CM

## 2019-10-02 LAB — BASIC METABOLIC PANEL
BUN/Creatinine Ratio: 10 — ABNORMAL LOW (ref 12–28)
BUN: 10 mg/dL (ref 8–27)
CO2: 22 mmol/L (ref 20–29)
Calcium: 10 mg/dL (ref 8.7–10.3)
Chloride: 102 mmol/L (ref 96–106)
Creatinine, Ser: 1.04 mg/dL — ABNORMAL HIGH (ref 0.57–1.00)
GFR calc Af Amer: 67 mL/min/{1.73_m2} (ref >59)
GFR calc non Af Amer: 59 mL/min/{1.73_m2} — ABNORMAL LOW (ref >59)
Glucose: 108 mg/dL — ABNORMAL HIGH (ref 65–99)
Potassium: 4.1 mmol/L (ref 3.5–5.2)
Sodium: 141 mmol/L (ref 134–144)

## 2019-10-02 MED ORDER — NITROGLYCERIN 0.4 MG SL SUBL
0.8000 mg | SUBLINGUAL_TABLET | Freq: Once | SUBLINGUAL | Status: AC
  Administered 2019-10-02: 0.8 mg via SUBLINGUAL

## 2019-10-02 MED ORDER — METOPROLOL TARTRATE 5 MG/5ML IV SOLN
INTRAVENOUS | Status: AC
  Filled 2019-10-02: qty 20

## 2019-10-02 MED ORDER — METOPROLOL TARTRATE 5 MG/5ML IV SOLN
5.0000 mg | INTRAVENOUS | Status: DC | PRN
  Administered 2019-10-02: 5 mg via INTRAVENOUS

## 2019-10-02 MED ORDER — SODIUM CHLORIDE 0.9 % IV SOLN: Freq: Once | INTRAVENOUS | Status: DC

## 2019-10-02 MED ORDER — IOHEXOL 350 MG/ML SOLN
100.0000 mL | Freq: Once | INTRAVENOUS | Status: AC | PRN
  Administered 2019-10-02: 100 mL via INTRAVENOUS

## 2019-10-02 MED ORDER — NITROGLYCERIN 0.4 MG SL SUBL
SUBLINGUAL_TABLET | SUBLINGUAL | Status: AC
  Filled 2019-10-02: qty 2

## 2019-10-02 NOTE — Research (Signed)
CADFEM Informed Consent                  Subject Name:   Leslie Benson   Subject met inclusion and exclusion criteria.  The informed consent form, study requirements and expectations were reviewed with the subject and questions and concerns were addressed prior to the signing of the consent form.  The subject verbalized understanding of the trial requirements.  The subject agreed to participate in the CADFEM trial and signed the informed consent.  The informed consent was obtained prior to performance of any protocol-specific procedures for the subject.  A copy of the signed informed consent was given to the subject and a copy was placed in the subject's medical record.   [place] Raylen Ken, Research Assistant  10/02/2019  14:01 p.m.

## 2019-10-03 ENCOUNTER — Telehealth: Payer: Self-pay

## 2019-10-03 DIAGNOSIS — R109 Unspecified abdominal pain: Secondary | ICD-10-CM

## 2019-10-03 DIAGNOSIS — E7800 Pure hypercholesterolemia, unspecified: Secondary | ICD-10-CM

## 2019-10-03 DIAGNOSIS — Z79899 Other long term (current) drug therapy: Secondary | ICD-10-CM

## 2019-10-03 DIAGNOSIS — R9389 Abnormal findings on diagnostic imaging of other specified body structures: Secondary | ICD-10-CM

## 2019-10-03 MED ORDER — ATORVASTATIN CALCIUM 40 MG PO TABS
40.0000 mg | ORAL_TABLET | Freq: Every day | ORAL | 3 refills | Status: DC
Start: 2019-10-03 — End: 2020-07-22

## 2019-10-03 NOTE — Telephone Encounter (Signed)
Follow Up:   Pt said she saw her CT on My-Chart, need somebody to explain this to her. She says she does not understand it.

## 2019-10-03 NOTE — Telephone Encounter (Signed)
She should probably set up appt as well with her GI doc, Dr. Henrene Pastor to be seen after the CT scan is done.  Candee Furbish, MD

## 2019-10-03 NOTE — Telephone Encounter (Signed)
Spoke with patient regarding results of recent Coronary CT.  Pt is aware of those results and the need to increase Atorvastatin to 40 mg a day.  Rx sent electronically into [facility] as requested (#90 x 3).  Pt scheduled 9/14 for Lipid/ALT.  Pt is also aware of incidental finding of fluid filled structure in the upper abdomen.  She is aware she will be contacted to be scheduled for a CT of the abdomen and pelvis with contrast.  Of note - pt report she has been having pain in her "stomach" and abdomen for some time.  She reports "I've just been blowing it off."  She reports having pain in right lower and sometimes in the right upper abdominal area. Will forward this information to Dr Marlou Porch for his knowledge.

## 2019-10-03 NOTE — Telephone Encounter (Signed)
Phone call from [place] with [facility] to call report on pt for Coronary CT ordered by Dr Marlou Porch.  Malachy Mood reports reading by Dr Markus Daft shows new fluid filled structure in upper abdomen adjacent to post surgical changes at the stomach.Could represent a large gastric diverticulum or complication related to previous gastric surgery.Recommend CT of abdomen/pelvis with IV and oral contrast.  Will route information to Dr Marlou Porch and his RN for review and recommendation.

## 2019-10-03 NOTE — Telephone Encounter (Signed)
Patient returning call.

## 2019-10-03 NOTE — Telephone Encounter (Signed)
Abdominal/pelvic CT scheduled 6/25.  (Pt is going out of town Monday and will return in 1 week)

## 2019-10-03 NOTE — Telephone Encounter (Signed)
Order placed for referral to Dr Henrene Pastor for further eval of abdominal pain and abnormal ct findings.

## 2019-10-03 NOTE — Telephone Encounter (Signed)
Mild coronary artery disease obstructive, nonflow limiting.   Lets go ahead and increase atorvastatin to 40 mg, high intensity dose. Recheck lipid panel and ALT in 3 months.   Also, noncardiac radiology finding was fluid-filled structure in the upper abdomen adjacent to postsurgical changes in the stomach. Dr. Anselm Pancoast recommended a dedicated CT of the abdomen and pelvis with IV and oral contrast for further characterization. We will go ahead and order.   Candee Furbish, MD    Attempted to contact pt via phone to review the results and orders.  Left message to c/b to discuss.

## 2019-10-17 ENCOUNTER — Ambulatory Visit
Admission: RE | Admit: 2019-10-17 | Discharge: 2019-10-17 | Disposition: A | Discharge status: Home / Self Care | Payer: Medicare HMO | Source: Ambulatory Visit | Attending: Cardiology | Admitting: Cardiology

## 2019-10-17 DIAGNOSIS — K7689 Other specified diseases of liver: Secondary | ICD-10-CM | POA: Diagnosis not present

## 2019-10-17 DIAGNOSIS — I700 Atherosclerosis of aorta: Secondary | ICD-10-CM | POA: Diagnosis not present

## 2019-10-17 DIAGNOSIS — R9389 Abnormal findings on diagnostic imaging of other specified body structures: Secondary | ICD-10-CM

## 2019-10-17 MED ORDER — IOPAMIDOL (ISOVUE-300) INJECTION 61%
100.0000 mL | Freq: Once | INTRAVENOUS | Status: AC | PRN
  Administered 2019-10-17: 100 mL via INTRAVENOUS

## 2019-10-23 ENCOUNTER — Telehealth: Payer: Self-pay | Admitting: Cardiology

## 2019-10-23 NOTE — Telephone Encounter (Signed)
Mild coronary artery disease obstructive, nonflow limiting.  Lets go ahead and increase atorvastatin to 40 mg, high intensity dose. Recheck lipid panel and ALT in 3 months.  Also, noncardiac radiology finding was fluid-filled structure in the upper abdomen adjacent to postsurgical changes in the stomach. Dr. Anselm Pancoast recommended a dedicated CT of the abdomen and pelvis with IV and oral contrast for further characterization. We will go ahead and order.  Candee Furbish, MD  10/17/2019 CT Abdomen pelvis: IMPRESSION: 4.7 cm fluid filled cystic structure along the left lateral aspect of the Nissen fundoplication. This likely represents partial un- doing of fundoplication wrap, with diverticulum considered less likely.  Pt aware to increase atorvastatin to 40 mg daily and recheck lipid/ALT in 3 months.  Pt does not want a new RX at this time.  Lab ordered and scheduled for 01/23/2020.  Reviewed results of CT abdomen/pelvis as above.  Pt aware and will f/u with Dr Henrene Pastor, her GI MD.  She will called with any further questions/concerns.

## 2019-10-23 NOTE — Telephone Encounter (Signed)
New message   Patient would like a call to discuss her CT Scan results. Please call.

## 2019-10-28 ENCOUNTER — Ambulatory Visit (INDEPENDENT_AMBULATORY_CARE_PROVIDER_SITE_OTHER): Payer: Medicare HMO | Admitting: Adult Health

## 2019-10-28 ENCOUNTER — Other Ambulatory Visit: Payer: Self-pay

## 2019-10-28 ENCOUNTER — Encounter: Payer: Self-pay | Admitting: Adult Health

## 2019-10-28 VITALS — BP 112/78 | Temp 98.3°F | Ht 64.0 in | Wt 193.0 lb

## 2019-10-28 DIAGNOSIS — F32A Depression, unspecified: Secondary | ICD-10-CM

## 2019-10-28 DIAGNOSIS — F419 Anxiety disorder, unspecified: Secondary | ICD-10-CM | POA: Diagnosis not present

## 2019-10-28 DIAGNOSIS — F329 Major depressive disorder, single episode, unspecified: Secondary | ICD-10-CM

## 2019-10-28 DIAGNOSIS — Z0001 Encounter for general adult medical examination with abnormal findings: Secondary | ICD-10-CM | POA: Diagnosis not present

## 2019-10-28 DIAGNOSIS — M25511 Pain in right shoulder: Secondary | ICD-10-CM

## 2019-10-28 DIAGNOSIS — Z0000 Encounter for general adult medical examination without abnormal findings: Secondary | ICD-10-CM

## 2019-10-28 DIAGNOSIS — E038 Other specified hypothyroidism: Secondary | ICD-10-CM

## 2019-10-28 DIAGNOSIS — E1169 Type 2 diabetes mellitus with other specified complication: Secondary | ICD-10-CM | POA: Diagnosis not present

## 2019-10-28 DIAGNOSIS — M797 Fibromyalgia: Secondary | ICD-10-CM

## 2019-10-28 DIAGNOSIS — E8881 Metabolic syndrome: Secondary | ICD-10-CM

## 2019-10-28 DIAGNOSIS — E785 Hyperlipidemia, unspecified: Secondary | ICD-10-CM

## 2019-10-28 LAB — COMPREHENSIVE METABOLIC PANEL
ALT: 36 U/L — ABNORMAL HIGH (ref 0–35)
AST: 25 U/L (ref 0–37)
Albumin: 4.4 g/dL (ref 3.5–5.2)
Alkaline Phosphatase: 112 U/L (ref 39–117)
BUN: 12 mg/dL (ref 6–23)
CO2: 26 mEq/L (ref 19–32)
Calcium: 9.5 mg/dL (ref 8.4–10.5)
Chloride: 105 mEq/L (ref 96–112)
Creatinine, Ser: 0.85 mg/dL (ref 0.40–1.20)
GFR: 68.05 mL/min (ref >60.00)
Glucose, Bld: 98 mg/dL (ref 70–99)
Potassium: 4 mEq/L (ref 3.5–5.1)
Sodium: 140 mEq/L (ref 135–145)
Total Bilirubin: 0.3 mg/dL (ref 0.2–1.2)
Total Protein: 7 g/dL (ref 6.0–8.3)

## 2019-10-28 LAB — LIPID PANEL
Cholesterol: 183 mg/dL (ref 0–200)
HDL: 80.2 mg/dL (ref >39.00)
LDL Cholesterol: 89 mg/dL (ref 0–99)
NonHDL: 102.95
Total CHOL/HDL Ratio: 2
Triglycerides: 72 mg/dL (ref 0.0–149.0)
VLDL: 14.4 mg/dL (ref 0.0–40.0)

## 2019-10-28 LAB — CBC WITH DIFFERENTIAL/PLATELET
Basophils Absolute: 0.1 10*3/uL (ref 0.0–0.1)
Basophils Relative: 1.3 % (ref 0.0–3.0)
Eosinophils Absolute: 0.1 10*3/uL (ref 0.0–0.7)
Eosinophils Relative: 2.5 % (ref 0.0–5.0)
HCT: 41 % (ref 36.0–46.0)
Hemoglobin: 13.7 g/dL (ref 12.0–15.0)
Lymphocytes Relative: 44.1 % (ref 12.0–46.0)
Lymphs Abs: 2 10*3/uL (ref 0.7–4.0)
MCHC: 33.5 g/dL (ref 30.0–36.0)
MCV: 92.1 fl (ref 78.0–100.0)
Monocytes Absolute: 0.4 10*3/uL (ref 0.1–1.0)
Monocytes Relative: 8.6 % (ref 3.0–12.0)
Neutro Abs: 2 10*3/uL (ref 1.4–7.7)
Neutrophils Relative %: 43.5 % (ref 43.0–77.0)
Platelets: 189 10*3/uL (ref 150.0–400.0)
RBC: 4.46 Mil/uL (ref 3.87–5.11)
RDW: 14.4 % (ref 11.5–15.5)
WBC: 4.5 10*3/uL (ref 4.0–10.5)

## 2019-10-28 LAB — TSH: TSH: 1.44 u[IU]/mL (ref 0.35–4.50)

## 2019-10-28 LAB — HEMOGLOBIN A1C: Hgb A1c MFr Bld: 5.5 % (ref 4.6–6.5)

## 2019-10-28 NOTE — Addendum Note (Signed)
Addended by: Apolinar Junes on: 10/28/2019 03:51 PM   Modules accepted: Level of Service

## 2019-10-28 NOTE — Progress Notes (Addendum)
Subjective:    Patient ID: Leslie Benson, female    DOB: 05/03/1960, 59 y.o.   MRN: [id number]  HPI  Patient presents for yearly preventative medicine examination. She is a pleasant 59 year old female who  has a past medical history of ADHD, Allergy, Anemia, Anxiety, Barrett esophagus, Breast nodule, Cervical arthritis (09/22/2013), Depression, Elevated liver enzymes, Fatty liver, Fibromyalgia, Fibromyalgia, GERD (gastroesophageal reflux disease), H/O hiatal hernia, Headache(784.0), High cholesterol, History of UTI, Lymphocytic colitis, Migraines, Pain, Panic attack, Paraesophageal hiatal hernia, Spinal stenosis, Thyroid disease, and Thyroid disease.  Hypothyroidism-takes Synthroid 25 mcg before breakfast daily. Lab Results  Component Value Date   TSH 1.530 01/23/2018   Hyperlipidemia - takes lipitor 40 mg daily and Lovaza 1 gm BID. Marland Kitchen Denies myalgia Lab Results  Component Value Date   CHOL 165 09/25/2018   HDL 87 09/25/2018   LDLCALC 62 09/25/2018   LDLDIRECT 168.0 04/05/2017   TRIG 78 09/25/2018   CHOLHDL 3 02/15/2018   Anxiety and Depression -is followed by psychiatry.  Currently prescribed Effexor and Seroquel.  She does feel controlled on this medication  Fibromyalgia-takes tramadol as needed and gabapentin daily. She is having an acute flare currently.   Insulin Resistance -was placed on Metformin when she was being seen by the weight loss clinic but is no longer going there is no longer taking Metformin. Lab Results  Component Value Date   HGBA1C 5.3 05/29/2018     Right Shoulder Pain -is her biggest complaint today.  She reports right shoulder pain for the last month or 2.  No trauma or aggravating injury.  She feels as though she has some loss of range of motion.  Pain radiates down to her bicep.  All immunizations and health maintenance protocols were reviewed with the patient and needed orders were placed. She is up to date on vaccinations   Appropriate screening  laboratory values were ordered for the patient including screening of hyperlipidemia, renal function and hepatic function.  Medication reconciliation,  past medical history, social history, problem list and allergies were reviewed in detail with the patient  Goals were established with regard to weight loss, exercise, and  diet in compliance with medications.  Wt Readings from Last 3 Encounters:  09/05/19 195 lb (88.5 kg)  08/05/19 194 lb (88 kg)  04/17/19 189 lb 12.8 oz (86.1 kg)   She is up-to-date on routine dental and vision screens as well as colon cancer screening   Review of Systems  Constitutional: Positive for fatigue (chronic ).  HENT: Negative.   Eyes: Negative.   Respiratory: Negative.   Cardiovascular: Negative.   Gastrointestinal: Negative.   Endocrine: Negative.   Genitourinary: Negative.   Musculoskeletal: Positive for arthralgias and myalgias.  Skin: Negative.   Allergic/Immunologic: Negative.   Neurological: Negative.   Hematological: Negative.   Psychiatric/Behavioral: Negative.    Past Medical History:  Diagnosis Date   ADHD    Allergy    Anemia    past hx    Anxiety    Barrett esophagus    Breast nodule    benign   Cervical arthritis 09/22/2013   Depression    Elevated liver enzymes    Fatty liver    Fibromyalgia    Fibromyalgia    GERD (gastroesophageal reflux disease)    H/O hiatal hernia    Headache(784.0)    migraines   High cholesterol    History of UTI    Lymphocytic colitis    Migraines  Pain    Panic attack    Paraesophageal hiatal hernia    repaired 2009   Spinal stenosis    Thyroid disease    Thyroid disease     Social History   Socioeconomic History   Marital status: Married    Spouse name: Abe People   Number of children: Not on file   Years of education: Not on file   Highest education level: Not on file  Occupational History   Occupation: disabled  Tobacco Use   Smoking status: Never  Smoker   Smokeless tobacco: Never Used  Scientific laboratory technician Use: Never used  Substance and Sexual Activity   Alcohol use: Yes    Alcohol/week: 0.0 standard drinks    Comment: wine occassionally   Drug use: No   Sexual activity: Yes    Birth control/protection: None    Comment: n/a partial hysterectomy  Other Topics Concern   Not on file  Social History Narrative   Married    Lived in [place].  Moved to [place] in early 2000s   She is on disability for anxiety    Social Determinants of Health   Financial Resource Strain:    Difficulty of Paying Living Expenses:   Food Insecurity:    Worried About Charity fundraiser in the Last Year:    Arboriculturist in the Last Year:   Transportation Needs:    Film/video editor (Medical):    Lack of Transportation (Non-Medical):   Physical Activity:    Days of Exercise per Week:    Minutes of Exercise per Session:   Stress:    Feeling of Stress :   Social Connections:    Frequency of Communication with Friends and Family:    Frequency of Social Gatherings with Friends and Family:    Attends Religious Services:    Active Member of Clubs or Organizations:    Attends Music therapist:    Marital Status:   Intimate Partner Violence:    Fear of Current or Ex-Partner:    Emotionally Abused:    Physically Abused:    Sexually Abused:     Past Surgical History:  Procedure Laterality Date   ANKLE SURGERY     CHOLECYSTECTOMY  1980s?   COLONOSCOPY  2010   Buccini   EUS N/A 11/19/2013   Procedure: ESOPHAGEAL ENDOSCOPIC ULTRASOUND (EUS) RADIAL;  Surgeon: Arta Silence, MD;  Location: [facility];  Service: Endoscopy;  Laterality: N/A;   LAPAROSCOPIC NISSEN FUNDOPLICATION  54/00/8676   LAPAROSCOPIC PARAESOPHAGEAL HERNIA REPAIR  03/05/2008   Dr Johney Maine   TUBAL LIGATION  1990s?   UPPER GASTROINTESTINAL ENDOSCOPY  04/05/2018   Scarlette Shorts    VAGINAL HYSTERECTOMY  03/04/2001    Family  History  Problem Relation Age of Onset   Alcohol abuse Other    Elevated Lipids Other    Heart disease Other    Anxiety disorder Mother    Obesity Mother    High Cholesterol Mother    Thyroid cancer Mother    Depression Mother    Breast cancer Mother    Bone cancer Mother    High Cholesterol Father    Heart disease Father    Alcoholism Father    Colon cancer Maternal Aunt    Colon polyps Sister    Stomach cancer Neg Hx    Esophageal cancer Neg Hx    Rectal cancer Neg Hx     Allergies  Allergen Reactions   Codeine  Nausea And Vomiting   Compazine [Prochlorperazine Edisylate]     "feels weird"   Sulfa Antibiotics     Unknown reaction.    Current Outpatient Medications on File Prior to Visit  Medication Sig Dispense Refill   ALPRAZolam (XANAX) 1 MG tablet Take 2 mg by mouth 3 (three) times daily as needed for anxiety (anxiety). Marland Kitchen     atorvastatin (LIPITOR) 40 MG tablet Take 1 tablet (40 mg total) by mouth daily. 90 tablet 3   Cholecalciferol (PA VITAMIN D-3 GUMMY PO) Take 1 Dose by mouth daily. 1000 mg daily     gabapentin (NEURONTIN) 600 MG tablet Take 1 tablet (600 mg total) by mouth 3 (three) times daily. 90 tablet 0   ivabradine (CORLANOR) 5 MG TABS tablet Take 3 tablets (15 mg total) by mouth for one dose, 2 hours prior to your Coronary CT. 3 tablet 0   levothyroxine (SYNTHROID) 25 MCG tablet Take 1 tablet before breakfast 90 tablet 0   Multiple Vitamins-Minerals (CENTRUM ADULTS) TABS Take 1 tablet by mouth.     omega-3 acid ethyl esters (LOVAZA) 1 G capsule Take 1 g by mouth 2 (two) times daily.     omeprazole (PRILOSEC) 20 MG capsule TAKE 1 CAPSULE (20 MG TOTAL) BY MOUTH DAILY. 90 capsule 3   OVER THE COUNTER MEDICATION Fiber Well     QUEtiapine (SEROQUEL) 400 MG tablet Take 800 mg by mouth at bedtime.     topiramate (TOPAMAX) 200 MG tablet      traMADol (ULTRAM) 50 MG tablet Take 2 tablets (100 mg total) by mouth every 6 (six) hours  as needed for pain. (Patient taking differently: Take 100 mg by mouth every 6 (six) hours as needed. ) 30 tablet 0   venlafaxine XR (EFFEXOR-XR) 75 MG 24 hr capsule Take 75 mg by mouth 3 (three) times daily.      No current facility-administered medications on file prior to visit.    There were no vitals taken for this visit.      Objective:   Physical Exam Vitals and nursing note reviewed.  Constitutional:      General: She is not in acute distress.    Appearance: Normal appearance. She is well-developed. She is not ill-appearing.  HENT:     Head: Normocephalic and atraumatic.     Right Ear: Tympanic membrane, ear canal and external ear normal. There is no impacted cerumen.     Left Ear: Tympanic membrane, ear canal and external ear normal. There is no impacted cerumen.     Nose: Nose normal. No congestion or rhinorrhea.     Mouth/Throat:     Mouth: Mucous membranes are moist.     Pharynx: Oropharynx is clear. No oropharyngeal exudate or posterior oropharyngeal erythema.  Eyes:     General:        Right eye: No discharge.        Left eye: No discharge.     Extraocular Movements: Extraocular movements intact.     Conjunctiva/sclera: Conjunctivae normal.     Pupils: Pupils are equal, round, and reactive to light.  Neck:     Thyroid: No thyromegaly.     Vascular: No carotid bruit.     Trachea: No tracheal deviation.  Cardiovascular:     Rate and Rhythm: Normal rate and regular rhythm.     Pulses: Normal pulses.     Heart sounds: Normal heart sounds. No murmur heard.  No friction rub. No gallop.   Pulmonary:  Effort: Pulmonary effort is normal. No respiratory distress.     Breath sounds: Normal breath sounds. No stridor. No wheezing, rhonchi or rales.  Chest:     Chest wall: No tenderness.  Abdominal:     General: Abdomen is flat. Bowel sounds are normal. There is no distension.     Palpations: Abdomen is soft. There is no mass.     Tenderness: There is no abdominal  tenderness. There is no right CVA tenderness, left CVA tenderness, guarding or rebound.     Hernia: No hernia is present.  Musculoskeletal:        General: No swelling, deformity or signs of injury.     Right shoulder: Tenderness and bony tenderness present. No crepitus. Decreased range of motion. Decreased strength.     Cervical back: Normal range of motion and neck supple.     Right lower leg: No edema.     Left lower leg: No edema.     Comments: Unable to raise arm above head or forearm back scratch test.  She had weakness and pain against pressure  Lymphadenopathy:     Cervical: No cervical adenopathy.  Skin:    General: Skin is warm and dry.     Coloration: Skin is not jaundiced or pale.     Findings: No bruising, erythema, lesion or rash.  Neurological:     General: No focal deficit present.     Mental Status: She is alert and oriented to person, place, and time.     Cranial Nerves: No cranial nerve deficit.     Sensory: No sensory deficit.     Motor: No weakness.     Coordination: Coordination normal.     Gait: Gait normal.     Deep Tendon Reflexes: Reflexes normal.  Psychiatric:        Mood and Affect: Mood normal.        Behavior: Behavior normal.        Thought Content: Thought content normal.        Judgment: Judgment normal.       Assessment & Plan:  1. Routine general medical examination at a health care facility - encouraged diet and exercise  - Needs to start losing weight  - CBC with Differential/Platelet - Comprehensive metabolic panel - Lipid panel - TSH - Hemoglobin A1c  2. Hyperlipidemia associated with type 2 diabetes mellitus ([place]) - Continue with lipitor 40 mg  - CBC with Differential/Platelet - Comprehensive metabolic panel - Lipid panel - TSH - Hemoglobin A1c  3. Acute pain of right shoulder - Possible rotator cuff injury. Will get xray to r/o bony abnormality  - Consider steroid injection  - DG Shoulder Right; Future  4. Fibromyalgia -  Continue with POC by pain management   5. Insulin resistance - Consider adding back Metformin  - CBC with Differential/Platelet - Comprehensive metabolic panel - Lipid panel - TSH - Hemoglobin A1c  6. Other specified hypothyroidism - Consider dose increase in synthroid  - CBC with Differential/Platelet - Comprehensive metabolic panel - Lipid panel - TSH - Hemoglobin A1c  7. Anxiety and depression - Continue with psychiatry plan of care   [name], NP  .

## 2019-10-28 NOTE — Patient Instructions (Signed)
I will follow up with you regarding your blood work   Please go to the [place] office for an xray of the right shoulder   Start exercising more

## 2019-11-06 ENCOUNTER — Telehealth: Payer: Self-pay

## 2019-11-10 ENCOUNTER — Ambulatory Visit (INDEPENDENT_AMBULATORY_CARE_PROVIDER_SITE_OTHER): Payer: Medicare HMO | Admitting: Adult Health

## 2019-11-10 ENCOUNTER — Encounter: Payer: Self-pay | Admitting: Adult Health

## 2019-11-10 ENCOUNTER — Ambulatory Visit (INDEPENDENT_AMBULATORY_CARE_PROVIDER_SITE_OTHER)
Admission: RE | Admit: 2019-11-10 | Discharge: 2019-11-10 | Disposition: A | Discharge status: Home / Self Care | Payer: Medicare HMO | Source: Ambulatory Visit | Attending: Adult Health | Admitting: Adult Health

## 2019-11-10 ENCOUNTER — Other Ambulatory Visit: Payer: Self-pay

## 2019-11-10 VITALS — BP 117/78 | HR 120 | Ht 64.0 in | Wt 190.0 lb

## 2019-11-10 DIAGNOSIS — G4700 Insomnia, unspecified: Secondary | ICD-10-CM

## 2019-11-10 DIAGNOSIS — F331 Major depressive disorder, recurrent, moderate: Secondary | ICD-10-CM

## 2019-11-10 DIAGNOSIS — M25511 Pain in right shoulder: Secondary | ICD-10-CM | POA: Diagnosis not present

## 2019-11-10 DIAGNOSIS — F410 Panic disorder [episodic paroxysmal anxiety] without agoraphobia: Secondary | ICD-10-CM | POA: Diagnosis not present

## 2019-11-10 DIAGNOSIS — F411 Generalized anxiety disorder: Secondary | ICD-10-CM | POA: Diagnosis not present

## 2019-11-10 NOTE — Progress Notes (Signed)
[facility] MD/PA/NP Initial Note  11/10/2019 3:39 PM Leslie Benson  MRN:  [id number]  Chief Complaint:   HPI:   Has been seeing Dr. Toy Care for several years - cannot afford to continue visits.   Describes mood today as "not the best". Pleasant. Tearful. Mood symptoms - reports depression, anxiety, and irritability. Increased anxiety "lately". Panic attacks. Hyperventilating and can't breathe. Feels like medications work "sometimes and sometimes I don't". Right hand shaking a lot. Fibromyalgia. She and husband not getting along well. Financial stressors. Had to buy a car recently - now has a $300 a month car payment. Has had to move lately. Wishes her husband would give her more "credit" than he does. He gets frustrated with her because she doesn't feel good. Husband looking at naked women online. Upcoming trip to [place]. Stable interest and motivation. Taking medications as prescribed.  Energy levels low. Feels tired all the time. Active, does not have a regular exercise routine. Walking dog.  Enjoys some usual interests and activities. Married - 5 times. Lives with husband of 12 years. Has 3 grown daughters - 2 grandchildren. Has one daughter that won't talk to her. Spending time with family. Appetite adequate. Weight 190 - down from 200 pounds. Sleeps well most nights. Averages 6 or less hours. Mind wanders and "I think of everything".  Denies daytime napping.  Focus and concentration difficulties. Has a difficult time finding words she wants to say. Hard to complete a sentence. Completing tasks. Managing aspects of household. Receives disability for Fibromyalgia - 20 years.  Denies SI or HI. Denies AH or VH.  Previous medication trials: Unknown  Visit Diagnosis:    ICD-10-CM   1. Insomnia, unspecified type  G47.00   2. Major depressive disorder, recurrent episode, moderate (HCC)  F33.1   3. Generalized anxiety disorder  F41.1   4. Panic attacks  F41.0     Past Psychiatric History:  20 years ago - [facility] - attempted suicide - overdose. 12 years ago admitted for overdose - on Seroquel and Xanax.   Past Medical History:  Past Medical History:  Diagnosis Date  . ADHD   . Allergy   . Anemia    past hx   . Anxiety   . Barrett esophagus   . Breast nodule    benign  . Cervical arthritis 09/22/2013  . Depression   . Elevated liver enzymes   . Fatty liver   . Fibromyalgia   . Fibromyalgia   . GERD (gastroesophageal reflux disease)   . H/O hiatal hernia   . Headache(784.0)    migraines  . High cholesterol   . History of UTI   . Lymphocytic colitis   . Migraines   . Pain   . Panic attack   . Paraesophageal hiatal hernia    repaired 2009  . Spinal stenosis   . Thyroid disease   . Thyroid disease     Past Surgical History:  Procedure Laterality Date  . ANKLE SURGERY    . CHOLECYSTECTOMY  1980s?  . COLONOSCOPY  2010   Buccini  . EUS N/A 11/19/2013   Procedure: ESOPHAGEAL ENDOSCOPIC ULTRASOUND (EUS) RADIAL;  Surgeon: Arta Silence, MD;  Location: [facility];  Service: Endoscopy;  Laterality: N/A;  . LAPAROSCOPIC NISSEN FUNDOPLICATION  43/15/4008  . LAPAROSCOPIC PARAESOPHAGEAL HERNIA REPAIR  03/05/2008   Dr Johney Maine  . TUBAL LIGATION  1990s?  . UPPER GASTROINTESTINAL ENDOSCOPY  04/05/2018   Scarlette Shorts   . VAGINAL HYSTERECTOMY  03/04/2001  Family Psychiatric History: Denies any family history of mental illness.  Family History:  Family History  Problem Relation Age of Onset  . Alcohol abuse Other   . Elevated Lipids Other   . Heart disease Other   . Anxiety disorder Mother   . Obesity Mother   . High Cholesterol Mother   . Thyroid cancer Mother   . Depression Mother   . Breast cancer Mother   . Bone cancer Mother   . High Cholesterol Father   . Heart disease Father   . Alcoholism Father   . Colon cancer Maternal Aunt   . Colon polyps Sister   . Stomach cancer Neg Hx   . Esophageal cancer Neg Hx   . Rectal cancer Neg Hx     Social  History:  Social History   Socioeconomic History  . Marital status: Married    Spouse name: Abe People  . Number of children: Not on file  . Years of education: Not on file  . Highest education level: Not on file  Occupational History  . Occupation: disabled  Tobacco Use  . Smoking status: Never Smoker  . Smokeless tobacco: Never Used  Vaping Use  . Vaping Use: Never used  Substance and Sexual Activity  . Alcohol use: Yes    Alcohol/week: 0.0 standard drinks    Comment: wine occassionally  . Drug use: No  . Sexual activity: Yes    Birth control/protection: None    Comment: n/a partial hysterectomy  Other Topics Concern  . Not on file  Social History Narrative   Married    Lived in [place].  Moved to [place] in early 2000s   She is on disability for anxiety    Social Determinants of Health   Financial Resource Strain:   . Difficulty of Paying Living Expenses:   Food Insecurity:   . Worried About Charity fundraiser in the Last Year:   . Arboriculturist in the Last Year:   Transportation Needs:   . Film/video editor (Medical):   Marland Kitchen Lack of Transportation (Non-Medical):   Physical Activity:   . Days of Exercise per Week:   . Minutes of Exercise per Session:   Stress:   . Feeling of Stress :   Social Connections:   . Frequency of Communication with Friends and Family:   . Frequency of Social Gatherings with Friends and Family:   . Attends Religious Services:   . Active Member of Clubs or Organizations:   . Attends Archivist Meetings:   Marland Kitchen Marital Status:     Allergies:  Allergies  Allergen Reactions  . Codeine Nausea And Vomiting  . Compazine [Prochlorperazine Edisylate]     "feels weird"  . Sulfa Antibiotics     Unknown reaction.    Metabolic Disorder Labs:  No results found for: PROLACTIN Lab Results  Component Value Date   CHOL 183 10/28/2019   TRIG 72.0 10/28/2019   HDL 80.20 10/28/2019   CHOLHDL 2 10/28/2019   VLDL 14.4 10/28/2019    LDLCALC 89 10/28/2019   LDLCALC 62 09/25/2018   Lab Results  Component Value Date   TSH 1.44 10/28/2019   TSH 1.530 01/23/2018    Therapeutic Level Labs: No results found for: LITHIUM No results found for: VALPROATE No components found for:  CBMZ  Current Medications: Current Outpatient Medications  Medication Sig Dispense Refill  . ALPRAZolam (XANAX) 1 MG tablet Take 2 mg by mouth 3 (three) times daily  as needed for anxiety (anxiety). .    . atorvastatin (LIPITOR) 40 MG tablet Take 1 tablet (40 mg total) by mouth daily. 90 tablet 3  . gabapentin (NEURONTIN) 600 MG tablet Take 1 tablet (600 mg total) by mouth 3 (three) times daily. 90 tablet 0  . ivabradine (CORLANOR) 5 MG TABS tablet Take 3 tablets (15 mg total) by mouth for one dose, 2 hours prior to your Coronary CT. 3 tablet 0  . levothyroxine (SYNTHROID) 25 MCG tablet Take 1 tablet before breakfast 90 tablet 0  . omega-3 acid ethyl esters (LOVAZA) 1 G capsule Take 1 g by mouth 2 (two) times daily.    Marland Kitchen omeprazole (PRILOSEC) 20 MG capsule TAKE 1 CAPSULE (20 MG TOTAL) BY MOUTH DAILY. 90 capsule 3  . OVER THE COUNTER MEDICATION Fiber Well    . QUEtiapine (SEROQUEL) 400 MG tablet Take 800 mg by mouth at bedtime.    . traMADol (ULTRAM) 50 MG tablet Take 2 tablets (100 mg total) by mouth every 6 (six) hours as needed for pain. (Patient taking differently: Take 100 mg by mouth every 6 (six) hours as needed. ) 30 tablet 0  . venlafaxine XR (EFFEXOR-XR) 75 MG 24 hr capsule Take 75 mg by mouth 3 (three) times daily.      No current facility-administered medications for this visit.    Medication Side Effects: none  Orders placed this visit:  No orders of the defined types were placed in this encounter.   Psychiatric Specialty Exam:  Review of Systems  Blood pressure 117/78, pulse (!) 120, height 5\' 4"  (1.626 m), weight 190 lb (86.2 kg).Body mass index is 32.61 kg/m.  General Appearance: Casual, Neat and Well Groomed  Eye  Contact:  Good  Speech:  Clear and Coherent and Normal Rate  Volume:  Normal  Mood:  Anxious, Depressed and Irritable  Affect:  Appropriate and Congruent  Thought Process:  Coherent and Descriptions of Associations: Intact  Orientation:  Full (Time, Place, and Person)  Thought Content: Logical   Suicidal Thoughts:  No  Homicidal Thoughts:  No  Memory:  WNL  Judgement:  Good  Insight:  Good  Psychomotor Activity:  Normal  Concentration:  Concentration: Good  Recall:  Good  Fund of Knowledge: Good  Language: Good  Assets:  Communication Skills Desire for Improvement Financial Resources/Insurance Housing Intimacy Leisure Time Physical Health Resilience Social Support Talents/Skills Transportation Vocational/Educational  ADL's:  Intact  Cognition: WNL  Prognosis:  Good   Screenings:  PHQ2-9     Clinical Support from 10/28/2019 in [facility] at [facility] from 01/23/2018 in [place] Office Visit from 08/13/2015 in [facility] at [facility]  PHQ-2 Total Score 4 6 0  PHQ-9 Total Score 15 19 --      Receiving Psychotherapy: No   Treatment Plan/Recommendations:   Plan:  PDMP reviewed  1. Xanax 1mg  - 6 times daily 2. Seroquel 400mg  - 2 at hs  3. Effexor XR 75mg  TID  Will not be able to prescribe Tramadol.  Will request records from previous provider - Dr Toy Care  Read and reviewed note with patient for accuracy.   RTC 4 weeks  Patient advised to contact office with any questions, adverse effects, or acute worsening in signs and symptoms.  Discussed potential benefits, risk, and side effects of benzodiazepines to include potential risk of tolerance and dependence, as well as possible drowsiness.  Advised patient not to drive if experiencing drowsiness and to take  lowest possible effective dose to minimize risk of dependence and tolerance.  Discussed potential metabolic side effects associated with atypical  antipsychotics, as well as potential risk for movement side effects. Advised pt to contact office if movement side effects occur.    Aloha Gell, NP

## 2019-11-11 ENCOUNTER — Other Ambulatory Visit: Payer: Self-pay | Admitting: Adult Health

## 2019-11-11 DIAGNOSIS — F410 Panic disorder [episodic paroxysmal anxiety] without agoraphobia: Secondary | ICD-10-CM

## 2019-11-11 NOTE — Telephone Encounter (Signed)
Is she out of medication? Not quite time for a refill.

## 2019-11-11 NOTE — Telephone Encounter (Signed)
Patient was seen yesterday and forgot to tell gina that she needs a refill on her xanax 1 mg 6x d to be sent to the [facility] on [address]

## 2019-11-12 ENCOUNTER — Other Ambulatory Visit: Payer: Self-pay

## 2019-11-12 MED ORDER — ALPRAZOLAM 1 MG PO TABS: 2.0000 mg | ORAL_TABLET | Freq: Three times a day (TID) | ORAL | 0 refills | Status: DC | PRN

## 2019-11-12 NOTE — Telephone Encounter (Signed)
Noted thank you

## 2019-11-12 NOTE — Telephone Encounter (Signed)
Script sent  

## 2019-11-12 NOTE — Patient Outreach (Signed)
[facility]) Care Management  11/12/2019  Leslie Benson 1960-05-09 [id number]   Referral Date: 11/12/19 Referral Source: [facility] EMMI Join Referral Reason: Join [facility]   Outreach Attempt:Spoke with patient. Discussed with her reason for call.  Patient declined to join [facility] services and support at this time as she changed her mind.     Plan: RN CM will close case.     Jone Baseman, RN, MSN [facility] Care Management Care Management Coordinator Direct Line [phone] Toll Free: [phone]  Fax: [phone]

## 2019-11-19 ENCOUNTER — Other Ambulatory Visit: Payer: Self-pay | Admitting: Adult Health

## 2019-11-20 NOTE — Telephone Encounter (Signed)
Sent to the pharmacy by e-scribe. 

## 2019-12-02 ENCOUNTER — Ambulatory Visit: Payer: Medicare HMO | Admitting: Adult Health

## 2019-12-10 ENCOUNTER — Other Ambulatory Visit: Payer: Self-pay | Admitting: Adult Health

## 2019-12-10 DIAGNOSIS — F410 Panic disorder [episodic paroxysmal anxiety] without agoraphobia: Secondary | ICD-10-CM

## 2019-12-10 NOTE — Telephone Encounter (Signed)
Next apt 09/01

## 2019-12-24 ENCOUNTER — Encounter: Payer: Self-pay | Admitting: Adult Health

## 2019-12-24 ENCOUNTER — Ambulatory Visit (INDEPENDENT_AMBULATORY_CARE_PROVIDER_SITE_OTHER): Payer: Medicare HMO | Admitting: Adult Health

## 2019-12-24 ENCOUNTER — Other Ambulatory Visit: Payer: Self-pay

## 2019-12-24 DIAGNOSIS — F411 Generalized anxiety disorder: Secondary | ICD-10-CM

## 2019-12-24 DIAGNOSIS — F410 Panic disorder [episodic paroxysmal anxiety] without agoraphobia: Secondary | ICD-10-CM

## 2019-12-24 DIAGNOSIS — G4700 Insomnia, unspecified: Secondary | ICD-10-CM

## 2019-12-24 DIAGNOSIS — F331 Major depressive disorder, recurrent, moderate: Secondary | ICD-10-CM | POA: Diagnosis not present

## 2019-12-24 MED ORDER — VENLAFAXINE HCL ER 75 MG PO CP24: 75.0000 mg | ORAL_CAPSULE | Freq: Three times a day (TID) | ORAL | 1 refills | Status: DC

## 2019-12-24 MED ORDER — ALPRAZOLAM 1 MG PO TABS: ORAL_TABLET | ORAL | 2 refills | Status: DC

## 2019-12-24 NOTE — Progress Notes (Signed)
Leslie Benson [id number] March 13, 1961 59 y.o.  Subjective:   Patient ID:  Leslie Benson is a 59 y.o. (DOB [birth year]) female.  Chief Complaint: No chief complaint on file.   HPI Leslie Benson presents to the office today for follow-up of GAD, MDD, panic attacks, and insomnia.  Describes mood today as "not good". Pleasant. Tearful - "a lot". Mood symptoms - reports depression, anxiety, and irritability. Stating "I've been more anxious lately". Reports panic attacks. Ongoing financial stressors - had to buy another car recently. Recent trip to [place] to visit husband's family. Talking to daughters some - except for one. Has an upcoming appointment with PCP - Dr Henrene Pastor. Stable interest and motivation. Taking medications as prescribed.  Energy levels low with Fibromyalgia. Stating "I'm tired all the time". Active, does not have a regular exercise routine.   Enjoys some usual interests and activities. Married. Lives with husband of 13 years. Has 3 grown daughters - 2 grandchildren. Spending time with family. Appetite adequate. Weight 194 pounds. Sleeps well most nights. Averages 6 hours of broken sleep. Denies daytime napping.  Focus and concentration difficulties. Completing tasks. Managing aspects of household. Receives disability benefits for Fibromyalgia - 20 years.  Denies SI or HI. Denies AH or VH.  Previous medication trials: Unknown   PHQ2-9     Clinical Support from 10/28/2019 in [place] at [facility] from 01/23/2018 in [place] Office Visit from 08/13/2015 in [place] at [facility] Total Score 4 6 0  PHQ-9 Total Score 15 19 --       Review of Systems:  Review of Systems  Musculoskeletal: Negative for gait problem.  Neurological: Negative for tremors.  Psychiatric/Behavioral:       Please refer to HPI    Medications: I have reviewed the patient's current medications.  Current Outpatient Medications   Medication Sig Dispense Refill   ALPRAZolam (XANAX) 1 MG tablet TAKE 2 TABLETS BY MOUTH THREE TIMES DAILY AS NEEDED FOR ANXIETY 180 tablet 2   atorvastatin (LIPITOR) 40 MG tablet Take 1 tablet (40 mg total) by mouth daily. 90 tablet 3   gabapentin (NEURONTIN) 600 MG tablet Take 1 tablet (600 mg total) by mouth 3 (three) times daily. 90 tablet 0   ivabradine (CORLANOR) 5 MG TABS tablet Take 3 tablets (15 mg total) by mouth for one dose, 2 hours prior to your Coronary CT. 3 tablet 0   levothyroxine (SYNTHROID) 25 MCG tablet TAKE 1 TABLET BEFORE BREAKFAST 90 tablet 1   omega-3 acid ethyl esters (LOVAZA) 1 G capsule Take 1 g by mouth 2 (two) times daily.     omeprazole (PRILOSEC) 20 MG capsule TAKE 1 CAPSULE (20 MG TOTAL) BY MOUTH DAILY. 90 capsule 3   OVER THE COUNTER MEDICATION Fiber Well     QUEtiapine (SEROQUEL) 400 MG tablet Take 800 mg by mouth at bedtime.     traMADol (ULTRAM) 50 MG tablet Take 2 tablets (100 mg total) by mouth every 6 (six) hours as needed for pain. (Patient taking differently: Take 100 mg by mouth every 6 (six) hours as needed. ) 30 tablet 0   venlafaxine XR (EFFEXOR-XR) 75 MG 24 hr capsule Take 1 capsule (75 mg total) by mouth 3 (three) times daily. 270 capsule 1   No current facility-administered medications for this visit.    Medication Side Effects: None  Allergies:  Allergies  Allergen Reactions   Codeine Nausea And Vomiting   Compazine [Prochlorperazine Edisylate]     "  feels weird"   Sulfa Antibiotics     Unknown reaction.    Past Medical History:  Diagnosis Date   ADHD    Allergy    Anemia    past hx    Anxiety    Barrett esophagus    Breast nodule    benign   Cervical arthritis 09/22/2013   Depression    Elevated liver enzymes    Fatty liver    Fibromyalgia    Fibromyalgia    GERD (gastroesophageal reflux disease)    H/O hiatal hernia    Headache(784.0)    migraines   High cholesterol    History of UTI     Lymphocytic colitis    Migraines    Pain    Panic attack    Paraesophageal hiatal hernia    repaired 2009   Spinal stenosis    Thyroid disease    Thyroid disease     Family History  Problem Relation Age of Onset   Alcohol abuse Other    Elevated Lipids Other    Heart disease Other    Anxiety disorder Mother    Obesity Mother    High Cholesterol Mother    Thyroid cancer Mother    Depression Mother    Breast cancer Mother    Bone cancer Mother    High Cholesterol Father    Heart disease Father    Alcoholism Father    Colon cancer Maternal Aunt    Colon polyps Sister    Stomach cancer Neg Hx    Esophageal cancer Neg Hx    Rectal cancer Neg Hx     Social History   Socioeconomic History   Marital status: Married    Spouse name: Holiday representative   Number of children: Not on file   Years of education: Not on file   Highest education level: Not on file  Occupational History   Occupation: disabled  Tobacco Use   Smoking status: Never Smoker   Smokeless tobacco: Never Used  Scientific laboratory technician Use: Never used  Substance and Sexual Activity   Alcohol use: Yes    Alcohol/week: 0.0 standard drinks    Comment: wine occassionally   Drug use: No   Sexual activity: Yes    Birth control/protection: None    Comment: n/a partial hysterectomy  Other Topics Concern   Not on file  Social History Narrative   Married    Lived in [place].  Moved to [place] in early 2000s   She is on disability for anxiety    Social Determinants of Health   Financial Resource Strain:    Difficulty of Paying Living Expenses: Not on file  Food Insecurity:    Worried About Charity fundraiser in the Last Year: Not on file   [employer] of Food in the Last Year: Not on file  Transportation Needs:    Lack of Transportation (Medical): Not on file   Lack of Transportation (Non-Medical): Not on file  Physical Activity:    Days of Exercise per Week: Not on file    Minutes of Exercise per Session: Not on file  Stress:    Feeling of Stress : Not on file  Social Connections:    Frequency of Communication with Friends and Family: Not on file   Frequency of Social Gatherings with Friends and Family: Not on file   Attends Religious Services: Not on file   Active Member of Clubs or Organizations: Not on file  Attends Archivist Meetings: Not on file   Marital Status: Not on file  Intimate Partner Violence:    Fear of Current or Ex-Partner: Not on file   Emotionally Abused: Not on file   Physically Abused: Not on file   Sexually Abused: Not on file    Past Medical History, Surgical history, Social history, and Family history were reviewed and updated as appropriate.   Please see review of systems for further details on the patient's review from today.   Objective:   Physical Exam:  There were no vitals taken for this visit.  Physical Exam Constitutional:      General: She is not in acute distress. Musculoskeletal:        General: No deformity.  Neurological:     Mental Status: She is alert and oriented to person, place, and time.     Coordination: Coordination normal.  Psychiatric:        Attention and Perception: Attention and perception normal. She does not perceive auditory or visual hallucinations.        Mood and Affect: Mood normal. Mood is not anxious or depressed. Affect is not labile, blunt, angry or inappropriate.        Speech: Speech normal.        Behavior: Behavior normal.        Thought Content: Thought content normal. Thought content is not paranoid or delusional. Thought content does not include homicidal or suicidal ideation. Thought content does not include homicidal or suicidal plan.        Cognition and Memory: Cognition and memory normal.        Judgment: Judgment normal.     Comments: Insight intact     Lab Review:     Component Value Date/Time   NA 140 10/28/2019 1359   NA 141 10/01/2019  1323   K 4.0 10/28/2019 1359   CL 105 10/28/2019 1359   CO2 26 10/28/2019 1359   GLUCOSE 98 10/28/2019 1359   BUN 12 10/28/2019 1359   BUN 10 10/01/2019 1323   CREATININE 0.85 10/28/2019 1359   CALCIUM 9.5 10/28/2019 1359   PROT 7.0 10/28/2019 1359   PROT 7.2 09/25/2018 1125   ALBUMIN 4.4 10/28/2019 1359   ALBUMIN 4.8 09/25/2018 1125   AST 25 10/28/2019 1359   ALT 36 (H) 10/28/2019 1359   ALKPHOS 112 10/28/2019 1359   BILITOT 0.3 10/28/2019 1359   BILITOT 0.3 09/25/2018 1125   GFRNONAA 59 (L) 10/01/2019 1323   GFRAA 67 10/01/2019 1323       Component Value Date/Time   WBC 4.5 10/28/2019 1359   RBC 4.46 10/28/2019 1359   HGB 13.7 10/28/2019 1359   HCT 41.0 10/28/2019 1359   PLT 189.0 10/28/2019 1359   MCV 92.1 10/28/2019 1359   MCH 31.3 09/22/2015 2321   MCHC 33.5 10/28/2019 1359   RDW 14.4 10/28/2019 1359   LYMPHSABS 2.0 10/28/2019 1359   MONOABS 0.4 10/28/2019 1359   EOSABS 0.1 10/28/2019 1359   BASOSABS 0.1 10/28/2019 1359    No results found for: POCLITH, LITHIUM   No results found for: PHENYTOIN, PHENOBARB, VALPROATE, CBMZ   .res Assessment: Plan:    Plan:  PDMP reviewed  1. Xanax 1mg  - 6 times daily 2. Seroquel 400mg  - 2 at hs  3. Effexor XR 75mg  TID  Will not be able to prescribe Tramadol.  Will request records from previous provider - Dr Toy Care.  Read and reviewed note with patient for accuracy.  RTC 6 months - will call in 3 months for next Xanax prescription.  Patient advised to contact office with any questions, adverse effects, or acute worsening in signs and symptoms.  Discussed potential benefits, risk, and side effects of benzodiazepines to include potential risk of tolerance and dependence, as well as possible drowsiness.  Advised patient not to drive if experiencing drowsiness and to take lowest possible effective dose to minimize risk of dependence and tolerance.  Discussed potential metabolic side effects associated with atypical  antipsychotics, as well as potential risk for movement side effects. Advised pt to contact office if movement side effects occur.    Diagnoses and all orders for this visit:  Panic attacks -     ALPRAZolam (XANAX) 1 MG tablet; TAKE 2 TABLETS BY MOUTH THREE TIMES DAILY AS NEEDED FOR ANXIETY  Generalized anxiety disorder -     venlafaxine XR (EFFEXOR-XR) 75 MG 24 hr capsule; Take 1 capsule (75 mg total) by mouth 3 (three) times daily.  Major depressive disorder, recurrent episode, moderate (HCC) -     venlafaxine XR (EFFEXOR-XR) 75 MG 24 hr capsule; Take 1 capsule (75 mg total) by mouth 3 (three) times daily.  Insomnia, unspecified type -     ALPRAZolam (XANAX) 1 MG tablet; TAKE 2 TABLETS BY MOUTH THREE TIMES DAILY AS NEEDED FOR ANXIETY     Please see After Visit Summary for patient specific instructions.  Future Appointments  Date Time Provider [place]  01/06/2020  7:45 AM [facility]  01/14/2020 10:40 AM Irene Shipper, MD [facility]  01/23/2020  8:30 AM [facility]    No orders of the defined types were placed in this encounter.   -------------------------------

## 2020-01-06 ENCOUNTER — Other Ambulatory Visit: Payer: Medicare HMO

## 2020-01-06 ENCOUNTER — Other Ambulatory Visit: Payer: Self-pay

## 2020-01-06 DIAGNOSIS — Z79899 Other long term (current) drug therapy: Secondary | ICD-10-CM

## 2020-01-06 DIAGNOSIS — E7800 Pure hypercholesterolemia, unspecified: Secondary | ICD-10-CM | POA: Diagnosis not present

## 2020-01-06 LAB — LIPID PANEL
Chol/HDL Ratio: 2 ratio (ref 0.0–4.4)
Cholesterol, Total: 171 mg/dL (ref 100–199)
HDL: 86 mg/dL (ref >39)
LDL Chol Calc (NIH): 70 mg/dL (ref 0–99)
Triglycerides: 79 mg/dL (ref 0–149)
VLDL Cholesterol Cal: 15 mg/dL (ref 5–40)

## 2020-01-06 LAB — ALT: ALT: 53 IU/L — ABNORMAL HIGH (ref 0–32)

## 2020-01-07 ENCOUNTER — Telehealth: Payer: Self-pay

## 2020-01-07 DIAGNOSIS — E7800 Pure hypercholesterolemia, unspecified: Secondary | ICD-10-CM

## 2020-01-07 NOTE — Telephone Encounter (Signed)
-----   Message from Jerline Pain, MD sent at 01/07/2020  6:52 AM EDT ----- LDL now 70. Great ALT, liver function lab, is mildly elevated (we saw this as well 2 years ago at a value of 43). This is not 3 times the upper limit of normal. No need to change medication.   Let's repeat ALT and lipid panel in 6 months to monitor  Candee Furbish, MD

## 2020-01-08 ENCOUNTER — Other Ambulatory Visit: Payer: Self-pay | Admitting: *Deleted

## 2020-01-08 DIAGNOSIS — Z79899 Other long term (current) drug therapy: Secondary | ICD-10-CM

## 2020-01-08 DIAGNOSIS — E7800 Pure hypercholesterolemia, unspecified: Secondary | ICD-10-CM

## 2020-01-08 NOTE — Progress Notes (Signed)
LDL now 70. Great  ALT, liver function lab, is mildly elevated (we saw this as well 2 years ago at a value of 43). This is not 3 times the upper limit of normal. No need to change medication.   Let's repeat ALT and lipid panel in 6 months to monitor   Candee Furbish, MD

## 2020-01-14 ENCOUNTER — Encounter: Payer: Self-pay | Admitting: Internal Medicine

## 2020-01-14 ENCOUNTER — Other Ambulatory Visit: Payer: Self-pay | Admitting: Adult Health

## 2020-01-14 ENCOUNTER — Ambulatory Visit: Payer: Medicare HMO | Admitting: Internal Medicine

## 2020-01-14 VITALS — BP 112/74 | HR 84 | Ht 64.0 in | Wt 199.4 lb

## 2020-01-14 DIAGNOSIS — F410 Panic disorder [episodic paroxysmal anxiety] without agoraphobia: Secondary | ICD-10-CM

## 2020-01-14 DIAGNOSIS — G4700 Insomnia, unspecified: Secondary | ICD-10-CM

## 2020-01-14 DIAGNOSIS — M797 Fibromyalgia: Secondary | ICD-10-CM

## 2020-01-14 DIAGNOSIS — K582 Mixed irritable bowel syndrome: Secondary | ICD-10-CM

## 2020-01-14 DIAGNOSIS — R1310 Dysphagia, unspecified: Secondary | ICD-10-CM

## 2020-01-14 DIAGNOSIS — R935 Abnormal findings on diagnostic imaging of other abdominal regions, including retroperitoneum: Secondary | ICD-10-CM | POA: Diagnosis not present

## 2020-01-14 DIAGNOSIS — R109 Unspecified abdominal pain: Secondary | ICD-10-CM

## 2020-01-14 NOTE — Patient Instructions (Signed)
You have been scheduled for an endoscopy. Please follow written instructions given to you at your visit today. If you use inhalers (even only as needed), please bring them with you on the day of your procedure.   

## 2020-01-14 NOTE — Progress Notes (Signed)
HISTORY OF PRESENT ILLNESS:  Leslie Benson is a 59 y.o. female with a history of fibromyalgia GERD, Barrett's with regression, paraesophageal hernia status post repair 2009, irritable bowel syndrome, reported history of lymphocytic colitis, fatty liver, and prior colonoscopies who was last evaluated Aug 29, 2018 via telehealth medicine regarding dysphagia post endoscopy.  See that dictation for details.  She has been dilated previously with a 54 [place] Maloney dilator without change.  Reflux symptoms are controlled with PPI.  Chronic IBS complaints continue.  Alternating bowel habits.  Occasional incontinence.  Index colonoscopy elsewhere 2010 was unremarkable.  Follow-up screening colonoscopy April 17, 2019 revealed a diminutive adenoma and hyperplastic polyp.  Examination was otherwise normal.  Follow-up in 7 to 10 years recommended.  Her last upper endoscopy was December 2019.  Her hiatal hernia repair was intact at the time.  Patient was recently evaluated by cardiology regarding chest complaints.  Strong family history of cardiac disease.  Work-up was favorable.  She did undergo cardiac CT which raised the question of cystic structure near the GE junction.  Thus, she underwent CT scan of the abdomen pelvis with contrast on October 17, 2019.  She was found to have a 4.7 cm fluid filled cystic structure along the lateral aspect of the Nissen fundoplication.  This was felt to represent partial undoing of the fundoplication wrap with diverticulum considered less likely.  She was sent here regarding this abnormality.  She tells me that her intermittent solid food dysphagia persist.  She is interested in repeat dilation.  Chronic irritable bowel complaints are unchanged.  She is status post cystectomy.  She also has a history of fatty liver.  Review of outside laboratories from July 2021, normal CBC with hemoglobin 13.7.  Normal liver tests except for ALT of 36.  She has completed her Covid vaccination  series.  REVIEW OF SYSTEMS:  All non-GI ROS negative as otherwise stated in the HPI except for sinus and allergy, Zaidi, back pain, depression, fatigue, headaches, muscle cramps, sleeping problems, excessive thirst, urinary leakage  Past Medical History:  Diagnosis Date  . ADHD   . Allergy   . Anemia    past hx   . Anxiety   . Barrett esophagus   . Breast nodule    benign  . Cervical arthritis 09/22/2013  . Depression   . Elevated liver enzymes   . Fatty liver   . Fibromyalgia   . Fibromyalgia   . GERD (gastroesophageal reflux disease)   . H/O hiatal hernia   . Headache(784.0)    migraines  . High cholesterol   . History of UTI   . Lymphocytic colitis   . Migraines   . Pain   . Panic attack   . Paraesophageal hiatal hernia    repaired 2009  . Spinal stenosis   . Thyroid disease   . Thyroid disease     Past Surgical History:  Procedure Laterality Date  . ANKLE SURGERY    . CHOLECYSTECTOMY  1980s?  . COLONOSCOPY  2010   Buccini  . EUS N/A 11/19/2013   Procedure: ESOPHAGEAL ENDOSCOPIC ULTRASOUND (EUS) RADIAL;  Surgeon: Arta Silence, MD;  Location: [facility];  Service: Endoscopy;  Laterality: N/A;  . LAPAROSCOPIC NISSEN FUNDOPLICATION  69/67/8938  . LAPAROSCOPIC PARAESOPHAGEAL HERNIA REPAIR  03/05/2008   Dr Johney Maine  . TUBAL LIGATION  1990s?  . UPPER GASTROINTESTINAL ENDOSCOPY  04/05/2018   Scarlette Shorts   . VAGINAL HYSTERECTOMY  03/04/2001    Social History Neoma Laming  [place]  reports that she has never smoked. She has never used smokeless tobacco. She reports current alcohol use. She reports that she does not use drugs.  family history includes Alcohol abuse in an other family member; Alcoholism in her father; Anxiety disorder in her mother; Bone cancer in her mother; Breast cancer in her mother; Colon cancer in her maternal aunt; Colon polyps in her sister; Depression in her mother; Elevated Lipids in an other family member; Heart disease in her father and  another family member; High Cholesterol in her father and mother; Obesity in her mother; Thyroid cancer in her mother.  Allergies  Allergen Reactions  . Codeine Nausea And Vomiting  . Compazine [Prochlorperazine Edisylate]     "feels weird"  . Sulfa Antibiotics     Unknown reaction.       PHYSICAL EXAMINATION: Vital signs: BP 112/74   Pulse 84   Ht 5\' 4"  (1.626 m)   Wt 199 lb 6.4 oz (90.4 kg)   BMI 34.23 kg/m   Constitutional: generally well-appearing, no acute distress Psychiatric: alert and oriented x3, cooperative Eyes: extraocular movements intact, anicteric, conjunctiva pink Mouth: oral pharynx moist, no lesions Neck: supple no lymphadenopathy Cardiovascular: heart regular rate and rhythm, no murmur Lungs: clear to auscultation bilaterally Abdomen: soft, point tenderness with palpation in most any region of the abdomen elsewhere, nondistended, no obvious ascites, no peritoneal signs, normal bowel sounds, no organomegaly Rectal: Omitted Extremities: no clubbing, cyanosis, or lower extremity edema bilaterally Skin: no lesions on visible extremities Neuro: No focal deficits. No asterixis.    ASSESSMENT:  1.  Dysphagia.  Intermittent solid food.  Related to prior fundoplication.  Previous dilation helpful. 2.  Abnormality on CT.  Question slipped wrap (prior fundoplication for paraesophageal hernia). 3.  History of diminutive adenomatous colon polyps on colonoscopy December 2020 4.  Irritable bowel 5.  Fibromyalgia and other general medical problems   PLAN:  1.  Schedule EGD to evaluate CT abnormality.  Also, perform balloon dilation of the esophagus.  Larger diameter than previous Maloney dilation.  Hopefully this will help her dysphagia.The nature of the procedure, as well as the risks, benefits, and alternatives were carefully and thoroughly reviewed with the patient. Ample time for discussion and questions allowed. The patient understood, was satisfied, and agreed  to proceed. 2.  Reflux precautions 3.  Continue omeprazole 4.  Recommended Metamucil 2 tablespoons daily for alternating bowel habits 5.  Surveillance colonoscopy 7 to 10 years

## 2020-01-20 ENCOUNTER — Encounter: Payer: Medicare HMO | Admitting: Internal Medicine

## 2020-01-22 ENCOUNTER — Encounter: Payer: Self-pay | Admitting: Internal Medicine

## 2020-01-22 ENCOUNTER — Ambulatory Visit (AMBULATORY_SURGERY_CENTER): Payer: Medicare HMO | Admitting: Internal Medicine

## 2020-01-22 ENCOUNTER — Other Ambulatory Visit: Payer: Self-pay

## 2020-01-22 VITALS — BP 128/74 | HR 76 | Temp 98.0°F | Resp 14 | Ht 64.0 in | Wt 199.0 lb

## 2020-01-22 DIAGNOSIS — R935 Abnormal findings on diagnostic imaging of other abdominal regions, including retroperitoneum: Secondary | ICD-10-CM

## 2020-01-22 DIAGNOSIS — R1310 Dysphagia, unspecified: Secondary | ICD-10-CM

## 2020-01-22 DIAGNOSIS — R109 Unspecified abdominal pain: Secondary | ICD-10-CM

## 2020-01-22 MED ORDER — SODIUM CHLORIDE 0.9 % IV SOLN: 500.0000 mL | Freq: Once | INTRAVENOUS | Status: DC

## 2020-01-22 NOTE — Progress Notes (Signed)
PT taken to PACU. Monitors in place. VSS. Report given to RN. 

## 2020-01-22 NOTE — Progress Notes (Signed)
Pt's states no medical or surgical changes since previsit or office visit.   C.W. vital signs. 

## 2020-01-22 NOTE — Patient Instructions (Signed)
Handout provided on Post-dilation diet-follow this for the rest of today.   YOU HAD AN ENDOSCOPIC PROCEDURE TODAY AT [facility]:   Refer to the procedure report that was given to you for any specific questions about what was found during the examination.  If the procedure report does not answer your questions, please call your gastroenterologist to clarify.  If you requested that your care partner not be given the details of your procedure findings, then the procedure report has been included in a sealed envelope for you to review at your convenience later.  YOU SHOULD EXPECT: Some feelings of bloating in the abdomen. Passage of more gas than usual.  Walking can help get rid of the air that was put into your GI tract during the procedure and reduce the bloating. If you had a lower endoscopy (such as a colonoscopy or flexible sigmoidoscopy) you may notice spotting of blood in your stool or on the toilet paper. If you underwent a bowel prep for your procedure, you may not have a normal bowel movement for a few days.  Please Note:  You might notice some irritation and congestion in your nose or some drainage.  This is from the oxygen used during your procedure.  There is no need for concern and it should clear up in a day or so.  SYMPTOMS TO REPORT IMMEDIATELY:   Following upper endoscopy (EGD)  Vomiting of blood or coffee ground material  New chest pain or pain under the shoulder blades  Painful or persistently difficult swallowing  New shortness of breath  Fever of 100F or higher  Black, tarry-looking stools  For urgent or emergent issues, a gastroenterologist can be reached at any hour by calling [phone]. Do not use MyChart messaging for urgent concerns.    DIET:  Post-dilation diet today: Clear liquids for 2 hours (until 11am), Then Soft diet (see handout) for the rest of today. Drink plenty of fluids but you should avoid alcoholic beverages for 24  hours.  ACTIVITY:  You should plan to take it easy for the rest of today and you should NOT DRIVE or use heavy machinery until tomorrow (because of the sedation medicines used during the test).    FOLLOW UP: Our staff will call the number listed on your records 48-72 hours following your procedure to check on you and address any questions or concerns that you may have regarding the information given to you following your procedure. If we do not reach you, we will leave a message.  We will attempt to reach you two times.  During this call, we will ask if you have developed any symptoms of COVID 19. If you develop any symptoms (ie: fever, flu-like symptoms, shortness of breath, cough etc.) before then, please call [phone].  If you test positive for Covid 19 in the 2 weeks post procedure, please call and report this information to [name].    If any biopsies were taken you will be contacted by phone or by letter within the next 1-3 weeks.  Please call us at [phone] if you have not heard about the biopsies in 3 weeks.    SIGNATURES/CONFIDENTIALITY: You and/or your care partner have signed paperwork which will be entered into your electronic medical record.  These signatures attest to the fact that that the information above on your After Visit Summary has been reviewed and is understood.  Full responsibility of the confidentiality of this discharge information lies with you and/or your  care-partner.  

## 2020-01-22 NOTE — Progress Notes (Signed)
Called to room to assist during endoscopic procedure.  Patient ID and intended procedure confirmed with present staff. Received instructions for my participation in the procedure from the performing physician.  

## 2020-01-22 NOTE — Op Note (Signed)
[place] Patient Name: Leslie Benson Procedure Date: 01/22/2020 8:39 AM MRN: [id number] Endoscopist: Docia Chuck. Henrene Pastor , MD Age: 59 Referring MD:  Date of Birth: 1960-07-02 Gender: Female Account #: [id number] Procedure:                Upper GI endoscopy with balloon dilation of the                            esophagus?"20 mm Indications:              Abdominal pain, Dysphagia, Abnormal CT of the GI                            tract Medicines:                Monitored Anesthesia Care Procedure:                Pre-Anesthesia Assessment:                           - Prior to the procedure, a History and Physical                            was performed, and patient medications and                            allergies were reviewed. The patient's tolerance of                            previous anesthesia was also reviewed. The risks                            and benefits of the procedure and the sedation                            options and risks were discussed with the patient.                            All questions were answered, and informed consent                            was obtained. Prior Anticoagulants: The patient has                            taken no previous anticoagulant or antiplatelet                            agents. ASA Grade Assessment: II - A patient with                            mild systemic disease. After reviewing the risks                            and benefits, the patient was deemed in  satisfactory condition to undergo the procedure.                           After obtaining informed consent, the endoscope was                            passed under direct vision. Throughout the                            procedure, the patient's blood pressure, pulse, and                            oxygen saturations were monitored continuously. The                            Endoscope was introduced through the mouth, and                             advanced to the second part of duodenum. The upper                            GI endoscopy was accomplished without difficulty.                            The patient tolerated the procedure well. Scope In: Scope Out: Findings:                 The esophagus was normal. No significant resistance                            at the gastroesophageal junction from prior                            fundoplication. After completing the endoscopic                            survey, A TTS dilator was passed through the scope.                            Dilation with an 18-19-20 mm balloon dilator was                            performed to 20 mm.                           The stomach was normal with evidence of prior                            fundoplication.                           The examined duodenum was normal.                           The cardia and gastric fundus were normal on  retroflexion. Complications:            No immediate complications. Estimated Blood Loss:     Estimated blood loss: none. Impression:               - Normal esophagus. Dilated.                           - Normal stomach status post intact fundoplication.                           - Normal examined duodenum.                           - No specimens collected. Recommendation:           - Patient has a contact number available for                            emergencies. The signs and symptoms of potential                            delayed complications were discussed with the                            patient. Return to normal activities tomorrow.                            Written discharge instructions were provided to the                            patient.                           - Post dilation diet.                           - Continue present medications.                           -Return to the care of your primary provider. GI                             follow-up as needed Docia Chuck. Henrene Pastor, MD 01/22/2020 9:04:28 AM This report has been signed electronically.

## 2020-01-22 NOTE — Progress Notes (Signed)
Pt c/o mid upper abd soreness after EGD. Pt passing gas with belching and per rectum. Pt given Levsin 0.25mg  and reports some relief of soreness after medication. MD evaluated pt and reports that she is likely having discomfort from gas. Pt continues to pass gas at time of discharge. Pt stable for discharge.

## 2020-01-23 ENCOUNTER — Other Ambulatory Visit: Payer: Medicare HMO

## 2020-01-26 ENCOUNTER — Telehealth: Payer: Self-pay

## 2020-01-26 NOTE — Telephone Encounter (Signed)
  Follow up Call-  Call back number 01/22/2020 04/17/2019 04/23/2018  Post procedure Call Back phone  # [phone]  Permission to leave phone message Yes Yes Yes  Some recent data might be hidden     Patient questions:  Do you have a fever, pain , or abdominal swelling? No. Pain Score  0 *  Have you tolerated food without any problems? Yes.    Have you been able to return to your normal activities? Yes.    Do you have any questions about your discharge instructions: Diet   No. Medications  No. Follow up visit  No.  Do you have questions or concerns about your Care? No.  Actions: * If pain score is 4 or above: 1. No action needed, pain <4.Have you developed a fever since your procedure? no  2.   Have you had an respiratory symptoms (SOB or cough) since your procedure? no  3.   Have you tested positive for COVID 19 since your procedure no  4.   Have you had any family members/close contacts diagnosed with the COVID 19 since your procedure?  no   If yes to any of these questions please route to Joylene John, RN and Joella Prince, RN

## 2020-01-26 NOTE — Telephone Encounter (Signed)
NO ANSWER, MESSAGE LEFT FOR PATIENT. 

## 2020-01-27 ENCOUNTER — Telehealth: Payer: Self-pay | Admitting: *Deleted

## 2020-01-27 NOTE — Telephone Encounter (Signed)
Called patient for 90 day phone call follow up for CADFEM study left message for patent to call back or send e-mail        Leslie Benson  01/27/2020  14:36 p.m.

## 2020-03-31 ENCOUNTER — Telehealth: Payer: Self-pay | Admitting: Adult Health

## 2020-03-31 NOTE — Telephone Encounter (Signed)
Patient called and stated that her insurance changed and doesn't cover her thyroid medication and wanted to see if an alternate be called in to [place], [place]  Phone:  [phone] Fax:  [phone]  CB is [phone]

## 2020-04-02 ENCOUNTER — Other Ambulatory Visit: Payer: Self-pay | Admitting: Adult Health

## 2020-04-02 MED ORDER — LEVOTHYROXINE SODIUM 25 MCG PO TABS: 25.0000 ug | ORAL_TABLET | Freq: Every day | ORAL | 3 refills | Status: DC

## 2020-04-02 NOTE — Telephone Encounter (Signed)
Patient aware Rx  Sent in

## 2020-04-07 ENCOUNTER — Other Ambulatory Visit: Payer: Self-pay | Admitting: Adult Health

## 2020-04-07 DIAGNOSIS — G4700 Insomnia, unspecified: Secondary | ICD-10-CM

## 2020-04-07 DIAGNOSIS — F410 Panic disorder [episodic paroxysmal anxiety] without agoraphobia: Secondary | ICD-10-CM

## 2020-05-11 ENCOUNTER — Ambulatory Visit: Payer: Medicare HMO

## 2020-05-24 ENCOUNTER — Ambulatory Visit: Payer: Medicare HMO

## 2020-06-07 ENCOUNTER — Other Ambulatory Visit: Payer: Self-pay | Admitting: Adult Health

## 2020-06-07 DIAGNOSIS — F410 Panic disorder [episodic paroxysmal anxiety] without agoraphobia: Secondary | ICD-10-CM

## 2020-06-07 DIAGNOSIS — G4700 Insomnia, unspecified: Secondary | ICD-10-CM

## 2020-06-08 ENCOUNTER — Telehealth: Payer: Self-pay

## 2020-06-08 NOTE — Telephone Encounter (Signed)
Received a phone call from patient's insurance [employer] [phone] requesting a prior authorization for the high dose of Alprazolam 1 mg 2 tablets tid prn anxiety #180   Reference # [id number]  All clinical questions answered over the phone and submitted, representative reports notification will be via fax in 3-5 days.

## 2020-06-21 ENCOUNTER — Other Ambulatory Visit: Payer: Self-pay | Admitting: Adult Health

## 2020-06-21 DIAGNOSIS — F331 Major depressive disorder, recurrent, moderate: Secondary | ICD-10-CM

## 2020-06-21 DIAGNOSIS — F411 Generalized anxiety disorder: Secondary | ICD-10-CM

## 2020-06-22 ENCOUNTER — Ambulatory Visit (INDEPENDENT_AMBULATORY_CARE_PROVIDER_SITE_OTHER): Payer: Medicare HMO | Admitting: Adult Health

## 2020-06-22 ENCOUNTER — Other Ambulatory Visit: Payer: Self-pay

## 2020-06-22 ENCOUNTER — Encounter: Payer: Self-pay | Admitting: Adult Health

## 2020-06-22 DIAGNOSIS — F411 Generalized anxiety disorder: Secondary | ICD-10-CM

## 2020-06-22 DIAGNOSIS — F331 Major depressive disorder, recurrent, moderate: Secondary | ICD-10-CM

## 2020-06-22 DIAGNOSIS — G4700 Insomnia, unspecified: Secondary | ICD-10-CM | POA: Diagnosis not present

## 2020-06-22 DIAGNOSIS — F410 Panic disorder [episodic paroxysmal anxiety] without agoraphobia: Secondary | ICD-10-CM

## 2020-06-22 MED ORDER — ALPRAZOLAM 1 MG PO TABS: ORAL_TABLET | ORAL | 2 refills | Status: DC

## 2020-06-22 NOTE — Progress Notes (Signed)
Leslie Benson [id number] 16-Jun-1960 60 y.o.  Subjective:   Patient ID:  Leslie Benson is a 60 y.o. (DOB 09-09-1960) female.  Chief Complaint: No chief complaint on file.   HPI Leslie Benson presents to the office today for follow-up of GAD, MDD, panic attacks, and insomnia.  Describes mood today as "not too good". Pleasant. Tearful - "misses grandchildren". Mood symptoms - reports depression, anxiety, and irritability. Reports panic attacks. Stating "my life is not good, and I don't know what to do about it". Mother passed away from cancer in 04/21/20. Having trouble communicating with girls. Still not seeing grandchildren. She and husband having issues. Has not been "intimate" with husband in 1 months. Fibromyalgia "flaring" up. Feels tired all the time. Ongoing financial stressors. Varying interest and motivation. Taking medications as prescribed.  Energy levels low. Active, does not have a regular exercise routine.   Enjoys some usual interests and activities. Married. Lives with husband of 13 years. Has 3 grown daughters - 2 grandchildren. Spending time with family. Talking to sisters. Appetite adequate. Weight gain - 194 pounds. Sleeps well most nights. Averages 6 hours. Napping 2 hours during the day.  Focus and concentration difficulties. Completing tasks. Managing aspects of household. Receives disability benefits for Fibromyalgia - 20 years.  Denies SI or HI.  Denies AH or VH.  Previous medication trials: Unknown    PHQ2-9   Flowsheet Row Clinical Support from 10/28/2019 in [place] [facility] [place] from 01/23/2018 in [place] Office Visit from 08/13/2015 in [place] at [place] Total Score 4 6 0  PHQ-9 Total Score 15 19 --       Review of Systems:  Review of Systems  Musculoskeletal: Negative for gait problem.  Neurological: Negative for tremors.  Psychiatric/Behavioral:       Please refer to  HPI    Medications: I have reviewed the patient's current medications.  Current Outpatient Medications  Medication Sig Dispense Refill  . ALPRAZolam (XANAX) 1 MG tablet TAKE 2 TABLETS BY MOUTH THREE TIMES DAILY AS NEEDED FOR ANXIETY 180 tablet 0  . atorvastatin (LIPITOR) 40 MG tablet Take 1 tablet (40 mg total) by mouth daily. 90 tablet 3  . gabapentin (NEURONTIN) 600 MG tablet Take 1 tablet (600 mg total) by mouth 3 (three) times daily. 90 tablet 0  . ivabradine (CORLANOR) 5 MG TABS tablet Take 3 tablets (15 mg total) by mouth for one dose, 2 hours prior to your Coronary CT. 3 tablet 0  . levothyroxine (SYNTHROID) 25 MCG tablet Take 1 tablet (25 mcg total) by mouth daily before breakfast. 90 tablet 3  . omega-3 acid ethyl esters (LOVAZA) 1 G capsule Take 1 g by mouth 2 (two) times daily.    Marland Kitchen omeprazole (PRILOSEC) 20 MG capsule TAKE 1 CAPSULE (20 MG TOTAL) BY MOUTH DAILY. 90 capsule 3  . OVER THE COUNTER MEDICATION Fiber Well    . QUEtiapine (SEROQUEL) 400 MG tablet Take 800 mg by mouth at bedtime.    . traMADol (ULTRAM) 50 MG tablet Take 2 tablets (100 mg total) by mouth every 6 (six) hours as needed for pain. (Patient taking differently: Take 100 mg by mouth every 6 (six) hours as needed. ) 30 tablet 0  . venlafaxine XR (EFFEXOR-XR) 75 MG 24 hr capsule TAKE 1 CAPSULE THREE TIMES DAILY 270 capsule 1   No current facility-administered medications for this visit.    Medication Side Effects: None  Allergies:  Allergies  Allergen Reactions  . Codeine Nausea And Vomiting  . Compazine [Prochlorperazine Edisylate]     "feels weird"  . Sulfa Antibiotics     Unknown reaction.    Past Medical History:  Diagnosis Date  . ADHD   . Allergy   . Anemia    past hx   . Anxiety   . Barrett esophagus   . Breast nodule    benign  . Cervical arthritis 09/22/2013  . Depression   . Elevated liver enzymes   . Fatty liver   . Fibromyalgia   . Fibromyalgia   . GERD (gastroesophageal reflux  disease)   . H/O hiatal hernia   . Headache(784.0)    migraines  . High cholesterol   . History of UTI   . Lymphocytic colitis   . Migraines   . Pain   . Panic attack   . Paraesophageal hiatal hernia    repaired 2009  . Spinal stenosis   . Thyroid disease   . Thyroid disease     Family History  Problem Relation Age of Onset  . Alcohol abuse Other   . Elevated Lipids Other   . Heart disease Other   . Anxiety disorder Mother   . Obesity Mother   . High Cholesterol Mother   . Thyroid cancer Mother   . Depression Mother   . Breast cancer Mother   . Bone cancer Mother   . High Cholesterol Father   . Heart disease Father   . Alcoholism Father   . Colon cancer Maternal Aunt   . Colon polyps Sister   . Stomach cancer Neg Hx   . Esophageal cancer Neg Hx   . Rectal cancer Neg Hx     Social History   Socioeconomic History  . Marital status: Married    Spouse name: Abe People  . Number of children: Not on file  . Years of education: Not on file  . Highest education level: Not on file  Occupational History  . Occupation: disabled  Tobacco Use  . Smoking status: Never Smoker  . Smokeless tobacco: Never Used  Vaping Use  . Vaping Use: Never used  Substance and Sexual Activity  . Alcohol use: Yes    Alcohol/week: 0.0 standard drinks    Comment: wine occassionally  . Drug use: No  . Sexual activity: Yes    Birth control/protection: None    Comment: n/a partial hysterectomy  Other Topics Concern  . Not on file  Social History Narrative   Married    Lived in [place].  Moved to [place] in early 2000s   She is on disability for anxiety    Social Determinants of Health   Financial Resource Strain: Not on file  Food Insecurity: Not on file  Transportation Needs: Not on file  Physical Activity: Not on file  Stress: Not on file  Social Connections: Not on file  Intimate Partner Violence: Not on file    Past Medical History, Surgical history, Social history, and  Family history were reviewed and updated as appropriate.   Please see review of systems for further details on the patient's review from today.   Objective:   Physical Exam:  There were no vitals taken for this visit.  Physical Exam Constitutional:      General: She is not in acute distress. Musculoskeletal:        General: No deformity.  Neurological:     Mental Status: She is alert and oriented to person,  place, and time.     Coordination: Coordination normal.  Psychiatric:        Attention and Perception: Attention and perception normal. She does not perceive auditory or visual hallucinations.        Mood and Affect: Mood normal. Mood is not anxious or depressed. Affect is not labile, blunt, angry or inappropriate.        Speech: Speech normal.        Behavior: Behavior normal.        Thought Content: Thought content normal. Thought content is not paranoid or delusional. Thought content does not include homicidal or suicidal ideation. Thought content does not include homicidal or suicidal plan.        Cognition and Memory: Cognition and memory normal.        Judgment: Judgment normal.     Comments: Insight intact     Lab Review:     Component Value Date/Time   NA 140 10/28/2019 1359   NA 141 10/01/2019 1323   K 4.0 10/28/2019 1359   CL 105 10/28/2019 1359   CO2 26 10/28/2019 1359   GLUCOSE 98 10/28/2019 1359   BUN 12 10/28/2019 1359   BUN 10 10/01/2019 1323   CREATININE 0.85 10/28/2019 1359   CALCIUM 9.5 10/28/2019 1359   PROT 7.0 10/28/2019 1359   PROT 7.2 09/25/2018 1125   ALBUMIN 4.4 10/28/2019 1359   ALBUMIN 4.8 09/25/2018 1125   AST 25 10/28/2019 1359   ALT 53 (H) 01/06/2020 0950   ALKPHOS 112 10/28/2019 1359   BILITOT 0.3 10/28/2019 1359   BILITOT 0.3 09/25/2018 1125   GFRNONAA 59 (L) 10/01/2019 1323   GFRAA 67 10/01/2019 1323       Component Value Date/Time   WBC 4.5 10/28/2019 1359   RBC 4.46 10/28/2019 1359   HGB 13.7 10/28/2019 1359   HCT 41.0  10/28/2019 1359   PLT 189.0 10/28/2019 1359   MCV 92.1 10/28/2019 1359   MCH 31.3 09/22/2015 2321   MCHC 33.5 10/28/2019 1359   RDW 14.4 10/28/2019 1359   LYMPHSABS 2.0 10/28/2019 1359   MONOABS 0.4 10/28/2019 1359   EOSABS 0.1 10/28/2019 1359   BASOSABS 0.1 10/28/2019 1359    No results found for: POCLITH, LITHIUM   No results found for: PHENYTOIN, PHENOBARB, VALPROATE, CBMZ   .res Assessment: Plan:    Plan:  PDMP reviewed  1. Xanax 1mg  - 6 times daily 2. Seroquel 400mg  - 2 at hs  3. Effexor XR 75mg  TID  RTC 6 months - will call in 3 months for next Xanax prescription.  Patient advised to contact office with any questions, adverse effects, or acute worsening in signs and symptoms.  Discussed potential benefits, risk, and side effects of benzodiazepines to include potential risk of tolerance and dependence, as well as possible drowsiness.  Advised patient not to drive if experiencing drowsiness and to take lowest possible effective dose to minimize risk of dependence and tolerance.  Discussed potential metabolic side effects associated with atypical antipsychotics, as well as potential risk for movement side effects. Advised pt to contact office if movement side effects occur.    There are no diagnoses linked to this encounter.   Please see After Visit Summary for patient specific instructions.  Future Appointments  Date Time Provider [place]  07/06/2020  9:00 AM [facility]    No orders of the defined types were placed in this encounter.   -------------------------------

## 2020-06-28 ENCOUNTER — Other Ambulatory Visit: Payer: Self-pay | Admitting: Adult Health

## 2020-06-28 DIAGNOSIS — F331 Major depressive disorder, recurrent, moderate: Secondary | ICD-10-CM

## 2020-06-28 DIAGNOSIS — F411 Generalized anxiety disorder: Secondary | ICD-10-CM

## 2020-06-28 MED ORDER — VENLAFAXINE HCL ER 75 MG PO CP24: ORAL_CAPSULE | ORAL | 1 refills | Status: DC

## 2020-06-29 ENCOUNTER — Telehealth: Payer: Self-pay | Admitting: Adult Health

## 2020-06-29 NOTE — Telephone Encounter (Signed)
Left message for patient to call back and schedule Medicare Annual Wellness Visit (AWV) either virtually or in office. No detailed message left    Last AWV 08/13/15   please schedule at anytime with [facility] Nurse Health Advisor 1 or 2   This should be a 45 minute visit.

## 2020-07-04 ENCOUNTER — Other Ambulatory Visit: Payer: Self-pay | Admitting: Adult Health

## 2020-07-04 DIAGNOSIS — G4700 Insomnia, unspecified: Secondary | ICD-10-CM

## 2020-07-04 DIAGNOSIS — F410 Panic disorder [episodic paroxysmal anxiety] without agoraphobia: Secondary | ICD-10-CM

## 2020-07-06 ENCOUNTER — Other Ambulatory Visit: Payer: Medicare HMO

## 2020-07-07 ENCOUNTER — Other Ambulatory Visit: Payer: Self-pay | Admitting: Internal Medicine

## 2020-07-07 DIAGNOSIS — K222 Esophageal obstruction: Secondary | ICD-10-CM

## 2020-07-07 DIAGNOSIS — K219 Gastro-esophageal reflux disease without esophagitis: Secondary | ICD-10-CM

## 2020-07-07 DIAGNOSIS — R1310 Dysphagia, unspecified: Secondary | ICD-10-CM

## 2020-07-09 ENCOUNTER — Telehealth: Payer: Self-pay | Admitting: Adult Health

## 2020-07-09 NOTE — Telephone Encounter (Signed)
Needs to be sent to prescribing provider.

## 2020-07-09 NOTE — Telephone Encounter (Signed)
[facility] called requesting 90 day Rx for Gabapentin 600 mg. Looks like from another doctor. Apt 9/1. Pharmacy # [phone] fax [phone]. May be red flag.Marland KitchenMarland Kitchen?

## 2020-07-09 NOTE — Telephone Encounter (Signed)
I did receive a refill request from [facility], I wasn't sure if you were taking over her Gabapentin or just sent to wrong provider?

## 2020-07-09 NOTE — Telephone Encounter (Signed)
Please review

## 2020-07-14 NOTE — Telephone Encounter (Signed)
Refill request from Leslie Benson [facility] denied gabapentin and refer to her prescribing physician for this medication.

## 2020-07-15 ENCOUNTER — Telehealth: Payer: Self-pay | Admitting: Adult Health

## 2020-07-15 NOTE — Telephone Encounter (Signed)
Please review

## 2020-07-15 NOTE — Telephone Encounter (Signed)
Patient called in today with refill for Gabapentin 600mg . States that she has been waiting for a little while and hasn't heard anything back. She states if she pays out of pocket she can only afford 30 pills and that with [facility] it is free. She also would like to know if she stops taking will she have withdrawls as she has been on this medication since about 60 years old. Pharmacy [facility] [phone]. Pls call [phone]

## 2020-07-15 NOTE — Telephone Encounter (Signed)
Gabapentin not prescribed by this office.

## 2020-07-16 ENCOUNTER — Telehealth: Payer: Self-pay | Admitting: Adult Health

## 2020-07-16 ENCOUNTER — Other Ambulatory Visit: Payer: Self-pay | Admitting: Adult Health

## 2020-07-16 MED ORDER — GABAPENTIN 600 MG PO TABS: 600.0000 mg | ORAL_TABLET | Freq: Three times a day (TID) | ORAL | 1 refills | Status: DC

## 2020-07-16 MED ORDER — GABAPENTIN 600 MG PO TABS: 600.0000 mg | ORAL_TABLET | Freq: Three times a day (TID) | ORAL | 0 refills | Status: DC

## 2020-07-16 NOTE — Telephone Encounter (Signed)
That is fine, I will send it in.   I know her old psychiatrist was also prescribing her Tramadol as well. I will not be able to fill that though

## 2020-07-16 NOTE — Telephone Encounter (Signed)
Spoke with the patient. She is aware that it will be ok for her to go without the medication for 2 days.

## 2020-07-16 NOTE — Telephone Encounter (Signed)
Spoke with the patient, she is aware that [facility] will fill the gabapentin but not the tramadol. The patient asked that this prescription be sent to [facility]. I have canceled the prescription at [facility] and sent a new prescription to [facility]. She stated she has 6 days of medication and it usually takes 8 days for her to get medications from [place], she would like to know if she will be ok without the medication for 2 days. Please advise.

## 2020-07-16 NOTE — Telephone Encounter (Signed)
She should be fine for two days

## 2020-07-16 NOTE — Telephone Encounter (Signed)
The patient is needing a refill on this Rx. Her phycologist was refilling the medication until she changed phycologist and the new one won't prescribe the medication.    gabapentin (NEURONTIN) 600 MG tablet  [place], [place] Phone:  [phone]  Fax:  [phone]

## 2020-07-16 NOTE — Addendum Note (Signed)
Addended by: Rebecca Eaton on: 07/16/2020 01:08 PM   Modules accepted: Orders

## 2020-07-16 NOTE — Telephone Encounter (Signed)
Please advise. Will you take over prescribing this medication?

## 2020-07-19 NOTE — Telephone Encounter (Signed)
The patient was needing the 90 day supply sent to  [place], [place] Phone:  [phone]  Fax:  [phone]     She called last week to have the Rx that was sent to [facility] canceled and sent to [facility]  Please advise

## 2020-07-20 MED ORDER — GABAPENTIN 600 MG PO TABS: 600.0000 mg | ORAL_TABLET | Freq: Three times a day (TID) | ORAL | 0 refills | Status: DC

## 2020-07-20 NOTE — Telephone Encounter (Signed)
90-day supply of Gabapentin was sent to [facility].

## 2020-07-20 NOTE — Addendum Note (Signed)
Addended by: Agnes Lawrence on: 07/20/2020 10:16 AM   Modules accepted: Orders

## 2020-07-21 ENCOUNTER — Other Ambulatory Visit: Payer: Self-pay | Admitting: Cardiology

## 2020-08-05 ENCOUNTER — Other Ambulatory Visit: Payer: Self-pay | Admitting: Adult Health

## 2020-08-05 DIAGNOSIS — F410 Panic disorder [episodic paroxysmal anxiety] without agoraphobia: Secondary | ICD-10-CM

## 2020-08-05 DIAGNOSIS — G4700 Insomnia, unspecified: Secondary | ICD-10-CM

## 2020-09-02 ENCOUNTER — Other Ambulatory Visit: Payer: Self-pay | Admitting: Adult Health

## 2020-09-02 DIAGNOSIS — F410 Panic disorder [episodic paroxysmal anxiety] without agoraphobia: Secondary | ICD-10-CM

## 2020-09-02 DIAGNOSIS — G4700 Insomnia, unspecified: Secondary | ICD-10-CM

## 2020-09-03 NOTE — Telephone Encounter (Signed)
Next apt 9/01 Last refill 4/14

## 2020-09-27 ENCOUNTER — Other Ambulatory Visit: Payer: Self-pay | Admitting: Cardiology

## 2020-10-04 ENCOUNTER — Other Ambulatory Visit: Payer: Self-pay | Admitting: Adult Health

## 2020-10-04 DIAGNOSIS — G4700 Insomnia, unspecified: Secondary | ICD-10-CM

## 2020-10-04 DIAGNOSIS — F410 Panic disorder [episodic paroxysmal anxiety] without agoraphobia: Secondary | ICD-10-CM

## 2020-10-04 NOTE — Telephone Encounter (Signed)
Last filled 09/03/20 appt on 12/23/20

## 2020-11-03 ENCOUNTER — Other Ambulatory Visit: Payer: Self-pay | Admitting: Adult Health

## 2020-11-03 DIAGNOSIS — G4700 Insomnia, unspecified: Secondary | ICD-10-CM

## 2020-11-03 DIAGNOSIS — F410 Panic disorder [episodic paroxysmal anxiety] without agoraphobia: Secondary | ICD-10-CM

## 2020-11-04 NOTE — Telephone Encounter (Signed)
Please send. Next apt 9/01

## 2020-11-10 ENCOUNTER — Other Ambulatory Visit: Payer: Self-pay | Admitting: Adult Health

## 2020-11-10 DIAGNOSIS — F331 Major depressive disorder, recurrent, moderate: Secondary | ICD-10-CM

## 2020-11-10 DIAGNOSIS — F411 Generalized anxiety disorder: Secondary | ICD-10-CM

## 2020-11-11 ENCOUNTER — Ambulatory Visit: Payer: Medicare HMO

## 2020-11-18 NOTE — Progress Notes (Deleted)
Subjective:   Leslie Benson is a 60 y.o. female who presents for an Initial Medicare Annual Wellness Visit.  Review of Systems    N/a       Objective:    There were no vitals filed for this visit. There is no height or weight on file to calculate BMI.  Advanced Directives 01/20/2019 09/22/2015 08/22/2015 08/19/2014 07/29/2014 07/04/2014 11/19/2013  Does Patient Have a Medical Advance Directive? No No No No No No Patient does not have advance directive  Would patient like information on creating a medical advance directive? No - Patient declined No - patient declined information - No - patient declined information No - patient declined information - -  Pre-existing out of facility DNR order (yellow form or pink MOST form) - - - - - - No    Current Medications (verified) Outpatient Encounter Medications as of 11/18/2020  Medication Sig   ALPRAZolam (XANAX) 1 MG tablet TAKE 2 TABLETS BY MOUTH THREE TIMES DAILY AS NEEDED FOR ANXIETY   atorvastatin (LIPITOR) 40 MG tablet Take 1 tablet (40 mg total) by mouth daily. Patient is as needed follow up with Dr Marlou Porch, further refills will need to be authorized by patients PCP   gabapentin (NEURONTIN) 600 MG tablet Take 1 tablet (600 mg total) by mouth 3 (three) times daily.   gabapentin (NEURONTIN) 600 MG tablet Take 1 tablet (600 mg total) by mouth 3 (three) times daily.   ivabradine (CORLANOR) 5 MG TABS tablet Take 3 tablets (15 mg total) by mouth for one dose, 2 hours prior to your Coronary CT.   levothyroxine (SYNTHROID) 25 MCG tablet Take 1 tablet (25 mcg total) by mouth daily before breakfast.   omega-3 acid ethyl esters (LOVAZA) 1 G capsule Take 1 g by mouth 2 (two) times daily.   omeprazole (PRILOSEC) 20 MG capsule TAKE 1 CAPSULE EVERY DAY   OVER THE COUNTER MEDICATION Fiber Well   QUEtiapine (SEROQUEL) 400 MG tablet Take 800 mg by mouth at bedtime.   traMADol (ULTRAM) 50 MG tablet Take 2 tablets (100 mg total) by mouth every 6 (six) hours  as needed for pain. (Patient taking differently: Take 100 mg by mouth every 6 (six) hours as needed. )   venlafaxine XR (EFFEXOR-XR) 75 MG 24 hr capsule TAKE 3 CAPSULES EVERY DAY   No facility-administered encounter medications on file as of 11/18/2020.    Allergies (verified) Codeine, Compazine [prochlorperazine edisylate], and Sulfa antibiotics   History: Past Medical History:  Diagnosis Date   ADHD    Allergy    Anemia    past hx    Anxiety    Barrett esophagus    Breast nodule    benign   Cervical arthritis 09/22/2013   Depression    Elevated liver enzymes    Fatty liver    Fibromyalgia    Fibromyalgia    GERD (gastroesophageal reflux disease)    H/O hiatal hernia    Headache(784.0)    migraines   High cholesterol    History of UTI    Lymphocytic colitis    Migraines    Pain    Panic attack    Paraesophageal hiatal hernia    repaired 2009   Spinal stenosis    Thyroid disease    Thyroid disease    Past Surgical History:  Procedure Laterality Date   ANKLE SURGERY     CHOLECYSTECTOMY  1980s?   COLONOSCOPY  2010   Buccini   EUS N/A 11/19/2013  Procedure: ESOPHAGEAL ENDOSCOPIC ULTRASOUND (EUS) RADIAL;  Surgeon: Arta Silence, MD;  Location: [facility];  Service: Endoscopy;  Laterality: N/A;   LAPAROSCOPIC NISSEN FUNDOPLICATION  0000000   LAPAROSCOPIC PARAESOPHAGEAL HERNIA REPAIR  03/05/2008   Dr Johney Maine   TUBAL LIGATION  1990s?   UPPER GASTROINTESTINAL ENDOSCOPY  04/05/2018   Scarlette Shorts    VAGINAL HYSTERECTOMY  03/04/2001   Family History  Problem Relation Age of Onset   Alcohol abuse Other    Elevated Lipids Other    Heart disease Other    Anxiety disorder Mother    Obesity Mother    High Cholesterol Mother    Thyroid cancer Mother    Depression Mother    Breast cancer Mother    Bone cancer Mother    High Cholesterol Father    Heart disease Father    Alcoholism Father    Colon cancer Maternal Aunt    Colon polyps Sister    Stomach cancer Neg  Hx    Esophageal cancer Neg Hx    Rectal cancer Neg Hx    Social History   Socioeconomic History   Marital status: Married    Spouse name: Leslie Benson   Number of children: Not on file   Years of education: Not on file   Highest education level: Not on file  Occupational History   Occupation: disabled  Tobacco Use   Smoking status: Never   Smokeless tobacco: Never  Vaping Use   Vaping Use: Never used  Substance and Sexual Activity   Alcohol use: Yes    Alcohol/week: 0.0 standard drinks    Comment: wine occassionally   Drug use: No   Sexual activity: Yes    Birth control/protection: None    Comment: n/a partial hysterectomy  Other Topics Concern   Not on file  Social History Narrative   Married    Lived in [place].  Moved to [place] in early 2000s   She is on disability for anxiety    Social Determinants of Health   Financial Resource Strain: Not on file  Food Insecurity: Not on file  Transportation Needs: Not on file  Physical Activity: Not on file  Stress: Not on file  Social Connections: Not on file    Tobacco Counseling Counseling given: Not Answered   Clinical Intake:                 Diabetic?no         Activities of Daily Living No flowsheet data found.  Patient Care Team: [name], NP as PCP - General (Family Medicine) Mayra Neer, MD (Family Medicine) Ronald Lobo, MD as Consulting Physician (Gastroenterology) Barbaraann Rondo, MD as Consulting Physician (Gynecology) Ronald Lobo, MD as Consulting Physician (Gastroenterology) Barbaraann Rondo, MD as Consulting Physician (Gynecology)  Indicate any recent Medical Services you may have received from other than [facility] providers in the past year (date may be approximate).     Assessment:   This is a routine wellness examination for Leslie Benson.  Hearing/Vision screen No results found.  Dietary issues and exercise activities discussed:     Goals Addressed   None     Depression Screen PHQ 2/9 Scores 10/28/2019 01/23/2018 08/13/2015  PHQ - 2 Score 4 6 0  PHQ- 9 Score 15 19 -  Exception Documentation - Medical reason -    Fall Risk Fall Risk  10/28/2019 08/13/2015  Falls in the past year? 1 Yes  Number falls in past yr: 1 2 or more  Injury with Fall? 0 No    FALL RISK PREVENTION PERTAINING TO THE HOME:  Any stairs in or around the home? {YES/NO:21197} If so, are there any without handrails? {YES/NO:21197} Home free of loose throw rugs in walkways, pet beds, electrical cords, etc? {YES/NO:21197} Adequate lighting in your home to reduce risk of falls? {YES/NO:21197}  ASSISTIVE DEVICES UTILIZED TO PREVENT FALLS:  Life alert? {YES/NO:21197} Use of a cane, walker or w/c? {YES/NO:21197} Grab bars in the bathroom? {YES/NO:21197} Shower chair or bench in shower? {YES/NO:21197} Elevated toilet seat or a handicapped toilet? {YES/NO:21197}  TIMED UP AND GO:  Was the test performed? {YES/NO:21197}.  Length of time to ambulate 10 feet: *** sec.   {Appearance of YN:9739091  Cognitive Function:        Immunizations Immunization History  Administered Date(s) Administered   Influenza,inj,Quad PF,6+ Mos 05/17/2020   PFIZER(Purple Top)SARS-COV-2 Vaccination 08/23/2020   Tdap 04/05/2017    {TDAP status:2101805}  {Flu Vaccine status:2101806}  {Pneumococcal vaccine status:2101807}  {Covid-19 vaccine status:2101808}  Qualifies for Shingles Vaccine? {YES/NO:21197}  Zostavax completed {YES/NO:21197}  {Shingrix Completed?:2101804}  Screening Tests Health Maintenance  Topic Date Due   PNEUMOCOCCAL POLYSACCHARIDE VACCINE AGE 34-64 HIGH RISK  Never done   Pneumococcal Vaccine 66-47 Years old (1 - PCV) Never done   HIV Screening  Never done   Zoster Vaccines- Shingrix (1 of 2) Never done   COVID-19 Vaccine (2 - Pfizer risk series) 09/13/2020   INFLUENZA VACCINE  11/22/2020   MAMMOGRAM  05/22/2021   COLONOSCOPY (Pts 45-77yr Insurance coverage  will need to be confirmed)  04/16/2026   TETANUS/TDAP  04/06/2027   Hepatitis C Screening  Completed   HPV VACCINES  Aged Out   FOOT EXAM  Discontinued   HEMOGLOBIN A1C  Discontinued   OPHTHALMOLOGY EXAM  Discontinued   URINE MICROALBUMIN  Discontinued    Health Maintenance  Health Maintenance Due  Topic Date Due   PNEUMOCOCCAL POLYSACCHARIDE VACCINE AGE 34-64 HIGH RISK  Never done   Pneumococcal Vaccine 049658Years old (1 - PCV) Never done   HIV Screening  Never done   Zoster Vaccines- Shingrix (1 of 2) Never done   COVID-19 Vaccine (2 - Pfizer risk series) 09/13/2020    {Colorectal cancer screening:2101809}  {Mammogram status:21018020}  {Bone Density status:21018021}  Lung Cancer Screening: (Low Dose CT Chest recommended if Age 37-80 years, 30 pack-year currently smoking OR have quit w/in 15years.) {DOES NOT does:27190::"does not"} qualify.   Lung Cancer Screening Referral: ***  Additional Screening:  Hepatitis C Screening: {DOES NOT does:27190::"does not"} qualify; Completed ***  Vision Screening: Recommended annual ophthalmology exams for early detection of glaucoma and other disorders of the eye. Is the patient up to date with their annual eye exam?  {YES/NO:21197} Who is the provider or what is the name of the office in which the patient attends annual eye exams? *** If pt is not established with a provider, would they like to be referred to a provider to establish care? {YES/NO:21197}.   Dental Screening: Recommended annual dental exams for proper oral hygiene  Community Resource Referral / Chronic Care Management: CRR required this visit?  {YES/NO:21197}  CCM required this visit?  {YES/NO:21197}     Plan:     I have personally reviewed and noted the following in the patient's chart:   Medical and social history Use of alcohol, tobacco or illicit drugs  Current medications and supplements including opioid prescriptions. {Opioid  Prescriptions:[phone]} Functional ability and status Nutritional status Physical activity Advanced  directives List of other physicians Hospitalizations, surgeries, and ER visits in previous 12 months Vitals Screenings to include cognitive, depression, and falls Referrals and appointments  In addition, I have reviewed and discussed with patient certain preventive protocols, quality metrics, and best practice recommendations. A written personalized care plan for preventive services as well as general preventive health recommendations were provided to patient.     Randel Pigg, LPN   [id number]   Nurse Notes: ***

## 2020-11-22 ENCOUNTER — Telehealth: Payer: Self-pay

## 2020-11-23 ENCOUNTER — Other Ambulatory Visit: Payer: Self-pay | Admitting: Adult Health

## 2020-11-23 NOTE — Telephone Encounter (Signed)
FYI  Spoke to pt and she stated that the Rx for gabepentin was suppose to say take 2 tablets TID. I advised pt that her chart does not reflect that and she state that [facility] changed her Rx to 1 tab TID. I advised pt that they did not change anything based off chart she is to take 1 tab TID. Pt also advised that she is due for a CPE. Pt stated that she needs the refill A.S.A.p due to having extreme amount of anxiety. I advised pt that gabepentin is for nerve pain and that her alprazalam is for anxiety. Pt also asked if she had the 2 Rx mixed up bc the alprazolam does say 2tablets TID. Pt denies the mix up. Pt advised to call to schedule an appt for CPE and for wellness exam. Pt advised that she should go over medications at the office when she comes in. Pt also advised that medication will be filled when appt. Is made. Pt verbalized understanding and stated she will call and schedule both.

## 2020-11-24 ENCOUNTER — Other Ambulatory Visit: Payer: Self-pay

## 2020-11-24 MED ORDER — GABAPENTIN 600 MG PO TABS: 600.0000 mg | ORAL_TABLET | Freq: Three times a day (TID) | ORAL | 0 refills | Status: DC

## 2020-11-30 ENCOUNTER — Encounter: Payer: Self-pay | Admitting: Adult Health

## 2020-11-30 ENCOUNTER — Ambulatory Visit: Payer: Self-pay | Admitting: Adult Health

## 2020-11-30 ENCOUNTER — Ambulatory Visit (INDEPENDENT_AMBULATORY_CARE_PROVIDER_SITE_OTHER): Payer: Medicare HMO | Admitting: Adult Health

## 2020-11-30 ENCOUNTER — Other Ambulatory Visit: Payer: Self-pay

## 2020-11-30 ENCOUNTER — Ambulatory Visit (INDEPENDENT_AMBULATORY_CARE_PROVIDER_SITE_OTHER): Payer: Medicare HMO

## 2020-11-30 VITALS — BP 110/60 | HR 106 | Temp 99.1°F | Ht 64.0 in | Wt 200.0 lb

## 2020-11-30 DIAGNOSIS — F419 Anxiety disorder, unspecified: Secondary | ICD-10-CM

## 2020-11-30 DIAGNOSIS — E7849 Other hyperlipidemia: Secondary | ICD-10-CM | POA: Diagnosis not present

## 2020-11-30 DIAGNOSIS — Z0000 Encounter for general adult medical examination without abnormal findings: Secondary | ICD-10-CM

## 2020-11-30 DIAGNOSIS — E038 Other specified hypothyroidism: Secondary | ICD-10-CM

## 2020-11-30 DIAGNOSIS — F32A Depression, unspecified: Secondary | ICD-10-CM

## 2020-11-30 DIAGNOSIS — M797 Fibromyalgia: Secondary | ICD-10-CM | POA: Diagnosis not present

## 2020-11-30 LAB — COMPREHENSIVE METABOLIC PANEL
ALT: 35 U/L (ref 0–35)
AST: 24 U/L (ref 0–37)
Albumin: 4.5 g/dL (ref 3.5–5.2)
Alkaline Phosphatase: 121 U/L — ABNORMAL HIGH (ref 39–117)
BUN: 12 mg/dL (ref 6–23)
CO2: 27 mEq/L (ref 19–32)
Calcium: 9.8 mg/dL (ref 8.4–10.5)
Chloride: 102 mEq/L (ref 96–112)
Creatinine, Ser: 0.91 mg/dL (ref 0.40–1.20)
GFR: 67.93 mL/min (ref >60.00)
Glucose, Bld: 89 mg/dL (ref 70–99)
Potassium: 4.5 mEq/L (ref 3.5–5.1)
Sodium: 138 mEq/L (ref 135–145)
Total Bilirubin: 0.3 mg/dL (ref 0.2–1.2)
Total Protein: 7 g/dL (ref 6.0–8.3)

## 2020-11-30 LAB — CBC WITH DIFFERENTIAL/PLATELET
Basophils Absolute: 0 10*3/uL (ref 0.0–0.1)
Basophils Relative: 0.8 % (ref 0.0–3.0)
Eosinophils Absolute: 0.1 10*3/uL (ref 0.0–0.7)
Eosinophils Relative: 1.9 % (ref 0.0–5.0)
HCT: 40.7 % (ref 36.0–46.0)
Hemoglobin: 13.5 g/dL (ref 12.0–15.0)
Lymphocytes Relative: 44.9 % (ref 12.0–46.0)
Lymphs Abs: 2.4 10*3/uL (ref 0.7–4.0)
MCHC: 33.2 g/dL (ref 30.0–36.0)
MCV: 92.4 fl (ref 78.0–100.0)
Monocytes Absolute: 0.6 10*3/uL (ref 0.1–1.0)
Monocytes Relative: 10.3 % (ref 3.0–12.0)
Neutro Abs: 2.3 10*3/uL (ref 1.4–7.7)
Neutrophils Relative %: 42.1 % — ABNORMAL LOW (ref 43.0–77.0)
Platelets: 250 10*3/uL (ref 150.0–400.0)
RBC: 4.4 Mil/uL (ref 3.87–5.11)
RDW: 13.4 % (ref 11.5–15.5)
WBC: 5.4 10*3/uL (ref 4.0–10.5)

## 2020-11-30 LAB — TSH: TSH: 1.48 u[IU]/mL (ref 0.35–5.50)

## 2020-11-30 LAB — LIPID PANEL
Cholesterol: 187 mg/dL (ref 0–200)
HDL: 80 mg/dL (ref >39.00)
LDL Cholesterol: 89 mg/dL (ref 0–99)
NonHDL: 107.18
Total CHOL/HDL Ratio: 2
Triglycerides: 91 mg/dL (ref 0.0–149.0)
VLDL: 18.2 mg/dL (ref 0.0–40.0)

## 2020-11-30 LAB — HEMOGLOBIN A1C: Hgb A1c MFr Bld: 5.6 % (ref 4.6–6.5)

## 2020-11-30 MED ORDER — GABAPENTIN 300 MG PO CAPS: 900.0000 mg | ORAL_CAPSULE | Freq: Three times a day (TID) | ORAL | 1 refills | Status: DC

## 2020-11-30 NOTE — Patient Instructions (Signed)
Health Maintenance Due  Topic Date Due   PNEUMOCOCCAL POLYSACCHARIDE VACCINE AGE 62-64 HIGH RISK  Never done   Pneumococcal Vaccine 75-56 Years old (1 - PCV) Never done   HIV Screening  Never done   Zoster Vaccines- Shingrix (1 of 2) Never done   MAMMOGRAM  05/22/2020   COVID-19 Vaccine (2 - Pfizer risk series) 09/13/2020   INFLUENZA VACCINE  11/22/2020    Depression screen PHQ 2/9 11/30/2020 10/28/2019 01/23/2018  Decreased Interest 0 2 3  Down, Depressed, Hopeless '1 2 3  '$ PHQ - 2 Score '1 4 6  '$ Altered sleeping - 2 2  Tired, decreased energy - 2 2  Change in appetite - 3 1  Feeling bad or failure about yourself  - 3 2  Trouble concentrating - 0 3  Moving slowly or fidgety/restless - 0 0  Suicidal thoughts - 1 3  PHQ-9 Score - 15 19  Difficult doing work/chores - Somewhat difficult Very difficult

## 2020-11-30 NOTE — Progress Notes (Signed)
Virtual Visit via Telephone Note  I connected with  [name] on 11/30/20 at  3:15 PM EDT by telephone and verified that I am speaking with the correct person using two identifiers.  Medicare Annual Wellness visit completed telephonically due to Covid-19 pandemic.   Persons participating in this call: This Health Coach and this patient.   Location: Patient: Home Provider: Office   I discussed the limitations, risks, security and privacy concerns of performing an evaluation and management service by telephone and the availability of in person appointments. The patient expressed understanding and agreed to proceed.  Unable to perform video visit due to video visit attempted and failed and/or patient does not have video capability.   Some vital signs may be absent or patient reported.   [name], LPN   Subjective:   NIKOLETTE BYERS is a 60 y.o. female who presents for Medicare Annual (Subsequent) preventive examination.  Review of Systems     Cardiac Risk Factors include: diabetes mellitus;dyslipidemia;obesity (BMI >30kg/m2)     Objective:    Today's Vitals   11/30/20 1452  PainSc: 8    There is no height or weight on file to calculate BMI.  Advanced Directives 11/30/2020 01/20/2019 09/22/2015 08/22/2015 08/19/2014 07/29/2014 07/04/2014  Does Patient Have a Medical Advance Directive? No No No No No No No  Would patient like information on creating a medical advance directive? No - Patient declined No - Patient declined No - patient declined information - No - patient declined information No - patient declined information -  Pre-existing out of facility DNR order (yellow form or pink MOST form) - - - - - - -    Current Medications (verified) Outpatient Encounter Medications as of 11/30/2020  Medication Sig   ALPRAZolam (XANAX) 1 MG tablet TAKE 2 TABLETS BY MOUTH THREE TIMES DAILY AS NEEDED FOR ANXIETY   atorvastatin (LIPITOR) 40 MG tablet Take 1 tablet (40 mg  total) by mouth daily. Patient is as needed follow up with Dr Marlou Porch, further refills will need to be authorized by patients PCP   gabapentin (NEURONTIN) 300 MG capsule Take 3 capsules (900 mg total) by mouth 3 (three) times daily.   levothyroxine (SYNTHROID) 25 MCG tablet Take 1 tablet (25 mcg total) by mouth daily before breakfast.   omega-3 acid ethyl esters (LOVAZA) 1 G capsule Take 1 g by mouth 2 (two) times daily.   omeprazole (PRILOSEC) 20 MG capsule TAKE 1 CAPSULE EVERY DAY   OVER THE COUNTER MEDICATION Fiber Well   QUEtiapine (SEROQUEL) 400 MG tablet Take 800 mg by mouth at bedtime.   venlafaxine XR (EFFEXOR-XR) 75 MG 24 hr capsule TAKE 3 CAPSULES EVERY DAY   PFIZER-BIONT COVID-19 VAC-TRIS SUSP injection    [DISCONTINUED] gabapentin (NEURONTIN) 600 MG tablet Take 1 tablet (600 mg total) by mouth 3 (three) times daily.   [DISCONTINUED] gabapentin (NEURONTIN) 600 MG tablet Take 1 tablet (600 mg total) by mouth 3 (three) times daily.   [DISCONTINUED] ivabradine (CORLANOR) 5 MG TABS tablet Take 3 tablets (15 mg total) by mouth for one dose, 2 hours prior to your Coronary CT.   [DISCONTINUED] traMADol (ULTRAM) 50 MG tablet Take 2 tablets (100 mg total) by mouth every 6 (six) hours as needed for pain. (Patient taking differently: Take 100 mg by mouth every 6 (six) hours as needed. )   No facility-administered encounter medications on file as of 11/30/2020.    Allergies (verified) Codeine, Compazine [prochlorperazine edisylate], and Sulfa antibiotics  History: Past Medical History:  Diagnosis Date   ADHD    Allergy    Anemia    past hx    Anxiety    Barrett esophagus    Breast nodule    benign   Cervical arthritis 09/22/2013   Depression    Elevated liver enzymes    Fatty liver    Fibromyalgia    Fibromyalgia    GERD (gastroesophageal reflux disease)    H/O hiatal hernia    Headache(784.0)    migraines   High cholesterol    History of UTI    Lymphocytic colitis    Migraines     Pain    Panic attack    Paraesophageal hiatal hernia    repaired 2009   Spinal stenosis    Thyroid disease    Thyroid disease    Past Surgical History:  Procedure Laterality Date   ANKLE SURGERY     CHOLECYSTECTOMY  1980s?   COLONOSCOPY  2010   Buccini   EUS N/A 11/19/2013   Procedure: ESOPHAGEAL ENDOSCOPIC ULTRASOUND (EUS) RADIAL;  Surgeon: Arta Silence, MD;  Location: [facility];  Service: Endoscopy;  Laterality: N/A;   LAPAROSCOPIC NISSEN FUNDOPLICATION  0000000   LAPAROSCOPIC PARAESOPHAGEAL HERNIA REPAIR  03/05/2008   Dr Johney Maine   TUBAL LIGATION  1990s?   UPPER GASTROINTESTINAL ENDOSCOPY  04/05/2018   Scarlette Shorts    VAGINAL HYSTERECTOMY  03/04/2001   Family History  Problem Relation Age of Onset   Alcohol abuse Other    Elevated Lipids Other    Heart disease Other    Anxiety disorder Mother    Obesity Mother    High Cholesterol Mother    Thyroid cancer Mother    Depression Mother    Breast cancer Mother    Bone cancer Mother    High Cholesterol Father    Heart disease Father    Alcoholism Father    Colon cancer Maternal Aunt    Colon polyps Sister    Stomach cancer Neg Hx    Esophageal cancer Neg Hx    Rectal cancer Neg Hx    Social History   Socioeconomic History   Marital status: Married    Spouse name: Holiday representative   Number of children: Not on file   Years of education: Not on file   Highest education level: Not on file  Occupational History   Occupation: disabled  Tobacco Use   Smoking status: Never   Smokeless tobacco: Never  Vaping Use   Vaping Use: Never used  Substance and Sexual Activity   Alcohol use: Not Currently    Comment: wine occassionally   Drug use: No   Sexual activity: Yes    Birth control/protection: None    Comment: n/a partial hysterectomy  Other Topics Concern   Not on file  Social History Narrative   Married    Lived in [place].  Moved to [place] in early 2000s   She is on disability for anxiety    Social  Determinants of Health   Financial Resource Strain: Low Risk    Difficulty of Paying Living Expenses: Not hard at all  Food Insecurity: No Food Insecurity   Worried About Charity fundraiser in the Last Year: Never true   Arboriculturist in the Last Year: Never true  Transportation Needs: No Transportation Needs   Lack of Transportation (Medical): No   Lack of Transportation (Non-Medical): No  Physical Activity: Insufficiently Active   Days of Exercise  per Week: 5 days   Minutes of Exercise per Session: 20 min  Stress: Stress Concern Present   Feeling of Stress : To some extent  Social Connections: Moderately Isolated   Frequency of Communication with Friends and Family: More than three times a week   Frequency of Social Gatherings with Friends and Family: Once a week   Attends Religious Services: Never   Marine scientist or Organizations: No   Attends Music therapist: Never   Marital Status: Married    Tobacco Counseling Counseling given: Not Answered   Clinical Intake:  Pre-visit preparation completed: Yes  Pain : 0-10 Pain Score: 8  Pain Type: Chronic pain Pain Location: Generalized Pain Descriptors / Indicators: Aching Pain Onset: More than a month ago Pain Frequency: Constant     BMI - recorded: 34.33 Nutritional Status: BMI > 30  Obese Nutritional Risks: None Diabetes: Yes CBG done?: No Did pt. bring in CBG monitor from home?: No  How often do you need to have someone help you when you read instructions, pamphlets, or other written materials from your doctor or pharmacy?: 1 - Never  Diabetic?Nutrition Risk Assessment:  Has the patient had any N/V/D within the last 2 months?  No  Does the patient have any non-healing wounds?  No  Has the patient had any unintentional weight loss or weight gain?  No   Diabetes:  Is the patient diabetic?  Yes  If diabetic, was a CBG obtained today?  No  Did the patient bring in their glucometer from  home?  No  How often do you monitor your CBG's? N/A.   Financial Strains and Diabetes Management:  Are you having any financial strains with the device, your supplies or your medication? No .  Does the patient want to be seen by Chronic Care Management for management of their diabetes?  No  Would the patient like to be referred to a Nutritionist or for Diabetic Management?  No   Diabetic Exams:  Diabetic Eye Exam: Overdue for diabetic eye exam. Pt has been advised about the importance in completing this exam. Patient advised to call and schedule an eye exam. Diabetic Foot Exam: Overdue, Pt has been advised about the importance in completing this exam. Pt is scheduled for diabetic foot exam on Discontinued.   Interpreter Needed?: No  Information entered by :: Charlott Rakes, LPN   Activities of Daily Living In your present state of health, do you have any difficulty performing the following activities: 11/30/2020  Hearing? N  Vision? N  Difficulty concentrating or making decisions? Y  Walking or climbing stairs? N  Dressing or bathing? N  Doing errands, shopping? N  Preparing Food and eating ? N  Using the Toilet? N  In the past six months, have you accidently leaked urine? Y  Comment at times  Do you have problems with loss of bowel control? N  Managing your Medications? N  Managing your Finances? N  Housekeeping or managing your Housekeeping? N  Some recent data might be hidden    Patient Care Team: [name], NP as PCP - General (Family Medicine) Mayra Neer, MD (Family Medicine) Ronald Lobo, MD as Consulting Physician (Gastroenterology) Barbaraann Rondo, MD as Consulting Physician (Gynecology) Ronald Lobo, MD as Consulting Physician (Gastroenterology) Barbaraann Rondo, MD as Consulting Physician (Gynecology)  Indicate any recent Medical Services you may have received from other than [facility] providers in the past year (date may be approximate).  Assessment:   This is a routine wellness examination for Hilal.  Hearing/Vision screen Hearing Screening - Comments:: Pt denies any hearing issues  Vision Screening - Comments:: Pt follows up with [facility] for annual exams  Dietary issues and exercise activities discussed: Current Exercise Habits: Home exercise routine, Type of exercise: walking, Time (Minutes): 20, Frequency (Times/Week): 5, Weekly Exercise (Minutes/Week): 100   Goals Addressed             This Visit's Progress    lose weight       Patient Stated         Depression Screen PHQ 2/9 Scores 11/30/2020 11/30/2020 10/28/2019 01/23/2018 08/13/2015  PHQ - 2 Score '1 1 4 6 '$ 0  PHQ- 9 Score - - 15 19 -  Exception Documentation - - - Medical reason -    Fall Risk Fall Risk  11/30/2020 10/28/2019 08/13/2015  Falls in the past year? 1 1 Yes  Number falls in past yr: '1 1 2 '$ or more  Injury with Fall? 1 0 No  Comment bruises - -  Risk for fall due to : Impaired balance/gait;Impaired mobility;History of fall(s) - -  Follow up Falls prevention discussed - -    FALL RISK PREVENTION PERTAINING TO THE HOME:  Any stairs in or around the home? No  If so, are there any without handrails? No  Home free of loose throw rugs in walkways, pet beds, electrical cords, etc? Yes  Adequate lighting in your home to reduce risk of falls? Yes   ASSISTIVE DEVICES UTILIZED TO PREVENT FALLS:  Life alert? No  Use of a cane, walker or w/c? No  Grab bars in the bathroom? Yes  Shower chair or bench in shower? No  Elevated toilet seat or a handicapped toilet? No   TIMED UP AND GO:  Was the test performed? No .   Cognitive Function:        Immunizations Immunization History  Administered Date(s) Administered   Influenza,inj,Quad PF,6+ Mos 02/24/2017, 01/31/2018, 01/20/2019, 05/17/2020   Influenza-Unspecified 01/24/2018   Janssen (J&J) SARS-COV-2 Vaccination 07/26/2019   PFIZER(Purple Top)SARS-COV-2 Vaccination 08/23/2020   Tdap  04/05/2017    TDAP status: Up to date  Flu Vaccine status: Due, Education has been provided regarding the importance of this vaccine. Advised may receive this vaccine at local pharmacy or Health Dept. Aware to provide a copy of the vaccination record if obtained from local pharmacy or Health Dept. Verbalized acceptance and understanding.  Pneumococcal vaccine status: Due, Education has been provided regarding the importance of this vaccine. Advised may receive this vaccine at local pharmacy or Health Dept. Aware to provide a copy of the vaccination record if obtained from local pharmacy or Health Dept. Verbalized acceptance and understanding.  Covid-19 vaccine status: Completed vaccines  Qualifies for Shingles Vaccine? Yes   Zostavax completed No   Shingrix Completed?: No.    Education has been provided regarding the importance of this vaccine. Patient has been advised to call insurance company to determine out of pocket expense if they have not yet received this vaccine. Advised may also receive vaccine at local pharmacy or Health Dept. Verbalized acceptance and understanding.  Screening Tests Health Maintenance  Topic Date Due   PNEUMOCOCCAL POLYSACCHARIDE VACCINE AGE 30-64 HIGH RISK  Never done   Pneumococcal Vaccine 19-20 Years old (1 - PCV) Never done   HIV Screening  Never done   Zoster Vaccines- Shingrix (1 of 2) Never done   MAMMOGRAM  05/22/2020  COVID-19 Vaccine (3 - Booster for Janssen series) 10/18/2020   INFLUENZA VACCINE  11/22/2020   COLONOSCOPY (Pts 45-3yr Insurance coverage will need to be confirmed)  04/16/2026   TETANUS/TDAP  04/06/2027   Hepatitis C Screening  Completed   HPV VACCINES  Aged Out   FOOT EXAM  Discontinued   HEMOGLOBIN A1C  Discontinued   OPHTHALMOLOGY EXAM  Discontinued   URINE MICROALBUMIN  Discontinued    Health Maintenance  Health Maintenance Due  Topic Date Due   PNEUMOCOCCAL POLYSACCHARIDE VACCINE AGE 61-64 HIGH RISK  Never done    Pneumococcal Vaccine 046669Years old (1 - PCV) Never done   HIV Screening  Never done   Zoster Vaccines- Shingrix (1 of 2) Never done   MAMMOGRAM  05/22/2020   COVID-19 Vaccine (3 - Booster for Janssen series) 10/18/2020   INFLUENZA VACCINE  11/22/2020    Colorectal cancer screening: Type of screening: Colonoscopy. Completed 04/17/19. Repeat every 7 years  Mammogram status: Completed 05/23/19. Repeat every year      Additional Screening:  Hepatitis C Screening:  Completed 07/08/07  Vision Screening: Recommended annual ophthalmology exams for early detection of glaucoma and other disorders of the eye. Is the patient up to date with their annual eye exam?  Yes  Who is the provider or what is the name of the office in which the patient attends annual eye exams? [facility]  If pt is not established with a provider, would they like to be referred to a provider to establish care? No .   Dental Screening: Recommended annual dental exams for proper oral hygiene  Community Resource Referral / Chronic Care Management: CRR required this visit?  No   CCM required this visit?  No      Plan:     I have personally reviewed and noted the following in the patient's chart:   Medical and social history Use of alcohol, tobacco or illicit drugs  Current medications and supplements including opioid prescriptions.  Functional ability and status Nutritional status Physical activity Advanced directives List of other physicians Hospitalizations, surgeries, and ER visits in previous 12 months Vitals Screenings to include cognitive, depression, and falls Referrals and appointments  In addition, I have reviewed and discussed with patient certain preventive protocols, quality metrics, and best practice recommendations. A written personalized care plan for preventive services as well as general preventive health recommendations were provided to patient.     TWillette Brace  LPN   [id number]  Nurse Notes: Pt stated she will follow up with counselor regina to talk to about family issues which causes her to be some what depressed at times.

## 2020-11-30 NOTE — Patient Instructions (Addendum)
[name] , Thank you for taking time to come for your Medicare Wellness Visit. I appreciate your ongoing commitment to your health goals. Please review the following plan we discussed and let me know if I can assist you in the future.   Screening recommendations/referrals: Colonoscopy: Done 04/17/19 repeat in 7 years due 04/16/26 Mammogram: Done 05/23/19 repeat every year Recommended yearly ophthalmology/optometry visit for glaucoma screening and checkup Recommended yearly dental visit for hygiene and checkup  Vaccinations: Influenza vaccine: Due  Pneumococcal vaccine: Due and discussed Tdap vaccine: Done 04/05/17 repeat every 10 years Due 1213/28 Shingles vaccine: Shingrix discussed. Please contact your pharmacy for coverage information.   Covid-19: Completed 08/23/20  Advanced directives: Advance directive discussed with you today. Even though you declined this today please call our office should you change your mind and we can give you the proper paperwork for you to fill out.   Conditions/risks identified: Lose weight   Next appointment: Follow up in one year for your annual wellness visit.   Preventive Care 40-64 Years, Female Preventive care refers to lifestyle choices and visits with your health care provider that can promote health and wellness. What does preventive care include? A yearly physical exam. This is also called an annual well check. Dental exams once or twice a year. Routine eye exams. Ask your health care provider how often you should have your eyes checked. Personal lifestyle choices, including: Daily care of your teeth and gums. Regular physical activity. Eating a healthy diet. Avoiding tobacco and drug use. Limiting alcohol use. Practicing safe sex. Taking low-dose aspirin daily starting at age 52. Taking vitamin and mineral supplements as recommended by your health care provider. What happens during an annual well check? The services and screenings  done by your health care provider during your annual well check will depend on your age, overall health, lifestyle risk factors, and family history of disease. Counseling  Your health care provider may ask you questions about your: Alcohol use. Tobacco use. Drug use. Emotional well-being. Home and relationship well-being. Sexual activity. Eating habits. Work and work Statistician. Method of birth control. Menstrual cycle. Pregnancy history. Screening  You may have the following tests or measurements: Height, weight, and BMI. Blood pressure. Lipid and cholesterol levels. These may be checked every 5 years, or more frequently if you are over 58 years old. Skin check. Lung cancer screening. You may have this screening every year starting at age 69 if you have a 30-pack-year history of smoking and currently smoke or have quit within the past 15 years. Fecal occult blood test (FOBT) of the stool. You may have this test every year starting at age 86. Flexible sigmoidoscopy or colonoscopy. You may have a sigmoidoscopy every 5 years or a colonoscopy every 10 years starting at age 16. Hepatitis C blood test. Hepatitis B blood test. Sexually transmitted disease (STD) testing. Diabetes screening. This is done by checking your blood sugar (glucose) after you have not eaten for a while (fasting). You may have this done every 1-3 years. Mammogram. This may be done every 1-2 years. Talk to your health care provider about when you should start having regular mammograms. This may depend on whether you have a family history of breast cancer. BRCA-related cancer screening. This may be done if you have a family history of breast, ovarian, tubal, or peritoneal cancers. Pelvic exam and Pap test. This may be done every 3 years starting at age 22. Starting at age 52, this may be done every  5 years if you have a Pap test in combination with an HPV test. Bone density scan. This is done to screen for osteoporosis.  You may have this scan if you are at high risk for osteoporosis. Discuss your test results, treatment options, and if necessary, the need for more tests with your health care provider. Vaccines  Your health care provider may recommend certain vaccines, such as: Influenza vaccine. This is recommended every year. Tetanus, diphtheria, and acellular pertussis (Tdap, Td) vaccine. You may need a Td booster every 10 years. Zoster vaccine. You may need this after age 3. Pneumococcal 13-valent conjugate (PCV13) vaccine. You may need this if you have certain conditions and were not previously vaccinated. Pneumococcal polysaccharide (PPSV23) vaccine. You may need one or two doses if you smoke cigarettes or if you have certain conditions. Talk to your health care provider about which screenings and vaccines you need and how often you need them. This information is not intended to replace advice given to you by your health care provider. Make sure you discuss any questions you have with your health care provider. Document Released: 05/07/2015 Document Revised: 12/29/2015 Document Reviewed: 02/09/2015 Elsevier Interactive Patient Education  2017 [employer] Prevention in the Home Falls can cause injuries. They can happen to people of all ages. There are many things you can do to make your home safe and to help prevent falls. What can I do on the outside of my home? Regularly fix the edges of walkways and driveways and fix any cracks. Remove anything that might make you trip as you walk through a door, such as a raised step or threshold. Trim any bushes or trees on the path to your home. Use bright outdoor lighting. Clear any walking paths of anything that might make someone trip, such as rocks or tools. Regularly check to see if handrails are loose or broken. Make sure that both sides of any steps have handrails. Any raised decks and porches should have guardrails on the edges. Have any  leaves, snow, or ice cleared regularly. Use sand or salt on walking paths during winter. Clean up any spills in your garage right away. This includes oil or grease spills. What can I do in the bathroom? Use night lights. Install grab bars by the toilet and in the tub and shower. Do not use towel bars as grab bars. Use non-skid mats or decals in the tub or shower. If you need to sit down in the shower, use a plastic, non-slip stool. Keep the floor dry. Clean up any water that spills on the floor as soon as it happens. Remove soap buildup in the tub or shower regularly. Attach bath mats securely with double-sided non-slip rug tape. Do not have throw rugs and other things on the floor that can make you trip. What can I do in the bedroom? Use night lights. Make sure that you have a light by your bed that is easy to reach. Do not use any sheets or blankets that are too big for your bed. They should not hang down onto the floor. Have a firm chair that has side arms. You can use this for support while you get dressed. Do not have throw rugs and other things on the floor that can make you trip. What can I do in the kitchen? Clean up any spills right away. Avoid walking on wet floors. Keep items that you use a lot in easy-to-reach places. If you need to  reach something above you, use a strong step stool that has a grab bar. Keep electrical cords out of the way. Do not use floor polish or wax that makes floors slippery. If you must use wax, use non-skid floor wax. Do not have throw rugs and other things on the floor that can make you trip. What can I do with my stairs? Do not leave any items on the stairs. Make sure that there are handrails on both sides of the stairs and use them. Fix handrails that are broken or loose. Make sure that handrails are as long as the stairways. Check any carpeting to make sure that it is firmly attached to the stairs. Fix any carpet that is loose or worn. Avoid  having throw rugs at the top or bottom of the stairs. If you do have throw rugs, attach them to the floor with carpet tape. Make sure that you have a light switch at the top of the stairs and the bottom of the stairs. If you do not have them, ask someone to add them for you. What else can I do to help prevent falls? Wear shoes that: Do not have high heels. Have rubber bottoms. Are comfortable and fit you well. Are closed at the toe. Do not wear sandals. If you use a stepladder: Make sure that it is fully opened. Do not climb a closed stepladder. Make sure that both sides of the stepladder are locked into place. Ask someone to hold it for you, if possible. Clearly mark and make sure that you can see: Any grab bars or handrails. First and last steps. Where the edge of each step is. Use tools that help you move around (mobility aids) if they are needed. These include: Canes. Walkers. Scooters. Crutches. Turn on the lights when you go into a dark area. Replace any light bulbs as soon as they burn out. Set up your furniture so you have a clear path. Avoid moving your furniture around. If any of your floors are uneven, fix them. If there are any pets around you, be aware of where they are. Review your medicines with your doctor. Some medicines can make you feel dizzy. This can increase your chance of falling. Ask your doctor what other things that you can do to help prevent falls. This information is not intended to replace advice given to you by your health care provider. Make sure you discuss any questions you have with your health care provider. Document Released: 02/04/2009 Document Revised: 09/16/2015 Document Reviewed: 05/15/2014 Elsevier Interactive Patient Education  2017 [employer].

## 2020-11-30 NOTE — Progress Notes (Signed)
Subjective:    Patient ID: Leslie Benson, female    DOB: 02-05-1961, 60 y.o.   MRN: [id number]  HPI Patient presents for yearly preventative medicine examination. She is a pleasant 60 year old female who  has a past medical history of ADHD, Allergy, Anemia, Anxiety, Barrett esophagus, Breast nodule, Cervical arthritis (09/22/2013), Depression, Elevated liver enzymes, Fatty liver, Fibromyalgia, Fibromyalgia, GERD (gastroesophageal reflux disease), H/O hiatal hernia, Headache(784.0), High cholesterol, History of UTI, Lymphocytic colitis, Migraines, Pain, Panic attack, Paraesophageal hiatal hernia, Spinal stenosis, Thyroid disease, and Thyroid disease.  Hypothyroidism - takes Synthroid 25 mcg daily  Lab Results  Component Value Date   TSH 1.44 10/28/2019   Hyperlipidemia - takes Lipitor 40 mg daily and Lovaza 1 gm BID. Denies myalgia or fatigue - prescribed by Cardiology  Lab Results  Component Value Date   CHOL 171 01/06/2020   HDL 86 01/06/2020   LDLCALC 70 01/06/2020   LDLDIRECT 168.0 04/05/2017   TRIG 79 01/06/2020   CHOLHDL 2.0 01/06/2020   Anxiety/Depression -followed by psychiatry.  Currently prescribed Effexor, xanax, and Seroquel. She reports that she continues to have anxiety attacks due to her daughters not talking to her and she not being able to see her grandchildren  Fibromyalgia- takes gabapentin 600 mg TID. Continues to have significant discomfort in most joints.   All immunizations and health maintenance protocols were reviewed with the patient and needed orders were placed.  Appropriate screening laboratory values were ordered for the patient including screening of hyperlipidemia, renal function and hepatic function.  Medication reconciliation,  past medical history, social history, problem list and allergies were reviewed in detail with the patient  Goals were established with regard to weight loss, exercise, and  diet in compliance with medications. She is not  doing much in the way of exercise. She is trying to eat a healthy diet, more fruits and vegetables   Wt Readings from Last 3 Encounters:  11/30/20 200 lb (90.7 kg)  01/22/20 199 lb (90.3 kg)  01/14/20 199 lb 6.4 oz (90.4 kg)    Review of Systems  Constitutional:  Positive for fatigue.  HENT: Negative.    Eyes: Negative.   Respiratory: Negative.    Cardiovascular: Negative.   Gastrointestinal: Negative.   Endocrine: Negative.   Genitourinary: Negative.   Musculoskeletal:  Positive for arthralgias and myalgias.  Skin: Negative.   Allergic/Immunologic: Negative.   Neurological: Negative.   Hematological: Negative.   Psychiatric/Behavioral: Negative.    Past Medical History:  Diagnosis Date   ADHD    Allergy    Anemia    past hx    Anxiety    Barrett esophagus    Breast nodule    benign   Cervical arthritis 09/22/2013   Depression    Elevated liver enzymes    Fatty liver    Fibromyalgia    Fibromyalgia    GERD (gastroesophageal reflux disease)    H/O hiatal hernia    Headache(784.0)    migraines   High cholesterol    History of UTI    Lymphocytic colitis    Migraines    Pain    Panic attack    Paraesophageal hiatal hernia    repaired 2009   Spinal stenosis    Thyroid disease    Thyroid disease     Social History   Socioeconomic History   Marital status: Married    Spouse name: Abe People   Number of children: Not on file   Years of education:  Not on file   Highest education level: Not on file  Occupational History   Occupation: disabled  Tobacco Use   Smoking status: Never   Smokeless tobacco: Never  Vaping Use   Vaping Use: Never used  Substance and Sexual Activity   Alcohol use: Yes    Alcohol/week: 0.0 standard drinks    Comment: wine occassionally   Drug use: No   Sexual activity: Yes    Birth control/protection: None    Comment: n/a partial hysterectomy  Other Topics Concern   Not on file  Social History Narrative   Married    Lived in  [place].  Moved to [place] in early 2000s   She is on disability for anxiety    Social Determinants of Health   Financial Resource Strain: Not on file  Food Insecurity: Not on file  Transportation Needs: Not on file  Physical Activity: Not on file  Stress: Not on file  Social Connections: Not on file  Intimate Partner Violence: Not on file    Past Surgical History:  Procedure Laterality Date   ANKLE SURGERY     CHOLECYSTECTOMY  1980s?   COLONOSCOPY  2010   Buccini   EUS N/A 11/19/2013   Procedure: ESOPHAGEAL ENDOSCOPIC ULTRASOUND (EUS) RADIAL;  Surgeon: Arta Silence, MD;  Location: [facility];  Service: Endoscopy;  Laterality: N/A;   LAPAROSCOPIC NISSEN FUNDOPLICATION  0000000   LAPAROSCOPIC PARAESOPHAGEAL HERNIA REPAIR  03/05/2008   Dr Johney Maine   TUBAL LIGATION  1990s?   UPPER GASTROINTESTINAL ENDOSCOPY  04/05/2018   Scarlette Shorts    VAGINAL HYSTERECTOMY  03/04/2001    Family History  Problem Relation Age of Onset   Alcohol abuse Other    Elevated Lipids Other    Heart disease Other    Anxiety disorder Mother    Obesity Mother    High Cholesterol Mother    Thyroid cancer Mother    Depression Mother    Breast cancer Mother    Bone cancer Mother    High Cholesterol Father    Heart disease Father    Alcoholism Father    Colon cancer Maternal Aunt    Colon polyps Sister    Stomach cancer Neg Hx    Esophageal cancer Neg Hx    Rectal cancer Neg Hx     Allergies  Allergen Reactions   Codeine Nausea And Vomiting   Compazine [Prochlorperazine Edisylate]     "feels weird"   Sulfa Antibiotics     Unknown reaction.    Current Outpatient Medications on File Prior to Visit  Medication Sig Dispense Refill   ALPRAZolam (XANAX) 1 MG tablet TAKE 2 TABLETS BY MOUTH THREE TIMES DAILY AS NEEDED FOR ANXIETY 180 tablet 0   atorvastatin (LIPITOR) 40 MG tablet Take 1 tablet (40 mg total) by mouth daily. Patient is as needed follow up with Dr Marlou Porch, further refills will need  to be authorized by patients PCP 30 tablet 0   gabapentin (NEURONTIN) 600 MG tablet Take 1 tablet (600 mg total) by mouth 3 (three) times daily. 270 tablet 1   gabapentin (NEURONTIN) 600 MG tablet Take 1 tablet (600 mg total) by mouth 3 (three) times daily. 90 tablet 0   ivabradine (CORLANOR) 5 MG TABS tablet Take 3 tablets (15 mg total) by mouth for one dose, 2 hours prior to your Coronary CT. 3 tablet 0   levothyroxine (SYNTHROID) 25 MCG tablet Take 1 tablet (25 mcg total) by mouth daily before breakfast. 90 tablet 3  omega-3 acid ethyl esters (LOVAZA) 1 G capsule Take 1 g by mouth 2 (two) times daily.     omeprazole (PRILOSEC) 20 MG capsule TAKE 1 CAPSULE EVERY DAY 90 capsule 3   OVER THE COUNTER MEDICATION Fiber Well     QUEtiapine (SEROQUEL) 400 MG tablet Take 800 mg by mouth at bedtime.     traMADol (ULTRAM) 50 MG tablet Take 2 tablets (100 mg total) by mouth every 6 (six) hours as needed for pain. (Patient taking differently: Take 100 mg by mouth every 6 (six) hours as needed. ) 30 tablet 0   venlafaxine XR (EFFEXOR-XR) 75 MG 24 hr capsule TAKE 3 CAPSULES EVERY DAY 270 capsule 0   No current facility-administered medications on file prior to visit.    BP 110/60   Pulse (!) 106   Temp 99.1 F (37.3 C) (Oral)   Ht '5\' 4"'$  (1.626 m)   Wt 200 lb (90.7 kg)   SpO2 94%   BMI 34.33 kg/m       Objective:   Physical Exam Vitals and nursing note reviewed.  Constitutional:      General: She is not in acute distress.    Appearance: Normal appearance. She is well-developed. She is not ill-appearing.  HENT:     Head: Normocephalic and atraumatic.     Right Ear: Tympanic membrane, ear canal and external ear normal. There is no impacted cerumen.     Left Ear: Tympanic membrane, ear canal and external ear normal. There is no impacted cerumen.     Nose: Nose normal. No congestion or rhinorrhea.     Mouth/Throat:     Mouth: Mucous membranes are moist.     Pharynx: Oropharynx is clear. No  oropharyngeal exudate or posterior oropharyngeal erythema.  Eyes:     General:        Right eye: No discharge.        Left eye: No discharge.     Extraocular Movements: Extraocular movements intact.     Conjunctiva/sclera: Conjunctivae normal.     Pupils: Pupils are equal, round, and reactive to light.  Neck:     Thyroid: No thyromegaly.     Vascular: No carotid bruit.     Trachea: No tracheal deviation.  Cardiovascular:     Rate and Rhythm: Normal rate and regular rhythm.     Pulses: Normal pulses.     Heart sounds: Normal heart sounds. No murmur heard.   No friction rub. No gallop.  Pulmonary:     Effort: Pulmonary effort is normal. No respiratory distress.     Breath sounds: Normal breath sounds. No stridor. No wheezing, rhonchi or rales.  Chest:     Chest wall: No tenderness.  Abdominal:     General: Abdomen is flat. Bowel sounds are normal. There is no distension.     Palpations: Abdomen is soft. There is no mass.     Tenderness: There is no abdominal tenderness. There is no right CVA tenderness, left CVA tenderness, guarding or rebound.     Hernia: No hernia is present.  Musculoskeletal:        General: Tenderness (througout most of her joints with light palpation) present. No swelling, deformity or signs of injury. Normal range of motion.     Cervical back: Normal range of motion and neck supple.     Right lower leg: No edema.     Left lower leg: No edema.  Lymphadenopathy:     Cervical: No cervical adenopathy.  Skin:    General: Skin is warm and dry.     Coloration: Skin is not jaundiced or pale.     Findings: No bruising, erythema, lesion or rash.  Neurological:     General: No focal deficit present.     Mental Status: She is alert and oriented to person, place, and time.     Cranial Nerves: No cranial nerve deficit.     Sensory: No sensory deficit.     Motor: No weakness.     Coordination: Coordination normal.     Gait: Gait normal.     Deep Tendon Reflexes:  Reflexes normal.  Psychiatric:        Mood and Affect: Mood normal.        Behavior: Behavior normal.        Thought Content: Thought content normal.        Judgment: Judgment normal.       Assessment & Plan:   1. Routine general medical examination at a health care facility - needs to exercise more - Follow up in one year or sooner if needed - CBC with Differential/Platelet; Future - Comprehensive metabolic panel; Future - Hemoglobin A1c; Future - Lipid panel; Future - TSH; Future   2. Fibromyalgia - Refused rheumatoogy referral. Educated on the importance if exercise to help with fibromyalgia pain.  - Will increase gabapentin to 900 mg TID  - CBC with Differential/Platelet; Future - Comprehensive metabolic panel; Future - Hemoglobin A1c; Future - Lipid panel; Future - TSH; Future - gabapentin (NEURONTIN) 300 MG capsule; Take 3 capsules (900 mg total) by mouth 3 (three) times daily.  Dispense: 810 capsule; Refill: 1  3. Other specified hypothyroidism - Consider increasing synthroid  - CBC with Differential/Platelet; Future - Comprehensive metabolic panel; Future - Hemoglobin A1c; Future - Lipid panel; Future - TSH; Future  4. Anxiety and depression - Follow up with Psychiatry   5. Other hyperlipidemia - Follow up with cardiology  - CBC with Differential/Platelet; Future - Comprehensive metabolic panel; Future - Hemoglobin A1c; Future - Lipid panel; Future - TSH; Future   [name], NP

## 2020-12-01 ENCOUNTER — Other Ambulatory Visit: Payer: Self-pay

## 2020-12-01 ENCOUNTER — Emergency Department (HOSPITAL_COMMUNITY): Payer: Medicare HMO

## 2020-12-01 ENCOUNTER — Inpatient Hospital Stay (HOSPITAL_COMMUNITY)
Admission: EM | Admit: 2020-12-01 | Discharge: 2020-12-04 | DRG: 312 | Disposition: A | Discharge status: Home / Self Care | Payer: Medicare HMO | Attending: Internal Medicine | Admitting: Internal Medicine

## 2020-12-01 ENCOUNTER — Encounter (HOSPITAL_COMMUNITY): Payer: Self-pay | Admitting: Emergency Medicine

## 2020-12-01 ENCOUNTER — Telehealth: Payer: Self-pay | Admitting: Adult Health

## 2020-12-01 DIAGNOSIS — Z6372 Alcoholism and drug addiction in family: Secondary | ICD-10-CM

## 2020-12-01 DIAGNOSIS — Z818 Family history of other mental and behavioral disorders: Secondary | ICD-10-CM

## 2020-12-01 DIAGNOSIS — Z8249 Family history of ischemic heart disease and other diseases of the circulatory system: Secondary | ICD-10-CM

## 2020-12-01 DIAGNOSIS — Z885 Allergy status to narcotic agent status: Secondary | ICD-10-CM

## 2020-12-01 DIAGNOSIS — R531 Weakness: Secondary | ICD-10-CM | POA: Diagnosis present

## 2020-12-01 DIAGNOSIS — E039 Hypothyroidism, unspecified: Secondary | ICD-10-CM | POA: Diagnosis not present

## 2020-12-01 DIAGNOSIS — F419 Anxiety disorder, unspecified: Secondary | ICD-10-CM | POA: Diagnosis not present

## 2020-12-01 DIAGNOSIS — Z9049 Acquired absence of other specified parts of digestive tract: Secondary | ICD-10-CM | POA: Diagnosis not present

## 2020-12-01 DIAGNOSIS — Z79899 Other long term (current) drug therapy: Secondary | ICD-10-CM

## 2020-12-01 DIAGNOSIS — Z882 Allergy status to sulfonamides status: Secondary | ICD-10-CM | POA: Diagnosis not present

## 2020-12-01 DIAGNOSIS — F410 Panic disorder [episodic paroxysmal anxiety] without agoraphobia: Secondary | ICD-10-CM

## 2020-12-01 DIAGNOSIS — Z83438 Family history of other disorder of lipoprotein metabolism and other lipidemia: Secondary | ICD-10-CM | POA: Diagnosis not present

## 2020-12-01 DIAGNOSIS — M797 Fibromyalgia: Secondary | ICD-10-CM | POA: Diagnosis present

## 2020-12-01 DIAGNOSIS — R42 Dizziness and giddiness: Principal | ICD-10-CM

## 2020-12-01 DIAGNOSIS — Z6830 Body mass index (BMI) 30.0-30.9, adult: Secondary | ICD-10-CM | POA: Diagnosis not present

## 2020-12-01 DIAGNOSIS — F909 Attention-deficit hyperactivity disorder, unspecified type: Secondary | ICD-10-CM | POA: Diagnosis present

## 2020-12-01 DIAGNOSIS — E669 Obesity, unspecified: Secondary | ICD-10-CM | POA: Diagnosis present

## 2020-12-01 DIAGNOSIS — Z888 Allergy status to other drugs, medicaments and biological substances status: Secondary | ICD-10-CM | POA: Diagnosis not present

## 2020-12-01 DIAGNOSIS — I951 Orthostatic hypotension: Principal | ICD-10-CM | POA: Diagnosis present

## 2020-12-01 DIAGNOSIS — R29898 Other symptoms and signs involving the musculoskeletal system: Secondary | ICD-10-CM | POA: Diagnosis not present

## 2020-12-01 DIAGNOSIS — Z8371 Family history of colonic polyps: Secondary | ICD-10-CM

## 2020-12-01 DIAGNOSIS — Z803 Family history of malignant neoplasm of breast: Secondary | ICD-10-CM

## 2020-12-01 DIAGNOSIS — I726 Aneurysm of vertebral artery: Secondary | ICD-10-CM | POA: Diagnosis not present

## 2020-12-01 DIAGNOSIS — Z20822 Contact with and (suspected) exposure to covid-19: Secondary | ICD-10-CM | POA: Diagnosis present

## 2020-12-01 DIAGNOSIS — Z800 Family history of malignant neoplasm of digestive organs: Secondary | ICD-10-CM

## 2020-12-01 DIAGNOSIS — Z808 Family history of malignant neoplasm of other organs or systems: Secondary | ICD-10-CM

## 2020-12-01 DIAGNOSIS — K219 Gastro-esophageal reflux disease without esophagitis: Secondary | ICD-10-CM | POA: Diagnosis present

## 2020-12-01 DIAGNOSIS — G43909 Migraine, unspecified, not intractable, without status migrainosus: Secondary | ICD-10-CM | POA: Diagnosis present

## 2020-12-01 DIAGNOSIS — E7800 Pure hypercholesterolemia, unspecified: Secondary | ICD-10-CM | POA: Diagnosis present

## 2020-12-01 DIAGNOSIS — F32A Depression, unspecified: Secondary | ICD-10-CM | POA: Diagnosis present

## 2020-12-01 DIAGNOSIS — Z79890 Hormone replacement therapy (postmenopausal): Secondary | ICD-10-CM

## 2020-12-01 DIAGNOSIS — Z811 Family history of alcohol abuse and dependence: Secondary | ICD-10-CM

## 2020-12-01 DIAGNOSIS — G4700 Insomnia, unspecified: Secondary | ICD-10-CM

## 2020-12-01 DIAGNOSIS — E7849 Other hyperlipidemia: Secondary | ICD-10-CM | POA: Diagnosis present

## 2020-12-01 LAB — DIFFERENTIAL
Abs Immature Granulocytes: 0.01 10*3/uL (ref 0.00–0.07)
Basophils Absolute: 0.1 10*3/uL (ref 0.0–0.1)
Basophils Relative: 1 %
Eosinophils Absolute: 0.1 10*3/uL (ref 0.0–0.5)
Eosinophils Relative: 2 %
Immature Granulocytes: 0 %
Lymphocytes Relative: 51 %
Lymphs Abs: 3.8 10*3/uL (ref 0.7–4.0)
Monocytes Absolute: 0.8 10*3/uL (ref 0.1–1.0)
Monocytes Relative: 11 %
Neutro Abs: 2.5 10*3/uL (ref 1.7–7.7)
Neutrophils Relative %: 35 %

## 2020-12-01 LAB — PROTIME-INR
INR: 0.9 (ref 0.8–1.2)
Prothrombin Time: 12 seconds (ref 11.4–15.2)

## 2020-12-01 LAB — COMPREHENSIVE METABOLIC PANEL
ALT: 44 U/L (ref 0–44)
AST: 43 U/L — ABNORMAL HIGH (ref 15–41)
Albumin: 4.3 g/dL (ref 3.5–5.0)
Alkaline Phosphatase: 105 U/L (ref 38–126)
Anion gap: 11 (ref 5–15)
BUN: 17 mg/dL (ref 8–23)
CO2: 24 mmol/L (ref 22–32)
Calcium: 9.1 mg/dL (ref 8.9–10.3)
Chloride: 102 mmol/L (ref 98–111)
Creatinine, Ser: 0.89 mg/dL (ref 0.44–1.00)
GFR, Estimated: >60 mL/min (ref >60)
Glucose, Bld: 116 mg/dL — ABNORMAL HIGH (ref 70–99)
Potassium: 4.2 mmol/L (ref 3.5–5.1)
Sodium: 137 mmol/L (ref 135–145)
Total Bilirubin: 0.4 mg/dL (ref 0.3–1.2)
Total Protein: 6.9 g/dL (ref 6.5–8.1)

## 2020-12-01 LAB — CBC
HCT: 40.2 % (ref 36.0–46.0)
Hemoglobin: 12.8 g/dL (ref 12.0–15.0)
MCH: 30.5 pg (ref 26.0–34.0)
MCHC: 31.8 g/dL (ref 30.0–36.0)
MCV: 95.7 fL (ref 80.0–100.0)
Platelets: 217 10*3/uL (ref 150–400)
RBC: 4.2 MIL/uL (ref 3.87–5.11)
RDW: 13.2 % (ref 11.5–15.5)
WBC: 7.3 10*3/uL (ref 4.0–10.5)
nRBC: 0 % (ref 0.0–0.2)

## 2020-12-01 LAB — CBG MONITORING, ED: Glucose-Capillary: 110 mg/dL — ABNORMAL HIGH (ref 70–99)

## 2020-12-01 LAB — I-STAT CHEM 8, ED
BUN: 19 mg/dL (ref 8–23)
Calcium, Ion: 1.11 mmol/L — ABNORMAL LOW (ref 1.15–1.40)
Chloride: 101 mmol/L (ref 98–111)
Creatinine, Ser: 0.8 mg/dL (ref 0.44–1.00)
Glucose, Bld: 120 mg/dL — ABNORMAL HIGH (ref 70–99)
HCT: 37 % (ref 36.0–46.0)
Hemoglobin: 12.6 g/dL (ref 12.0–15.0)
Potassium: 4.5 mmol/L (ref 3.5–5.1)
Sodium: 138 mmol/L (ref 135–145)
TCO2: 29 mmol/L (ref 22–32)

## 2020-12-01 LAB — RESP PANEL BY RT-PCR (FLU A&B, COVID) ARPGX2
Influenza A by PCR: NEGATIVE
Influenza B by PCR: NEGATIVE
SARS Coronavirus 2 by RT PCR: NEGATIVE

## 2020-12-01 LAB — ETHANOL: Alcohol, Ethyl (B): <10 mg/dL (ref <10)

## 2020-12-01 LAB — APTT: aPTT: 30 seconds (ref 24–36)

## 2020-12-01 NOTE — Telephone Encounter (Signed)
Pt called and needs a refill on her xanax to be sent to the [facility] on [address]. She needs this medicine by Friday. She is going out of town

## 2020-12-01 NOTE — ED Provider Notes (Signed)
Emergency Medicine Provider Triage Evaluation Note  Leslie Benson , a 60 y.o. female  was evaluated in triage.  Pt complains of sudden onset of dizziness and right-sided weakness that started 30 minutes prior to arrival.  Patient states she was laying in bed and then "felt weird".  Patient endorses right-sided weakness.  No changes to vision.  No changes to speech.  No facial droop.  Review of Systems  Positive: Weakness, dizziness Negative: fever  Physical Exam  There were no vitals taken for this visit. Gen:   Awake, no distress   Resp:  Normal effort  MSK:   Moves extremities without difficulty  Other:  Patient leaning towards the right, right arm drift, no facial droop  Medical Decision Making  Medically screening exam initiated at 10:02 PM.  Appropriate orders placed.  [name] was informed that the remainder of the evaluation will be completed by another provider, this initial triage assessment does not replace that evaluation, and the importance of remaining in the ED until their evaluation is complete.  Code stroke initiated in triage   Karie Kirks 12/01/20 2203    Regan Lemming, MD 12/02/20 [id number]

## 2020-12-01 NOTE — ED Notes (Signed)
Pt to CT via stretcher

## 2020-12-01 NOTE — ED Triage Notes (Signed)
Pt states she woke up from sleeping and was dizzy with left sided weakness. Pt is not able to stand on her own at this time. Pt has left sided drift to her arms.

## 2020-12-01 NOTE — ED Provider Notes (Signed)
[facility] DEPT Provider Note   CSN: [id number] Arrival date & time: 12/01/20  2159     History Chief Complaint  Patient presents with   Code Stroke    Leslie Benson is a 60 y.o. female.  HPI Presents for evaluation dizziness which started suddenly about 30 minutes ago while she was watching TV.  She states that she stood up to go to the bathroom, felt dizzy and had to sit back down.  She describes the dizziness as a feeling of being off balance.  Came here by private vehicle for evaluation.  She states that when she walks a lot she gets dizzy.  She denies headache, focal weakness or numbness.  No similar problems in the past.  She recalls watching TV prior to onset of the dizziness.  There are no other known active modifying factors.    Past Medical History:  Diagnosis Date   ADHD    Allergy    Anemia    past hx    Anxiety    Barrett esophagus    Breast nodule    benign   Cervical arthritis 09/22/2013   Depression    Elevated liver enzymes    Fatty liver    Fibromyalgia    Fibromyalgia    GERD (gastroesophageal reflux disease)    H/O hiatal hernia    Headache(784.0)    migraines   High cholesterol    History of UTI    Lymphocytic colitis    Migraines    Pain    Panic attack    Paraesophageal hiatal hernia    repaired 2009   Spinal stenosis    Thyroid disease    Thyroid disease     Patient Active Problem List   Diagnosis Date Noted   Hyperlipidemia associated with type 2 diabetes mellitus ([place]) 07/03/2018   Other hyperlipidemia 01/23/2018   Class 1 obesity with serious comorbidity and body mass index (BMI) of 30.0 to 30.9 in adult 01/23/2018   Hypothyroidism 11/25/2014   Adynamic ileus ([place]) 09/22/2013   Obesity (BMI 30-39.9) 09/22/2013   Abdominal pain, epigastric 09/22/2013   Transaminitis 09/22/2013   Cervical arthritis 09/22/2013   Chronic constipation 09/22/2013   Fibromyalgia     Past Surgical History:   Procedure Laterality Date   ANKLE SURGERY     CHOLECYSTECTOMY  1980s?   COLONOSCOPY  2010   Buccini   EUS N/A 11/19/2013   Procedure: ESOPHAGEAL ENDOSCOPIC ULTRASOUND (EUS) RADIAL;  Surgeon: Arta Silence, MD;  Location: [facility] ENDOSCOPY;  Service: Endoscopy;  Laterality: N/A;   LAPAROSCOPIC NISSEN FUNDOPLICATION  0000000   LAPAROSCOPIC PARAESOPHAGEAL HERNIA REPAIR  03/05/2008   Dr Johney Maine   TUBAL LIGATION  1990s?   UPPER GASTROINTESTINAL ENDOSCOPY  04/05/2018   Scarlette Shorts    VAGINAL HYSTERECTOMY  03/04/2001     OB History     Gravida  4   Para  4   Term      Preterm      AB      Living         SAB      IAB      Ectopic      Multiple      Live Births              Family History  Problem Relation Age of Onset   Alcohol abuse Other    Elevated Lipids Other    Heart disease Other    Anxiety disorder Mother  Obesity Mother    High Cholesterol Mother    Thyroid cancer Mother    Depression Mother    Breast cancer Mother    Bone cancer Mother    High Cholesterol Father    Heart disease Father    Alcoholism Father    Colon cancer Maternal Aunt    Colon polyps Sister    Stomach cancer Neg Hx    Esophageal cancer Neg Hx    Rectal cancer Neg Hx     Social History   Tobacco Use   Smoking status: Never   Smokeless tobacco: Never  Vaping Use   Vaping Use: Never used  Substance Use Topics   Alcohol use: Not Currently    Comment: wine occassionally   Drug use: No    Home Medications Prior to Admission medications   Medication Sig Start Date End Date Taking? Authorizing Provider  ALPRAZolam Duanne Moron) 1 MG tablet TAKE 2 TABLETS BY MOUTH THREE TIMES DAILY AS NEEDED FOR ANXIETY 11/04/20   [name], [name], NP  atorvastatin (LIPITOR) 40 MG tablet Take 1 tablet (40 mg total) by mouth daily. Patient is as needed follow up with Dr Marlou Porch, further refills will need to be authorized by patients PCP 09/28/20   Jerline Pain, MD  gabapentin (NEURONTIN)  300 MG capsule Take 3 capsules (900 mg total) by mouth 3 (three) times daily. 11/30/20 02/28/21  Nafziger, Tommi Rumps, NP  levothyroxine (SYNTHROID) 25 MCG tablet Take 1 tablet (25 mcg total) by mouth daily before breakfast. 04/02/20   Nafziger, Tommi Rumps, NP  omega-3 acid ethyl esters (LOVAZA) 1 G capsule Take 1 g by mouth 2 (two) times daily.    [provider]  omeprazole (PRILOSEC) 20 MG capsule TAKE 1 CAPSULE EVERY DAY 07/08/20   Irene Shipper, MD  OVER THE COUNTER MEDICATION [employer] Well    [provider]  PFIZER-BIONT COVID-19 VAC-TRIS SUSP injection  08/23/20   [provider]  QUEtiapine (SEROQUEL) 400 MG tablet Take 800 mg by mouth at bedtime.    [provider]  venlafaxine XR (EFFEXOR-XR) 75 MG 24 hr capsule TAKE 3 CAPSULES EVERY DAY 11/12/20   [name], [name], NP    Allergies    Codeine, Compazine [prochlorperazine edisylate], and Sulfa antibiotics  Review of Systems   Review of Systems  All other systems reviewed and are negative.  Physical Exam Updated Vital Signs BP 111/81   Pulse 85   Temp 98 F (36.7 C) (Oral)   Resp 12   Ht '5\' 4"'$  (1.626 m)   Wt 90.7 kg   SpO2 92%   BMI 34.32 kg/m   Physical Exam Vitals and nursing note reviewed.  Constitutional:      Appearance: She is well-developed. She is not ill-appearing.  HENT:     Head: Normocephalic and atraumatic.     Right Ear: External ear normal.     Left Ear: External ear normal.     Nose: No congestion.     Mouth/Throat:     Mouth: Mucous membranes are moist.  Eyes:     Conjunctiva/sclera: Conjunctivae normal.     Pupils: Pupils are equal, round, and reactive to light.  Neck:     Trachea: Phonation normal.  Cardiovascular:     Rate and Rhythm: Normal rate and regular rhythm.     Heart sounds: Normal heart sounds.  Pulmonary:     Effort: Pulmonary effort is normal.     Breath sounds: Normal breath sounds.  Abdominal:  Palpations: Abdomen is soft.     Tenderness: There  is no abdominal tenderness.  Musculoskeletal:        General: Normal range of motion.     Cervical back: Normal range of motion and neck supple.  Skin:    General: Skin is warm and dry.  Neurological:     Mental Status: She is alert and oriented to person, place, and time.     Cranial Nerves: No cranial nerve deficit.     Sensory: No sensory deficit.     Motor: No abnormal muscle tone.     Coordination: Coordination normal.     Comments: No dysarthria aphasia or nystagmus.  No pronator drift.  No ataxia.  She is tremulous.  Alert and responsive.  Psychiatric:        Mood and Affect: Mood normal.        Behavior: Behavior normal.        Thought Content: Thought content normal.        Judgment: Judgment normal.    ED Results / Procedures / Treatments   Labs (all labs ordered are listed, but only abnormal results are displayed) Labs Reviewed  COMPREHENSIVE METABOLIC PANEL - Abnormal; Notable for the following components:      Result Value   Glucose, Bld 116 (*)    AST 43 (*)    All other components within normal limits  I-STAT CHEM 8, ED - Abnormal; Notable for the following components:   Glucose, Bld 120 (*)    Calcium, Ion 1.11 (*)    All other components within normal limits  CBG MONITORING, ED - Abnormal; Notable for the following components:   Glucose-Capillary 110 (*)    All other components within normal limits  RESP PANEL BY RT-PCR (FLU A&B, COVID) ARPGX2  ETHANOL  PROTIME-INR  APTT  CBC  DIFFERENTIAL  RAPID URINE DRUG SCREEN, HOSP PERFORMED  URINALYSIS, ROUTINE W REFLEX MICROSCOPIC    EKG EKG Interpretation  Date/Time:  Wednesday December 01 2020 22:14:29 EDT Ventricular Rate:  85 PR Interval:  175 QRS Duration: 128 QT Interval:  406 QTC Calculation: 483 R Axis:   76 Text Interpretation: Sinus rhythm IVCD, consider atypical RBBB since last tracing no significant change Confirmed by Daleen Bo [phone]) on 12/01/2020 11:29:58 PM  Radiology CT HEAD WO  CONTRAST (5MM)  Result Date: 12/01/2020 CLINICAL DATA:  Dizziness, left-sided weakness EXAM: CT HEAD WITHOUT CONTRAST TECHNIQUE: Contiguous axial images were obtained from the base of the skull through the vertex without intravenous contrast. COMPARISON:  09/29/2009 FINDINGS: Brain: No evidence of acute infarction, hemorrhage, hydrocephalus, extra-axial collection or mass lesion/mass effect. Vascular: No hyperdense vessel or unexpected calcification. Skull: Normal. Negative for fracture or focal lesion. Sinuses/Orbits: The visualized paranasal sinuses are essentially clear. The mastoid air cells are unopacified. Other: None. IMPRESSION: Normal head CT. Electronically Signed   By: Julian Hy M.D.   On: 12/01/2020 22:43    Procedures .Critical Care  Date/Time: 12/02/2020 12:46 AM Performed by: Daleen Bo, MD Authorized by: Daleen Bo, MD   Critical care provider statement:    Critical care time (minutes):  35   Critical care start time:  12/01/2020 10:09 PM   Critical care end time:  12/02/2020 12:46 AM   Critical care time was exclusive of:  Separately billable procedures and treating other patients   Critical care was necessary to treat or prevent imminent or life-threatening deterioration of the following conditions:  CNS failure or compromise   Critical care was  time spent personally by me on the following activities:  Blood draw for specimens, development of treatment plan with patient or surrogate, discussions with consultants, evaluation of patient's response to treatment, examination of patient, obtaining history from patient or surrogate, ordering and performing treatments and interventions, ordering and review of laboratory studies, pulse oximetry, re-evaluation of patient's condition, review of old charts and ordering and review of radiographic studies   Medications Ordered in ED Medications  LORazepam (ATIVAN) injection 0.5 mg (has no administration in time range)    ED  Course  I have reviewed the triage vital signs and the nursing notes.  Pertinent labs & imaging results that were available during my care of the patient were reviewed by me and considered in my medical decision making (see chart for details).  Clinical Course as of 12/02/20 0046  Wed Dec 01, 2020  2325 Patient is calm at this time without tremulousness.  She states she has a mild headache at this time.  She remains alert and cooperative.  No dysarthria or aphasia.  No nystagmus at this time. [EW]  2348 She remains calm, and appears comfortable in bed.  She states she took all of her medications today.  We will try to check orthostatics and ambulate her. [EW]  2348 Orthostatics negative.  Relation trial with her standing failed because she fell to the left, and backwards according to the tech.  Patient did not fall to the ground.  She was assisted back to the bed.  At this time she is alert and complains of pain in her back and mild dizziness.  Will give half milligram Ativan and reassess.  Plan on neurology consult with consideration for possible MRI imaging. [EW]  Thu Dec 02, 2020  0029 Consult teleneurology, [facility]. [EW]    Clinical Course User Index [EW] Daleen Bo, MD   MDM Rules/Calculators/A&P                            Patient Vitals for the past 24 hrs:  BP Temp Temp src Pulse Resp SpO2 Height Weight  12/02/20 0020 111/81 -- -- 85 12 92 % -- --  12/02/20 0000 120/83 -- -- 74 12 94 % -- --  12/01/20 2330 115/79 -- -- 75 15 91 % -- --  12/01/20 2300 118/75 -- -- 76 14 93 % -- --  12/01/20 2250 118/82 -- -- 82 17 94 % -- --  12/01/20 2220 114/84 -- -- 83 15 92 % -- --  12/01/20 2208 -- -- -- -- -- -- '5\' 4"'$  (1.626 m) 90.7 kg  12/01/20 2202 121/89 98 F (36.7 C) Oral 95 15 91 % -- --    12:31 AM Reevaluation with update and discussion. After initial assessment and treatment, an updated evaluation reveals no change in clinical status, patient and husband updated on  findings and plan. Daleen Bo   Medical Decision Making:  This patient is presenting for evaluation of sudden onset of dizziness, which does require a range of treatment options, and is a complaint that involves a high risk of morbidity and mortality. The differential diagnoses include CVA, vestibular neuropathy, metabolic disorder, hemodynamic instability. I decided to review old records, and in summary middle-aged female without prior history of cardiac, pulmonary or CNS disease.  She has history of fibromyalgia, and panic attacks..  I obtained additional historical information from husband at bedside.  Clinical Laboratory Tests Ordered, included CBC, Metabolic panel, and coagulation  panel, alcohol level, urinalysis . Review indicates normal findings on preliminary studies.. Radiologic Tests Ordered, included CT head.  I independently Visualized: Radiographic images, which show no acute abnormalities  Cardiac Monitor Tracing which shows normal sinus rhythm    Critical Interventions-clinical evaluation, CT imaging, laboratory testing, structural screen, observation and reassessment  After These Interventions, the Patient was reevaluated and was found with dizziness preventing her from walking.  Mild dizziness when supine.  Evaluation reassuring.  Unable to rule out posterior circulation stroke, with CT imaging.  Plan consult neuro hospitalist, and consider getting MRI  CRITICAL CARE-yes Performed by: Daleen Bo  Nursing Notes Reviewed/ Care Coordinated Applicable Imaging Reviewed Interpretation of Laboratory Data incorporated into ED treatment  Plan as per oncoming team in conjunction with neuro hospitalist.    Final Clinical Impression(s) / ED Diagnoses Final diagnoses:  Dizziness    Rx / DC Orders ED Discharge Orders     None        Daleen Bo, MD 12/02/20 [phone]

## 2020-12-01 NOTE — Telephone Encounter (Signed)
Pended.

## 2020-12-02 ENCOUNTER — Inpatient Hospital Stay (HOSPITAL_COMMUNITY): Payer: Medicare HMO

## 2020-12-02 DIAGNOSIS — Z888 Allergy status to other drugs, medicaments and biological substances status: Secondary | ICD-10-CM | POA: Diagnosis not present

## 2020-12-02 DIAGNOSIS — E039 Hypothyroidism, unspecified: Secondary | ICD-10-CM | POA: Diagnosis present

## 2020-12-02 DIAGNOSIS — Z882 Allergy status to sulfonamides status: Secondary | ICD-10-CM | POA: Diagnosis not present

## 2020-12-02 DIAGNOSIS — Z9049 Acquired absence of other specified parts of digestive tract: Secondary | ICD-10-CM | POA: Diagnosis not present

## 2020-12-02 DIAGNOSIS — Z20822 Contact with and (suspected) exposure to covid-19: Secondary | ICD-10-CM | POA: Diagnosis present

## 2020-12-02 DIAGNOSIS — Z808 Family history of malignant neoplasm of other organs or systems: Secondary | ICD-10-CM | POA: Diagnosis not present

## 2020-12-02 DIAGNOSIS — F32A Depression, unspecified: Secondary | ICD-10-CM | POA: Diagnosis present

## 2020-12-02 DIAGNOSIS — F419 Anxiety disorder, unspecified: Secondary | ICD-10-CM

## 2020-12-02 DIAGNOSIS — G43909 Migraine, unspecified, not intractable, without status migrainosus: Secondary | ICD-10-CM | POA: Diagnosis present

## 2020-12-02 DIAGNOSIS — Z818 Family history of other mental and behavioral disorders: Secondary | ICD-10-CM | POA: Diagnosis not present

## 2020-12-02 DIAGNOSIS — K219 Gastro-esophageal reflux disease without esophagitis: Secondary | ICD-10-CM | POA: Diagnosis present

## 2020-12-02 DIAGNOSIS — Z6830 Body mass index (BMI) 30.0-30.9, adult: Secondary | ICD-10-CM | POA: Diagnosis not present

## 2020-12-02 DIAGNOSIS — Z811 Family history of alcohol abuse and dependence: Secondary | ICD-10-CM | POA: Diagnosis not present

## 2020-12-02 DIAGNOSIS — M797 Fibromyalgia: Secondary | ICD-10-CM | POA: Diagnosis present

## 2020-12-02 DIAGNOSIS — R531 Weakness: Secondary | ICD-10-CM | POA: Diagnosis present

## 2020-12-02 DIAGNOSIS — Z83438 Family history of other disorder of lipoprotein metabolism and other lipidemia: Secondary | ICD-10-CM | POA: Diagnosis not present

## 2020-12-02 DIAGNOSIS — R42 Dizziness and giddiness: Secondary | ICD-10-CM | POA: Diagnosis not present

## 2020-12-02 DIAGNOSIS — E669 Obesity, unspecified: Secondary | ICD-10-CM | POA: Diagnosis present

## 2020-12-02 DIAGNOSIS — Z8371 Family history of colonic polyps: Secondary | ICD-10-CM | POA: Diagnosis not present

## 2020-12-02 DIAGNOSIS — I951 Orthostatic hypotension: Secondary | ICD-10-CM | POA: Diagnosis not present

## 2020-12-02 DIAGNOSIS — E7800 Pure hypercholesterolemia, unspecified: Secondary | ICD-10-CM | POA: Diagnosis present

## 2020-12-02 DIAGNOSIS — F909 Attention-deficit hyperactivity disorder, unspecified type: Secondary | ICD-10-CM | POA: Diagnosis present

## 2020-12-02 DIAGNOSIS — Z8249 Family history of ischemic heart disease and other diseases of the circulatory system: Secondary | ICD-10-CM | POA: Diagnosis not present

## 2020-12-02 DIAGNOSIS — Z885 Allergy status to narcotic agent status: Secondary | ICD-10-CM | POA: Diagnosis not present

## 2020-12-02 DIAGNOSIS — I726 Aneurysm of vertebral artery: Secondary | ICD-10-CM | POA: Diagnosis present

## 2020-12-02 LAB — URINALYSIS, ROUTINE W REFLEX MICROSCOPIC
Bilirubin Urine: NEGATIVE
Glucose, UA: NEGATIVE mg/dL
Hgb urine dipstick: NEGATIVE
Ketones, ur: NEGATIVE mg/dL
Leukocytes,Ua: NEGATIVE
Nitrite: NEGATIVE
Protein, ur: NEGATIVE mg/dL
Specific Gravity, Urine: 1.009 (ref 1.005–1.030)
pH: 7 (ref 5.0–8.0)

## 2020-12-02 LAB — CREATININE, SERUM
Creatinine, Ser: 0.76 mg/dL (ref 0.44–1.00)
GFR, Estimated: >60 mL/min (ref >60)

## 2020-12-02 LAB — RAPID URINE DRUG SCREEN, HOSP PERFORMED
Amphetamines: NOT DETECTED
Barbiturates: NOT DETECTED
Benzodiazepines: POSITIVE — AB
Cocaine: NOT DETECTED
Opiates: NOT DETECTED
Tetrahydrocannabinol: NOT DETECTED

## 2020-12-02 LAB — HIV ANTIBODY (ROUTINE TESTING W REFLEX): HIV Screen 4th Generation wRfx: NONREACTIVE

## 2020-12-02 LAB — MAGNESIUM: Magnesium: 2.4 mg/dL (ref 1.7–2.4)

## 2020-12-02 LAB — CBC
HCT: 43 % (ref 36.0–46.0)
Hemoglobin: 13.8 g/dL (ref 12.0–15.0)
MCH: 31 pg (ref 26.0–34.0)
MCHC: 32.1 g/dL (ref 30.0–36.0)
MCV: 96.6 fL (ref 80.0–100.0)
Platelets: 216 10*3/uL (ref 150–400)
RBC: 4.45 MIL/uL (ref 3.87–5.11)
RDW: 13 % (ref 11.5–15.5)
WBC: 5 10*3/uL (ref 4.0–10.5)
nRBC: 0 % (ref 0.0–0.2)

## 2020-12-02 LAB — TSH: TSH: 1.564 u[IU]/mL (ref 0.350–4.500)

## 2020-12-02 MED ORDER — MECLIZINE HCL 25 MG PO TABS
25.0000 mg | ORAL_TABLET | Freq: Once | ORAL | Status: AC
  Administered 2020-12-02: 25 mg via ORAL
  Filled 2020-12-02: qty 1

## 2020-12-02 MED ORDER — ALPRAZOLAM 1 MG PO TABS
2.0000 mg | ORAL_TABLET | Freq: Three times a day (TID) | ORAL | Status: DC | PRN
  Administered 2020-12-02 – 2020-12-04 (×6): 2 mg via ORAL
  Filled 2020-12-02 (×6): qty 2

## 2020-12-02 MED ORDER — ASPIRIN 81 MG PO CHEW
81.0000 mg | CHEWABLE_TABLET | Freq: Once | ORAL | Status: AC
  Administered 2020-12-02: 81 mg via ORAL
  Filled 2020-12-02: qty 1

## 2020-12-02 MED ORDER — VENLAFAXINE HCL ER 75 MG PO CP24
225.0000 mg | ORAL_CAPSULE | Freq: Every day | ORAL | Status: DC
  Administered 2020-12-02 – 2020-12-04 (×3): 225 mg via ORAL
  Filled 2020-12-02 (×3): qty 1

## 2020-12-02 MED ORDER — SODIUM CHLORIDE 0.9 % IV SOLN: INTRAVENOUS | Status: DC

## 2020-12-02 MED ORDER — ACETAMINOPHEN 650 MG RE SUPP: 650.0000 mg | Freq: Four times a day (QID) | RECTAL | Status: DC | PRN

## 2020-12-02 MED ORDER — OXYMETAZOLINE HCL 0.05 % NA SOLN
2.0000 | Freq: Two times a day (BID) | NASAL | Status: DC | PRN
  Filled 2020-12-02: qty 15

## 2020-12-02 MED ORDER — TRAMADOL HCL 50 MG PO TABS
50.0000 mg | ORAL_TABLET | Freq: Two times a day (BID) | ORAL | Status: DC | PRN
  Administered 2020-12-02 – 2020-12-03 (×2): 50 mg via ORAL
  Filled 2020-12-02 (×2): qty 1

## 2020-12-02 MED ORDER — ATORVASTATIN CALCIUM 40 MG PO TABS
40.0000 mg | ORAL_TABLET | Freq: Every day | ORAL | Status: DC
  Administered 2020-12-02 – 2020-12-04 (×3): 40 mg via ORAL
  Filled 2020-12-02 (×3): qty 1

## 2020-12-02 MED ORDER — LORAZEPAM 2 MG/ML IJ SOLN
0.5000 mg | Freq: Once | INTRAMUSCULAR | Status: AC
  Administered 2020-12-02: 0.5 mg via INTRAVENOUS
  Filled 2020-12-02: qty 1

## 2020-12-02 MED ORDER — PANTOPRAZOLE SODIUM 40 MG PO TBEC
40.0000 mg | DELAYED_RELEASE_TABLET | Freq: Every day | ORAL | Status: DC
  Administered 2020-12-02 – 2020-12-04 (×3): 40 mg via ORAL
  Filled 2020-12-02 (×3): qty 1

## 2020-12-02 MED ORDER — ONDANSETRON HCL 4 MG/2ML IJ SOLN: 4.0000 mg | Freq: Four times a day (QID) | INTRAMUSCULAR | Status: DC | PRN

## 2020-12-02 MED ORDER — GADOBUTROL 1 MMOL/ML IV SOLN
9.0000 mL | Freq: Once | INTRAVENOUS | Status: AC | PRN
  Administered 2020-12-02: 9 mL via INTRAVENOUS

## 2020-12-02 MED ORDER — GABAPENTIN 300 MG PO CAPS
300.0000 mg | ORAL_CAPSULE | Freq: Three times a day (TID) | ORAL | Status: DC
  Administered 2020-12-02 – 2020-12-04 (×6): 300 mg via ORAL
  Filled 2020-12-02 (×6): qty 1

## 2020-12-02 MED ORDER — VENLAFAXINE HCL ER 75 MG PO CP24: 75.0000 mg | ORAL_CAPSULE | Freq: Every day | ORAL | Status: DC

## 2020-12-02 MED ORDER — OMEGA-3-ACID ETHYL ESTERS 1 G PO CAPS
1.0000 g | ORAL_CAPSULE | Freq: Two times a day (BID) | ORAL | Status: DC
  Administered 2020-12-02 – 2020-12-04 (×5): 1 g via ORAL
  Filled 2020-12-02 (×5): qty 1

## 2020-12-02 MED ORDER — LEVOTHYROXINE SODIUM 25 MCG PO TABS
25.0000 ug | ORAL_TABLET | Freq: Every day | ORAL | Status: DC
  Administered 2020-12-03 – 2020-12-04 (×2): 25 ug via ORAL
  Filled 2020-12-02 (×2): qty 1

## 2020-12-02 MED ORDER — IBUPROFEN 400 MG PO TABS
400.0000 mg | ORAL_TABLET | Freq: Four times a day (QID) | ORAL | Status: DC | PRN
  Administered 2020-12-02 – 2020-12-03 (×2): 400 mg via ORAL
  Filled 2020-12-02 (×2): qty 1

## 2020-12-02 MED ORDER — ASPIRIN 81 MG PO CHEW
81.0000 mg | CHEWABLE_TABLET | Freq: Once | ORAL | Status: DC
  Filled 2020-12-02: qty 1

## 2020-12-02 MED ORDER — QUETIAPINE FUMARATE 300 MG PO TABS
800.0000 mg | ORAL_TABLET | Freq: Every day | ORAL | Status: DC
  Administered 2020-12-02 – 2020-12-03 (×2): 800 mg via ORAL
  Filled 2020-12-02 (×2): qty 1

## 2020-12-02 MED ORDER — ENOXAPARIN SODIUM 40 MG/0.4ML IJ SOSY
40.0000 mg | PREFILLED_SYRINGE | INTRAMUSCULAR | Status: DC
  Administered 2020-12-02 – 2020-12-04 (×3): 40 mg via SUBCUTANEOUS
  Filled 2020-12-02 (×3): qty 0.4

## 2020-12-02 MED ORDER — ACETAMINOPHEN 325 MG PO TABS
650.0000 mg | ORAL_TABLET | Freq: Four times a day (QID) | ORAL | Status: DC | PRN
  Administered 2020-12-02: 650 mg via ORAL
  Filled 2020-12-02: qty 2

## 2020-12-02 MED ORDER — ONDANSETRON HCL 4 MG PO TABS: 4.0000 mg | ORAL_TABLET | Freq: Four times a day (QID) | ORAL | Status: DC | PRN

## 2020-12-02 NOTE — ED Notes (Signed)
Pt speaking with neuro doctor on tele stroke robot

## 2020-12-02 NOTE — H&P (Signed)
History and Physical    Leslie Benson [id number] DOB: 1961/03/17 DOA: 12/01/2020  PCP: [name], NP  Patient coming from: Home  I have personally briefly reviewed patient's old medical records in [place]  Chief Complaint: Dizziness  HPI: Leslie Benson is a 60 y.o. female with medical history significant of anxiety, fibromyalgia, chronic headache, obesity, GERD presents with complaint of dizziness.  Patient reports that her symptoms started after dinner.  She was lying down in her bed for couple of hours after eating dinner at 6:00.  She was doing well at that time.  Reports that when she tried to get out of bed at 9:00 she had difficulty walking due to severe dizziness.  She felt like she was falling backward.  She called her husband who brought her to the ER for further evaluation and management.  She denies  blurry vision, slurred speech, facial drop, numbness weakness tingling sensation, chest pain, shortness of breath, palpitation, leg swelling, orthopnea, PND, fever, chills, nausea, vomiting, diarrhea, urinary or bowel changes.  No previous history of stroke or TIA.  Has chronic migraine.  She takes Xanax for anxiety and gabapentin for her fibromyalgia.  No history of smoking, alcohol, illicit drug use.  She lives with her husband at home.  Does not use anything for ambulation.  Independent on daily life activities.  ED Course: Upon arrival to ED: Patient's vital signs stable.  UA negative for infection, UDS positive for benzos.  CMP shows AST elevated at 43.  CBC, INR, ethanol, COVID-19 all within normal limits.  She received IV fluids, Ativan, meclizine in ED.  CT head negative for acute findings.  Patient was seen by teleneurology who recommended MRI brain, MRA head and neck and PT OT consult.  [facility] consulted for admission for further evaluation and management of her persistent dizziness.    Review of Systems: As per HPI otherwise negative.     Past Medical History:  Diagnosis Date   ADHD    Allergy    Anemia    past hx    Anxiety    Barrett esophagus    Breast nodule    benign   Cervical arthritis 09/22/2013   Depression    Elevated liver enzymes    Fatty liver    Fibromyalgia    Fibromyalgia    GERD (gastroesophageal reflux disease)    H/O hiatal hernia    Headache(784.0)    migraines   High cholesterol    History of UTI    Lymphocytic colitis    Migraines    Pain    Panic attack    Paraesophageal hiatal hernia    repaired 2009   Spinal stenosis    Thyroid disease    Thyroid disease     Past Surgical History:  Procedure Laterality Date   ANKLE SURGERY     CHOLECYSTECTOMY  1980s?   COLONOSCOPY  2010   Buccini   EUS N/A 11/19/2013   Procedure: ESOPHAGEAL ENDOSCOPIC ULTRASOUND (EUS) RADIAL;  Surgeon: Arta Silence, MD;  Location: [facility];  Service: Endoscopy;  Laterality: N/A;   LAPAROSCOPIC NISSEN FUNDOPLICATION  0000000   LAPAROSCOPIC PARAESOPHAGEAL HERNIA REPAIR  03/05/2008   Dr Johney Maine   TUBAL LIGATION  1990s?   UPPER GASTROINTESTINAL ENDOSCOPY  04/05/2018   Scarlette Shorts    VAGINAL HYSTERECTOMY  03/04/2001     reports that she has never smoked. She has never used smokeless tobacco. She reports that she does not currently use alcohol.  She reports that she does not use drugs.  Allergies  Allergen Reactions   Codeine Nausea And Vomiting   Compazine [Prochlorperazine Edisylate]     "feels weird"   Sulfa Antibiotics     Unknown reaction.    Family History  Problem Relation Age of Onset   Alcohol abuse Other    Elevated Lipids Other    Heart disease Other    Anxiety disorder Mother    Obesity Mother    High Cholesterol Mother    Thyroid cancer Mother    Depression Mother    Breast cancer Mother    Bone cancer Mother    High Cholesterol Father    Heart disease Father    Alcoholism Father    Colon cancer Maternal Aunt    Colon polyps Sister    Stomach cancer Neg Hx     Esophageal cancer Neg Hx    Rectal cancer Neg Hx     Prior to Admission medications   Medication Sig Start Date End Date Taking? Authorizing Provider  ALPRAZolam (XANAX) 1 MG tablet TAKE 2 TABLETS BY MOUTH THREE TIMES DAILY AS NEEDED FOR ANXIETY Patient taking differently: Take 2 mg by mouth 3 (three) times daily as needed for anxiety. 11/04/20  Yes [name], [name], NP  atorvastatin (LIPITOR) 40 MG tablet Take 1 tablet (40 mg total) by mouth daily. Patient is as needed follow up with Dr Marlou Porch, further refills will need to be authorized by patients PCP 09/28/20  Yes Jerline Pain, MD  gabapentin (NEURONTIN) 300 MG capsule Take 3 capsules (900 mg total) by mouth 3 (three) times daily. Patient taking differently: Take 300 mg by mouth 3 (three) times daily. 11/30/20 02/28/21 Yes Nafziger, Tommi Rumps, NP  levothyroxine (SYNTHROID) 25 MCG tablet Take 1 tablet (25 mcg total) by mouth daily before breakfast. 04/02/20  Yes Nafziger, Tommi Rumps, NP  omega-3 acid ethyl esters (LOVAZA) 1 G capsule Take 1 g by mouth 2 (two) times daily.   Yes [provider]  omeprazole (PRILOSEC) 20 MG capsule TAKE 1 CAPSULE EVERY DAY Patient taking differently: Take 20 mg by mouth daily as needed (acid reflux). 07/08/20  Yes Irene Shipper, MD  OVER THE COUNTER MEDICATION Take 1 tablet by mouth daily. Fiber Well   Yes [provider]  QUEtiapine (SEROQUEL) 400 MG tablet Take 800 mg by mouth at bedtime.   Yes [provider]  venlafaxine XR (EFFEXOR-XR) 75 MG 24 hr capsule TAKE 3 CAPSULES EVERY DAY Patient taking differently: Take 75 mg by mouth in the morning, at noon, and at bedtime. 11/12/20  Yes [name], [name], NP  PFIZER-BIONT COVID-19 VAC-TRIS SUSP injection  08/23/20   [provider]    Physical Exam: Vitals:   12/02/20 0530 12/02/20 0600 12/02/20 0630 12/02/20 0800  BP: 125/80 92/72 109/83 122/78  Pulse: 64 65 64 76  Resp: '12 12 19 12  '$ Temp:      TempSrc:      SpO2: 95%  94% 94% 97%  Weight:      Height:        Constitutional: NAD, calm, comfortable, obese, lying comfortably on the bed, communicating well, following commands appropriately. Eyes: PERRL, lids and conjunctivae normal ENMT: Mucous membranes are moist. Posterior pharynx clear of any exudate or lesions.Normal dentition.  Neck: normal, supple, no masses, no thyromegaly Respiratory: clear to auscultation bilaterally, no wheezing, no crackles. Normal respiratory effort. No accessory muscle use.  Cardiovascular: Regular rate and rhythm, no murmurs / rubs /  gallops. No extremity edema. 2+ pedal pulses. No carotid bruits.  Abdomen: no tenderness, no masses palpated. No hepatosplenomegaly. Bowel sounds positive.  Musculoskeletal: no clubbing / cyanosis. No joint deformity upper and lower extremities. Good ROM, no contractures. Normal muscle tone.  Skin: no rashes, lesions, ulcers. No induration Neurologic: CN 2-12 grossly intact. Sensation intact, DTR normal. Strength 5/5 in all 4.  Psychiatric: Normal judgment and insight. Alert and oriented x 3. Normal mood.    Labs on Admission: I have personally reviewed following labs and imaging studies  CBC: Recent Labs  Lab 11/30/20 1334 12/01/20 2215 12/01/20 2226  WBC 5.4 7.3  --   NEUTROABS 2.3 2.5  --   HGB 13.5 12.8 12.6  HCT 40.7 40.2 37.0  MCV 92.4 95.7  --   PLT 250.0 217  --    Basic Metabolic Panel: Recent Labs  Lab 11/30/20 1334 12/01/20 2215 12/01/20 2226  NA 138 137 138  K 4.5 4.2 4.5  CL 102 102 101  CO2 27 24  --   GLUCOSE 89 116* 120*  BUN '12 17 19  '$ CREATININE 0.91 0.89 0.80  CALCIUM 9.8 9.1  --    GFR: Estimated Creatinine Clearance: 80.6 mL/min (by C-G formula based on SCr of 0.8 mg/dL). Liver Function Tests: Recent Labs  Lab 11/30/20 1334 12/01/20 2215  AST 24 43*  ALT 35 44  ALKPHOS 121* 105  BILITOT 0.3 0.4  PROT 7.0 6.9  ALBUMIN 4.5 4.3   No results for input(s): LIPASE, AMYLASE in the last 168 hours. No  results for input(s): AMMONIA in the last 168 hours. Coagulation Profile: Recent Labs  Lab 12/01/20 2215  INR 0.9   Cardiac Enzymes: No results for input(s): CKTOTAL, CKMB, CKMBINDEX, TROPONINI in the last 168 hours. BNP (last 3 results) No results for input(s): PROBNP in the last 8760 hours. HbA1C: Recent Labs    11/30/20 1334  HGBA1C 5.6   CBG: Recent Labs  Lab 12/01/20 2213  GLUCAP 110*   Lipid Profile: Recent Labs    11/30/20 1334  CHOL 187  HDL 80.00  LDLCALC 89  TRIG 91.0  CHOLHDL 2   Thyroid Function Tests: Recent Labs    11/30/20 1334  TSH 1.48   Anemia Panel: No results for input(s): VITAMINB12, FOLATE, FERRITIN, TIBC, IRON, RETICCTPCT in the last 72 hours. Urine analysis:    Component Value Date/Time   COLORURINE STRAW (A) 12/02/2020 0200   APPEARANCEUR CLEAR 12/02/2020 0200   LABSPEC 1.009 12/02/2020 0200   PHURINE 7.0 12/02/2020 0200   GLUCOSEU NEGATIVE 12/02/2020 0200   HGBUR NEGATIVE 12/02/2020 0200   BILIRUBINUR NEGATIVE 12/02/2020 0200   BILIRUBINUR neg 11/11/2015 1700   KETONESUR NEGATIVE 12/02/2020 0200   PROTEINUR NEGATIVE 12/02/2020 0200   UROBILINOGEN 0.2 11/11/2015 1700   UROBILINOGEN 0.2 06/28/2010 0054   NITRITE NEGATIVE 12/02/2020 0200   LEUKOCYTESUR NEGATIVE 12/02/2020 0200    Radiological Exams on Admission: CT HEAD WO CONTRAST (5MM)  Result Date: 12/01/2020 CLINICAL DATA:  Dizziness, left-sided weakness EXAM: CT HEAD WITHOUT CONTRAST TECHNIQUE: Contiguous axial images were obtained from the base of the skull through the vertex without intravenous contrast. COMPARISON:  09/29/2009 FINDINGS: Brain: No evidence of acute infarction, hemorrhage, hydrocephalus, extra-axial collection or mass lesion/mass effect. Vascular: No hyperdense vessel or unexpected calcification. Skull: Normal. Negative for fracture or focal lesion. Sinuses/Orbits: The visualized paranasal sinuses are essentially clear. The mastoid air cells are unopacified.  Other: None. IMPRESSION: Normal head CT. Electronically Signed   By: Bertis Ruddy  [place] Pink M.D.   On: 12/01/2020 22:43    EKG: Independently reviewed.  Sinus rhythm, RBBB, no acute ST-T wave changes noted.  Assessment/Plan Principal Problem:   Dizziness Active Problems:   Fibromyalgia   Obesity (BMI 30-39.9)   Hypothyroidism   Other hyperlipidemia   GERD (gastroesophageal reflux disease)   Anxiety    Dizziness: -Unknown etiology?  Polypharmacy versus BPPV -CT head negative for acute findings.  UA negative for infection.  She is afebrile with no leukocytosis.  UDS is positive for benzos as she takes Xanax at home. -PT/INR, ethanol: Within normal limits, COVID: Negative -Continue gentle hydration -Awaiting MRI brain, MRA head and neck -Appreciate neurology recommendation -Start on aspirin. -Consult PT/OT -On fall precautions.  Check TSH  Hypothyroidism: Check TSH -Continue levothyroxine  Fibromyalgia: Continue gabapentin  Hyperlipidemia: Continue statin  Depression/anxiety: Continue home meds Seroquel, Effexor and Xanax  GERD: Continue PPI   DVT prophylaxis: Lovenox/SCD Code Status: Full code Family Communication: None present at bedside.  Plan of care discussed with patient in length and she verbalized understanding and agreed with it. Disposition Plan: Likely home in 1 to 2 days Consults called: Neurology Admission status: Inpatient   Mckinley Jewel MD [facility]  If 7PM-7AM, please contact night-coverage [email or web address]  12/02/2020, 11:50 AM

## 2020-12-02 NOTE — Progress Notes (Signed)
Pt ambulated to bathroom with husband. NT entered the room and observed pt highly unsteady on her feet and difficulties walking with husband. Encouraged pt and family to alert nursing staff when ambulation or needs required for pt safety.

## 2020-12-02 NOTE — Consult Note (Signed)
[facility] Tele[facility]urology Consult Services  Stat Consult  Date of Service:   12/02/2020 00:40:48  Diagnosis:       R42 - Dizziness/ Vertigo/ Giddiness  Impression 60yo woman with history of anxiety d/o, fibromyalgia, chronic daily headache presents with dizziness. Symptoms were noted at 9pm, but she had not moved for a couple of hours prior to this and is only symptomatic with walking, thus last known well is murky and 9pm at best. At the time of my evaluation patient is not a tPA candidate as last known well is > 4.5 hours out (I was personally notified of the case at 1:28am EST). Suspect peripheral vertigo more likely due to BPPV given the positional nature of the symptoms, but posterior circulation stroke should be excluded given the patient cannot walk with MRI brain w/o, MRA head/neck w/o. Can start aspirin for now, trial of meclizine.  Our recommendations are outlined below.  Diagnostic Studies: Recommend MRI brain without contrast Routine MRA head without contrast and MRA Neck with contrast  Medication: Initiate Aspirin 81 mg daily  Nursing Recommendations: Telemetry, IV Fluids, avoid dextrose containing fluids, Maintain euglycemia Neuro checks q4 hrs x 24 hrs and then per shift Head of bed 30 degrees  Consultations: Physical therapy/Occupational therapy  Disposition: Neurology will follow   Metrics: [facility] Notification Time: 12/02/2020 00:36:47 Stamp Time: 12/02/2020 00:40:48 Callback Response Time: 12/02/2020 00:41:46   ----------------------------------------------------------------------------------------------------  Chief Complaint: dizziness  History of Present Illness: Patient is a 60 year old Female.  60yo woman with history of anxiety d/o, fibromyalgia, chronic daily headache presents with dizziness. She reports she was laying in bed for several hours after eating dinner at 6pm, felt normal at that time. She tried to get  out of bed at 9pm and had difficulty walking due to significant dizziness, felt like she was falling backward. She called her husband for help due to this. She had not tried to get out of bed between roughly 6pm when she ate dinner and went to the bedroom to watch TV and 9pm. She does not feel any dizziness at rest, and only has difficulty when she tries to walk/with movement or position change. No associated focal weakness with these symptoms. She has daily headaches/history of chronic migraines. No prior history of stroke, does not take aspirin or any anticoagulants.          Examination: BP(114/97), Pulse(79), Blood Glucose(116) 1A: Level of Consciousness - Alert; keenly responsive + 0 1B: Ask Month and Age - Both Questions Right + 0 1C: Blink Eyes & Squeeze Hands - Performs Both Tasks + 0 2: Test Horizontal Extraocular Movements - Normal + 0 3: Test Visual Fields - No Visual Loss + 0 4: Test Facial Palsy (Use Grimace if Obtunded) - Normal symmetry + 0 5A: Test Left Arm Motor Drift - No Drift for 10 Seconds + 0 5B: Test Right Arm Motor Drift - No Drift for 10 Seconds + 0 6A: Test Left Leg Motor Drift - No Drift for 5 Seconds + 0 6B: Test Right Leg Motor Drift - No Drift for 5 Seconds + 0 7: Test Limb Ataxia (FNF/Heel-Shin) - No Ataxia + 0 8: Test Sensation - Mild-Moderate Loss: Less Sharp/More Dull + 1 9: Test Language/Aphasia - Normal; No aphasia + 0 10: Test Dysarthria - Normal + 0 11: Test Extinction/Inattention - No abnormality + 0  NIHSS Score: 1 NIHSS Free Text : Reports slightly reduced sensation on the left side to light touch versus the right,  she is not sure if this is new or chronic for her, no spontaneous nystagmus seen with gaze     Patient / Family was informed the Neurology Consult would occur via TeleHealth consult by way of interactive audio and video telecommunications and consented to receiving care in this manner.  Patient is being evaluated for possible acute  neurologic impairment and high probability of imminent or life - threatening deterioration.I spent total of 40 minutes providing care to this patient, including time for face to face visit via telemedicine, review of medical records, imaging studies and discussion of findings with providers, the patient and / or family.   Dr Annamary Carolin   [facility] [phone]  Case [id number]

## 2020-12-02 NOTE — ED Provider Notes (Signed)
Patient seen by teleneurology.  They suspect this is peripheral vertigo, but do recommend MRI brain later in the morning.  Also recommend aspirin and Antivert.  Patient still reports significant vertigo with any movement.  She also reports urinary retention which she has had previously.  She is requesting an In-N-Out catheter Bladder scan showed over 700 mils of urine   Leslie Fraise, MD 12/02/20 [phone]

## 2020-12-02 NOTE — ED Provider Notes (Signed)
Patient unable to ambulate despite multiple medications.  Patient be admitted.  Discussed with Dr. Hal Hope, plan to admit, treat vertigo, plan for MRI/MRA per neurology recs   Ripley Fraise, MD 12/02/20 [phone]

## 2020-12-02 NOTE — ED Notes (Signed)
Attempt to get pt out of bed and ambulate. Pt sat on edge of bed and got very dizzy. Pt stood up and started falling back into the bed due to dizziness. Dr. Christy Gentles notified

## 2020-12-03 LAB — CBC
HCT: 37.9 % (ref 36.0–46.0)
Hemoglobin: 12.1 g/dL (ref 12.0–15.0)
MCH: 30.9 pg (ref 26.0–34.0)
MCHC: 31.9 g/dL (ref 30.0–36.0)
MCV: 96.9 fL (ref 80.0–100.0)
Platelets: 215 10*3/uL (ref 150–400)
RBC: 3.91 MIL/uL (ref 3.87–5.11)
RDW: 13.2 % (ref 11.5–15.5)
WBC: 5.3 10*3/uL (ref 4.0–10.5)
nRBC: 0 % (ref 0.0–0.2)

## 2020-12-03 LAB — COMPREHENSIVE METABOLIC PANEL
ALT: 33 U/L (ref 0–44)
AST: 21 U/L (ref 15–41)
Albumin: 3.9 g/dL (ref 3.5–5.0)
Alkaline Phosphatase: 94 U/L (ref 38–126)
Anion gap: 8 (ref 5–15)
BUN: 10 mg/dL (ref 8–23)
CO2: 24 mmol/L (ref 22–32)
Calcium: 8.8 mg/dL — ABNORMAL LOW (ref 8.9–10.3)
Chloride: 107 mmol/L (ref 98–111)
Creatinine, Ser: 0.8 mg/dL (ref 0.44–1.00)
GFR, Estimated: >60 mL/min (ref >60)
Glucose, Bld: 104 mg/dL — ABNORMAL HIGH (ref 70–99)
Potassium: 3.8 mmol/L (ref 3.5–5.1)
Sodium: 139 mmol/L (ref 135–145)
Total Bilirubin: 0.4 mg/dL (ref 0.3–1.2)
Total Protein: 6.3 g/dL — ABNORMAL LOW (ref 6.5–8.1)

## 2020-12-03 MED ORDER — MECLIZINE HCL 25 MG PO TABS: 25.0000 mg | ORAL_TABLET | Freq: Three times a day (TID) | ORAL | Status: DC | PRN

## 2020-12-03 MED ORDER — LOPERAMIDE HCL 2 MG PO CAPS: 2.0000 mg | ORAL_CAPSULE | ORAL | Status: DC | PRN

## 2020-12-03 NOTE — Evaluation (Signed)
Occupational Therapy Evaluation Patient Details Name: Leslie Benson MRN: [id number] DOB: 10-11-1960 Today's Date: 12/03/2020    History of Present Illness 60 y.o. female admitted 12/02/20 with dizziness. CT & MRI of head negative for infarct/masses/hemorrhage.  Pt with medical history significant of anxiety, fibromyalgia, chronic headache, obesity, GERD   Clinical Impression   Patient lives in [place] with spouse, is typically independent with self care tasks and does not use an AD. Reports likes to watch TV at home. Currently patient limited by orthostatic hypotension although denies symptoms stating she feels better today than when she first came into hospital. Main complains of headache and feeling tired. Patient overall min G assist for safety due to mildly unsteady although no overt loss of balance when transferring to recliner chair. Encourage patient to sit up to chair to eat her breakfast, note patient back to bed ~10 mins after OT leave. Did notify nursing staff patient + for orthostatics however recovered to 102/67 once seated in recliner and not complaining of dizziness. Acute OT to follow to maximize patient overall endurance and safety with daily routine in order to D/C home.    Follow Up Recommendations  No OT follow up    Equipment Recommendations  Tub/shower seat       Precautions / Restrictions Precautions Precautions: Fall Precaution Comments: monitor vitals Restrictions Weight Bearing Restrictions: No      Mobility Bed Mobility Overal bed mobility: Modified Independent                  Transfers Overall transfer level: Needs assistance Equipment used: None Transfers: Sit to/from Stand;Stand Pivot Transfers Sit to Stand: Supervision Stand pivot transfers: Min guard       General transfer comment: min G for safety, mild unsteadiness    Balance Overall balance assessment: Mild deficits observed, not formally tested                                          ADL either performed or assessed with clinical judgement   ADL Overall ADL's : Needs assistance/impaired Eating/Feeding: Independent   Grooming: Set up;Sitting   Upper Body Bathing: Set up;Sitting   Lower Body Bathing: Min guard;Sit to/from stand   Upper Body Dressing : Set up;Sitting   Lower Body Dressing: Min guard;Sit to/from stand Lower Body Dressing Details (indicate cue type and reason): min G in standing for safety due to hx dizziness Toilet Transfer: Min Designer, jewellery Details (indicate cue type and reason): min G for safety Toileting- Clothing Manipulation and Hygiene: Min guard;Sit to/from stand       Functional mobility during ADLs: Min guard General ADL Comments: patient appears close to baseline, is a little unsteady                         Pertinent Vitals/Pain Pain Assessment: Faces Faces Pain Scale: Hurts little more Pain Location: head Pain Descriptors / Indicators: Headache Pain Intervention(s): Monitored during session     Hand Dominance Right   Extremity/Trunk Assessment Upper Extremity Assessment Upper Extremity Assessment: Overall WFL for tasks assessed   Lower Extremity Assessment Lower Extremity Assessment: Defer to PT evaluation       Communication Communication Communication: No difficulties   Cognition Arousal/Alertness: Awake/alert Behavior During Therapy: Flat affect Overall Cognitive Status: Within Functional Limits for tasks assessed  General Comments  please see vitals from 0810 for [place] expects to be discharged to:: Private residence Living Arrangements: Spouse/significant other Available Help at Discharge: Family;Available PRN/intermittently Type of Home:  (condo) Home Access: Level entry     Home Layout: One level     Bathroom Shower/Tub: Engineer, site: Standard     Home Equipment: Environmental consultant - 2 wheels;Crutches          Prior Functioning/Environment Level of Independence: Independent                 OT Problem List: Decreased activity tolerance;Impaired balance (sitting and/or standing)         OT Goals(Current goals can be found in the care plan section) Acute Rehab OT Goals Patient Stated Goal: likes to watch TV OT Goal Formulation: With patient Time For Goal Achievement: 12/17/20 Potential to Achieve Goals: Good  OT Frequency: Min 2X/week    AM-PAC OT "6 Clicks" Daily Activity     Outcome Measure Help from another person eating meals?: None Help from another person taking care of personal grooming?: A Little Help from another person toileting, which includes using toliet, bedpan, or urinal?: A Little Help from another person bathing (including washing, rinsing, drying)?: A Little Help from another person to put on and taking off regular upper body clothing?: A Little Help from another person to put on and taking off regular lower body clothing?: A Little 6 Click Score: 19   End of Session Nurse Communication: Mobility status;Other (comment) (monitor BP)  Activity Tolerance: Patient limited by fatigue Patient left: in chair;with call bell/phone within reach;with chair alarm set  OT Visit Diagnosis: Unsteadiness on feet (R26.81);Other abnormalities of gait and mobility (R26.89)                Time: [id number] OT Time Calculation (min): 20 min Charges:  OT General Charges $OT Visit: 1 Visit OT Evaluation $OT Eval Low Complexity: [place] OT OT pager: [facility] 12/03/2020, 11:29 AM

## 2020-12-03 NOTE — Evaluation (Addendum)
Physical Therapy Evaluation Patient Details Name: Leslie Benson MRN: [id number] DOB: [birth year] Today's Date: 12/03/2020   History of Present Illness  60 y.o. female admitted 12/02/20 with dizziness. CT & MRI of head negative for infarct/masses/hemorrhage.  Pt with medical history significant of anxiety, fibromyalgia, chronic headache, obesity, GERD  Clinical Impression  Pt ambulated 160' holding IV pole, no loss of balance. She reported "lightheadedness" in sitting and standing, noted orthostatics were positive this morning during OT evaluation. Pt denied dizziness/spinning sensations. No nystagmus noted. From a PT standpoint, she is ready to DC home as she is modified independent with mobility. She has a RW available at home if needed. No further PT indicated, will sign off.     Follow Up Recommendations No PT follow up    Equipment Recommendations  None recommended by PT    Recommendations for Other Services       Precautions / Restrictions Precautions Precautions: Fall Precaution Comments: monitor vitals Restrictions Weight Bearing Restrictions: No      Mobility  Bed Mobility Overal bed mobility: Modified Independent                  Transfers Overall transfer level: Independent Equipment used: None Transfers: Sit to/from Stand Sit to Stand: Independent Stand pivot transfers: Min guard       General transfer comment: no loss of balance  Ambulation/Gait Ambulation/Gait assistance: Modified independent (Device/Increase time) Gait Distance (Feet): 160 Feet Assistive device: IV Pole Gait Pattern/deviations: WFL(Within Functional Limits) Gait velocity: WFL   General Gait Details: pt held IV pole, stated she feels more steady holding it and didn't want to attempt ambulating without holding IV pole, she has RW at home she can use; no loss of balance. Pt reports "lightheadedness" in standing (noted positive orthostatics this morning with OT), denied spinning  sensation.  Stairs            Wheelchair Mobility    Modified Rankin (Stroke Patients Only)       Balance Overall balance assessment: Modified Independent                                           Pertinent Vitals/Pain Pain Assessment: Faces Faces Pain Scale: Hurts little more Pain Location: head Pain Descriptors / Indicators: Headache Pain Intervention(s): Limited activity within patient's tolerance;Monitored during session;Premedicated before session    Home Living Family/patient expects to be discharged to:: Private residence Living Arrangements: Spouse/significant other Available Help at Discharge: Family;Available PRN/intermittently Type of Home:  (condo) Home Access: Level entry     Home Layout: One level Home Equipment: Walker - 2 wheels;Crutches Additional Comments: husband works, sister can stay with pt as needed    Prior Function Level of Independence: Independent               Hand Dominance   Dominant Hand: Right    Extremity/Trunk Assessment   Upper Extremity Assessment Upper Extremity Assessment: Defer to OT evaluation    Lower Extremity Assessment Lower Extremity Assessment: Overall WFL for tasks assessed       Communication   Communication: No difficulties  Cognition Arousal/Alertness: Awake/alert Behavior During Therapy: WFL for tasks assessed/performed Overall Cognitive Status: Within Functional Limits for tasks assessed  General Comments General comments (skin integrity, edema, etc.): see vitals from 0810 for orthostatics    Exercises     Assessment/Plan    PT Assessment Patent does not need any further PT services  PT Problem List         PT Treatment Interventions      PT Goals (Current goals can be found in the Care Plan section)  Acute Rehab PT Goals Patient Stated Goal: likes to watch TV PT Goal Formulation: All assessment and  education complete, DC therapy    Frequency     Barriers to discharge        Co-evaluation               AM-PAC PT "6 Clicks" Mobility  Outcome Measure Help needed turning from your back to your side while in a flat bed without using bedrails?: None Help needed moving from lying on your back to sitting on the side of a flat bed without using bedrails?: None Help needed moving to and from a bed to a chair (including a wheelchair)?: None Help needed standing up from a chair using your arms (e.g., wheelchair or bedside chair)?: None Help needed to walk in hospital room?: None Help needed climbing 3-5 steps with a railing? : None 6 Click Score: 24    End of Session Equipment Utilized During Treatment: Gait belt Activity Tolerance: Patient tolerated treatment well Patient left: in bed;with call bell/phone within reach Nurse Communication: Mobility status      Time: 1202-1220 PT Time Calculation (min) (ACUTE ONLY): 18 min   Charges:   PT Evaluation $PT Eval Low Complexity: 1 Low         Philomena Doheny PT 12/03/2020  Acute Rehabilitation Services Pager [phone] Office [phone]

## 2020-12-03 NOTE — Progress Notes (Signed)
PROGRESS NOTE    Leslie Benson  [id number] DOB: 06-Aug-1960 DOA: 12/01/2020 PCP: [name], NP     Brief Narrative:  Leslie Benson is a 60 y.o. female with medical history significant of anxiety, fibromyalgia, chronic headache, obesity, GERD presents with complaint of dizziness. Patient reports that her symptoms started after dinner.  She was lying down in her bed for couple of hours after eating dinner at 6:00.  She was doing well at that time.  Reports that when she tried to get out of bed at 9:00 she had difficulty walking due to severe dizziness.  She felt like she was falling backward. Patient was seen by teleneurology who recommended MRI brain, MRA head and neck and PT OT consult.  [facility] consulted for admission for further evaluation and management of her persistent dizziness.    New events last 24 hours / Subjective: Patient continues to have complaints of some dizziness this morning.  She also complains of some frontal headache and sensitivity to light, has migraines at baseline.  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  Discussed with neurology over the phone, insignificant finding -Orthostatic vital signs this morning 115/72 --> 87/70.  Started on IV fluid -PT OT -Trial Antivert for vertigo  Hypothyroidism -Continue Synthroid  Fibromyalgia -Continue gabapentin   Hyperlipidemia -Continue lipitor   Depression/anxiety -Continue Seroquel, Effexor, Xanax   DVT prophylaxis:  enoxaparin (LOVENOX) injection 40 mg Start: 12/02/20 1000 SCDs Start: 12/02/20 0834  Code Status:     Code Status Orders  (From admission, onward)            Start     Ordered   12/02/20 0835  Full code  Continuous        12/02/20 0836           Code Status History     Date Active Date Inactive Code Status Order ID Comments User Context   09/22/2013 0415 09/25/2013 1552 Full Code [id number] GEtta Quill DO ED      Family Communication: No family at bedside Disposition Plan:  Status is: Inpatient  Remains inpatient appropriate because:Inpatient level of care appropriate due to severity of illness  Dispo: The patient is from: Home              Anticipated d/c is to: Home              Patient currently is not medically stable to d/c.   Difficult to place patient No      Consultants:  Neurology  Procedures:  None   Antimicrobials:  Anti-infectives (From admission, onward)    None        Objective: Vitals:   12/02/20 1802 12/02/20 2031 12/03/20 0414 12/03/20 0500  BP: 126/77 122/80 104/60   Pulse: 76 78 79   Resp: '18 18 16   '$ Temp: 98.5 F (36.9 C) 98.5 F (36.9 C) 97.9 F (36.6 C)   TempSrc: Oral Oral Oral   SpO2: 95% 97% 96%   Weight:    90.5 kg  Height:        Intake/Output Summary (Last 24 hours) at 12/03/2020 1229 Last data filed at 12/03/2020 0600 Gross  per 24 hour  Intake 1215.77 ml  Output 400 ml  Net 815.77 ml   Filed Weights   12/01/20 2208 12/03/20 0500  Weight: 90.7 kg 90.5 kg    Examination:  General exam: Appears calm and comfortable  Respiratory system: Clear to auscultation. Respiratory effort normal. No respiratory distress. No conversational dyspnea.  Cardiovascular system: S1 & S2 heard, RRR. No murmurs. No pedal edema. Gastrointestinal system: Abdomen is nondistended, soft and nontender. Normal bowel sounds heard. Central nervous system: Alert and oriented. No focal neurological deficits. Speech clear.  Extremities: Symmetric in appearance  Skin: No rashes, lesions or ulcers on exposed skin  Psychiatry: Judgement and insight appear normal. Mood & affect  appropriate.   Data Reviewed: I have personally reviewed following labs and imaging studies  CBC: Recent Labs  Lab 11/30/20 1334 12/01/20 2215 12/01/20 2226 12/02/20 1218 12/03/20 0443  WBC 5.4 7.3  --  5.0 5.3  NEUTROABS 2.3 2.5  --   --   --   HGB 13.5 12.8 12.6 13.8 12.1  HCT 40.7 40.2 37.0 43.0 37.9  MCV 92.4 95.7  --  96.6 96.9  PLT 250.0 217  --  216 [id number]   Basic Metabolic Panel: Recent Labs  Lab 11/30/20 1334 12/01/20 2215 12/01/20 2226 12/02/20 1218 12/03/20 0443  NA 138 137 138  --  139  K 4.5 4.2 4.5  --  3.8  CL 102 102 101  --  107  CO2 27 24  --   --  24  GLUCOSE 89 116* 120*  --  104*  BUN '12 17 19  '$ --  10  CREATININE 0.91 0.89 0.80 0.76 0.80  CALCIUM 9.8 9.1  --   --  8.8*  MG  --   --   --  2.4  --    GFR: Estimated Creatinine Clearance: 80.4 mL/min (by C-G formula based on SCr of 0.8 mg/dL). Liver Function Tests: Recent Labs  Lab 11/30/20 1334 12/01/20 2215 12/03/20 0443  AST 24 43* 21  ALT 35 44 33  ALKPHOS 121* 105 94  BILITOT 0.3 0.4 0.4  PROT 7.0 6.9 6.3*  ALBUMIN 4.5 4.3 3.9   No results for input(s): LIPASE, AMYLASE in the last 168 hours. No results for input(s): AMMONIA in the last 168 hours. Coagulation Profile: Recent Labs  Lab 12/01/20 2215  INR 0.9   Cardiac Enzymes: No results for input(s): CKTOTAL, CKMB, CKMBINDEX, TROPONINI in the last 168 hours. BNP (last 3 results) No results for input(s): PROBNP in the last 8760 hours. HbA1C: Recent Labs    11/30/20 1334  HGBA1C 5.6   CBG: Recent Labs  Lab 12/01/20 2213  GLUCAP 110*   Lipid Profile: Recent Labs    11/30/20 1334  CHOL 187  HDL 80.00  LDLCALC 89  TRIG 91.0  CHOLHDL 2   Thyroid Function Tests: Recent Labs    12/02/20 1218  TSH 1.564   Anemia Panel: No results for input(s): VITAMINB12, FOLATE, FERRITIN, TIBC, IRON, RETICCTPCT in the last 72 hours. Sepsis Labs: No results for input(s): PROCALCITON, LATICACIDVEN in the last 168 hours.  Recent  Results (from the past 240 hour(s))  Resp Panel by RT-PCR (Flu A&B, Covid) Nasopharyngeal Swab     Status: None   Collection Time: 12/01/20 10:15 PM   Specimen: Nasopharyngeal Swab; Nasopharyngeal(NP) swabs in vial transport medium  Result Value Ref Range Status   SARS Coronavirus 2 by RT PCR NEGATIVE NEGATIVE Final    Comment: (NOTE) SARS-CoV-2 target  nucleic acids are NOT DETECTED.  The SARS-CoV-2 RNA is generally detectable in upper respiratory specimens during the acute phase of infection. The lowest concentration of SARS-CoV-2 viral copies this assay can detect is 138 copies/mL. A negative result does not preclude SARS-Cov-2 infection and should not be used as the sole basis for treatment or other patient management decisions. A negative result may occur with  improper specimen collection/handling, submission of specimen other than nasopharyngeal swab, presence of viral mutation(s) within the areas targeted by this assay, and inadequate number of viral copies(<138 copies/mL). A negative result must be combined with clinical observations, patient history, and epidemiological information. The expected result is Negative.  Fact Sheet for Patients:  EntrepreneurPulse.com.au  Fact Sheet for Healthcare Providers:  IncredibleEmployment.be  This test is no t yet approved or cleared by the [place] FDA and  has been authorized for detection and/or diagnosis of SARS-CoV-2 by FDA under an Emergency Use Authorization (EUA). This EUA will remain  in effect (meaning this test can be used) for the duration of the COVID-19 declaration under Section 564(b)(1) of the Act, 21 U.S.C.section 360bbb-3(b)(1), unless the authorization is terminated  or revoked sooner.       Influenza A by PCR NEGATIVE NEGATIVE Final   Influenza B by PCR NEGATIVE NEGATIVE Final    Comment: (NOTE) The Xpert Xpress SARS-CoV-2/FLU/RSV plus assay is intended as an aid in the  diagnosis of influenza from Nasopharyngeal swab specimens and should not be used as a sole basis for treatment. Nasal washings and aspirates are unacceptable for Xpert Xpress SARS-CoV-2/FLU/RSV testing.  Fact Sheet for Patients: EntrepreneurPulse.com.au  Fact Sheet for Healthcare Providers: IncredibleEmployment.be  This test is not yet approved or cleared by the [place] FDA and has been authorized for detection and/or diagnosis of SARS-CoV-2 by FDA under an Emergency Use Authorization (EUA). This EUA will remain in effect (meaning this test can be used) for the duration of the COVID-19 declaration under Section 564(b)(1) of the Act, 21 U.S.C. section 360bbb-3(b)(1), unless the authorization is terminated or revoked.  Performed at [facility], [place] [address], [place], [place] [address]       Radiology Studies: CT HEAD WO CONTRAST (5MM)  Result Date: 12/01/2020 CLINICAL DATA:  Dizziness, left-sided weakness EXAM: CT HEAD WITHOUT CONTRAST TECHNIQUE: Contiguous axial images were obtained from the base of the skull through the vertex without intravenous contrast. COMPARISON:  09/29/2009 FINDINGS: Brain: No evidence of acute infarction, hemorrhage, hydrocephalus, extra-axial collection or mass lesion/mass effect. Vascular: No hyperdense vessel or unexpected calcification. Skull: Normal. Negative for fracture or focal lesion. Sinuses/Orbits: The visualized paranasal sinuses are essentially clear. The mastoid air cells are unopacified. Other: None. IMPRESSION: Normal head CT. Electronically Signed   By: Julian Hy M.D.   On: 12/01/2020 22:43   MR ANGIO HEAD WO CONTRAST  Result Date: 12/02/2020 CLINICAL DATA:  Vertebral artery aneurysm; dizziness, nonspecific EXAM: MRI HEAD WITHOUT CONTRAST MRA HEAD WITHOUT CONTRAST MRA NECK WITHOUT AND WITH CONTRAST TECHNIQUE: Multiplanar, multi-echo pulse sequences of the brain and  surrounding structures were acquired without intravenous contrast. Angiographic images of the Circle of Willis were acquired using MRA technique without intravenous contrast. Angiographic images of the neck were acquired using MRA technique without and with intravenous contrast. Carotid stenosis measurements (when applicable) are obtained utilizing NASCET criteria, using the distal internal carotid diameter as the denominator. CONTRAST:  38m GADAVIST GADOBUTROL 1 MMOL/ML IV SOLN COMPARISON:  Prior MRI brain is from 2010. FINDINGS: MRI HEAD Brain:  There is no acute infarction or intracranial hemorrhage. There is no intracranial mass, mass effect, or edema. There is no hydrocephalus or extra-axial fluid collection. Ventricles and sulci are normal in size and configuration. Vascular: Major vessel flow voids at the skull base are preserved. Skull and upper cervical spine: Normal marrow signal is preserved. Sinuses/Orbits: Paranasal sinuses are aerated. Orbits are unremarkable. Other: Sella is unremarkable.  Mastoid air cells are clear. MRA HEAD Intracranial internal carotid arteries are patent. Middle and anterior cerebral arteries are patent. Intracranial vertebral arteries, basilar artery, posterior cerebral arteries are patent. Bilateral posterior communicating arteries are present. Fetal origin of the right PCA. There is no significant stenosis. There is a 1.5 mm superiorly directed outpouching at the expected location of the right P1 PCA. MRA NECK Great vessel origins are patent. Common, internal, and external carotid arteries are patent. Extracranial right vertebral artery is patent. 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 IMPRESSION: No acute infarction, hemorrhage, or mass. No large vessel occlusion, hemodynamically significant stenosis, or evidence of dissection. A 1.5 mm superiorly directed outpouching at the expected location of the right P1 PCA may  reflect a small aneurysm or infundibulum. Electronically Signed   By: Macy Mis M.D.   On: 12/02/2020 12:14   MR ANGIO NECK W WO CONTRAST  Result Date: 12/02/2020 CLINICAL DATA:  Vertebral artery aneurysm; dizziness, nonspecific EXAM: MRI HEAD WITHOUT CONTRAST MRA HEAD WITHOUT CONTRAST MRA NECK WITHOUT AND WITH CONTRAST TECHNIQUE: Multiplanar, multi-echo pulse sequences of the brain and surrounding structures were acquired without intravenous contrast. Angiographic images of the Circle of Willis were acquired using MRA technique without intravenous contrast. Angiographic images of the neck were acquired using MRA technique without and with intravenous contrast. Carotid stenosis measurements (when applicable) are obtained utilizing NASCET criteria, using the distal internal carotid diameter as the denominator. CONTRAST:  52m GADAVIST GADOBUTROL 1 MMOL/ML IV SOLN COMPARISON:  Prior MRI brain is from 2010. FINDINGS: MRI HEAD Brain: There is no acute infarction or intracranial hemorrhage. There is no intracranial mass, mass effect, or edema. There is no hydrocephalus or extra-axial fluid collection. Ventricles and sulci are normal in size and configuration. Vascular: Major vessel flow voids at the skull base are preserved. Skull and upper cervical spine: Normal marrow signal is preserved. Sinuses/Orbits: Paranasal sinuses are aerated. Orbits are unremarkable. Other: Sella is unremarkable.  Mastoid air cells are clear. MRA HEAD Intracranial internal carotid arteries are patent. Middle and anterior cerebral arteries are patent. Intracranial vertebral arteries, basilar artery, posterior cerebral arteries are patent. Bilateral posterior communicating arteries are present. Fetal origin of the right PCA. There is no significant stenosis. There is a 1.5 mm superiorly directed outpouching at the expected location of the right P1 PCA. MRA NECK Great vessel origins are patent. Common, internal, and external carotid  arteries are patent. Extracranial right vertebral artery is patent. 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 IMPRESSION: No acute infarction, hemorrhage, or mass. No large vessel occlusion, hemodynamically significant stenosis, or evidence of dissection. A 1.5 mm superiorly directed outpouching at the expected location of the right P1 PCA may reflect a small aneurysm or infundibulum. Electronically Signed   By: PMacy MisM.D.   On: 12/02/2020 12:14   MR BRAIN WO CONTRAST  Result Date: 12/02/2020 CLINICAL DATA:  Vertebral artery aneurysm; dizziness, nonspecific EXAM: MRI HEAD WITHOUT CONTRAST MRA HEAD WITHOUT CONTRAST MRA NECK WITHOUT AND  WITH CONTRAST TECHNIQUE: Multiplanar, multi-echo pulse sequences of the brain and surrounding structures were acquired without intravenous contrast. Angiographic images of the Circle of Willis were acquired using MRA technique without intravenous contrast. Angiographic images of the neck were acquired using MRA technique without and with intravenous contrast. Carotid stenosis measurements (when applicable) are obtained utilizing NASCET criteria, using the distal internal carotid diameter as the denominator. CONTRAST:  76m GADAVIST GADOBUTROL 1 MMOL/ML IV SOLN COMPARISON:  Prior MRI brain is from 2010. FINDINGS: MRI HEAD Brain: There is no acute infarction or intracranial hemorrhage. There is no intracranial mass, mass effect, or edema. There is no hydrocephalus or extra-axial fluid collection. Ventricles and sulci are normal in size and configuration. Vascular: Major vessel flow voids at the skull base are preserved. Skull and upper cervical spine: Normal marrow signal is preserved. Sinuses/Orbits: Paranasal sinuses are aerated. Orbits are unremarkable. Other: Sella is unremarkable.  Mastoid air cells are clear. MRA HEAD Intracranial internal carotid arteries are patent. Middle and anterior cerebral arteries are  patent. Intracranial vertebral arteries, basilar artery, posterior cerebral arteries are patent. Bilateral posterior communicating arteries are present. Fetal origin of the right PCA. There is no significant stenosis. There is a 1.5 mm superiorly directed outpouching at the expected location of the right P1 PCA. MRA NECK Great vessel origins are patent. Common, internal, and external carotid arteries are patent. Extracranial right vertebral artery is patent. 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 IMPRESSION: No acute infarction, hemorrhage, or mass. No large vessel occlusion, hemodynamically significant stenosis, or evidence of dissection. A 1.5 mm superiorly directed outpouching at the expected location of the right P1 PCA may reflect a small aneurysm or infundibulum. Electronically Signed   By: PMacy MisM.D.   On: 12/02/2020 12:14      Scheduled Meds:  aspirin  81 mg Oral Once   atorvastatin  40 mg Oral Daily   enoxaparin (LOVENOX) injection  40 mg Subcutaneous Q24H   gabapentin  300 mg Oral TID   levothyroxine  25 mcg Oral QAC breakfast   omega-3 acid ethyl esters  1 g Oral BID   pantoprazole  40 mg Oral Daily   QUEtiapine  800 mg Oral QHS   venlafaxine XR  225 mg Oral Q breakfast   Continuous Infusions:  sodium chloride 75 mL/hr at 12/03/20 0533     LOS: 1 day      Time spent: 30 minutes   JDessa Phi DO [facility] 12/03/2020, 12:29 PM   Available via Epic secure chat 7am-7pm After these hours, please refer to coverage provider listed on [email or web address]

## 2020-12-04 DIAGNOSIS — I951 Orthostatic hypotension: Principal | ICD-10-CM

## 2020-12-04 MED ORDER — MECLIZINE HCL 25 MG PO TABS: 25.0000 mg | ORAL_TABLET | Freq: Three times a day (TID) | ORAL | 0 refills | Status: DC | PRN

## 2020-12-04 NOTE — Discharge Summary (Signed)
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  Discharge Condition: Stable CODE STATUS: Full  Diet recommendation:  Diet Orders (From admission, onward)     Start     Ordered   12/02/20 1424  Diet regular Room service appropriate? Yes; Fluid consistency: Thin  Diet effective now       Question Answer Comment  Room service appropriate? Yes   Fluid consistency: Thin      12/02/20 1423           Brief/Interim Summary: Leslie GASPARIANis a 60y.o. female with medical history significant of anxiety, fibromyalgia, chronic headache, obesity, GERD presents with complaint of dizziness. Patient reports that her symptoms started after dinner.  She was lying down in her bed for couple of hours after eating dinner at 6:00.  She was doing well at that time.  Reports that when she tried to get out of bed at 9:00 she had difficulty walking due to severe dizziness.  She felt like she was falling backward. Patient was seen by teleneurology who recommended MRI brain, MRA head and neck and PT OT consult.  [facility] consulted for admission for further evaluation and management of her persistent dizziness. MRA head and neck with [id number] at right PCA.  Discussed with neurology over the phone, insignificant finding. Patient was found to be positive for orthostatic hypotension, patient was started on IV fluid with improvement in repeat orthostatic vital signs.  On day of discharge, patient was feeling much better, ready to go home.  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  Discussed with neurology over the phone, insignificant finding.  Follow-up with repeat imaging as outpatient -Repeat orthostatic vital sign negative -PT OT evaluated patient, no PT OT Follow up -Trial Antivert for vertigo  Hypothyroidism -Continue Synthroid  Fibromyalgia -Continue gabapentin    Hyperlipidemia -Continue lipitor   Depression/anxiety -Continue Seroquel, Effexor, Xanax  Discharge Instructions  Discharge Instructions     Call MD for:  difficulty breathing, headache or visual disturbances   Complete by: As directed    Call MD for:  extreme fatigue   Complete by: As directed    Call MD for:  persistant dizziness or light-headedness   Complete by: As directed    Call MD for:  persistant nausea and vomiting   Complete by: As directed    Call MD for:  severe uncontrolled pain   Complete by: As directed    Call MD for:  temperature >100.4   Complete by: As directed    Discharge instructions   Complete by: As directed    You were cared for by a hospitalist during your hospital stay. If you have any questions about your discharge medications or the care you received while you were in the hospital after you are discharged, you can call the unit and ask to speak with the hospitalist  on call if the hospitalist that took care of you is not available. Once you are discharged, your primary care physician will handle any further medical issues. Please note that NO REFILLS for any discharge medications will be authorized once you are discharged, as it is imperative that you return to your primary care physician (or establish a relationship with a primary care physician if you do not have one) for your aftercare needs so that they can reassess your  need for medications and monitor your lab values.   Increase activity slowly   Complete by: As directed       Allergies as of 12/04/2020       Reactions   Codeine Nausea And Vomiting   Compazine [prochlorperazine Edisylate]    "feels weird"   Sulfa Antibiotics    Unknown reaction.        Medication List     STOP taking these medications    Pfizer-BioNT COVID-19 Vac-TriS Susp injection Generic drug: COVID-19 mRNA Vac-TriS Therapist, music)       TAKE these medications    ALPRAZolam 1 MG tablet Commonly known as: XANAX TAKE 2 TABLETS BY MOUTH THREE TIMES DAILY AS NEEDED FOR ANXIETY What changed: See the new instructions.   atorvastatin 40 MG tablet Commonly known as: LIPITOR Take 1 tablet (40 mg total) by mouth daily. Patient is as needed follow up with Dr Marlou Porch, further refills will need to be authorized by patients PCP   gabapentin 300 MG capsule Commonly known as: Neurontin Take 3 capsules (900 mg total) by mouth 3 (three) times daily. What changed: how much to take   levothyroxine 25 MCG tablet Commonly known as: SYNTHROID Take 1 tablet (25 mcg total) by mouth daily before breakfast.   meclizine 25 MG tablet Commonly known as: ANTIVERT Take 1 tablet (25 mg total) by mouth 3 (three) times daily as needed for dizziness.   omega-3 acid ethyl esters 1 g capsule Commonly known as: LOVAZA Take 1 g by mouth 2 (two) times daily.   omeprazole 20 MG capsule Commonly known as: PRILOSEC TAKE 1 CAPSULE EVERY DAY What changed:  when to take this reasons to take this   OVER THE COUNTER MEDICATION Take 1 tablet by mouth daily. Fiber Well   QUEtiapine 400 MG tablet Commonly known as: SEROQUEL Take 800 mg by mouth at bedtime.   venlafaxine XR 75 MG 24 hr capsule Commonly known as: EFFEXOR-XR TAKE 3 CAPSULES EVERY DAY What changed:  how much to take how to take this when to take this additional instructions        Follow-up Information     [name],  NP. Schedule an appointment as soon as possible for a visit in 1 week(s).   Specialty: Family Medicine Contact information: [place] [address] [phone]                Allergies  Allergen Reactions   Codeine Nausea And Vomiting   Compazine [Prochlorperazine Edisylate]     "feels weird"   Sulfa Antibiotics     Unknown reaction.    Consultations: None    Procedures/Studies: CT HEAD WO CONTRAST (5MM)  Result Date: 12/01/2020 CLINICAL DATA:  Dizziness, left-sided weakness EXAM: CT HEAD WITHOUT CONTRAST TECHNIQUE: Contiguous axial images were obtained from the base of the skull through the vertex without intravenous contrast. COMPARISON:  09/29/2009 FINDINGS: Brain: No evidence of acute infarction, hemorrhage, hydrocephalus, extra-axial collection or mass lesion/mass effect. Vascular: No hyperdense vessel or unexpected  calcification. Skull: Normal. Negative for fracture or focal lesion. Sinuses/Orbits: The visualized paranasal sinuses are essentially clear. The mastoid air cells are unopacified. Other: None. IMPRESSION: Normal head CT. Electronically Signed   By: Julian Hy M.D.   On: 12/01/2020 22:43   MR ANGIO HEAD WO CONTRAST  Result Date: 12/02/2020 CLINICAL DATA:  Vertebral artery aneurysm; dizziness, nonspecific EXAM: MRI HEAD WITHOUT CONTRAST MRA HEAD WITHOUT CONTRAST MRA NECK WITHOUT AND WITH CONTRAST TECHNIQUE: Multiplanar, multi-echo pulse sequences of the brain and surrounding structures were acquired without intravenous contrast. Angiographic images of the Circle of Willis were acquired using MRA technique without intravenous contrast. Angiographic images of the neck were acquired using MRA technique without and with intravenous contrast. Carotid stenosis measurements (when applicable) are obtained utilizing NASCET criteria, using the distal internal carotid diameter as the denominator. CONTRAST:  70m GADAVIST GADOBUTROL 1 MMOL/ML IV SOLN  COMPARISON:  Prior MRI brain is from 2010. FINDINGS: MRI HEAD Brain: There is no acute infarction or intracranial hemorrhage. There is no intracranial mass, mass effect, or edema. There is no hydrocephalus or extra-axial fluid collection. Ventricles and sulci are normal in size and configuration. Vascular: Major vessel flow voids at the skull base are preserved. Skull and upper cervical spine: Normal marrow signal is preserved. Sinuses/Orbits: Paranasal sinuses are aerated. Orbits are unremarkable. Other: Sella is unremarkable.  Mastoid air cells are clear. MRA HEAD Intracranial internal carotid arteries are patent. Middle and anterior cerebral arteries are patent. Intracranial vertebral arteries, basilar artery, posterior cerebral arteries are patent. Bilateral posterior communicating arteries are present. Fetal origin of the right PCA. There is no significant stenosis. There is a 1.5 mm superiorly directed outpouching at the expected location of the right P1 PCA. MRA NECK Great vessel origins are patent. Common, internal, and external carotid arteries are patent. Extracranial right vertebral artery is patent. 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 IMPRESSION: No acute infarction, hemorrhage, or mass. No large vessel occlusion, hemodynamically significant stenosis, or evidence of dissection. A 1.5 mm superiorly directed outpouching at the expected location of the right P1 PCA may reflect a small aneurysm or infundibulum. Electronically Signed   By: PMacy MisM.D.   On: 12/02/2020 12:14   MR ANGIO NECK W WO CONTRAST  Result Date: 12/02/2020 CLINICAL DATA:  Vertebral artery aneurysm; dizziness, nonspecific EXAM: MRI HEAD WITHOUT CONTRAST MRA HEAD WITHOUT CONTRAST MRA NECK WITHOUT AND WITH CONTRAST TECHNIQUE: Multiplanar, multi-echo pulse sequences of the brain and surrounding structures were acquired without intravenous contrast. Angiographic  images of the Circle of Willis were acquired using MRA technique without intravenous contrast. Angiographic images of the neck were acquired using MRA technique without and with intravenous contrast. Carotid stenosis measurements (when applicable) are obtained utilizing NASCET criteria, using the distal internal carotid diameter as the denominator. CONTRAST:  935mGADAVIST GADOBUTROL 1 MMOL/ML IV SOLN COMPARISON:  Prior MRI brain is from 2010. FINDINGS: MRI HEAD Brain: There is no acute infarction or intracranial hemorrhage. There is no intracranial mass, mass effect, or edema. There is no hydrocephalus or extra-axial fluid collection. Ventricles and sulci are normal in size and configuration. Vascular: Major vessel flow voids at the skull base are preserved. Skull and upper cervical spine: Normal marrow signal is preserved. Sinuses/Orbits: Paranasal sinuses are aerated. Orbits are unremarkable. Other: Sella is unremarkable.  Mastoid air cells are clear. MRA HEAD Intracranial internal carotid arteries are patent. Middle and anterior cerebral arteries are patent. Intracranial vertebral arteries,  basilar artery, posterior cerebral arteries are patent. Bilateral posterior communicating arteries are present. Fetal origin of the right PCA. There is no significant stenosis. There is a 1.5 mm superiorly directed outpouching at the expected location of the right P1 PCA. MRA NECK Great vessel origins are patent. Common, internal, and external carotid arteries are patent. Extracranial right vertebral artery is patent. 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 IMPRESSION: No acute infarction, hemorrhage, or mass. No large vessel occlusion, hemodynamically significant stenosis, or evidence of dissection. A 1.5 mm superiorly directed outpouching at the expected location of the right P1 PCA may reflect a small aneurysm or infundibulum. Electronically Signed   By: Macy Mis M.D.   On: 12/02/2020 12:14   MR BRAIN WO CONTRAST  Result Date: 12/02/2020 CLINICAL DATA:  Vertebral artery aneurysm; dizziness, nonspecific EXAM: MRI HEAD WITHOUT CONTRAST MRA HEAD WITHOUT CONTRAST MRA NECK WITHOUT AND WITH CONTRAST TECHNIQUE: Multiplanar, multi-echo pulse sequences of the brain and surrounding structures were acquired without intravenous contrast. Angiographic images of the Circle of Willis were acquired using MRA technique without intravenous contrast. Angiographic images of the neck were acquired using MRA technique without and with intravenous contrast. Carotid stenosis measurements (when applicable) are obtained utilizing NASCET criteria, using the distal internal carotid diameter as the denominator. CONTRAST:  72m GADAVIST GADOBUTROL 1 MMOL/ML IV SOLN COMPARISON:  Prior MRI brain is from 2010. FINDINGS: MRI HEAD Brain: There is no acute infarction or intracranial hemorrhage. There is no intracranial mass, mass effect, or edema. There is no hydrocephalus or extra-axial fluid collection. Ventricles and sulci are normal in size and configuration. Vascular: Major vessel flow voids at the skull base are preserved. Skull and upper cervical spine: Normal marrow signal is preserved. Sinuses/Orbits: Paranasal sinuses are aerated. Orbits are unremarkable. Other: Sella is unremarkable.  Mastoid air cells are clear. MRA HEAD Intracranial internal carotid arteries are patent. Middle and anterior cerebral arteries are patent. Intracranial vertebral arteries, basilar artery, posterior cerebral arteries are patent. Bilateral posterior communicating arteries are present. Fetal origin of the right PCA. There is no significant stenosis. There is a 1.5 mm superiorly directed outpouching at the expected location of the right P1 PCA. MRA NECK Great vessel origins are patent. Common, internal, and external carotid arteries are patent. Extracranial right vertebral artery is patent. Left vertebral artery  is likely congenitally small in caliber and the origin is not well evaluated. IMPRESSION: No acute infarction, hemorrhage, or mass. No large vessel occlusion, hemodynamically significant stenosis, or evidence of dissection. A 1.5 mm superiorly directed outpouching at the expected location of the right P1 PCA may reflect a small aneurysm or infundibulum. Electronically Signed   By: PMacy MisM.D.   On: 12/02/2020 12:14       Discharge Exam: Vitals:   12/04/20 0914 12/04/20 0915  BP: 116/76 117/64  Pulse: 88 95  Resp:    Temp:    SpO2: 95% 96%    General: Pt is alert, awake, not in acute distress Cardiovascular: RRR, S1/S2 +, no edema Respiratory: CTA bilaterally, no wheezing, no rhonchi, no respiratory distress, no conversational dyspnea  Abdominal: Soft, NT, ND, bowel sounds + Extremities: no edema, no cyanosis Psych: Normal mood and affect, stable judgement and insight     The results of significant diagnostics from this hospitalization (including imaging, microbiology, ancillary and laboratory) are listed below for reference.     Microbiology: Recent Results (from the past 240 hour(s))  Resp Panel by RT-PCR (Flu A&B, Covid) Nasopharyngeal Swab     Status: None   Collection Time: 12/01/20 10:15 PM   Specimen: Nasopharyngeal Swab; Nasopharyngeal(NP) swabs in vial transport medium  Result Value Ref Range Status   SARS Coronavirus 2 by RT PCR NEGATIVE NEGATIVE Final    Comment: (NOTE) SARS-CoV-2 target nucleic acids are NOT DETECTED.  The SARS-CoV-2 RNA is generally detectable in upper respiratory specimens during the acute phase of infection. The lowest concentration of SARS-CoV-2 viral copies this assay can detect is 138 copies/mL. A negative result does not preclude SARS-Cov-2 infection and should not be used as the sole basis for treatment or other patient management decisions. A negative result may occur with  improper specimen collection/handling, submission of  specimen other than nasopharyngeal swab, presence of viral mutation(s) within the areas targeted by this assay, and inadequate number of viral copies(<138 copies/mL). A negative result must be combined with clinical observations, patient history, and epidemiological information. The expected result is Negative.  Fact Sheet for Patients:  EntrepreneurPulse.com.au  Fact Sheet for Healthcare Providers:  IncredibleEmployment.be  This test is no t yet approved or cleared by the [place] FDA and  has been authorized for detection and/or diagnosis of SARS-CoV-2 by FDA under an Emergency Use Authorization (EUA). This EUA will remain  in effect (meaning this test can be used) for the duration of the COVID-19 declaration under Section 564(b)(1) of the Act, 21 U.S.C.section 360bbb-3(b)(1), unless the authorization is terminated  or revoked sooner.       Influenza A by PCR NEGATIVE NEGATIVE Final   Influenza B by PCR NEGATIVE NEGATIVE Final    Comment: (NOTE) The Xpert Xpress SARS-CoV-2/FLU/RSV plus assay is intended as an aid in the diagnosis of influenza from Nasopharyngeal swab specimens and should not be used as a sole basis for treatment. Nasal washings and aspirates are unacceptable for Xpert Xpress SARS-CoV-2/FLU/RSV testing.  Fact Sheet for Patients: EntrepreneurPulse.com.au  Fact Sheet for Healthcare Providers: IncredibleEmployment.be  This test is not yet approved or cleared by the [place] FDA and has been authorized for detection and/or diagnosis of SARS-CoV-2 by FDA under an Emergency Use Authorization (EUA). This EUA will remain in effect (meaning this test can be used) for the duration of the COVID-19 declaration under Section 564(b)(1) of the Act, 21 U.S.C. section 360bbb-3(b)(1), unless the authorization is terminated or revoked.  Performed at [facility], [place]  [address] [place], [place] [address]      Labs: BNP (last 3 results) No results for input(s): BNP in the last 8760 hours. Basic Metabolic Panel: Recent Labs  Lab 11/30/20 1334 12/01/20 2215 12/01/20 2226 12/02/20 1218 12/03/20 0443  NA 138 137 138  --  139  K 4.5 4.2 4.5  --  3.8  CL 102 102 101  --  107  CO2 27 24  --   --  24  GLUCOSE 89 116* 120*  --  104*  BUN '12 17 19  '$ --  10  CREATININE 0.91 0.89 0.80 0.76 0.80  CALCIUM 9.8 9.1  --   --  8.8*  MG  --   --   --  2.4  --    Liver Function Tests: Recent Labs  Lab 11/30/20 1334 12/01/20 2215 12/03/20 0443  AST 24 43* 21  ALT 35 44 33  ALKPHOS 121* 105 94  BILITOT 0.3 0.4 0.4  PROT 7.0 6.9 6.3*  ALBUMIN 4.5 4.3 3.9   No results for  input(s): LIPASE, AMYLASE in the last 168 hours. No results for input(s): AMMONIA in the last 168 hours. CBC: Recent Labs  Lab 11/30/20 1334 12/01/20 2215 12/01/20 2226 12/02/20 1218 12/03/20 0443  WBC 5.4 7.3  --  5.0 5.3  NEUTROABS 2.3 2.5  --   --   --   HGB 13.5 12.8 12.6 13.8 12.1  HCT 40.7 40.2 37.0 43.0 37.9  MCV 92.4 95.7  --  96.6 96.9  PLT 250.0 217  --  216 215   Cardiac Enzymes: No results for input(s): CKTOTAL, CKMB, CKMBINDEX, TROPONINI in the last 168 hours. BNP: Invalid input(s): POCBNP CBG: Recent Labs  Lab 12/01/20 2213  GLUCAP 110*   D-Dimer No results for input(s): DDIMER in the last 72 hours. Hgb A1c No results for input(s): HGBA1C in the last 72 hours. Lipid Profile No results for input(s): CHOL, HDL, LDLCALC, TRIG, CHOLHDL, LDLDIRECT in the last 72 hours. Thyroid function studies Recent Labs    12/02/20 1218  TSH 1.564   Anemia work up No results for input(s): VITAMINB12, FOLATE, FERRITIN, TIBC, IRON, RETICCTPCT in the last 72 hours. Urinalysis    Component Value Date/Time   COLORURINE STRAW (A) 12/02/2020 0200   APPEARANCEUR CLEAR 12/02/2020 0200   LABSPEC 1.009 12/02/2020 0200   PHURINE 7.0 12/02/2020 0200   GLUCOSEU NEGATIVE  12/02/2020 0200   HGBUR NEGATIVE 12/02/2020 0200   BILIRUBINUR NEGATIVE 12/02/2020 0200   BILIRUBINUR neg 11/11/2015 1700   KETONESUR NEGATIVE 12/02/2020 0200   PROTEINUR NEGATIVE 12/02/2020 0200   UROBILINOGEN 0.2 11/11/2015 1700   UROBILINOGEN 0.2 06/28/2010 0054   NITRITE NEGATIVE 12/02/2020 0200   LEUKOCYTESUR NEGATIVE 12/02/2020 0200   Sepsis Labs Invalid input(s): PROCALCITONIN,  WBC,  LACTICIDVEN Microbiology Recent Results (from the past 240 hour(s))  Resp Panel by RT-PCR (Flu A&B, Covid) Nasopharyngeal Swab     Status: None   Collection Time: 12/01/20 10:15 PM   Specimen: Nasopharyngeal Swab; Nasopharyngeal(NP) swabs in vial transport medium  Result Value Ref Range Status   SARS Coronavirus 2 by RT PCR NEGATIVE NEGATIVE Final    Comment: (NOTE) SARS-CoV-2 target nucleic acids are NOT DETECTED.  The SARS-CoV-2 RNA is generally detectable in upper respiratory specimens during the acute phase of infection. The lowest concentration of SARS-CoV-2 viral copies this assay can detect is 138 copies/mL. A negative result does not preclude SARS-Cov-2 infection and should not be used as the sole basis for treatment or other patient management decisions. A negative result may occur with  improper specimen collection/handling, submission of specimen other than nasopharyngeal swab, presence of viral mutation(s) within the areas targeted by this assay, and inadequate number of viral copies(<138 copies/mL). A negative result must be combined with clinical observations, patient history, and epidemiological information. The expected result is Negative.  Fact Sheet for Patients:  EntrepreneurPulse.com.au  Fact Sheet for Healthcare Providers:  IncredibleEmployment.be  This test is no t yet approved or cleared by the [place] FDA and  has been authorized for detection and/or diagnosis of SARS-CoV-2 by FDA under an Emergency Use Authorization  (EUA). This EUA will remain  in effect (meaning this test can be used) for the duration of the COVID-19 declaration under Section 564(b)(1) of the Act, 21 U.S.C.section 360bbb-3(b)(1), unless the authorization is terminated  or revoked sooner.       Influenza A by PCR NEGATIVE NEGATIVE Final   Influenza B by PCR NEGATIVE NEGATIVE Final    Comment: (NOTE) The Xpert Xpress SARS-CoV-2/FLU/RSV plus assay is intended  as an aid in the diagnosis of influenza from Nasopharyngeal swab specimens and should not be used as a sole basis for treatment. Nasal washings and aspirates are unacceptable for Xpert Xpress SARS-CoV-2/FLU/RSV testing.  Fact Sheet for Patients: EntrepreneurPulse.com.au  Fact Sheet for Healthcare Providers: IncredibleEmployment.be  This test is not yet approved or cleared by the [place] FDA and has been authorized for detection and/or diagnosis of SARS-CoV-2 by FDA under an Emergency Use Authorization (EUA). This EUA will remain in effect (meaning this test can be used) for the duration of the COVID-19 declaration under Section 564(b)(1) of the Act, 21 U.S.C. section 360bbb-3(b)(1), unless the authorization is terminated or revoked.  Performed at [facility], [place] [address], [place], [place] [address]      Patient was seen and examined on the day of discharge and was found to be in stable condition. Time coordinating discharge: 25 minutes including assessment and coordination of care, as well as examination of the patient.   SIGNED:  Dessa Phi, DO [facility] 12/04/2020, 10:11 AM

## 2020-12-04 NOTE — Plan of Care (Signed)
Instructions were reviewed with patient. All questions were answered. Patient was transported to main entrance by wheelchair. ° °

## 2020-12-06 MED ORDER — ALPRAZOLAM 1 MG PO TABS: ORAL_TABLET | ORAL | 0 refills | Status: DC

## 2020-12-07 ENCOUNTER — Telehealth: Payer: Self-pay

## 2020-12-07 NOTE — Telephone Encounter (Signed)
Transition Care Management Unsuccessful Follow-up Telephone Call  Date of discharge and from where:  12/04/2020 [facility]   Attempts:  2nd Attempt  Reason for unsuccessful TCM follow-up call:  No answer/busy

## 2020-12-08 ENCOUNTER — Other Ambulatory Visit: Payer: Self-pay

## 2020-12-09 ENCOUNTER — Encounter: Payer: Self-pay | Admitting: Adult Health

## 2020-12-09 ENCOUNTER — Ambulatory Visit (INDEPENDENT_AMBULATORY_CARE_PROVIDER_SITE_OTHER): Payer: Medicare HMO | Admitting: Adult Health

## 2020-12-09 VITALS — BP 100/80 | HR 92 | Temp 98.6°F | Ht 64.0 in | Wt 196.0 lb

## 2020-12-09 DIAGNOSIS — I951 Orthostatic hypotension: Secondary | ICD-10-CM | POA: Diagnosis not present

## 2020-12-09 DIAGNOSIS — R42 Dizziness and giddiness: Secondary | ICD-10-CM | POA: Diagnosis not present

## 2020-12-09 DIAGNOSIS — F419 Anxiety disorder, unspecified: Secondary | ICD-10-CM

## 2020-12-09 DIAGNOSIS — E785 Hyperlipidemia, unspecified: Secondary | ICD-10-CM | POA: Diagnosis not present

## 2020-12-09 DIAGNOSIS — E038 Other specified hypothyroidism: Secondary | ICD-10-CM

## 2020-12-09 DIAGNOSIS — I671 Cerebral aneurysm, nonruptured: Secondary | ICD-10-CM

## 2020-12-09 DIAGNOSIS — E1169 Type 2 diabetes mellitus with other specified complication: Secondary | ICD-10-CM | POA: Diagnosis not present

## 2020-12-09 DIAGNOSIS — F32A Depression, unspecified: Secondary | ICD-10-CM

## 2020-12-09 NOTE — Progress Notes (Signed)
Subjective:    Patient ID: Leslie Benson, female    DOB: December 30, 1960, 60 y.o.   MRN: [id number]  HPI 60 year old female who  has a past medical history of ADHD, Allergy, Anemia, Anxiety, Barrett esophagus, Breast nodule, Cervical arthritis (09/22/2013), Depression, Elevated liver enzymes, Fatty liver, Fibromyalgia, Fibromyalgia, GERD (gastroesophageal reflux disease), H/O hiatal hernia, Headache(784.0), High cholesterol, History of UTI, Lymphocytic colitis, Migraines, Pain, Panic attack, Paraesophageal hiatal hernia, Spinal stenosis, Thyroid disease, and Thyroid disease.  She presents to the office today for TCM visit   Admit Date 12/01/2020 Discharge Date 12/04/2020  She presented to the emergency room with a complaint of dizziness.  She was laying down in her bed for a couple hours after eating dinner at [facility] she was doing well at that time, when she tried to get out of bed at 9 PM she had difficulty walking due to severe dizziness.  She felt like she was falling backwards.  She was admitted for evaluation and management of persistent dizziness.  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  This was discussed with neurology who felt that it was insignificant finding.  She was found to be positive for orthostatic hypotension, she was started on IV fluids with improvement in repeat orthostatic vitals.  Day of discharge she was feeling much better and ready to go home.  Hospital Course  Orthostatic hypotension -RI of the brain negative for stroke.  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  Repeat imaging in 1 year as outpatient.  She was prescribed Antivert for vertigo.  Hypothyroidism  -Continue Synthroid  Fibromyalgia -Continue gabapentin  Hyperlipidemia  - Continue lipitor  Depression/Anxiety -Continue Seroquel, Effexor, and Xanax.  Today she reports that if she is feeling better, staying hydrated continues to have intermittent episodes of dizziness especially  with change in position.  Has found that Antivert helps.  She denies chest pain, shortness of breath, falls, or syncopal episodes.   Review of Systems  Constitutional: Negative.   HENT: Negative.    Eyes: Negative.   Cardiovascular: Negative.   Gastrointestinal: Negative.   Endocrine: Negative.   Genitourinary: Negative.   Musculoskeletal: Negative.   Allergic/Immunologic: Negative.   Neurological:  Positive for dizziness.  Hematological: Negative.   Psychiatric/Behavioral: Negative.    All other systems reviewed and are negative. Past Medical History:  Diagnosis Date   ADHD    Allergy    Anemia    past hx    Anxiety    Barrett esophagus    Breast nodule    benign   Cervical arthritis 09/22/2013   Depression    Elevated liver enzymes    Fatty liver    Fibromyalgia    Fibromyalgia    GERD (gastroesophageal reflux disease)    H/O hiatal hernia    Headache(784.0)    migraines   High cholesterol    History of UTI    Lymphocytic colitis    Migraines    Pain    Panic attack    Paraesophageal hiatal hernia    repaired 2009   Spinal stenosis    Thyroid disease    Thyroid disease     Social History   Socioeconomic History   Marital status: Married    Spouse name: Abe People   Number of children: Not on file   Years of education: Not on file   Highest education level: Not on file  Occupational History   Occupation:  disabled  Tobacco Use   Smoking status: Never   Smokeless tobacco: Never  Vaping Use   Vaping Use: Never used  Substance and Sexual Activity   Alcohol use: Not Currently    Comment: wine occassionally   Drug use: No   Sexual activity: Yes    Birth control/protection: None    Comment: n/a partial hysterectomy  Other Topics Concern   Not on file  Social History Narrative   Married    Lived in [place].  Moved to [place] in early 2000s   She is on disability for anxiety    Social Determinants of Health   Financial Resource Strain: Low Risk     Difficulty of Paying Living Expenses: Not hard at all  Food Insecurity: No Food Insecurity   Worried About Charity fundraiser in the Last Year: Never true   Arboriculturist in the Last Year: Never true  Transportation Needs: No Transportation Needs   Lack of Transportation (Medical): No   Lack of Transportation (Non-Medical): No  Physical Activity: Insufficiently Active   Days of Exercise per Week: 5 days   Minutes of Exercise per Session: 20 min  Stress: Stress Concern Present   Feeling of Stress : To some extent  Social Connections: Moderately Isolated   Frequency of Communication with Friends and Family: More than three times a week   Frequency of Social Gatherings with Friends and Family: Once a week   Attends Religious Services: Never   Marine scientist or Organizations: No   Attends Music therapist: Never   Marital Status: Married  Human resources officer Violence: Not At Risk   Fear of Current or Ex-Partner: No   Emotionally Abused: No   Physically Abused: No   Sexually Abused: No    Past Surgical History:  Procedure Laterality Date   ANKLE SURGERY     CHOLECYSTECTOMY  1980s?   COLONOSCOPY  2010   Buccini   EUS N/A 11/19/2013   Procedure: ESOPHAGEAL ENDOSCOPIC ULTRASOUND (EUS) RADIAL;  Surgeon: Arta Silence, MD;  Location: [facility];  Service: Endoscopy;  Laterality: N/A;   LAPAROSCOPIC NISSEN FUNDOPLICATION  0000000   LAPAROSCOPIC PARAESOPHAGEAL HERNIA REPAIR  03/05/2008   Dr Johney Maine   TUBAL LIGATION  1990s?   UPPER GASTROINTESTINAL ENDOSCOPY  04/05/2018   Scarlette Shorts    VAGINAL HYSTERECTOMY  03/04/2001    Family History  Problem Relation Age of Onset   Alcohol abuse Other    Elevated Lipids Other    Heart disease Other    Anxiety disorder Mother    Obesity Mother    High Cholesterol Mother    Thyroid cancer Mother    Depression Mother    Breast cancer Mother    Bone cancer Mother    High Cholesterol Father    Heart disease Father     Alcoholism Father    Colon cancer Maternal Aunt    Colon polyps Sister    Stomach cancer Neg Hx    Esophageal cancer Neg Hx    Rectal cancer Neg Hx     Allergies  Allergen Reactions   Codeine Nausea And Vomiting   Compazine [Prochlorperazine Edisylate]     "feels weird"   Sulfa Antibiotics     Unknown reaction.    Current Outpatient Medications on File Prior to Visit  Medication Sig Dispense Refill   ALPRAZolam (XANAX) 1 MG tablet TAKE 2 TABLETS BY MOUTH THREE TIMES DAILY AS NEEDED FOR ANXIETY 180 tablet 0  atorvastatin (LIPITOR) 40 MG tablet Take 1 tablet (40 mg total) by mouth daily. Patient is as needed follow up with Dr Marlou Porch, further refills will need to be authorized by patients PCP 30 tablet 0   gabapentin (NEURONTIN) 300 MG capsule Take 3 capsules (900 mg total) by mouth 3 (three) times daily. 810 capsule 1   levothyroxine (SYNTHROID) 25 MCG tablet Take 1 tablet (25 mcg total) by mouth daily before breakfast. 90 tablet 3   meclizine (ANTIVERT) 25 MG tablet Take 1 tablet (25 mg total) by mouth 3 (three) times daily as needed for dizziness. 30 tablet 0   omega-3 acid ethyl esters (LOVAZA) 1 G capsule Take 1 g by mouth 2 (two) times daily.     omeprazole (PRILOSEC) 20 MG capsule TAKE 1 CAPSULE EVERY DAY (Patient taking differently: Take 20 mg by mouth daily as needed (acid reflux).) 90 capsule 3   OVER THE COUNTER MEDICATION Take 1 tablet by mouth daily. Fiber Well     QUEtiapine (SEROQUEL) 400 MG tablet Take 800 mg by mouth at bedtime.     venlafaxine XR (EFFEXOR-XR) 75 MG 24 hr capsule TAKE 3 CAPSULES EVERY DAY (Patient taking differently: Take 75 mg by mouth in the morning, at noon, and at bedtime.) 270 capsule 0   No current facility-administered medications on file prior to visit.    BP 100/80   Pulse 92   Temp 98.6 F (37 C) (Oral)   Ht '5\' 4"'$  (1.626 m)   Wt 196 lb (88.9 kg)   SpO2 96%   BMI 33.64 kg/m       Objective:   Physical Exam Vitals and nursing note  reviewed.  Constitutional:      Appearance: Normal appearance.  Eyes:     Extraocular Movements:     Right eye: Nystagmus present.     Left eye: Nystagmus present.     Conjunctiva/sclera: Conjunctivae normal.     Comments: + dizziness with change in position  Cardiovascular:     Rate and Rhythm: Normal rate and regular rhythm.     Pulses: Normal pulses.     Heart sounds: Normal heart sounds.  Pulmonary:     Effort: Pulmonary effort is normal.     Breath sounds: Normal breath sounds.  Skin:    General: Skin is warm and dry.  Neurological:     General: No focal deficit present.     Mental Status: She is alert and oriented to person, place, and time.      Assessment & Plan:  1. Orthostatic hypotension [facility] notes, imaging, labs, and discharge instructions reviewed with the patient.  All questions answered to the best of my ability -Advised to stay as hydrated as possible. -Follow-up if needed   2. Vertigo -Instructions for home Epley maneuver given.  She can continue Antivert.  Stay hydrated.  3. Other specified hypothyroidism - Continue synthroid  4. Hyperlipidemia associated with type 2 diabetes mellitus ([place]) - Continue statin   5. Anxiety and depression - Continue current medications   6. Aneurysm of posterior cerebral artery - 1 year follow up for imaging. Order placed - MR Angiogram Head Wo Contrast; Future  [name], NP

## 2020-12-09 NOTE — Patient Instructions (Signed)
Please stay hydrated with water   Continue using Antivert for dizziness  You can also do the Epley Maneuver at home. There are youtube videos on how to perform it at home   Let me know if you need anything

## 2020-12-14 ENCOUNTER — Encounter: Payer: Self-pay | Admitting: Adult Health

## 2020-12-14 ENCOUNTER — Telehealth (INDEPENDENT_AMBULATORY_CARE_PROVIDER_SITE_OTHER): Payer: Medicare HMO | Admitting: Adult Health

## 2020-12-14 DIAGNOSIS — G4700 Insomnia, unspecified: Secondary | ICD-10-CM

## 2020-12-14 DIAGNOSIS — F411 Generalized anxiety disorder: Secondary | ICD-10-CM | POA: Diagnosis not present

## 2020-12-14 DIAGNOSIS — F410 Panic disorder [episodic paroxysmal anxiety] without agoraphobia: Secondary | ICD-10-CM | POA: Diagnosis not present

## 2020-12-14 DIAGNOSIS — F331 Major depressive disorder, recurrent, moderate: Secondary | ICD-10-CM

## 2020-12-14 MED ORDER — VENLAFAXINE HCL ER 75 MG PO CP24: ORAL_CAPSULE | ORAL | 1 refills | Status: DC

## 2020-12-14 MED ORDER — QUETIAPINE FUMARATE 400 MG PO TABS: 800.0000 mg | ORAL_TABLET | Freq: Every day | ORAL | 1 refills | Status: DC

## 2020-12-14 MED ORDER — ALPRAZOLAM 1 MG PO TABS: ORAL_TABLET | ORAL | 2 refills | Status: DC

## 2020-12-14 NOTE — Progress Notes (Signed)
[name] [id number] 03/20/1961 60 y.o.  Virtual Visit via Telephone Note  I connected with pt on 12/14/20 at  2:00 PM EDT by telephone and verified that I am speaking with the correct person using two identifiers.   I discussed the limitations, risks, security and privacy concerns of performing an evaluation and management service by telephone and the availability of in person appointments. I also discussed with the patient that there may be a patient responsible charge related to this service. The patient expressed understanding and agreed to proceed.   I discussed the assessment and treatment plan with the patient. The patient was provided an opportunity to ask questions and all were answered. The patient agreed with the plan and demonstrated an understanding of the instructions.   The patient was advised to call back or seek an in-person evaluation if the symptoms worsen or if the condition fails to improve as anticipated.  I provided 25 minutes of non-face-to-face time during this encounter.  The patient was located at home.  The provider was located at [place].   Aloha Gell, NP   Subjective:   Patient ID:  Leslie Benson is a 60 y.o. (DOB Jun 04, 1960) female.  Chief Complaint: No chief complaint on file.   HPI Leslie Benson presents for follow-up of GAD, MDD, panic attacks, and insomnia.  Describes mood today as "not too good". Pleasant. Tearful at times. Mood symptoms - denies depression, anxiety, and irritability. Stating "I just feel nervous". Reports 3 panic attacks since last visit. Recently hospitalized with Vertigo. At home recovering. Fibromyalgia "has been really bad - sore". Husband working out of town. Dog had a recent eve surgery - lost an eye. Not talking with 2 of her daughters and grandchildren - "they have me blocked". Varying interest and motivation. Taking medications as prescribed.  Energy levels low. Feels tired most of the  time. Active, does not have a regular exercise routine.   Enjoys some usual interests and activities. Married. Lives with husband of 13 years. Has 3 grown daughters - 2 grandchildren. Spending time with family. Talking to sisters. Appetite adequate. Weight 196 pounds. Sleeps well most nights. Averages 6 hours with Seroquel. Napping 2 hours during the day.  Focus and concentration difficulties. Completing tasks. Managing aspects of household. Receives SSI disability benefits for Fibromyalgia - 20 years.  Denies SI or HI.  Denies AH or VH.  Previous medication trials: Unknown   Review of Systems:  Review of Systems  Musculoskeletal:  Negative for gait problem.  Neurological:  Negative for tremors.  Psychiatric/Behavioral:         Please refer to HPI   Medications: I have reviewed the patient's current medications.  Current Outpatient Medications  Medication Sig Dispense Refill   ALPRAZolam (XANAX) 1 MG tablet TAKE 2 TABLETS BY MOUTH THREE TIMES DAILY AS NEEDED FOR ANXIETY 180 tablet 2   atorvastatin (LIPITOR) 40 MG tablet Take 1 tablet (40 mg total) by mouth daily. Patient is as needed follow up with Dr Marlou Porch, further refills will need to be authorized by patients PCP 30 tablet 0   gabapentin (NEURONTIN) 300 MG capsule Take 3 capsules (900 mg total) by mouth 3 (three) times daily. 810 capsule 1   levothyroxine (SYNTHROID) 25 MCG tablet Take 1 tablet (25 mcg total) by mouth daily before breakfast. 90 tablet 3   meclizine (ANTIVERT) 25 MG tablet Take 1 tablet (25 mg total) by mouth 3 (three) times daily as needed for dizziness. [place]  tablet 0   omega-3 acid ethyl esters (LOVAZA) 1 G capsule Take 1 g by mouth 2 (two) times daily.     omeprazole (PRILOSEC) 20 MG capsule TAKE 1 CAPSULE EVERY DAY (Patient taking differently: Take 20 mg by mouth daily as needed (acid reflux).) 90 capsule 3   OVER THE COUNTER MEDICATION Take 1 tablet by mouth daily. Fiber Well     QUEtiapine (SEROQUEL) 400 MG  tablet Take 2 tablets (800 mg total) by mouth at bedtime. 180 tablet 1   venlafaxine XR (EFFEXOR-XR) 75 MG 24 hr capsule TAKE 3 CAPSULES EVERY DAY 270 capsule 1   No current facility-administered medications for this visit.    Medication Side Effects: None  Allergies:  Allergies  Allergen Reactions   Codeine Nausea And Vomiting   Compazine [Prochlorperazine Edisylate]     "feels weird"   Sulfa Antibiotics     Unknown reaction.    Past Medical History:  Diagnosis Date   ADHD    Allergy    Anemia    past hx    Anxiety    Barrett esophagus    Breast nodule    benign   Cervical arthritis 09/22/2013   Depression    Elevated liver enzymes    Fatty liver    Fibromyalgia    Fibromyalgia    GERD (gastroesophageal reflux disease)    H/O hiatal hernia    Headache(784.0)    migraines   High cholesterol    History of UTI    Lymphocytic colitis    Migraines    Pain    Panic attack    Paraesophageal hiatal hernia    repaired 2009   Spinal stenosis    Thyroid disease    Thyroid disease     Family History  Problem Relation Age of Onset   Alcohol abuse Other    Elevated Lipids Other    Heart disease Other    Anxiety disorder Mother    Obesity Mother    High Cholesterol Mother    Thyroid cancer Mother    Depression Mother    Breast cancer Mother    Bone cancer Mother    High Cholesterol Father    Heart disease Father    Alcoholism Father    Colon cancer Maternal Aunt    Colon polyps Sister    Stomach cancer Neg Hx    Esophageal cancer Neg Hx    Rectal cancer Neg Hx     Social History   Socioeconomic History   Marital status: Married    Spouse name: Holiday representative   Number of children: Not on file   Years of education: Not on file   Highest education level: Not on file  Occupational History   Occupation: disabled  Tobacco Use   Smoking status: Never   Smokeless tobacco: Never  Vaping Use   Vaping Use: Never used  Substance and Sexual Activity   Alcohol use:  Not Currently    Comment: wine occassionally   Drug use: No   Sexual activity: Yes    Birth control/protection: None    Comment: n/a partial hysterectomy  Other Topics Concern   Not on file  Social History Narrative   Married    Lived in [place].  Moved to [place] in early 2000s   She is on disability for anxiety    Social Determinants of Health   Financial Resource Strain: Low Risk    Difficulty of Paying Living Expenses: Not hard at all  Food  Insecurity: No Food Insecurity   Worried About Charity fundraiser in the Last Year: Never true   Ran Out of Food in the Last Year: Never true  Transportation Needs: No Transportation Needs   Lack of Transportation (Medical): No   Lack of Transportation (Non-Medical): No  Physical Activity: Insufficiently Active   Days of Exercise per Week: 5 days   Minutes of Exercise per Session: 20 min  Stress: Stress Concern Present   Feeling of Stress : To some extent  Social Connections: Moderately Isolated   Frequency of Communication with Friends and Family: More than three times a week   Frequency of Social Gatherings with Friends and Family: Once a week   Attends Religious Services: Never   Marine scientist or Organizations: No   Attends Music therapist: Never   Marital Status: Married  Human resources officer Violence: Not At Risk   Fear of Current or Ex-Partner: No   Emotionally Abused: No   Physically Abused: No   Sexually Abused: No    Past Medical History, Surgical history, Social history, and Family history were reviewed and updated as appropriate.   Please see review of systems for further details on the patient's review from today.   Objective:   Physical Exam:  There were no vitals taken for this visit.  Physical Exam Constitutional:      General: She is not in acute distress. Musculoskeletal:        General: No deformity.  Neurological:     Mental Status: She is alert and oriented to person, place, and  time.     Coordination: Coordination normal.  Psychiatric:        Attention and Perception: Attention and perception normal. She does not perceive auditory or visual hallucinations.        Mood and Affect: Mood normal. Mood is not anxious or depressed. Affect is not labile, blunt, angry or inappropriate.        Speech: Speech normal.        Behavior: Behavior normal.        Thought Content: Thought content normal. Thought content is not paranoid or delusional. Thought content does not include homicidal or suicidal ideation. Thought content does not include homicidal or suicidal plan.        Cognition and Memory: Cognition and memory normal.        Judgment: Judgment normal.     Comments: Insight intact    Lab Review:     Component Value Date/Time   NA 139 12/03/2020 0443   NA 141 10/01/2019 1323   K 3.8 12/03/2020 0443   CL 107 12/03/2020 0443   CO2 24 12/03/2020 0443   GLUCOSE 104 (H) 12/03/2020 0443   BUN 10 12/03/2020 0443   BUN 10 10/01/2019 1323   CREATININE 0.80 12/03/2020 0443   CALCIUM 8.8 (L) 12/03/2020 0443   PROT 6.3 (L) 12/03/2020 0443   PROT 7.2 09/25/2018 1125   ALBUMIN 3.9 12/03/2020 0443   ALBUMIN 4.8 09/25/2018 1125   AST 21 12/03/2020 0443   ALT 33 12/03/2020 0443   ALKPHOS 94 12/03/2020 0443   BILITOT 0.4 12/03/2020 0443   BILITOT 0.3 09/25/2018 1125   GFRNONAA >60 12/03/2020 0443   GFRAA 67 10/01/2019 1323       Component Value Date/Time   WBC 5.3 12/03/2020 0443   RBC 3.91 12/03/2020 0443   HGB 12.1 12/03/2020 0443   HCT 37.9 12/03/2020 0443   PLT 215 12/03/2020 0443  MCV 96.9 12/03/2020 0443   MCH 30.9 12/03/2020 0443   MCHC 31.9 12/03/2020 0443   RDW 13.2 12/03/2020 0443   LYMPHSABS 3.8 12/01/2020 2215   MONOABS 0.8 12/01/2020 2215   EOSABS 0.1 12/01/2020 2215   BASOSABS 0.1 12/01/2020 2215    No results found for: POCLITH, LITHIUM   No results found for: PHENYTOIN, PHENOBARB, VALPROATE, CBMZ   .res Assessment: Plan:      Plan:  PDMP reviewed  1. Xanax '1mg'$  - 6 times daily 2. Seroquel '400mg'$  - 2 at hs  3. Effexor XR '75mg'$  TID  RTC 6 months - will call in 3 months for next Xanax prescription.  Patient advised to contact office with any questions, adverse effects, or acute worsening in signs and symptoms.  Discussed potential benefits, risk, and side effects of benzodiazepines to include potential risk of tolerance and dependence, as well as possible drowsiness.  Advised patient not to drive if experiencing drowsiness and to take lowest possible effective dose to minimize risk of dependence and tolerance.  Discussed potential metabolic side effects associated with atypical antipsychotics, as well as potential risk for movement side effects. Advised pt to contact office if movement side effects occur.    Diagnoses and all orders for this visit:  Generalized anxiety disorder -     venlafaxine XR (EFFEXOR-XR) 75 MG 24 hr capsule; TAKE 3 CAPSULES EVERY DAY  Major depressive disorder, recurrent episode, moderate (HCC) -     venlafaxine XR (EFFEXOR-XR) 75 MG 24 hr capsule; TAKE 3 CAPSULES EVERY DAY -     QUEtiapine (SEROQUEL) 400 MG tablet; Take 2 tablets (800 mg total) by mouth at bedtime.  Panic attacks -     ALPRAZolam (XANAX) 1 MG tablet; TAKE 2 TABLETS BY MOUTH THREE TIMES DAILY AS NEEDED FOR ANXIETY  Insomnia, unspecified type -     ALPRAZolam (XANAX) 1 MG tablet; TAKE 2 TABLETS BY MOUTH THREE TIMES DAILY AS NEEDED FOR ANXIETY -     QUEtiapine (SEROQUEL) 400 MG tablet; Take 2 tablets (800 mg total) by mouth at bedtime.   Please see After Visit Summary for patient specific instructions.  Future Appointments  Date Time Provider [place]  12/07/2021  3:15 PM [facility]-NURSE HEALTH ADVISOR 2 [facility]    No orders of the defined types were placed in this encounter.     -------------------------------

## 2020-12-23 ENCOUNTER — Ambulatory Visit: Payer: Medicare HMO | Admitting: Adult Health

## 2020-12-29 ENCOUNTER — Other Ambulatory Visit: Payer: Self-pay | Admitting: Cardiology

## 2021-01-05 ENCOUNTER — Telehealth: Payer: Self-pay | Admitting: Adult Health

## 2021-01-05 NOTE — Telephone Encounter (Signed)
Patient called to state she needs refill on meclizine (ANTIVERT) 25 MG tablet. Patient states she has one left and Tommi Rumps told her if she needed more he would send in prescription.    Please send to  [place], [place] Phone:  [phone]  Fax:  [phone]      Good callback number is [phone]   Please Advise

## 2021-01-06 MED ORDER — MECLIZINE HCL 25 MG PO TABS: 25.0000 mg | ORAL_TABLET | Freq: Three times a day (TID) | ORAL | 0 refills | Status: DC | PRN

## 2021-01-06 NOTE — Telephone Encounter (Signed)
Okay for refill?  

## 2021-01-06 NOTE — Telephone Encounter (Signed)
Rx refilled.

## 2021-01-07 ENCOUNTER — Other Ambulatory Visit: Payer: Self-pay | Admitting: Cardiology

## 2021-01-13 ENCOUNTER — Other Ambulatory Visit: Payer: Self-pay | Admitting: Cardiology

## 2021-02-10 ENCOUNTER — Telehealth: Payer: Self-pay | Admitting: Adult Health

## 2021-02-10 NOTE — Chronic Care Management (AMB) (Signed)
  Chronic Care Management   Note  02/10/2021 Name: [name] MRN: [id number] DOB: October 11, 1960  Leslie Benson is a 60 y.o. year old female who is a primary care patient of [name], NP. I reached out to Leslie Benson by phone today in response to a referral sent by Ms. Weston Brass Delman's PCP, [name], NP.   [name] was given information about Chronic Care Management services today including:  CCM service includes personalized support from designated clinical staff supervised by her physician, including individualized plan of care and coordination with other care providers 24/7 contact phone numbers for assistance for urgent and routine care needs. Service will only be billed when office clinical staff spend 20 minutes or more in a month to coordinate care. Only one practitioner may furnish and bill the service in a calendar month. The patient may stop CCM services at any time (effective at the end of the month) by phone call to the office staff.   Patient agreed to services and verbal consent obtained.   Follow up plan:   Tatjana Secretary/administrator

## 2021-02-16 ENCOUNTER — Telehealth: Payer: Self-pay | Admitting: Adult Health

## 2021-02-16 ENCOUNTER — Other Ambulatory Visit: Payer: Self-pay | Admitting: Cardiology

## 2021-02-16 NOTE — Telephone Encounter (Signed)
Patient called asking if an MRI has been scheduled for her. She states that she is supposed to have one at either 6 months or a year.  Informed patient that there was an MRI done back in August and asked if she is needing another one.  Patient says she does not know and she is confused on what she needs.  Patient would like a call at [phone].  Please advise.

## 2021-02-17 NOTE — Telephone Encounter (Signed)
Pt advised per imaging that she is to follow-up in year.

## 2021-04-01 ENCOUNTER — Other Ambulatory Visit: Payer: Self-pay | Admitting: Adult Health

## 2021-04-03 ENCOUNTER — Other Ambulatory Visit: Payer: Self-pay | Admitting: Adult Health

## 2021-04-11 ENCOUNTER — Telehealth: Payer: Self-pay | Admitting: Pharmacist

## 2021-04-11 NOTE — Chronic Care Management (AMB) (Signed)
Chronic Care Management Pharmacy Assistant   Name: Leslie Benson  MRN: [id number] DOB: [birth year]  Reason for Encounter: Chart review for initial encounter with Leslie Benson Clinical Pharmacist on 04/19/21 at 11 am via phone call.   Conditions to be addressed/monitored: HLD, Anxiety, GERD, Hypothyroidism, and Orthostatic Hypotension  Recent office visits:  12/09/20 [name], NP - Patient presented for Orthostatic hypotension and other concerns. No medication changes.  11/30/20 [name], LPN - Virtual Encounter for Medicare annual wellness exam. No medication changes.  11/30/20 [name], NP - Patient presented for routine general medical exam and other concerns. Increased Gabapentin to 900 mg 3 times daily. Stopped Ivabradine HCL 5 mg and Tramadol HCL 100 mg.  Recent consult visits:  12/14/20 [name], [name] NP [facility]) - Patient presented via Tele-health for Generalized anxiety disorder, no other visit details available.   Hospital visits:  Medication Reconciliation was completed by comparing discharge summary, patients EMR and Pharmacy list, and upon discussion with patient.  [facility] on 12/01/20 due to Orthostatic hypotension. Patient was present for 3 days.  New?Medications Started at [facility] Discharge:?? -started  meclizine (ANTIVERT  Medication Changes at Hospital Discharge: -Changed  how you take: gabapentin (Neurontin  Medications Discontinued at Hospital Discharge: -Stopped Pfizer-BioNT COVID-19 Vac-TriS Susp injection (COVID-19 mRNA Vac-TriS ([place]))  Medications that remain the same after Hospital Discharge:??  -All other medications will remain the same.    Medications: Outpatient Encounter Medications as of 04/11/2021  Medication Sig   ALPRAZolam (XANAX) 1 MG tablet TAKE 2 TABLETS BY MOUTH THREE TIMES DAILY AS NEEDED FOR ANXIETY   atorvastatin (LIPITOR) 40 MG tablet TAKE 1 TABLET DAILY  (FOLLOW UP WITH DR. Marlou Porch AS NEEDED, FURTHER REFILLS TO BE AUTHORIZED BY PRIMARY CARE PROVIDER)   gabapentin (NEURONTIN) 300 MG capsule Take 3 capsules (900 mg total) by mouth 3 (three) times daily.   levothyroxine (SYNTHROID) 25 MCG tablet TAKE 1 TABLET (25 MCG TOTAL) BY MOUTH DAILY BEFORE BREAKFAST.   meclizine (ANTIVERT) 25 MG tablet TAKE 1 TABLET BY MOUTH THREE TIMES DAILY AS NEEDED FOR DIZZINESS   omega-3 acid ethyl esters (LOVAZA) 1 G capsule Take 1 g by mouth 2 (two) times daily.   omeprazole (PRILOSEC) 20 MG capsule TAKE 1 CAPSULE EVERY DAY (Patient taking differently: Take 20 mg by mouth daily as needed (acid reflux).)   OVER THE COUNTER MEDICATION Take 1 tablet by mouth daily. Fiber Well   QUEtiapine (SEROQUEL) 400 MG tablet Take 2 tablets (800 mg total) by mouth at bedtime.   venlafaxine XR (EFFEXOR-XR) 75 MG 24 hr capsule TAKE 3 CAPSULES EVERY DAY   No facility-administered encounter medications on file as of 04/11/2021.   Fill History : ALPRAZolam (XANAX) tablet 03/03/2021 30   atorvastatin (LIPITOR) tablet 02/17/2021 60   gabapentin (NEURONTIN) capsule 02/25/2021 90   levothyroxine (SYNTHROID, LEVOTHROID) tablet 01/28/2021 90   meclizine (ANTIVERT) tablet 01/06/2021 10   omeprazole (PRILOSEC) capsule 02/08/2021 90   QUEtiapine (SEROQUEL) tablet 02/22/2021 90   venlafaxine (EFFEXOR-XR) 24 hr capsule 01/22/2021 90   Have you seen any other providers since your last visit? Patient reports no.  Any changes in your medications or health? Patient reports no  Any side effects from any medications? Patient reports no   Do you have an symptoms or problems not managed by your medications? Patient reports she has a lot of stomach issues and pain will be seeing  Gastro for this on next month.  Any concerns  about your health right now? Patient reports no  Has your provider asked that you check blood pressure, blood sugar, or follow special diet at home? Patient reports she  does not do any checks at home.  Do you get any type of exercise on a regular basis? Patient reports she does not use any assisted devices for mobility she walks her dog 2 times a day and she does the housework.  Can you think of a goal you would like to reach for your health? Patient reports shed like to feel better she has Fibromyalgia and it gives her trouble.  Do you have any problems getting your medications? Patient reports she is happy with her current pharmacy and cost of her medications.  Is there anything that you would like to discuss during the appointment? Patient reports not at this time  Patient aware to have available  medications supplements for call and that this was also her reminder for the appointment.    Care Gaps: PNA Vaccine - Overdue Zoster Vaccines - Overdue Mammogram - Overdue COVID Booster - Overdue Flu Vaccine - Overdue  Star Rating Drugs: Atorvastatin (Lipitor) 40 mg - Last filled 02/17/21 60 DS at [facility] Pharmacist Assistant [phone]

## 2021-04-18 NOTE — Progress Notes (Deleted)
Chronic Care Management Pharmacy Note  04/18/2021 Name:  Leslie Benson MRN:  [id number] DOB:  [birth year]  Summary: ***  Recommendations/Changes made from today's visit: ***  Plan: ***   Subjective: Leslie Benson is an 60 y.o. year old female who is a primary patient of [name], NP.  The CCM team was consulted for assistance with disease management and care coordination needs.    Engaged with patient by telephone for initial visit in response to provider referral for pharmacy case management and/or care coordination services.   Consent to Services:  The patient was given the following information about Chronic Care Management services today, agreed to services, and gave verbal consent: 1. CCM service includes personalized support from designated clinical staff supervised by the primary care provider, including individualized plan of care and coordination with other care providers 2. 24/7 contact phone numbers for assistance for urgent and routine care needs. 3. Service will only be billed when office clinical staff spend 20 minutes or more in a month to coordinate care. 4. Only one practitioner may furnish and bill the service in a calendar month. 5.The patient may stop CCM services at any time (effective at the end of the month) by phone call to the office staff. 6. The patient will be responsible for cost sharing (co-pay) of up to 20% of the service fee (after annual deductible is met). Patient agreed to services and consent obtained.  Patient Care Team: [name], NP as PCP - General (Family Medicine) Mayra Neer, MD (Family Medicine) Ronald Lobo, MD as Consulting Physician (Gastroenterology) Barbaraann Rondo, MD as Consulting Physician (Gynecology) Ronald Lobo, MD as Consulting Physician (Gastroenterology) Barbaraann Rondo, MD as Consulting Physician (Gynecology) [name], [facility] as Pharmacist (Pharmacist)  Recent office visits: 12/09/20  [name], NP - Patient presented for Orthostatic hypotension and other concerns. No medication changes.   11/30/20 [name], LPN - Virtual Encounter for Medicare annual wellness exam. No medication changes.   11/30/20 [name], NP - Patient presented for routine general medical exam and other concerns. Increased Gabapentin to 900 mg 3 times daily. Stopped Ivabradine HCL 5 mg and Tramadol HCL 100 mg.  Recent consult visits: 12/14/20 [name], [name] NP [facility]) - Patient presented via Tele-health for Generalized anxiety disorder, no other visit details available.  Hospital visits: Medication Reconciliation was completed by comparing discharge summary, patients EMR and Pharmacy list, and upon discussion with patient.   [facility] on 12/01/20 due to Orthostatic hypotension. Patient was present for 3 days.   New?Medications Started at [facility] Discharge:?? -started  meclizine (ANTIVERT   Medication Changes at Hospital Discharge: -Changed  how you take: gabapentin (Neurontin   Medications Discontinued at Hospital Discharge: -Stopped Pfizer-BioNT COVID-19 Vac-TriS Susp injection (COVID-19 mRNA Vac-TriS ([place]))   Medications that remain the same after Hospital Discharge:??  -All other medications will remain the same.       Objective:  Lab Results  Component Value Date   CREATININE 0.80 12/03/2020   BUN 10 12/03/2020   GFR 67.93 11/30/2020   GFRNONAA >60 12/03/2020   GFRAA 67 10/01/2019   NA 139 12/03/2020   K 3.8 12/03/2020   CALCIUM 8.8 (L) 12/03/2020   CO2 24 12/03/2020   GLUCOSE 104 (H) 12/03/2020    Lab Results  Component Value Date/Time   HGBA1C 5.6 11/30/2020 01:34 PM   HGBA1C 5.5 10/28/2019 01:59 PM   GFR 67.93 11/30/2020 01:34 PM   GFR 68.05  10/28/2019 01:59 PM    Last diabetic Eye exam: No results found for: HMDIABEYEEXA  Last diabetic Foot exam: No results found for: HMDIABFOOTEX   Lab Results   Component Value Date   CHOL 187 11/30/2020   HDL 80.00 11/30/2020   LDLCALC 89 11/30/2020   LDLDIRECT 168.0 04/05/2017   TRIG 91.0 11/30/2020   CHOLHDL 2 11/30/2020    Hepatic Function Latest Ref Rng & Units 12/03/2020 12/01/2020 11/30/2020  Total Protein 6.5 - 8.1 g/dL 6.3(L) 6.9 7.0  Albumin 3.5 - 5.0 g/dL 3.9 4.3 4.5  AST 15 - 41 U/L 21 43(H) 24  ALT 0 - 44 U/L 33 44 35  Alk Phosphatase 38 - 126 U/L 94 105 121(H)  Total Bilirubin 0.3 - 1.2 mg/dL 0.4 0.4 0.3  Bilirubin, Direct 0.0 - 0.3 mg/dL - - -    Lab Results  Component Value Date/Time   TSH 1.564 12/02/2020 12:18 PM   TSH 1.48 11/30/2020 01:34 PM   TSH 1.44 10/28/2019 01:59 PM   FREET4 0.83 01/23/2018 11:42 AM   FREET4 0.81 09/21/2013 10:15 PM    CBC Latest Ref Rng & Units 12/03/2020 12/02/2020 12/01/2020  WBC 4.0 - 10.5 K/uL 5.3 5.0 -  Hemoglobin 12.0 - 15.0 g/dL 12.1 13.8 12.6  Hematocrit 36.0 - 46.0 % 37.9 43.0 37.0  Platelets 150 - 400 K/uL 215 216 -    Lab Results  Component Value Date/Time   VD25OH 45.4 05/29/2018 01:10 PM   VD25OH 41.24 10/30/2017 03:14 PM    Clinical ASCVD: {YES/NO:21197} The 10-year ASCVD risk score (Arnett DK, et al., 2019) is: 3.7%   Values used to calculate the score:     Age: 83 years     Sex: Female     Is Non-Hispanic African American: No     Diabetic: Yes     Tobacco smoker: No     Systolic Blood Pressure: 412 mmHg     Is BP treated: No     HDL Cholesterol: 80 mg/dL     Total Cholesterol: 187 mg/dL    Depression screen [facility] 2/9 11/30/2020 11/30/2020 10/28/2019  Decreased Interest 0 0 2  Down, Depressed, Hopeless _0 PHQ - 2 Score _1 Altered sleeping - - 2  Tired, decreased energy - - 2  Change in appetite - - 3  Feeling bad or failure about yourself  - - 3  Trouble concentrating - - 0  Moving slowly or fidgety/restless - - 0  Suicidal thoughts - - 1  PHQ-9 Score - - 15  Difficult doing work/chores - - Somewhat difficult     ***Other: (CHADS2VASc if Afib, MMRC or  CAT for COPD, ACT, DEXA)  Social History   Tobacco Use  Smoking Status Never  Smokeless Tobacco Never   BP Readings from Last 3 Encounters:  12/09/20 100/80  12/04/20 117/64  11/30/20 110/60   Pulse Readings from Last 3 Encounters:  12/09/20 92  12/04/20 95  11/30/20 (!) 106   Wt Readings from Last 3 Encounters:  12/09/20 196 lb (88.9 kg)  12/04/20 209 lb 7 oz (95 kg)  11/30/20 200 lb (90.7 kg)   BMI Readings from Last 3 Encounters:  12/09/20 33.64 kg/m  12/04/20 35.95 kg/m  11/30/20 34.33 kg/m    Assessment/Interventions: Review of patient past medical history, allergies, medications, health status, including review of consultants reports, laboratory and other test data, was performed as part of comprehensive evaluation and provision of chronic care management  services.   SDOH:  (Social Determinants of Health) assessments and interventions performed: {yes/no:20286}  SDOH Screenings   Alcohol Screen: Not on file  Depression (PHQ2-9): Low Risk    PHQ-2 Score: 1  Financial Resource Strain: Low Risk    Difficulty of Paying Living Expenses: Not hard at all  Food Insecurity: No Food Insecurity   Worried About Charity fundraiser in the Last Year: Never true   Ran Out of Food in the Last Year: Never true  Housing: Low Risk    Last Housing Risk Score: 0  Physical Activity: Insufficiently Active   Days of Exercise per Week: 5 days   Minutes of Exercise per Session: 20 min  Social Connections: Moderately Isolated   Frequency of Communication with Friends and Family: More than three times a week   Frequency of Social Gatherings with Friends and Family: Once a week   Attends Religious Services: Never   Marine scientist or Organizations: No   Attends Archivist Meetings: Never   Marital Status: Married  Stress: Stress Concern Present   Feeling of Stress : To some extent  Tobacco Use: Low Risk    Smoking Tobacco Use: Never   Smokeless Tobacco Use:  Never   Passive Exposure: Not on file  Transportation Needs: No Transportation Needs   Lack of Transportation (Medical): No   Lack of Transportation (Non-Medical): No   Patient reports she has a lot of stomach issues and pain will be seeing  Gastro for this on next month.   Patient reports she does not use any assisted devices for mobility she walks her dog 2 times a day and she does the housework.  Patient reports shed like to feel better she has Fibromyalgia and it gives her trouble.   CCM Care Plan  Allergies  Allergen Reactions   Codeine Nausea And Vomiting   Compazine [Prochlorperazine Edisylate]     "feels weird"   Sulfa Antibiotics     Unknown reaction.    Medications Reviewed Today     Reviewed by Bonney Roussel, NP (Nurse Practitioner) on 12/14/20 at 1437  Med List Status: <None>   Medication Order Taking? Sig Documenting Provider Last Dose Status Informant  ALPRAZolam (XANAX) 1 MG tablet [id number]  TAKE 2 TABLETS BY MOUTH THREE TIMES DAILY AS NEEDED FOR ANXIETY [name], [name], NP  Active   atorvastatin (LIPITOR) 40 MG tablet [id number] No Take 1 tablet (40 mg total) by mouth daily. Patient is as needed follow up with Dr Marlou Porch, further refills will need to be authorized by patients PCP Jerline Pain, MD Taking Active Self  gabapentin (NEURONTIN) 300 MG capsule [id number] No Take 3 capsules (900 mg total) by mouth 3 (three) times daily. Nafziger, Tommi Rumps, NP Taking Active Self  levothyroxine (SYNTHROID) 25 MCG tablet [id number] No Take 1 tablet (25 mcg total) by mouth daily before breakfast. [name], NP Taking Active Self  meclizine (ANTIVERT) 25 MG tablet [id number] No Take 1 tablet (25 mg total) by mouth 3 (three) times daily as needed for dizziness. Dessa Phi, DO Taking Active   omega-3 acid ethyl esters (LOVAZA) 1 G capsule [id number] No Take 1 g by mouth 2 (two) times daily. [provider] Taking Active Self  omeprazole (PRILOSEC) 20  MG capsule [id number] No TAKE 1 CAPSULE EVERY DAY  Patient taking differently: Take 20 mg by mouth daily as needed (acid reflux).   Irene Shipper, MD Taking Active   OVER THE COUNTER  MEDICATION [id number] No Take 1 tablet by mouth daily. Fiber Well [provider] Taking Active Self  QUEtiapine (SEROQUEL) 400 MG tablet [id number]  Take 2 tablets (800 mg total) by mouth at bedtime. [name], [name], NP  Active   venlafaxine XR (EFFEXOR-XR) 75 MG 24 hr capsule [id number]  TAKE 3 CAPSULES EVERY DAY [name], [name], NP  Active             Patient Active Problem List   Diagnosis Date Noted   Orthostatic hypotension 12/04/2020   Vertigo 12/02/2020   Dizziness of unknown cause 12/02/2020   GERD (gastroesophageal reflux disease)    Anxiety    Hyperlipidemia associated with type 2 diabetes mellitus ([place]) 07/03/2018   Other hyperlipidemia 01/23/2018   Class 1 obesity with serious comorbidity and body mass index (BMI) of 30.0 to 30.9 in adult 01/23/2018   Hypothyroidism 11/25/2014   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/22/2013   Obesity (BMI 30-39.9) 09/22/2013   Abdominal pain, epigastric 09/22/2013   Transaminitis 09/22/2013   Cervical arthritis 09/22/2013   Chronic constipation 09/22/2013   Fibromyalgia     Immunization History  Administered Date(s) Administered   Influenza,inj,Quad PF,6+ Mos 02/24/2017, 01/31/2018, 01/20/2019, 05/17/2020   Influenza-Unspecified 01/24/2018   Janssen (J&J) SARS-COV-2 Vaccination 07/26/2019   PFIZER(Purple Top)SARS-COV-2 Vaccination 08/23/2020   Tdap 04/05/2017    Conditions to be addressed/monitored:  Hyperlipidemia, Diabetes, GERD, Hypothyroidism, Depression, Anxiety, and Fibromyalgia  There are no care plans that you recently modified to display for this patient.   Current Barriers:  {pharmacybarriers:24917}  Pharmacist Clinical Goal(s):  Patient will {PHARMACYGOALCHOICES:24921} through collaboration with PharmD and provider.    Interventions: 1:1 collaboration with [name], NP regarding development and update of comprehensive plan of care as evidenced by provider attestation and co-signature Inter-disciplinary care team collaboration (see longitudinal plan of care) Comprehensive medication review performed; medication list updated in electronic medical record  The 10-year ASCVD risk score (Arnett DK, et al., 2019) is: 3.7%   Values used to calculate the score:     Age: 27 years     Sex: Female     Is Non-Hispanic African American: No     Diabetic: Yes     Tobacco smoker: No     Systolic Blood Pressure: 160 mmHg     Is BP treated: No     HDL Cholesterol: 80 mg/dL     Total Cholesterol: 187 mg/dL  Lab Results  Component Value Date   CHOL 187 11/30/2020   HDL 80.00 11/30/2020   LDLCALC 89 11/30/2020   LDLDIRECT 168.0 04/05/2017   TRIG 91.0 11/30/2020   CHOLHDL 2 11/30/2020     Hyperlipidemia: (LDL goal < 100) -Controlled -Current treatment: Atorvastatin 40 mg 1 tablet daily Lovaza 1 g 1 capsule twice daily -Medications previously tried: ***  -Current dietary patterns: *** -Current exercise habits: *** -Educated on {CCM HLD Counseling:25126} -{CCMPHARMDINTERVENTION:25122}  Lab Results  Component Value Date   HGBA1C 5.6 11/30/2020    Diabetes (A1c goal <6.5%) -Controlled -Current medications: No medications -Medications previously tried: metformin  -Current home glucose readings fasting glucose: *** post prandial glucose: *** -{ACTIONS;DENIES/REPORTS:21021675::"Denies"} hypoglycemic/hyperglycemic symptoms -Current meal patterns:  breakfast: ***  lunch: ***  dinner: *** snacks: *** drinks: *** -Current exercise: *** -Educated on {CCM DM COUNSELING:25123} -Counseled to check feet daily and get yearly eye exams -{CCMPHARMDINTERVENTION:25122}  Depression/Anxiety (Goal: ***) -{US controlled/uncontrolled:25276} -Current treatment: Alprazolam 1 mg 2 tablets three times daily  as needed Quetiapine 400 mg 2 tablets at bedtime Venlafaxine XR  75 mg 3 capsules daily -Medications previously tried/failed: *** -PHQ9: *** -GAD7: *** -Connected with *** for mental health support -Educated on {CCM mental health counseling:25127} -{CCMPHARMDINTERVENTION:25122}  GERD (Goal: ***) -{US controlled/uncontrolled:25276} -Current treatment  Omeprazole 20 mg 1 capsule daily as needed -Medications previously tried: ***  -{CCMPHARMDINTERVENTION:25122}  Fibromyalgia (Goal: ***) -{US controlled/uncontrolled:25276} -Current treatment  Gabapentin 900 mg 3 times daily -Medications previously tried: ***  -{CCMPHARMDINTERVENTION:25122}  Lab Results  Component Value Date   TSH 1.564 12/02/2020     Hypothyroidism (Goal: 0.35-4.5) -{US controlled/uncontrolled:25276} -Current treatment  Levothyroxine 25 mcg 1 tablet daily -Medications previously tried: ***  -{CCMPHARMDINTERVENTION:25122}    Health Maintenance -Vaccine gaps: influenza, prevnar, shingrix, COVID booster -Current therapy:  Meclizine 25 mg as needed Fiberwell -Educated on {ccm supplement counseling:25128} -{CCM Patient satisfied:25129} -{CCMPHARMDINTERVENTION:25122}  Patient Goals/Self-Care Activities Patient will:  - {pharmacypatientgoals:24919}  Follow Up Plan: {CM FOLLOW UP BCWU:88916}   Medication Assistance: {MEDASSISTANCEINFO:25044}  Compliance/Adherence/Medication fill history: Care Gaps: Influenza, Prevnar, shingrix, mammogram, COVID booster A1c: 5.6% (11/30/20)  Star-Rating Drugs: Atorvastatin (Lipitor) 40 mg - Last filled 02/17/21 60 DS at [facility]   Patient's preferred pharmacy is:  [place], [place] [place] [address] Phone: [phone] Fax: [phone]  [place], [place] [address] Phone: [phone] Fax:  [phone]  Uses pill box? {Yes or If no, why not?:20788} Pt endorses ***% compliance  We discussed: {Pharmacy options:24294} Patient decided to: {US Pharmacy [facility]  Care Plan and Follow Up Patient Decision:  {FOLLOWUP:24991}  Plan: {CM FOLLOW UP FEOF:12197}  Jeni Salles, PharmD, [facility] Clinical Pharmacist {MP practice sites:26434} [phone]

## 2021-04-19 ENCOUNTER — Telehealth: Payer: Medicare HMO

## 2021-04-19 NOTE — Progress Notes (Signed)
Per Jeni Salles, call to reschedule appointment for 84/46/52 due to conflict in her schedule, patient aware and in agreement with the change.    [place] Clinical Pharmacist Assistant [phone]

## 2021-04-20 ENCOUNTER — Ambulatory Visit (INDEPENDENT_AMBULATORY_CARE_PROVIDER_SITE_OTHER): Payer: Medicare HMO | Admitting: Pharmacist

## 2021-04-20 DIAGNOSIS — F419 Anxiety disorder, unspecified: Secondary | ICD-10-CM

## 2021-04-20 DIAGNOSIS — E785 Hyperlipidemia, unspecified: Secondary | ICD-10-CM

## 2021-04-20 NOTE — Progress Notes (Signed)
Chronic Care Management Pharmacy Note  04/23/2021 Name:  Leslie Benson MRN:  [id number] DOB:  07/06/1960  Summary: Pt is still feeling stressed and isn't sleeping well  Recommendations/Changes made from today's visit: -Recommended taking venlafaxine first thing in the morning to prevent possible insomnia side effect -Recommended discussing with psychiatrist about decreasing use of Xanax given falls -Recommended connecting with counselor/therapist   Plan: Follow up in 4 months   Subjective: Leslie Benson is an 60 y.o. year old female who is a primary patient of [name], NP.  The CCM team was consulted for assistance with disease management and care coordination needs.    Engaged with patient by telephone for initial visit in response to provider referral for pharmacy case management and/or care coordination services.   Consent to Services:  The patient was given the following information about Chronic Care Management services today, agreed to services, and gave verbal consent: 1. CCM service includes personalized support from designated clinical staff supervised by the primary care provider, including individualized plan of care and coordination with other care providers 2. 24/7 contact phone numbers for assistance for urgent and routine care needs. 3. Service will only be billed when office clinical staff spend 20 minutes or more in a month to coordinate care. 4. Only one practitioner may furnish and bill the service in a calendar month. 5.The patient may stop CCM services at any time (effective at the end of the month) by phone call to the office staff. 6. The patient will be responsible for cost sharing (co-pay) of up to 20% of the service fee (after annual deductible is met). Patient agreed to services and consent obtained.  Patient Care Team: [name], NP as PCP - General (Family Medicine) Mayra Neer, MD (Family Medicine) Ronald Lobo, MD as  Consulting Physician (Gastroenterology) Barbaraann Rondo, MD as Consulting Physician (Gynecology) Ronald Lobo, MD as Consulting Physician (Gastroenterology) Barbaraann Rondo, MD as Consulting Physician (Gynecology) [name], [facility] as Pharmacist (Pharmacist)  Recent office visits: 12/09/20 [name], NP - Patient presented for Orthostatic hypotension and other concerns. No medication changes.   11/30/20 [name], LPN - Virtual Encounter for Medicare annual wellness exam. No medication changes.   11/30/20 [name], NP - Patient presented for routine general medical exam and other concerns. Increased Gabapentin to 900 mg 3 times daily. Stopped Ivabradine HCL 5 mg and Tramadol HCL 100 mg.  Recent consult visits: 12/14/20 [name], [name] NP [facility]) - Patient presented via Tele-health for Generalized anxiety disorder, no other visit details available.  Hospital visits: Medication Reconciliation was completed by comparing discharge summary, patients EMR and Pharmacy list, and upon discussion with patient.   [facility] on 12/01/20 due to Orthostatic hypotension. Patient was present for 3 days.   New?Medications Started at [facility] Discharge:?? -started  meclizine (ANTIVERT   Medication Changes at Hospital Discharge: -Changed  how you take: gabapentin (Neurontin   Medications Discontinued at Hospital Discharge: -Stopped Pfizer-BioNT COVID-19 Vac-TriS Susp injection (COVID-19 mRNA Vac-TriS ([place]))   Medications that remain the same after Hospital Discharge:??  -All other medications will remain the same.       Objective:  Lab Results  Component Value Date   CREATININE 0.80 12/03/2020   BUN 10 12/03/2020   GFR 67.93 11/30/2020   GFRNONAA >60 12/03/2020   GFRAA 67 10/01/2019   NA 139 12/03/2020   K 3.8 12/03/2020   CALCIUM 8.8 (L) 12/03/2020   CO2 24  12/03/2020   GLUCOSE 104 (H) 12/03/2020    Lab Results   Component Value Date/Time   HGBA1C 5.6 11/30/2020 01:34 PM   HGBA1C 5.5 10/28/2019 01:59 PM   GFR 67.93 11/30/2020 01:34 PM   GFR 68.05 10/28/2019 01:59 PM    Last diabetic Eye exam: No results found for: HMDIABEYEEXA  Last diabetic Foot exam: No results found for: HMDIABFOOTEX   Lab Results  Component Value Date   CHOL 187 11/30/2020   HDL 80.00 11/30/2020   LDLCALC 89 11/30/2020   LDLDIRECT 168.0 04/05/2017   TRIG 91.0 11/30/2020   CHOLHDL 2 11/30/2020    Hepatic Function Latest Ref Rng & Units 12/03/2020 12/01/2020 11/30/2020  Total Protein 6.5 - 8.1 g/dL 6.3(L) 6.9 7.0  Albumin 3.5 - 5.0 g/dL 3.9 4.3 4.5  AST 15 - 41 U/L 21 43(H) 24  ALT 0 - 44 U/L 33 44 35  Alk Phosphatase 38 - 126 U/L 94 105 121(H)  Total Bilirubin 0.3 - 1.2 mg/dL 0.4 0.4 0.3  Bilirubin, Direct 0.0 - 0.3 mg/dL - - -    Lab Results  Component Value Date/Time   TSH 1.564 12/02/2020 12:18 PM   TSH 1.48 11/30/2020 01:34 PM   TSH 1.44 10/28/2019 01:59 PM   FREET4 0.83 01/23/2018 11:42 AM   FREET4 0.81 09/21/2013 10:15 PM    CBC Latest Ref Rng & Units 12/03/2020 12/02/2020 12/01/2020  WBC 4.0 - 10.5 K/uL 5.3 5.0 -  Hemoglobin 12.0 - 15.0 g/dL 12.1 13.8 12.6  Hematocrit 36.0 - 46.0 % 37.9 43.0 37.0  Platelets 150 - 400 K/uL 215 216 -    Lab Results  Component Value Date/Time   VD25OH 45.4 05/29/2018 01:10 PM   VD25OH 41.24 10/30/2017 03:14 PM    Clinical ASCVD: No  The 10-year ASCVD risk score (Arnett DK, et al., 2019) is: 3.7%   Values used to calculate the score:     Age: 46 years     Sex: Female     Is Non-Hispanic African American: No     Diabetic: Yes     Tobacco smoker: No     Systolic Blood Pressure: 161 mmHg     Is BP treated: No     HDL Cholesterol: 80 mg/dL     Total Cholesterol: 187 mg/dL    Depression screen [facility] 2/9 11/30/2020 11/30/2020 10/28/2019  Decreased Interest 0 0 2  Down, Depressed, Hopeless 1 1 2   PHQ - 2 Score 1 1 4   Altered sleeping - - 2  Tired, decreased energy - - 2   Change in appetite - - 3  Feeling bad or failure about yourself  - - 3  Trouble concentrating - - 0  Moving slowly or fidgety/restless - - 0  Suicidal thoughts - - 1  PHQ-9 Score - - 15  Difficult doing work/chores - - Somewhat difficult      Social History   Tobacco Use  Smoking Status Never  Smokeless Tobacco Never   BP Readings from Last 3 Encounters:  12/09/20 100/80  12/04/20 117/64  11/30/20 110/60   Pulse Readings from Last 3 Encounters:  12/09/20 92  12/04/20 95  11/30/20 (!) 106   Wt Readings from Last 3 Encounters:  12/09/20 196 lb (88.9 kg)  12/04/20 209 lb 7 oz (95 kg)  11/30/20 200 lb (90.7 kg)   BMI Readings from Last 3 Encounters:  12/09/20 33.64 kg/m  12/04/20 35.95 kg/m  11/30/20 34.33 kg/m    Assessment/Interventions: Review of  patient past medical history, allergies, medications, health status, including review of consultants reports, laboratory and other test data, was performed as part of comprehensive evaluation and provision of chronic care management services.   SDOH:  (Social Determinants of Health) assessments and interventions performed: Yes SDOH Interventions    Flowsheet Row Most Recent Value  SDOH Interventions   Financial Strain Interventions Intervention Not Indicated  Transportation Interventions Intervention Not Indicated      SDOH Screenings   Alcohol Screen: Not on file  Depression (PHQ2-9): Low Risk    PHQ-2 Score: 1  Financial Resource Strain: Low Risk    Difficulty of Paying Living Expenses: Not very hard  Food Insecurity: No Food Insecurity   Worried About Charity fundraiser in the Last Year: Never true   Ran Out of Food in the Last Year: Never true  Housing: Low Risk    Last Housing Risk Score: 0  Physical Activity: Insufficiently Active   Days of Exercise per Week: 5 days   Minutes of Exercise per Session: 20 min  Social Connections: Moderately Isolated   Frequency of Communication with Friends and  Family: More than three times a week   Frequency of Social Gatherings with Friends and Family: Once a week   Attends Religious Services: Never   Marine scientist or Organizations: No   Attends Archivist Meetings: Never   Marital Status: Married  Stress: Stress Concern Present   Feeling of Stress : To some extent  Tobacco Use: Low Risk    Smoking Tobacco Use: Never   Smokeless Tobacco Use: Never   Passive Exposure: Not on file  Transportation Needs: No Transportation Needs   Lack of Transportation (Medical): No   Lack of Transportation (Non-Medical): No   Patient usually takes her dog out twice a day and tries to clean a little here and there and keep her house clean. She has bad allergies and that acts up when she is cleaning, especially dusting.  Patient doesn't have any issues affording her medications as they are all paid for [facility] and gets a 3 months supply and now she is also getting supplements from them as well.  Patient has ongoing problems with her daughters and this gives her a lot of stress in her life and she reports this causes panic attacks. Her daughter Lorriane Shire she has not seen for 5 years and she does not let her see her grandchildren for the last 5 years. She has 2 other daughters and one of them is [place] whom she is not talking to anymore.   Patient has a good relationship with her sister as she is able to call her up at any time to talk her down when she is having a panic attack. Her mother also recently passed away and this has given her stress as well.  Patient does report that she falls a lot and just had a fall before Christmas. She has had falls 6 times a year and inquired if any of her medications could be contributing.  Patient does not do any exercise but just bought a  bike and treadmill and plans to start using them. She also hasn't been sleeping well lately as she is having problems with her stomach and has been waking up in the middle of  the night and puking white foam.   CCM Care Plan  Allergies  Allergen Reactions   Codeine Nausea And Vomiting   Compazine [Prochlorperazine Edisylate]     "feels  weird"   Sulfa Antibiotics     Unknown reaction.    Medications Reviewed Today     Reviewed by [name], [facility] (Pharmacist) on 04/20/21 at 90  Med List Status: <None>   Medication Order Taking? Sig Documenting Provider Last Dose Status Informant  ALPRAZolam (XANAX) 1 MG tablet [id number] Yes TAKE 2 TABLETS BY MOUTH THREE TIMES DAILY AS NEEDED FOR ANXIETY [name], [name], NP Taking Active   atorvastatin (LIPITOR) 40 MG tablet [id number] Yes TAKE 1 TABLET DAILY (FOLLOW UP WITH DR. Marlou Porch AS NEEDED, FURTHER REFILLS TO BE AUTHORIZED BY PRIMARY CARE PROVIDER) Jerline Pain, MD Taking Active   gabapentin (NEURONTIN) 300 MG capsule [id number] Yes Take 3 capsules (900 mg total) by mouth 3 (three) times daily. Nafziger, Tommi Rumps, NP Taking Active Self  levothyroxine (SYNTHROID) 25 MCG tablet [id number] Yes TAKE 1 TABLET (25 MCG TOTAL) BY MOUTH DAILY BEFORE BREAKFAST. Nafziger, Tommi Rumps, NP Taking Active   meclizine (ANTIVERT) 25 MG tablet [id number] Yes TAKE 1 TABLET BY MOUTH THREE TIMES DAILY AS NEEDED FOR DIZZINESS Nafziger, Tommi Rumps, NP Taking Active   Multiple Vitamin (MULTIVITAMIN ADULT PO) [id number] Yes Take 1 tablet by mouth daily. [provider] Taking Active   omega-3 acid ethyl esters (LOVAZA) 1 G capsule [id number] Yes Take 1 g by mouth 2 (two) times daily. [provider] Taking Active Self  omeprazole (PRILOSEC) 20 MG capsule [id number] Yes TAKE 1 CAPSULE EVERY DAY Irene Shipper, MD Taking Active   OVER THE COUNTER MEDICATION [id number] Yes Take 1 tablet by mouth daily. Fiber Well [provider] Taking Active Self  QUEtiapine (SEROQUEL) 400 MG tablet [id number] Yes Take 2 tablets (800 mg total) by mouth at bedtime. [name], [name], NP Taking Active   venlafaxine XR (EFFEXOR-XR) 75 MG 24 hr  capsule [id number] Yes TAKE 3 CAPSULES EVERY DAY [name], [name], NP Taking Active             Patient Active Problem List   Diagnosis Date Noted   Orthostatic hypotension 12/04/2020   Vertigo 12/02/2020   Dizziness of unknown cause 12/02/2020   GERD (gastroesophageal reflux disease)    Anxiety    Hyperlipidemia associated with type 2 diabetes mellitus ([place]) 07/03/2018   Other hyperlipidemia 01/23/2018   Class 1 obesity with serious comorbidity and body mass index (BMI) of 30.0 to 30.9 in adult 01/23/2018   Hypothyroidism 11/25/2014   Adynamic ileus ([place]) 09/22/2013   Obesity (BMI 30-39.9) 09/22/2013   Abdominal pain, epigastric 09/22/2013   Transaminitis 09/22/2013   Cervical arthritis 09/22/2013   Chronic constipation 09/22/2013   Fibromyalgia     Immunization History  Administered Date(s) Administered   Influenza,inj,Quad PF,6+ Mos 02/24/2017, 01/31/2018, 01/20/2019, 05/17/2020   Influenza-Unspecified 01/24/2018   Janssen (J&J) SARS-COV-2 Vaccination 07/26/2019   PFIZER(Purple Top)SARS-COV-2 Vaccination 08/23/2020   Tdap 04/05/2017    Conditions to be addressed/monitored:  Hyperlipidemia, Diabetes, GERD, Hypothyroidism, Depression, Anxiety, and Fibromyalgia  Care Plan : [place]  Updates made by [name], [place] since 04/23/2021 12:00 AM     Problem: Problem: Hyperlipidemia, Diabetes, GERD, Hypothyroidism, Depression, Anxiety, and Fibromyalgia      Long-Range Goal: Patient-Specific Goal   Start Date: 04/20/2021  Expected End Date: 04/20/2022  This Visit's Progress: On track  Priority: High  Note:   Current Barriers:  Unable to independently monitor therapeutic efficacy  Pharmacist Clinical Goal(s):  Patient will achieve adherence to monitoring guidelines and medication adherence to achieve therapeutic efficacy through collaboration with PharmD  and provider.   Interventions: 1:1 collaboration with [name], NP regarding  development and update of comprehensive plan of care as evidenced by provider attestation and co-signature Inter-disciplinary care team collaboration (see longitudinal plan of care) Comprehensive medication review performed; medication list updated in electronic medical record  Hyperlipidemia: (LDL goal < 100) -Controlled -Current treatment: Atorvastatin 40 mg 1 tablet daily Lovaza 1 g 1 capsule twice daily -Medications previously tried: none  -Current dietary patterns: did not discuss -Current exercise habits: not exercising right now -Educated on Cholesterol goals;  Benefits of statin for ASCVD risk reduction; Exercise goal of 150 minutes per week; -Counseled on diet and exercise extensively Recommended to continue current medication  Diabetes/pre-diabetes (A1c goal <6.5%) -Controlled -Current medications: No medications -Medications previously tried: metformin  -Current home glucose readings fasting glucose: does not need to check post prandial glucose: does not need to check -Denies hypoglycemic/hyperglycemic symptoms -Current meal patterns:  breakfast: n/a  lunch: n/a  dinner: n/a snacks: n/a drinks: n/a -Current exercise: does not exercise -Educated on A1c and blood sugar goals; -Counseled to check feet daily and get yearly eye exams -Counseled on diet and exercise extensively  Depression/Anxiety (Goal: minimize symptoms) -Not ideally controlled -Current treatment: Alprazolam 1 mg 2 tablets three times daily as needed (taking 2 every 6 hours) Quetiapine 400 mg 2 tablets at bedtime Venlafaxine XR 75 mg 3 capsules daily (taking 3 times a day)  -Medications previously tried/failed: unknown -PHQ9: 1 -GAD7: n/a -Educated on Benefits of medication for symptom control Benefits of cognitive-behavioral therapy with or without medication -Recommended to continue current medication Counseled on risks of taking benzodiazepines long term and discuss with psychiatrist about  decreasing use.  GERD (Goal: minimize symptoms) -Controlled -Current treatment  Omeprazole 20 mg 1 capsule daily as needed -Medications previously tried: none  -Counseled on taking it every night prior to when heartburn is bothering her.  Fibromyalgia (Goal: minimize pain) -Controlled -Current treatment  Gabapentin 900 mg 3 times daily (taking 600 and 300 mg) -Medications previously tried: none  -Counseled on separating from alprazolam due to sedation.  Hypothyroidism (Goal: 0.35-4.5) -Controlled -Current treatment  Levothyroxine 25 mcg 1 tablet daily -Medications previously tried: none  -Counseled on importance of separating from other medications on an empty stomach.  Health Maintenance -Vaccine gaps: influenza, prevnar, shingrix, COVID booster -Current therapy:  Meclizine 25 mg as needed Fiberwell daily -Educated on Cost vs benefit of each product must be carefully weighed by individual consumer Supplements may interfere with prescription drugs -Patient is satisfied with current therapy and denies issues -Recommended to continue current medication  Patient Goals/Self-Care Activities Patient will:  - take medications as prescribed as evidenced by patient report and record review target a minimum of 150 minutes of moderate intensity exercise weekly  Follow Up Plan: Telephone follow up appointment with care management team member scheduled for: 4 months        Medication Assistance: None required.  Patient affirms current coverage meets needs.  Compliance/Adherence/Medication fill history: Care Gaps: Influenza, Prevnar, shingrix, mammogram, COVID booster A1c: 5.6% (11/30/20)  Star-Rating Drugs: Atorvastatin (Lipitor) 40 mg - Last filled 02/17/21 60 DS at [facility]   Patient's preferred pharmacy is:  [address], [place] [address] Phone: [phone] Fax:  [phone]  [place], [place] [address] Phone: [phone] Fax: [phone]  Uses pill box? Yes -  fills it up once a week  Pt endorses 95% compliance - goes ahead   We discussed: Current pharmacy is preferred with insurance plan and patient is satisfied with pharmacy services Patient decided to: Continue current medication management strategy  Care Plan and Follow Up Patient Decision:  Patient agrees to Care Plan and Follow-up.  Plan: Telephone follow up appointment with care management team member scheduled for:  4 months  Jeni Salles, PharmD, [place] at [place] [phone]

## 2021-04-23 DIAGNOSIS — F32A Depression, unspecified: Secondary | ICD-10-CM | POA: Diagnosis not present

## 2021-04-23 DIAGNOSIS — F419 Anxiety disorder, unspecified: Secondary | ICD-10-CM

## 2021-04-23 DIAGNOSIS — E785 Hyperlipidemia, unspecified: Secondary | ICD-10-CM

## 2021-04-23 DIAGNOSIS — E1169 Type 2 diabetes mellitus with other specified complication: Secondary | ICD-10-CM

## 2021-04-23 NOTE — Patient Instructions (Addendum)
Hi Leslie Benson,  It was great to get to meet you over the telephone! Below is a summary of some of the topics we discussed.   Don't forget to make sure to take your venlafaxine all at the same time in the morning as this can sometimes contribute to trouble sleeping. I also want you to strongly consider counseling/therapy because I think it could be very helpful.   Please reach out to me if you have any questions or need anything before our follow up!  Best, Maddie  Jeni Salles, PharmD, [place] Pharmacist [place] [facility] [place]   Visit Information   Goals Addressed   None    Patient Care Plan: CCM Pharmacy Care Plan     Problem Identified: Problem: Hyperlipidemia, Diabetes, GERD, Hypothyroidism, Depression, Anxiety, and Fibromyalgia      Long-Range Goal: Patient-Specific Goal   Start Date: 04/20/2021  Expected End Date: 04/20/2022  This Visit's Progress: On track  Priority: High  Note:   Current Barriers:  Unable to independently monitor therapeutic efficacy  Pharmacist Clinical Goal(s):  Patient will achieve adherence to monitoring guidelines and medication adherence to achieve therapeutic efficacy through collaboration with PharmD and provider.   Interventions: 1:1 collaboration with [name], NP regarding development and update of comprehensive plan of care as evidenced by provider attestation and co-signature Inter-disciplinary care team collaboration (see longitudinal plan of care) Comprehensive medication review performed; medication list updated in electronic medical record  Hyperlipidemia: (LDL goal < 100) -Controlled -Current treatment: Atorvastatin 40 mg 1 tablet daily Lovaza 1 g 1 capsule twice daily -Medications previously tried: none  -Current dietary patterns: did not discuss -Current exercise habits: not exercising right now -Educated on Cholesterol goals;  Benefits of statin for ASCVD risk reduction; Exercise  goal of 150 minutes per week; -Counseled on diet and exercise extensively Recommended to continue current medication  Diabetes/pre-diabetes (A1c goal <6.5%) -Controlled -Current medications: No medications -Medications previously tried: metformin  -Current home glucose readings fasting glucose: does not need to check post prandial glucose: does not need to check -Denies hypoglycemic/hyperglycemic symptoms -Current meal patterns:  breakfast: n/a  lunch: n/a  dinner: n/a snacks: n/a drinks: n/a -Current exercise: does not exercise -Educated on A1c and blood sugar goals; -Counseled to check feet daily and get yearly eye exams -Counseled on diet and exercise extensively  Depression/Anxiety (Goal: minimize symptoms) -Not ideally controlled -Current treatment: Alprazolam 1 mg 2 tablets three times daily as needed (taking 2 every 6 hours) Quetiapine 400 mg 2 tablets at bedtime Venlafaxine XR 75 mg 3 capsules daily (taking 3 times a day)  -Medications previously tried/failed: unknown -PHQ9: 1 -GAD7: n/a -Educated on Benefits of medication for symptom control Benefits of cognitive-behavioral therapy with or without medication -Recommended to continue current medication Counseled on risks of taking benzodiazepines long term and discuss with psychiatrist about decreasing use.  GERD (Goal: minimize symptoms) -Controlled -Current treatment  Omeprazole 20 mg 1 capsule daily as needed -Medications previously tried: none  -Counseled on taking it every night prior to when heartburn is bothering her.  Fibromyalgia (Goal: minimize pain) -Controlled -Current treatment  Gabapentin 900 mg 3 times daily (taking 600 and 300 mg) -Medications previously tried: none  -Counseled on separating from alprazolam due to sedation.  Hypothyroidism (Goal: 0.35-4.5) -Controlled -Current treatment  Levothyroxine 25 mcg 1 tablet daily -Medications previously tried: none  -Counseled on importance of  separating from other medications on an empty stomach.  Health Maintenance -Vaccine gaps: influenza, prevnar, shingrix, COVID booster -  Current therapy:  Meclizine 25 mg as needed Fiberwell daily -Educated on Cost vs benefit of each product must be carefully weighed by individual consumer Supplements may interfere with prescription drugs -Patient is satisfied with current therapy and denies issues -Recommended to continue current medication  Patient Goals/Self-Care Activities Patient will:  - take medications as prescribed as evidenced by patient report and record review target a minimum of 150 minutes of moderate intensity exercise weekly  Follow Up Plan: Telephone follow up appointment with care management team member scheduled for: 4 months      [name] was given information about Chronic Care Management services today including:  CCM service includes personalized support from designated clinical staff supervised by her physician, including individualized plan of care and coordination with other care providers 24/7 contact phone numbers for assistance for urgent and routine care needs. Standard insurance, coinsurance, copays and deductibles apply for chronic care management only during months in which we provide at least 20 minutes of these services. Most insurances cover these services at 100%, however patients may be responsible for any copay, coinsurance and/or deductible if applicable. This service may help you avoid the need for more expensive face-to-face services. Only one practitioner may furnish and bill the service in a calendar month. The patient may stop CCM services at any time (effective at the end of the month) by phone call to the office staff.  Patient agreed to services and verbal consent obtained.   The patient verbalized understanding of instructions, educational materials, and care plan provided today and agreed to receive a mailed copy of patient  instructions, educational materials, and care plan.  Telephone follow up appointment with pharmacy team member scheduled for: 4 months  [name], [facility]

## 2021-05-03 ENCOUNTER — Other Ambulatory Visit: Payer: Self-pay | Admitting: Adult Health

## 2021-05-03 DIAGNOSIS — G4700 Insomnia, unspecified: Secondary | ICD-10-CM

## 2021-05-03 DIAGNOSIS — F410 Panic disorder [episodic paroxysmal anxiety] without agoraphobia: Secondary | ICD-10-CM

## 2021-05-13 ENCOUNTER — Ambulatory Visit: Payer: Medicare HMO | Admitting: Cardiology

## 2021-05-18 ENCOUNTER — Ambulatory Visit: Payer: Medicare HMO | Admitting: Internal Medicine

## 2021-06-02 ENCOUNTER — Other Ambulatory Visit: Payer: Self-pay | Admitting: Adult Health

## 2021-06-02 DIAGNOSIS — G4700 Insomnia, unspecified: Secondary | ICD-10-CM

## 2021-06-02 DIAGNOSIS — F410 Panic disorder [episodic paroxysmal anxiety] without agoraphobia: Secondary | ICD-10-CM

## 2021-06-14 ENCOUNTER — Ambulatory Visit: Payer: Medicare HMO | Admitting: Internal Medicine

## 2021-06-16 ENCOUNTER — Ambulatory Visit (INDEPENDENT_AMBULATORY_CARE_PROVIDER_SITE_OTHER): Payer: Medicare HMO | Admitting: Adult Health

## 2021-06-16 ENCOUNTER — Other Ambulatory Visit: Payer: Self-pay

## 2021-06-16 ENCOUNTER — Encounter: Payer: Self-pay | Admitting: Adult Health

## 2021-06-16 ENCOUNTER — Other Ambulatory Visit: Payer: Self-pay | Admitting: Internal Medicine

## 2021-06-16 DIAGNOSIS — F410 Panic disorder [episodic paroxysmal anxiety] without agoraphobia: Secondary | ICD-10-CM

## 2021-06-16 DIAGNOSIS — F411 Generalized anxiety disorder: Secondary | ICD-10-CM | POA: Diagnosis not present

## 2021-06-16 DIAGNOSIS — K219 Gastro-esophageal reflux disease without esophagitis: Secondary | ICD-10-CM

## 2021-06-16 DIAGNOSIS — R1310 Dysphagia, unspecified: Secondary | ICD-10-CM

## 2021-06-16 DIAGNOSIS — F331 Major depressive disorder, recurrent, moderate: Secondary | ICD-10-CM | POA: Diagnosis not present

## 2021-06-16 DIAGNOSIS — G4700 Insomnia, unspecified: Secondary | ICD-10-CM

## 2021-06-16 DIAGNOSIS — K222 Esophageal obstruction: Secondary | ICD-10-CM

## 2021-06-16 MED ORDER — QUETIAPINE FUMARATE 400 MG PO TABS: 800.0000 mg | ORAL_TABLET | Freq: Every day | ORAL | 1 refills | Status: DC

## 2021-06-16 MED ORDER — VENLAFAXINE HCL ER 75 MG PO CP24: ORAL_CAPSULE | ORAL | 1 refills | Status: DC

## 2021-06-16 NOTE — Progress Notes (Signed)
Leslie Benson [id number] May 26, 1960 61 y.o.  Subjective:   Patient ID:  Leslie Benson is a 62 y.o. (DOB 11-02-1960) female.  Chief Complaint: No chief complaint on file.   HPI Leslie Benson presents to the office today for follow-up of GAD, MDD, panic attacks, and insomnia.  Describes mood today as "not the best". Pleasant. Tearful at times. Mood symptoms - reports depression, anxiety, and irritability. More nervous overall. Stating "I'm having a lot of anxiety". Not wanting to leave the house. Having panic attacks. Increased pain - fibromyalgia. Back hurting - "it's the worst pain". She and husband having intimacy issues. Has not seen daughter and grand kids in 5 years - I worry about the grandkids. Varying interest and motivation. Taking medications as prescribed.  Energy levels low - feeling tired all the time. Active, does not have a regular exercise routine.   Enjoys some usual interests and activities. Married. Lives with husband of 13 years. Has 3 grown daughters - 2 grandchildren. Spending time with family. Talking to sisters. Appetite adequate. Weight 196 pounds. Sleeps well most nights. Averages 6 hours with Seroquel. Napping during the day.  Focus and concentration difficulties. Completing tasks. Managing aspects of household. Receives SSI disability benefits for Fibromyalgia - 20 years.  Denies SI or HI.  Denies AH or VH.  Previous medication trials: Unknown    PHQ2-9    Flowsheet Row Clinical Support from 11/30/2020 in [facility] at [place] Most recent reading at 11/30/2020  3:24 PM Office Visit from 11/30/2020 in [facility] at [place] Most recent reading at 11/30/2020 12:58 PM Clinical Support from 10/28/2019 in [facility] at [place] Most recent reading at 10/28/2019  2:08 PM Office Visit from 01/23/2018 in [facility] Most recent reading at 01/23/2018  9:10 AM Office Visit from 08/13/2015 in [facility] at  [place] Most recent reading at 08/13/2015  2:02 PM  PHQ-2 Total Score 1 1 4 6  0  PHQ-9 Total Score -- -- 15 19 --      Flowsheet Row ED to Hosp-Admission (Discharged) from 12/01/2020 in [facility]  C-SSRS RISK CATEGORY No Risk        Review of Systems:  Review of Systems  Musculoskeletal:  Negative for gait problem.  Neurological:  Negative for tremors.  Psychiatric/Behavioral:         Please refer to HPI   Medications: I have reviewed the patient's current medications.  Current Outpatient Medications  Medication Sig Dispense Refill   ALPRAZolam (XANAX) 1 MG tablet TAKE 2 TABLETS BY MOUTH THREE TIMES DAILY AS NEEDED FOR ANXIETY 180 tablet 0   atorvastatin (LIPITOR) 40 MG tablet TAKE 1 TABLET DAILY (FOLLOW UP WITH DR. Marlou Porch AS NEEDED, FURTHER REFILLS TO BE AUTHORIZED BY PRIMARY CARE PROVIDER) 60 tablet 11   gabapentin (NEURONTIN) 300 MG capsule Take 3 capsules (900 mg total) by mouth 3 (three) times daily. 810 capsule 1   levothyroxine (SYNTHROID) 25 MCG tablet TAKE 1 TABLET (25 MCG TOTAL) BY MOUTH DAILY BEFORE BREAKFAST. 90 tablet 3   meclizine (ANTIVERT) 25 MG tablet TAKE 1 TABLET BY MOUTH THREE TIMES DAILY AS NEEDED FOR DIZZINESS 30 tablet 0   Multiple Vitamin (MULTIVITAMIN ADULT PO) Take 1 tablet by mouth daily.     omega-3 acid ethyl esters (LOVAZA) 1 G capsule Take 1 g by mouth 2 (two) times daily.     omeprazole (PRILOSEC) 20 MG capsule TAKE 1 CAPSULE EVERY DAY 90 capsule 3   OVER  THE COUNTER MEDICATION Take 1 tablet by mouth daily. Fiber Well     QUEtiapine (SEROQUEL) 400 MG tablet Take 2 tablets (800 mg total) by mouth at bedtime. 180 tablet 1   venlafaxine XR (EFFEXOR-XR) 75 MG 24 hr capsule TAKE 3 CAPSULES EVERY DAY 270 capsule 1   No current facility-administered medications for this visit.    Medication Side Effects: None  Allergies:  Allergies  Allergen Reactions   Codeine Nausea And Vomiting   Compazine [Prochlorperazine Edisylate]      "feels weird"   Sulfa Antibiotics     Unknown reaction.    Past Medical History:  Diagnosis Date   ADHD    Allergy    Anemia    past hx    Anxiety    Barrett esophagus    Breast nodule    benign   Cervical arthritis 09/22/2013   Depression    Elevated liver enzymes    Fatty liver    Fibromyalgia    Fibromyalgia    GERD (gastroesophageal reflux disease)    H/O hiatal hernia    Headache(784.0)    migraines   High cholesterol    History of UTI    Lymphocytic colitis    Migraines    Pain    Panic attack    Paraesophageal hiatal hernia    repaired 2009   Spinal stenosis    Thyroid disease    Thyroid disease     Past Medical History, Surgical history, Social history, and Family history were reviewed and updated as appropriate.   Please see review of systems for further details on the patient's review from today.   Objective:   Physical Exam:  There were no vitals taken for this visit.  Physical Exam Constitutional:      General: She is not in acute distress. Musculoskeletal:        General: No deformity.  Neurological:     Mental Status: She is alert and oriented to person, place, and time.     Coordination: Coordination normal.  Psychiatric:        Attention and Perception: Attention and perception normal. She does not perceive auditory or visual hallucinations.        Mood and Affect: Mood normal. Mood is not anxious or depressed. Affect is not labile, blunt, angry or inappropriate.        Speech: Speech normal.        Behavior: Behavior normal.        Thought Content: Thought content normal. Thought content is not paranoid or delusional. Thought content does not include homicidal or suicidal ideation. Thought content does not include homicidal or suicidal plan.        Cognition and Memory: Cognition and memory normal.        Judgment: Judgment normal.     Comments: Insight intact    Lab Review:     Component Value Date/Time   NA 139 12/03/2020 0443    NA 141 10/01/2019 1323   K 3.8 12/03/2020 0443   CL 107 12/03/2020 0443   CO2 24 12/03/2020 0443   GLUCOSE 104 (H) 12/03/2020 0443   BUN 10 12/03/2020 0443   BUN 10 10/01/2019 1323   CREATININE 0.80 12/03/2020 0443   CALCIUM 8.8 (L) 12/03/2020 0443   PROT 6.3 (L) 12/03/2020 0443   PROT 7.2 09/25/2018 1125   ALBUMIN 3.9 12/03/2020 0443   ALBUMIN 4.8 09/25/2018 1125   AST 21 12/03/2020 0443   ALT 33 12/03/2020 0443  ALKPHOS 94 12/03/2020 0443   BILITOT 0.4 12/03/2020 0443   BILITOT 0.3 09/25/2018 1125   GFRNONAA >60 12/03/2020 0443   GFRAA 67 10/01/2019 1323       Component Value Date/Time   WBC 5.3 12/03/2020 0443   RBC 3.91 12/03/2020 0443   HGB 12.1 12/03/2020 0443   HCT 37.9 12/03/2020 0443   PLT 215 12/03/2020 0443   MCV 96.9 12/03/2020 0443   MCH 30.9 12/03/2020 0443   MCHC 31.9 12/03/2020 0443   RDW 13.2 12/03/2020 0443   LYMPHSABS 3.8 12/01/2020 2215   MONOABS 0.8 12/01/2020 2215   EOSABS 0.1 12/01/2020 2215   BASOSABS 0.1 12/01/2020 2215    No results found for: POCLITH, LITHIUM   No results found for: PHENYTOIN, PHENOBARB, VALPROATE, CBMZ   .res Assessment: Plan:    Plan:  PDMP reviewed  1. Xanax 1mg  - 6 times daily 2. Seroquel 400mg  - 2 at hs  3. Effexor XR 75mg  TID  RTC 6 months - will call in 3 months for next Xanax prescription.  Patient advised to contact office with any questions, adverse effects, or acute worsening in signs and symptoms.  Discussed potential benefits, risk, and side effects of benzodiazepines to include potential risk of tolerance and dependence, as well as possible drowsiness.  Advised patient not to drive if experiencing drowsiness and to take lowest possible effective dose to minimize risk of dependence and tolerance.  Discussed potential metabolic side effects associated with atypical antipsychotics, as well as potential risk for movement side effects. Advised pt to contact office if movement side effects occur.     Diagnoses and all orders for this visit:  Panic attacks  Major depressive disorder, recurrent episode, moderate (HCC) -     QUEtiapine (SEROQUEL) 400 MG tablet; Take 2 tablets (800 mg total) by mouth at bedtime. -     venlafaxine XR (EFFEXOR-XR) 75 MG 24 hr capsule; TAKE 3 CAPSULES EVERY DAY  Insomnia, unspecified type -     QUEtiapine (SEROQUEL) 400 MG tablet; Take 2 tablets (800 mg total) by mouth at bedtime.  Generalized anxiety disorder -     venlafaxine XR (EFFEXOR-XR) 75 MG 24 hr capsule; TAKE 3 CAPSULES EVERY DAY     Please see After Visit Summary for patient specific instructions.  Future Appointments  Date Time Provider [place]  07/20/2021  3:20 PM Irene Shipper, MD [facility]  08/17/2021  4:00 PM [facility] PHARMACIST [facility]  08/25/2021  3:40 PM Jerline Pain, MD [facility]  12/07/2021  3:15 PM [facility]SE HEALTH ADVISOR 2 [facility]    No orders of the defined types were placed in this encounter.   -------------------------------

## 2021-06-22 ENCOUNTER — Ambulatory Visit (INDEPENDENT_AMBULATORY_CARE_PROVIDER_SITE_OTHER): Payer: Medicare HMO | Admitting: Adult Health

## 2021-06-22 ENCOUNTER — Ambulatory Visit (INDEPENDENT_AMBULATORY_CARE_PROVIDER_SITE_OTHER): Payer: Medicare HMO

## 2021-06-22 ENCOUNTER — Encounter: Payer: Self-pay | Admitting: Adult Health

## 2021-06-22 ENCOUNTER — Other Ambulatory Visit: Payer: Self-pay

## 2021-06-22 VITALS — BP 120/80 | HR 96 | Temp 98.6°F | Ht 64.0 in | Wt 204.0 lb

## 2021-06-22 DIAGNOSIS — E669 Obesity, unspecified: Secondary | ICD-10-CM | POA: Diagnosis not present

## 2021-06-22 DIAGNOSIS — M545 Low back pain, unspecified: Secondary | ICD-10-CM

## 2021-06-22 DIAGNOSIS — G8929 Other chronic pain: Secondary | ICD-10-CM

## 2021-06-22 MED ORDER — SEMAGLUTIDE-WEIGHT MANAGEMENT 0.25 MG/0.5ML ~~LOC~~ SOAJ: 0.2500 mg | SUBCUTANEOUS | 0 refills | Status: DC

## 2021-06-22 NOTE — Progress Notes (Signed)
Subjective:    Patient ID: Leslie Benson, female    DOB: 10/16/1960, 61 y.o.   MRN: [id number]  HPI 61 year old female who  has a past medical history of ADHD, Allergy, Anemia, Anxiety, Barrett esophagus, Breast nodule, Cervical arthritis (09/22/2013), Depression, Elevated liver enzymes, Fatty liver, Fibromyalgia, Fibromyalgia, GERD (gastroesophageal reflux disease), H/O hiatal hernia, Headache(784.0), High cholesterol, History of UTI, Lymphocytic colitis, Migraines, Pain, Panic attack, Paraesophageal hiatal hernia, Spinal stenosis, Thyroid disease, and Thyroid disease.  She presents to the office today at the request of her psychiatrist.  She was recently seen roughly 8 days ago by her psychiatrist.  She reported depression, anxiety, and irritability.  She is not wanting to leave the house, having panic attacks.  Having increased back pain due to fibromyalgia.  A lot of her mental health issues come from issues with her husband as well as her daughters and grandkids and she has not seen her daughters and grandkids in the last 80 years and has not had sex with her husband in the last year.  He does feel tired all the time and like her energy levels are low.  She does not exercise on a routine basis due to her chronic back pain.  Today she reports that her biggest issue is that of depression related to obesity and weight gain.  She states "I hate the way I look, I understand why my husband does not love me and it is because I am fat.  He does not even like looking at me".  She continues "my psychiatrist wanted me to come see you to discuss getting on the shot to lose weight".   Review of Systems  See HPI    Past Medical History:  Diagnosis Date   ADHD    Allergy    Anemia    past hx    Anxiety    Barrett esophagus    Breast nodule    benign   Cervical arthritis 09/22/2013   Depression    Elevated liver enzymes    Fatty liver    Fibromyalgia    Fibromyalgia    GERD  (gastroesophageal reflux disease)    H/O hiatal hernia    Headache(784.0)    migraines   High cholesterol    History of UTI    Lymphocytic colitis    Migraines    Pain    Panic attack    Paraesophageal hiatal hernia    repaired 2009   Spinal stenosis    Thyroid disease    Thyroid disease     Social History   Socioeconomic History   Marital status: Married    Spouse name: Holiday representative   Number of children: Not on file   Years of education: Not on file   Highest education level: Not on file  Occupational History   Occupation: disabled  Tobacco Use   Smoking status: Never   Smokeless tobacco: Never  Vaping Use   Vaping Use: Never used  Substance and Sexual Activity   Alcohol use: Not Currently    Comment: wine occassionally   Drug use: No   Sexual activity: Yes    Birth control/protection: None    Comment: n/a partial hysterectomy  Other Topics Concern   Not on file  Social History Narrative   Married    Lived in [place].  Moved to [place] in early 2000s   She is on disability for anxiety    Social Determinants of Radio broadcast assistant  Strain: Low Risk    Difficulty of Paying Living Expenses: Not very hard  Food Insecurity: No Food Insecurity   Worried About Charity fundraiser in the Last Year: Never true   Ran Out of Food in the Last Year: Never true  Transportation Needs: No Transportation Needs   Lack of Transportation (Medical): No   Lack of Transportation (Non-Medical): No  Physical Activity: Insufficiently Active   Days of Exercise per Week: 5 days   Minutes of Exercise per Session: 20 min  Stress: Stress Concern Present   Feeling of Stress : To some extent  Social Connections: Moderately Isolated   Frequency of Communication with Friends and Family: More than three times a week   Frequency of Social Gatherings with Friends and Family: Once a week   Attends Religious Services: Never   Marine scientist or Organizations: No   Attends Programme researcher, broadcasting/film/video: Never   Marital Status: Married  Human resources officer Violence: Not At Risk   Fear of Current or Ex-Partner: No   Emotionally Abused: No   Physically Abused: No   Sexually Abused: No    Past Surgical History:  Procedure Laterality Date   ANKLE SURGERY     CHOLECYSTECTOMY  1980s?   COLONOSCOPY  2010   Buccini   EUS N/A 11/19/2013   Procedure: ESOPHAGEAL ENDOSCOPIC ULTRASOUND (EUS) RADIAL;  Surgeon: Arta Silence, MD;  Location: [facility];  Service: Endoscopy;  Laterality: N/A;   LAPAROSCOPIC NISSEN FUNDOPLICATION  60/73/7106   LAPAROSCOPIC PARAESOPHAGEAL HERNIA REPAIR  03/05/2008   Dr Johney Maine   TUBAL LIGATION  1990s?   UPPER GASTROINTESTINAL ENDOSCOPY  04/05/2018   Scarlette Shorts    VAGINAL HYSTERECTOMY  03/04/2001    Family History  Problem Relation Age of Onset   Alcohol abuse Other    Elevated Lipids Other    Heart disease Other    Anxiety disorder Mother    Obesity Mother    High Cholesterol Mother    Thyroid cancer Mother    Depression Mother    Breast cancer Mother    Bone cancer Mother    High Cholesterol Father    Heart disease Father    Alcoholism Father    Colon cancer Maternal Aunt    Colon polyps Sister    Stomach cancer Neg Hx    Esophageal cancer Neg Hx    Rectal cancer Neg Hx     Allergies  Allergen Reactions   Codeine Nausea And Vomiting   Compazine [Prochlorperazine Edisylate]     "feels weird"   Sulfa Antibiotics     Unknown reaction.    Current Outpatient Medications on File Prior to Visit  Medication Sig Dispense Refill   ALPRAZolam (XANAX) 1 MG tablet TAKE 2 TABLETS BY MOUTH THREE TIMES DAILY AS NEEDED FOR ANXIETY 180 tablet 0   atorvastatin (LIPITOR) 40 MG tablet TAKE 1 TABLET DAILY (FOLLOW UP WITH DR. Marlou Porch AS NEEDED, FURTHER REFILLS TO BE AUTHORIZED BY PRIMARY CARE PROVIDER) 60 tablet 11   gabapentin (NEURONTIN) 300 MG capsule Take 3 capsules (900 mg total) by mouth 3 (three) times daily. 810 capsule 1    levothyroxine (SYNTHROID) 25 MCG tablet TAKE 1 TABLET (25 MCG TOTAL) BY MOUTH DAILY BEFORE BREAKFAST. 90 tablet 3   meclizine (ANTIVERT) 25 MG tablet TAKE 1 TABLET BY MOUTH THREE TIMES DAILY AS NEEDED FOR DIZZINESS 30 tablet 0   Multiple Vitamin (MULTIVITAMIN ADULT PO) Take 1 tablet by mouth daily.  omega-3 acid ethyl esters (LOVAZA) 1 G capsule Take 1 g by mouth 2 (two) times daily.     omeprazole (PRILOSEC) 20 MG capsule TAKE 1 CAPSULE EVERY DAY 90 capsule 0   OVER THE COUNTER MEDICATION Take 1 tablet by mouth daily. Fiber Well     QUEtiapine (SEROQUEL) 400 MG tablet Take 2 tablets (800 mg total) by mouth at bedtime. 180 tablet 1   venlafaxine XR (EFFEXOR-XR) 75 MG 24 hr capsule TAKE 3 CAPSULES EVERY DAY 270 capsule 1   No current facility-administered medications on file prior to visit.    BP 120/80    Pulse 96    Temp 98.6 F (37 C) (Oral)    Ht 5\' 4"  (1.626 m)    Wt 204 lb (92.5 kg)    SpO2 96%    BMI 35.02 kg/m       Objective:   Physical Exam Vitals and nursing note reviewed.  Constitutional:      Appearance: Normal appearance. She is obese.  Cardiovascular:     Rate and Rhythm: Normal rate and regular rhythm.     Pulses: Normal pulses.     Heart sounds: Normal heart sounds.  Pulmonary:     Effort: Pulmonary effort is normal.     Breath sounds: Normal breath sounds.  Musculoskeletal:        General: Tenderness present. No deformity.     Lumbar back: Tenderness and bony tenderness present. No swelling or deformity. Decreased range of motion.       Back:  Skin:    General: Skin is warm and dry.  Neurological:     General: No focal deficit present.     Mental Status: She is alert and oriented to person, place, and time.  Psychiatric:        Mood and Affect: Mood normal.        Behavior: Behavior normal.        Thought Content: Thought content normal.        Judgment: Judgment normal.       Assessment & Plan:  1. Chronic midline low back pain without  sciatica -Encouraged exercise as this is the best for arthritic as well as fibromyalgia pain.  Will check x-ray of lumbar spine.  She does see Dr. Nelva Bush for chronic cervical spine pain - DG Lumbar Spine Complete; Future  2. Obesity (BMI 35.0-39.9 without comorbidity) -I am okay with trialing her on Wegovy if this is covered by her insurance.  She is concerned about being diabetic, we will check her A1c and BMP today. Lab Results  Component Value Date   HGBA1C 5.6 11/30/2020    - Semaglutide-Weight Management 0.25 MG/0.5ML SOAJ; Inject 0.25 mg into the skin once a week.  Dispense: 2 mL; Refill: 0 - Basic Metabolic Panel; Future - Hemoglobin A1c; Future - Hemoglobin I6E - Basic Metabolic Panel  [name], NP  Time of total for care on the day of the encounter 32 minutes. This includes time spent in both face-to-face and non-face-to-face activities including preparing for the visit, reviewing the chart, time spent with patient, evaluation and counseling patient/family/caregiver, coordinating care, and time spent documenting in the chart which was performed on the date of service (06/22/2021). Note: this excludes any time spent performing billable procedures or separate charges (such as time spent counseling smoking cessation); these charges are billed separately.

## 2021-06-23 LAB — HEMOGLOBIN A1C: Hgb A1c MFr Bld: 5.4 % (ref 4.6–6.5)

## 2021-06-23 LAB — BASIC METABOLIC PANEL
BUN: 11 mg/dL (ref 6–23)
CO2: 30 mEq/L (ref 19–32)
Calcium: 9.8 mg/dL (ref 8.4–10.5)
Chloride: 100 mEq/L (ref 96–112)
Creatinine, Ser: 0.98 mg/dL (ref 0.40–1.20)
GFR: 61.9 mL/min (ref >60.00)
Glucose, Bld: 96 mg/dL (ref 70–99)
Potassium: 4.4 mEq/L (ref 3.5–5.1)
Sodium: 138 mEq/L (ref 135–145)

## 2021-06-28 ENCOUNTER — Encounter: Payer: Self-pay | Admitting: Cardiology

## 2021-06-28 ENCOUNTER — Other Ambulatory Visit: Payer: Self-pay

## 2021-06-28 ENCOUNTER — Ambulatory Visit (INDEPENDENT_AMBULATORY_CARE_PROVIDER_SITE_OTHER): Payer: Medicare HMO | Admitting: Cardiology

## 2021-06-28 VITALS — BP 122/64 | HR 84 | Ht 64.0 in | Wt 204.2 lb

## 2021-06-28 DIAGNOSIS — I2510 Atherosclerotic heart disease of native coronary artery without angina pectoris: Secondary | ICD-10-CM

## 2021-06-28 DIAGNOSIS — E1169 Type 2 diabetes mellitus with other specified complication: Secondary | ICD-10-CM

## 2021-06-28 DIAGNOSIS — E785 Hyperlipidemia, unspecified: Secondary | ICD-10-CM

## 2021-06-28 DIAGNOSIS — R9389 Abnormal findings on diagnostic imaging of other specified body structures: Secondary | ICD-10-CM | POA: Diagnosis not present

## 2021-06-28 DIAGNOSIS — I2584 Coronary atherosclerosis due to calcified coronary lesion: Secondary | ICD-10-CM

## 2021-06-28 DIAGNOSIS — M797 Fibromyalgia: Secondary | ICD-10-CM

## 2021-06-28 NOTE — Assessment & Plan Note (Signed)
Tramadol did not touch her pain she states.  Continue to encourage stretching and exercise. ?

## 2021-06-28 NOTE — Patient Instructions (Signed)
Medication Instructions:  ?The current medical regimen is effective;  continue present plan and medications. ? ?*If you need a refill on your cardiac medications before your next appointment, please call your pharmacy* ? ?Follow-Up: ?At [facility], you and your health needs are our priority.  As part of our continuing mission to provide you with exceptional heart care, we have created designated Provider Care Teams.  These Care Teams include your primary Cardiologist (physician) and Advanced Practice Providers (APPs -  Physician Assistants and Nurse Practitioners) who all work together to provide you with the care you need, when you need it. ? ?We recommend signing up for the patient portal called "MyChart".  Sign up information is provided on this After Visit Summary.  MyChart is used to connect with patients for Virtual Visits (Telemedicine).  Patients are able to view lab/test results, encounter notes, upcoming appointments, etc.  Non-urgent messages can be sent to your provider as well.   ?To learn more about what you can do with MyChart, go to [email or web address].   ? ?Your next appointment:   ?1 year(s) ? ?The format for your next appointment:   ?In Person ? ?Provider:   ?Dr Marlou Porch ? ?Thank you for choosing [facility]!! ? ? ? ?

## 2021-06-28 NOTE — Assessment & Plan Note (Signed)
Very mild calcium score of 5.  Minimal calcified plaque in the LAD.  Continue with atorvastatin 40 mg high intensity dose.  Prior LDL 89.  Hemoglobin A1c 5.4.  Creatinine 0.9.  ALT 33.  Thankfully she does not have progressive coronary artery disease as her siblings. ?

## 2021-06-28 NOTE — Assessment & Plan Note (Signed)
Continue with atorvastatin 40 mg, LDL 89.  It would be optimal to be less than 70. ?

## 2021-06-28 NOTE — Progress Notes (Signed)
Cardiology Office Note:    Date:  06/28/2021   ID:  Leslie Benson, DOB 1960-06-05, MRN [id number]  PCP:  [name], NP   [facility] Providers Cardiologist:  None     Referring MD: [name], NP    History of Present Illness:    Leslie Benson is a 61 y.o. female here for follow-up coronary CT scan with significant family history of CAD. Father died of MI, brother had MI, older sister has 4 stents, pacemaker and another sister, younger sister had MI, ICD. Other sister has 4 stents.   Back pain. Fibromyalgia.   Anxiety, stress, pain.  Her favorite color is purple.  Denies chest pain.  Past Medical History:  Diagnosis Date   ADHD    Allergy    Anemia    past hx    Anxiety    Barrett esophagus    Breast nodule    benign   Cervical arthritis 09/22/2013   Depression    Elevated liver enzymes    Fatty liver    Fibromyalgia    Fibromyalgia    GERD (gastroesophageal reflux disease)    H/O hiatal hernia    Headache(784.0)    migraines   High cholesterol    History of UTI    Lymphocytic colitis    Migraines    Pain    Panic attack    Paraesophageal hiatal hernia    repaired 2009   Spinal stenosis    Thyroid disease    Thyroid disease     Past Surgical History:  Procedure Laterality Date   ANKLE SURGERY     CHOLECYSTECTOMY  1980s?   COLONOSCOPY  2010   Buccini   EUS N/A 11/19/2013   Procedure: ESOPHAGEAL ENDOSCOPIC ULTRASOUND (EUS) RADIAL;  Surgeon: Arta Silence, MD;  Location: [facility];  Service: Endoscopy;  Laterality: N/A;   LAPAROSCOPIC NISSEN FUNDOPLICATION  26/33/3545   LAPAROSCOPIC PARAESOPHAGEAL HERNIA REPAIR  03/05/2008   Dr Johney Maine   TUBAL LIGATION  1990s?   UPPER GASTROINTESTINAL ENDOSCOPY  04/05/2018   Scarlette Shorts    VAGINAL HYSTERECTOMY  03/04/2001    Current Medications: Current Meds  Medication Sig   ALPRAZolam (XANAX) 1 MG tablet TAKE 2 TABLETS BY MOUTH THREE TIMES DAILY AS NEEDED FOR ANXIETY   atorvastatin  (LIPITOR) 40 MG tablet TAKE 1 TABLET DAILY (FOLLOW UP WITH DR. Marlou Porch AS NEEDED, FURTHER REFILLS TO BE AUTHORIZED BY PRIMARY CARE PROVIDER)   gabapentin (NEURONTIN) 300 MG capsule Take 3 capsules (900 mg total) by mouth 3 (three) times daily.   levothyroxine (SYNTHROID) 25 MCG tablet TAKE 1 TABLET (25 MCG TOTAL) BY MOUTH DAILY BEFORE BREAKFAST.   meclizine (ANTIVERT) 25 MG tablet TAKE 1 TABLET BY MOUTH THREE TIMES DAILY AS NEEDED FOR DIZZINESS   Multiple Vitamin (MULTIVITAMIN ADULT PO) Take 1 tablet by mouth daily.   omega-3 acid ethyl esters (LOVAZA) 1 G capsule Take 1 g by mouth 2 (two) times daily.   omeprazole (PRILOSEC) 20 MG capsule TAKE 1 CAPSULE EVERY DAY   OVER THE COUNTER MEDICATION Take 1 tablet by mouth daily. Fiber Well   QUEtiapine (SEROQUEL) 400 MG tablet Take 2 tablets (800 mg total) by mouth at bedtime.   venlafaxine XR (EFFEXOR-XR) 75 MG 24 hr capsule TAKE 3 CAPSULES EVERY DAY     Allergies:   Codeine, Compazine [prochlorperazine edisylate], and Sulfa antibiotics   Social History   Socioeconomic History   Marital status: Married    Spouse name: Abe People  Number of children: Not on file   Years of education: Not on file   Highest education level: Not on file  Occupational History   Occupation: disabled  Tobacco Use   Smoking status: Never   Smokeless tobacco: Never  Vaping Use   Vaping Use: Never used  Substance and Sexual Activity   Alcohol use: Not Currently    Comment: wine occassionally   Drug use: No   Sexual activity: Yes    Birth control/protection: None    Comment: n/a partial hysterectomy  Other Topics Concern   Not on file  Social History Narrative   Married    Lived in [place].  Moved to [place] in early 2000s   She is on disability for anxiety    Social Determinants of Health   Financial Resource Strain: Low Risk    Difficulty of Paying Living Expenses: Not very hard  Food Insecurity: No Food Insecurity   Worried About Charity fundraiser in  the Last Year: Never true   Arboriculturist in the Last Year: Never true  Transportation Needs: No Transportation Needs   Lack of Transportation (Medical): No   Lack of Transportation (Non-Medical): No  Physical Activity: Insufficiently Active   Days of Exercise per Week: 5 days   Minutes of Exercise per Session: 20 min  Stress: Stress Concern Present   Feeling of Stress : To some extent  Social Connections: Moderately Isolated   Frequency of Communication with Friends and Family: More than three times a week   Frequency of Social Gatherings with Friends and Family: Once a week   Attends Religious Services: Never   Marine scientist or Organizations: No   Attends Music therapist: Never   Marital Status: Married     Family History: The patient's family history includes Alcohol abuse in an other family member; Alcoholism in her father; Anxiety disorder in her mother; Bone cancer in her mother; Breast cancer in her mother; Colon cancer in her maternal aunt; Colon polyps in her sister; Depression in her mother; Elevated Lipids in an other family member; Heart disease in her father and another family member; High Cholesterol in her father and mother; Obesity in her mother; Thyroid cancer in her mother. There is no history of Stomach cancer, Esophageal cancer, or Rectal cancer.  ROS:   Please see the history of present illness.     All other systems reviewed and are negative.  EKGs/Labs/Other Studies Reviewed:    The following studies were reviewed today: Coronary CT scan 10/02/2019: 1. Coronary calcium score of 5. This was 41 percentile for age and sex matched control.   2. Normal coronary origin with right dominance.   3.  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   4.  Aortic arch atherosclerosis.   5. CAD-RADS 1. Minimal non-obstructive CAD (0-24%). Consider preventive therapy and risk factor modification.  Echocardiogram 09/10/2019:    1. Left  ventricular ejection fraction, by estimation, is 55 to 60%. The  left ventricle has normal function. The left ventricle has no regional  wall motion abnormalities. Left ventricular diastolic parameters are  consistent with Grade I diastolic  dysfunction (impaired relaxation). The average left ventricular global  longitudinal strain is -19.0 %. The global longitudinal strain is normal.   2. Right ventricular systolic function is normal. The right ventricular  size is normal. There is normal pulmonary artery systolic pressure.   3. The mitral valve is normal in structure.  No evidence of mitral valve  regurgitation. No evidence of mitral stenosis.   4. The aortic valve is normal in structure. Aortic valve regurgitation is  not visualized. No aortic stenosis is present.   5. The inferior vena cava is normal in size with greater than 50%  respiratory variability, suggesting right atrial pressure of 3 mmHg.     EKG:  EKG is  ordered today.  The ekg ordered today demonstrates sinus rhythm 84 incomplete right bundle branch block  Recent Labs: 12/02/2020: Magnesium 2.4; TSH 1.564 12/03/2020: ALT 33; Hemoglobin 12.1; Platelets 215 06/22/2021: BUN 11; Creatinine, Ser 0.98; Potassium 4.4; Sodium 138  Recent Lipid Panel    Component Value Date/Time   CHOL 187 11/30/2020 1334   CHOL 171 01/06/2020 0950   TRIG 91.0 11/30/2020 1334   HDL 80.00 11/30/2020 1334   HDL 86 01/06/2020 0950   CHOLHDL 2 11/30/2020 1334   VLDL 18.2 11/30/2020 1334   LDLCALC 89 11/30/2020 1334   LDLCALC 70 01/06/2020 0950   LDLDIRECT 168.0 04/05/2017 1140     Risk Assessment/Calculations:              Physical Exam:    VS:  BP 122/64 (BP Location: Left Arm, Patient Position: Sitting, Cuff Size: Large)    Pulse 84    Ht '5\' 4"'$  (1.626 m)    Wt 204 lb 3.2 oz (92.6 kg)    SpO2 98%    BMI 35.05 kg/m     Wt Readings from Last 3 Encounters:  06/28/21 204 lb 3.2 oz (92.6 kg)  06/22/21 204 lb (92.5 kg)  12/09/20 196 lb  (88.9 kg)     GEN:  Well nourished, well developed in no acute distress HEENT: Normal NECK: No JVD; No carotid bruits LYMPHATICS: No lymphadenopathy CARDIAC: RRR, no murmurs, no rubs, gallops RESPIRATORY:  Clear to auscultation without rales, wheezing or rhonchi  ABDOMEN: Soft, non-tender, non-distended MUSCULOSKELETAL:  No edema; No deformity  SKIN: Warm and dry NEUROLOGIC:  Alert and oriented x 3 PSYCHIATRIC:  Normal affect   ASSESSMENT:    1. Abnormal findings on diagnostic imaging of cardiovascular system   2. Coronary artery calcification   3. Hyperlipidemia associated with type 2 diabetes mellitus ([place])   4. Fibromyalgia    PLAN:    In order of problems listed above:  Coronary artery calcification Very mild calcium score of 5.  Minimal calcified plaque in the LAD.  Continue with atorvastatin 40 mg high intensity dose.  Prior LDL 89.  Hemoglobin A1c 5.4.  Creatinine 0.9.  ALT 33.  Thankfully she does not have progressive coronary artery disease as her siblings.  Hyperlipidemia associated with type 2 diabetes mellitus ([place]) Continue with atorvastatin 40 mg, LDL 89.  It would be optimal to be less than 70.  Fibromyalgia Tramadol did not touch her pain she states.  Continue to encourage stretching and exercise.        Medication Adjustments/Labs and Tests Ordered: Current medicines are reviewed at length with the patient today.  Concerns regarding medicines are outlined above.  Orders Placed This Encounter  Procedures   EKG 12-Lead   No orders of the defined types were placed in this encounter.   Patient Instructions  Medication Instructions:  The current medical regimen is effective;  continue present plan and medications.  *If you need a refill on your cardiac medications before your next appointment, please call your pharmacy*  Follow-Up: At [facility], you and your health needs are our priority.  As part of our continuing mission to provide you with  exceptional heart care, we have created designated Provider Care Teams.  These Care Teams include your primary Cardiologist (physician) and Advanced Practice Providers (APPs -  Physician Assistants and Nurse Practitioners) who all work together to provide you with the care you need, when you need it.  We recommend signing up for the patient portal called "MyChart".  Sign up information is provided on this After Visit Summary.  MyChart is used to connect with patients for Virtual Visits (Telemedicine).  Patients are able to view lab/test results, encounter notes, upcoming appointments, etc.  Non-urgent messages can be sent to your provider as well.   To learn more about what you can do with MyChart, go to [email or web address].    Your next appointment:   1 year(s)  The format for your next appointment:   In Person  Provider:   Dr Marlou Porch  Thank you for choosing [facility]!!      Signed, Candee Furbish, MD  06/28/2021 2:21 PM    [place]

## 2021-06-29 LAB — HM DIABETES EYE EXAM

## 2021-06-30 ENCOUNTER — Other Ambulatory Visit: Payer: Self-pay | Admitting: Adult Health

## 2021-06-30 DIAGNOSIS — F410 Panic disorder [episodic paroxysmal anxiety] without agoraphobia: Secondary | ICD-10-CM

## 2021-06-30 DIAGNOSIS — G4700 Insomnia, unspecified: Secondary | ICD-10-CM

## 2021-07-01 ENCOUNTER — Other Ambulatory Visit: Payer: Self-pay | Admitting: Adult Health

## 2021-07-01 DIAGNOSIS — F410 Panic disorder [episodic paroxysmal anxiety] without agoraphobia: Secondary | ICD-10-CM

## 2021-07-01 DIAGNOSIS — G4700 Insomnia, unspecified: Secondary | ICD-10-CM

## 2021-07-20 ENCOUNTER — Ambulatory Visit: Payer: Medicare HMO | Admitting: Internal Medicine

## 2021-07-20 ENCOUNTER — Telehealth: Payer: Self-pay | Admitting: Internal Medicine

## 2021-07-20 NOTE — Telephone Encounter (Signed)
Sent a message via my chart today 07-20-21 at 1:58pm to cancell her 3:20pm appointment. This is her second same day cancellation. ?

## 2021-07-22 ENCOUNTER — Encounter: Payer: Self-pay | Admitting: Adult Health

## 2021-07-26 ENCOUNTER — Telehealth: Payer: Self-pay | Admitting: Adult Health

## 2021-07-26 NOTE — Telephone Encounter (Signed)
Patient called in to ask Tommi Rumps to be taken off gabapentin (NEURONTIN) 300 MG capsule [[id number]]  medication because it isn't working for her. Patient is also asking to be prescribed something else for her fibromyalgia.  ? ?Please advise. ?

## 2021-07-27 ENCOUNTER — Encounter: Payer: Self-pay | Admitting: Adult Health

## 2021-07-27 ENCOUNTER — Telehealth (INDEPENDENT_AMBULATORY_CARE_PROVIDER_SITE_OTHER): Payer: Medicare HMO | Admitting: Adult Health

## 2021-07-27 VITALS — Ht 64.0 in | Wt 204.0 lb

## 2021-07-27 DIAGNOSIS — M797 Fibromyalgia: Secondary | ICD-10-CM

## 2021-07-27 MED ORDER — DULOXETINE HCL 30 MG PO CPEP: 30.0000 mg | ORAL_CAPSULE | Freq: Every day | ORAL | 0 refills | Status: DC

## 2021-07-27 NOTE — Telephone Encounter (Signed)
Pt has been scheduled for phone visit ?

## 2021-07-27 NOTE — Progress Notes (Signed)
Virtual Visit via Video Note ? ?I connected with Leslie Benson on 07/27/21 at  3:15 PM EDT by a video enabled telemedicine application and verified that I am speaking with the correct person using two identifiers. ? Location patient: home ?Location provider:work or home office ?Persons participating in the virtual visit: patient, provider ? ?I discussed the limitations of evaluation and management by telemedicine and the availability of in person appointments. The patient expressed understanding and agreed to proceed. ? ? ?HPI: ?61 year old female who is being evaluated today for chronic fibromyalgia pain.  She does not feel as though gabapentin is working well for her.  She would like to come off this medication and start something new.  She reports worsening joint pain throughout her body.  She does not do a lot of exercising due to the joint pain.  Has not noticed any rednessor warmth to her joints ? ? ?ROS: See pertinent positives and negatives per HPI. ? ?Past Medical History:  ?Diagnosis Date  ? ADHD   ? Allergy   ? Anemia   ? past hx   ? Anxiety   ? Barrett esophagus   ? Breast nodule   ? benign  ? Cervical arthritis 09/22/2013  ? Depression   ? Elevated liver enzymes   ? Fatty liver   ? Fibromyalgia   ? Fibromyalgia   ? GERD (gastroesophageal reflux disease)   ? H/O hiatal hernia   ? Headache(784.0)   ? migraines  ? High cholesterol   ? History of UTI   ? Lymphocytic colitis   ? Migraines   ? Pain   ? Panic attack   ? Paraesophageal hiatal hernia   ? repaired 2009  ? Spinal stenosis   ? Thyroid disease   ? Thyroid disease   ? ? ?Past Surgical History:  ?Procedure Laterality Date  ? ANKLE SURGERY    ? CHOLECYSTECTOMY  1980s?  ? COLONOSCOPY  2010  ? Buccini  ? EUS N/A 11/19/2013  ? Procedure: ESOPHAGEAL ENDOSCOPIC ULTRASOUND (EUS) RADIAL;  Surgeon: Arta Silence, MD;  Location: [facility];  Service: Endoscopy;  Laterality: N/A;  ? LAPAROSCOPIC NISSEN FUNDOPLICATION  18/84/1660  ? LAPAROSCOPIC PARAESOPHAGEAL  HERNIA REPAIR  03/05/2008  ? Dr Johney Maine  ? TUBAL LIGATION  1990s?  ? UPPER GASTROINTESTINAL ENDOSCOPY  04/05/2018  ? Scarlette Shorts   ? VAGINAL HYSTERECTOMY  03/04/2001  ? ? ?Family History  ?Problem Relation Age of Onset  ? Alcohol abuse Other   ? Elevated Lipids Other   ? Heart disease Other   ? Anxiety disorder Mother   ? Obesity Mother   ? High Cholesterol Mother   ? Thyroid cancer Mother   ? Depression Mother   ? Breast cancer Mother   ? Bone cancer Mother   ? High Cholesterol Father   ? Heart disease Father   ? Alcoholism Father   ? Colon cancer Maternal Aunt   ? Colon polyps Sister   ? Stomach cancer Neg Hx   ? Esophageal cancer Neg Hx   ? Rectal cancer Neg Hx   ? ? ? ? ? ?Current Outpatient Medications:  ?  ALPRAZolam (XANAX) 1 MG tablet, TAKE 2 TABLETS BY MOUTH THREE TIMES DAILY AS NEEDED FOR ANXIETY, Disp: 180 tablet, Rfl: 0 ?  atorvastatin (LIPITOR) 40 MG tablet, TAKE 1 TABLET DAILY (FOLLOW UP WITH DR. Marlou Porch AS NEEDED, FURTHER REFILLS TO BE AUTHORIZED BY PRIMARY CARE PROVIDER), Disp: 60 tablet, Rfl: 11 ?  gabapentin (NEURONTIN) 300 MG capsule,  Take 3 capsules (900 mg total) by mouth 3 (three) times daily., Disp: 810 capsule, Rfl: 1 ?  levothyroxine (SYNTHROID) 25 MCG tablet, TAKE 1 TABLET (25 MCG TOTAL) BY MOUTH DAILY BEFORE BREAKFAST., Disp: 90 tablet, Rfl: 3 ?  meclizine (ANTIVERT) 25 MG tablet, TAKE 1 TABLET BY MOUTH THREE TIMES DAILY AS NEEDED FOR DIZZINESS, Disp: 30 tablet, Rfl: 0 ?  Multiple Vitamin (MULTIVITAMIN ADULT PO), Take 1 tablet by mouth daily., Disp: , Rfl:  ?  omega-3 acid ethyl esters (LOVAZA) 1 G capsule, Take 1 g by mouth 2 (two) times daily., Disp: , Rfl:  ?  omeprazole (PRILOSEC) 20 MG capsule, TAKE 1 CAPSULE EVERY DAY, Disp: 90 capsule, Rfl: 0 ?  OVER THE COUNTER MEDICATION, Take 1 tablet by mouth daily. Fiber Well, Disp: , Rfl:  ?  OZEMPIC, 0.25 OR 0.5 MG/DOSE, 2 MG/1.5ML SOPN, SMARTSIG:0.25 Milligram(s) SUB-Q Once a Week, Disp: , Rfl:  ?  QUEtiapine (SEROQUEL) 400 MG tablet, Take 2  tablets (800 mg total) by mouth at bedtime., Disp: 180 tablet, Rfl: 1 ?  venlafaxine XR (EFFEXOR-XR) 75 MG 24 hr capsule, TAKE 3 CAPSULES EVERY DAY, Disp: 270 capsule, Rfl: 1 ? ?EXAM: ? ?VITALS per patient if applicable: ? ?GENERAL: alert, oriented, appears well and in no acute distress ? ?HEENT: atraumatic, conjunttiva clear, no obvious abnormalities on inspection of external nose and ears ? ?NECK: normal movements of the head and neck ? ?LUNGS: on inspection no signs of respiratory distress, breathing rate appears normal, no obvious gross SOB, gasping or wheezing ? ?CV: no obvious cyanosis ? ?MS: moves all visible extremities without noticeable abnormality ? ?PSYCH/NEURO: pleasant and cooperative, no obvious depression or anxiety, speech and thought processing grossly intact ? ?ASSESSMENT AND PLAN: ? ?Discussed the following assessment and plan: ? ?1. Fibromyalgia ?-We will have her wean off of the tendon and trial her on Cymbalta.  We can increase Cymbalta to 60 mg in the next few weeks if not getting adequate relief. ?- DULoxetine (CYMBALTA) 30 MG capsule; Take 1 capsule (30 mg total) by mouth daily.  Dispense: 90 capsule; Refill: 0 ? ? ? ?  ?I discussed the assessment and treatment plan with the patient. The patient was provided an opportunity to ask questions and all were answered. The patient agreed with the plan and demonstrated an understanding of the instructions. ?  ?The patient was advised to call back or seek an in-person evaluation if the symptoms worsen or if the condition fails to improve as anticipated. ? ? ?[name], NP  ? ?

## 2021-08-01 ENCOUNTER — Other Ambulatory Visit: Payer: Self-pay | Admitting: Adult Health

## 2021-08-01 DIAGNOSIS — F410 Panic disorder [episodic paroxysmal anxiety] without agoraphobia: Secondary | ICD-10-CM

## 2021-08-01 DIAGNOSIS — G4700 Insomnia, unspecified: Secondary | ICD-10-CM

## 2021-08-02 ENCOUNTER — Telehealth: Payer: Self-pay | Admitting: Adult Health

## 2021-08-02 NOTE — Telephone Encounter (Signed)
I told the pt that Traci was working to see if a PA is needed for her to get Adderall 180# vs the 150# that the insurance allows.  The original request was just done earlier this morning.  She says she has enough for one more day.  She wonders if she should go ahead and get the 150# or wait to see if you get a response about the PA.  Pls call her back. ? ?Next appt. 6/21 ?

## 2021-08-02 NOTE — Telephone Encounter (Signed)
Pt called reporting new insurance [employer] will only pay for #150 Xanax. She takes #180 for 3 mos.Need PA? [facility]    ?

## 2021-08-02 NOTE — Telephone Encounter (Signed)
Submitted PA on line with cover my meds but response was no PA is needed. I need a phone # on back of her card because of a quantity issue. Will check on that in the morning.  ?

## 2021-08-05 NOTE — Telephone Encounter (Signed)
Contacted [facility] [phone], completed quantity #180 for 30 day, clinical questions answered and APPROVAL received effective 04/24/2021-04/23/2022 PA#[id number] ? ? ? ?Pt picked up #150 on 08/03/2021 so she is unable to get anymore now until closer to refill.  ?

## 2021-08-05 NOTE — Telephone Encounter (Signed)
Noted. Ty!

## 2021-08-05 NOTE — Telephone Encounter (Signed)
See other phone message with PA approval ?

## 2021-08-16 ENCOUNTER — Telehealth: Payer: Self-pay | Admitting: Pharmacist

## 2021-08-16 NOTE — Chronic Care Management (AMB) (Signed)
? ? ?  Chronic Care Management ?Pharmacy Assistant  ? ?Name: Leslie Benson  MRN: [id number] DOB: [birth year] ? ?08/16/21 APPOINTMENT REMINDER ? ? ?Called Patient No answer, left message of appointment on 08/17/21 at 4 via telephone visit with Jeni Salles, Pharm D.  ? ?Notified to have all medications, supplements, blood pressure and/or blood sugar logs available during appointment and to return call if need to reschedule. ? ?  ? ? ? ?Care Gaps: ?Mammogram - Overdue ?COVID Booster - Overdue ?Zoster Vaccine - Overdue ?AWV- 8/22 ? ?Star Rating Drug: ?Atorvastatin (Lipitor) 40 mg - Last filled 06/01/21 60 DS at [facility] ?Semaglutide 0.25/0.5 Last filled 06/22/21 56 DS at [facility] ? ? ? ?Medications: ?Outpatient Encounter Medications as of 08/16/2021  ?Medication Sig  ? ALPRAZolam (XANAX) 1 MG tablet TAKE 2 TABLETS BY MOUTH THREE TIMES DAILY AS NEEDED FOR ANXIETY  ? atorvastatin (LIPITOR) 40 MG tablet TAKE 1 TABLET DAILY (FOLLOW UP WITH DR. Marlou Porch AS NEEDED, FURTHER REFILLS TO BE AUTHORIZED BY PRIMARY CARE PROVIDER)  ? DULoxetine (CYMBALTA) 30 MG capsule Take 1 capsule (30 mg total) by mouth daily.  ? gabapentin (NEURONTIN) 300 MG capsule Take 3 capsules (900 mg total) by mouth 3 (three) times daily.  ? levothyroxine (SYNTHROID) 25 MCG tablet TAKE 1 TABLET (25 MCG TOTAL) BY MOUTH DAILY BEFORE BREAKFAST.  ? meclizine (ANTIVERT) 25 MG tablet TAKE 1 TABLET BY MOUTH THREE TIMES DAILY AS NEEDED FOR DIZZINESS  ? Multiple Vitamin (MULTIVITAMIN ADULT PO) Take 1 tablet by mouth daily.  ? omega-3 acid ethyl esters (LOVAZA) 1 G capsule Take 1 g by mouth 2 (two) times daily.  ? omeprazole (PRILOSEC) 20 MG capsule TAKE 1 CAPSULE EVERY DAY  ? OVER THE COUNTER MEDICATION Take 1 tablet by mouth daily. Fiber Well  ? OZEMPIC, 0.25 OR 0.5 MG/DOSE, 2 MG/1.5ML SOPN SMARTSIG:0.25 Milligram(s) SUB-Q Once a Week  ? QUEtiapine (SEROQUEL) 400 MG tablet Take 2 tablets (800 mg total) by mouth at bedtime.  ? venlafaxine XR (EFFEXOR-XR) 75 MG 24 hr capsule  TAKE 3 CAPSULES EVERY DAY  ? ?No facility-administered encounter medications on file as of 08/16/2021.  ? ? ?Ned Clines CMA ?Clinical Pharmacist Assistant ?[phone] ? ?

## 2021-08-17 ENCOUNTER — Ambulatory Visit (INDEPENDENT_AMBULATORY_CARE_PROVIDER_SITE_OTHER): Payer: Medicare HMO | Admitting: Pharmacist

## 2021-08-17 DIAGNOSIS — E669 Obesity, unspecified: Secondary | ICD-10-CM

## 2021-08-17 DIAGNOSIS — F419 Anxiety disorder, unspecified: Secondary | ICD-10-CM

## 2021-08-17 NOTE — Progress Notes (Signed)
? ?Chronic Care Management ?Pharmacy Note ? ?08/19/2021 ?Name:  Leslie Benson MRN:  [id number] DOB:  Nov 22, 1960 ? ?Summary: ?Pt is still feeling stressed and isn't sleeping well ? ?Recommendations/Changes made from today's visit: ?-Recommended stopping duloxetine due to duplication of therapy with venlafaxine and consider pregabalin ?-Recommended discussing with psychiatrist about switching to long acting benzo to avoid rebound anxiety ?-Recommended connecting with counselor/therapist ?-Recommended taking 2 of the venlafaxine in the morning and 1 in the afternoon to lessen possible risk of insomnia ? ?Plan: ?Follow up anxiety/sleep assessment in 1 month ? ? ?Subjective: ?Leslie Benson is an 61 y.o. year old female who is a primary patient of Nafziger, Tommi Rumps, NP.  The CCM team was consulted for assistance with disease management and care coordination needs.   ? ?Engaged with patient by telephone for follow up visit in response to provider referral for pharmacy case management and/or care coordination services.  ? ?Consent to Services:  ?The patient was given information about Chronic Care Management services, agreed to services, and gave verbal consent prior to initiation of services.  Please see initial visit note for detailed documentation.  ? ?Patient Care Team: ?[name], NP as PCP - General (Family Medicine) ?Mayra Neer, MD (Family Medicine) ?Ronald Lobo, MD as Consulting Physician (Gastroenterology) ?Barbaraann Rondo, MD as Consulting Physician (Gynecology) ?Ronald Lobo, MD as Consulting Physician (Gastroenterology) ?Barbaraann Rondo, MD as Consulting Physician (Gynecology) ?[name], [facility] as Pharmacist (Pharmacist) ? ?Recent office visits: ?07/27/21 [name], NP: Patient presented for video visit for fibromyalgia. Tapered gabapentin and started Cymbalta 30 mg with plans to increased to 60 mg in the next few weeks. ? ?06/22/21 [name], NP: Patient presented for low back  pain and depression related to obesity and weight gain. Prescribed Wegovy for weight loss. ? ?Recent consult visits: ?06/28/21 Candee Furbish, MD (cardiology): Patient presented for hyperlipidemia follow up. No medication changes. ? ?06/16/21 [name], [name] NP [facility]) - Patient presented via Tele-health for panic attacks. ? ?Hospital visits: ?Medication Reconciliation was completed by comparing discharge summary, patient?s EMR and Pharmacy list, and upon discussion with patient. ?  ?[facility] on 12/01/20 due to Orthostatic hypotension. Patient was present for 3 days. ?  ?New?Medications Started at [facility] Discharge:?? ?-started  ?meclizine (ANTIVERT ?  ?Medication Changes at Hospital Discharge: ?-Changed  ?how you take: ?gabapentin (Neurontin ?  ?Medications Discontinued at Hospital Discharge: ?-Stopped ?Pfizer-BioNT COVID-19 Vac-TriS Susp injection (COVID-19 mRNA Vac-TriS [employer])) ?  ?Medications that remain the same after Hospital Discharge:??  ?-All other medications will remain the same.   ?  ? ? ?Objective: ? ?Lab Results  ?Component Value Date  ? CREATININE 0.98 06/22/2021  ? BUN 11 06/22/2021  ? GFR 61.90 06/22/2021  ? GFRNONAA >60 12/03/2020  ? GFRAA 67 10/01/2019  ? NA 138 06/22/2021  ? K 4.4 06/22/2021  ? CALCIUM 9.8 06/22/2021  ? CO2 30 06/22/2021  ? GLUCOSE 96 06/22/2021  ? ? ?Lab Results  ?Component Value Date/Time  ? HGBA1C 5.4 06/22/2021 02:39 PM  ? HGBA1C 5.6 11/30/2020 01:34 PM  ? GFR 61.90 06/22/2021 02:39 PM  ? GFR 67.93 11/30/2020 01:34 PM  ?  ?Last diabetic Eye exam:  ?Lab Results  ?Component Value Date/Time  ? HMDIABEYEEXA No Retinopathy 06/29/2021 12:00 AM  ?  ?Last diabetic Foot exam: No results found for: HMDIABFOOTEX  ? ?Lab Results  ?Component Value Date  ? CHOL 187 11/30/2020  ? HDL 80.00 11/30/2020  ? [place] 89  11/30/2020  ? LDLDIRECT 168.0 04/05/2017  ? TRIG 91.0 11/30/2020  ? CHOLHDL 2 11/30/2020  ? ? ? ?  Latest Ref Rng & Units 12/03/2020  ?  4:43  AM 12/01/2020  ? 10:15 PM 11/30/2020  ?  1:34 PM  ?Hepatic Function  ?Total Protein 6.5 - 8.1 g/dL 6.3   6.9   7.0    ?Albumin 3.5 - 5.0 g/dL 3.9   4.3   4.5    ?AST 15 - 41 U/L 21   43   24    ?ALT 0 - 44 U/L 33   44   35    ?Alk Phosphatase 38 - 126 U/L 94   105   121    ?Total Bilirubin 0.3 - 1.2 mg/dL 0.4   0.4   0.3    ? ? ?Lab Results  ?Component Value Date/Time  ? TSH 1.564 12/02/2020 12:18 PM  ? TSH 1.48 11/30/2020 01:34 PM  ? TSH 1.44 10/28/2019 01:59 PM  ? FREET4 0.83 01/23/2018 11:42 AM  ? FREET4 0.81 09/21/2013 10:15 PM  ? ? ? ?  Latest Ref Rng & Units 12/03/2020  ?  4:43 AM 12/02/2020  ? 12:18 PM 12/01/2020  ? 10:26 PM  ?CBC  ?WBC 4.0 - 10.5 K/uL 5.3   5.0     ?Hemoglobin 12.0 - 15.0 g/dL 12.1   13.8   12.6    ?Hematocrit 36.0 - 46.0 % 37.9   43.0   37.0    ?Platelets 150 - 400 K/uL 215   216     ? ? ?Lab Results  ?Component Value Date/Time  ? VD25OH 45.4 05/29/2018 01:10 PM  ? VD25OH 41.24 10/30/2017 03:14 PM  ? ? ?Clinical ASCVD: No  ?The 10-year ASCVD risk score (Arnett DK, et al., 2019) is: 5.5% ?  Values used to calculate the score: ?    Age: 65 years ?    Sex: Female ?    Is Non-Hispanic African American: No ?    Diabetic: Yes ?    Tobacco smoker: No ?    Systolic Blood Pressure: 149 mmHg ?    Is BP treated: No ?    HDL Cholesterol: 80 mg/dL ?    Total Cholesterol: 187 mg/dL   ? ? ?  07/27/2021  ?  3:13 PM 11/30/2020  ?  3:24 PM 11/30/2020  ? 12:58 PM  ?Depression screen PHQ 2/9  ?Decreased Interest 3 0 0  ?Down, Depressed, Hopeless 3 1 1   ?PHQ - 2 Score 6 1 1   ?Altered sleeping 0    ?Tired, decreased energy 2    ?Change in appetite 0    ?Feeling bad or failure about yourself  0    ?Trouble concentrating 1    ?Moving slowly or fidgety/restless 0    ?Suicidal thoughts 0    ?PHQ-9 Score 9    ?Difficult doing work/chores Not difficult at all    ?  ? ? ?Social History  ? ?Tobacco Use  ?Smoking Status Never  ?Smokeless Tobacco Never  ? ?BP Readings from Last 3 Encounters:  ?06/28/21 122/64  ?06/22/21 120/80   ?12/09/20 100/80  ? ?Pulse Readings from Last 3 Encounters:  ?06/28/21 84  ?06/22/21 96  ?12/09/20 92  ? ?Wt Readings from Last 3 Encounters:  ?07/27/21 204 lb (92.5 kg)  ?06/28/21 204 lb 3.2 oz (92.6 kg)  ?06/22/21 204 lb (92.5 kg)  ? ?BMI Readings from Last 3 Encounters:  ?07/27/21 35.02 kg/m?  ?06/28/21  35.05 kg/m?  ?06/22/21 35.02 kg/m?  ? ? ?Assessment/Interventions: Review of patient past medical history, allergies, medications, health status, including review of consultants reports, laboratory and other test data, was performed as part of comprehensive evaluation and provision of chronic care management services.  ? ?SDOH:  (Social Determinants of Health) assessments and interventions performed: No ? ? ?SDOH Screenings  ? ?Alcohol Screen: Not on file  ?Depression (PHQ2-9): Medium Risk  ? PHQ-2 Score: 9  ?Financial Resource Strain: Low Risk   ? Difficulty of Paying Living Expenses: Not very hard  ?Food Insecurity: No Food Insecurity  ? Worried About Charity fundraiser in the Last Year: Never true  ? Ran Out of Food in the Last Year: Never true  ?Housing: Low Risk   ? Last Housing Risk Score: 0  ?Physical Activity: Insufficiently Active  ? Days of Exercise per Week: 5 days  ? Minutes of Exercise per Session: 20 min  ?Social Connections: Moderately Isolated  ? Frequency of Communication with Friends and Family: More than three times a week  ? Frequency of Social Gatherings with Friends and Family: Once a week  ? Attends Religious Services: Never  ? Active Member of Clubs or Organizations: No  ? Attends Archivist Meetings: Never  ? Marital Status: Married  ?Stress: Stress Concern Present  ? Feeling of Stress : To some extent  ?Tobacco Use: Low Risk   ? Smoking Tobacco Use: Never  ? Smokeless Tobacco Use: Never  ? Passive Exposure: Not on file  ?Transportation Needs: No Transportation Needs  ? Lack of Transportation (Medical): No  ? Lack of Transportation (Non-Medical): No  ? ? ?CCM Care  Plan ? ?Allergies  ?Allergen Reactions  ? Codeine Nausea And Vomiting  ? Compazine [Prochlorperazine Edisylate]   ?  "feels weird"  ? Sulfa Antibiotics   ?  Unknown reaction.  ? ? ?Medications Reviewed Today   ? ? [place]

## 2021-08-19 ENCOUNTER — Other Ambulatory Visit: Payer: Self-pay | Admitting: Adult Health

## 2021-08-19 ENCOUNTER — Telehealth: Payer: Self-pay | Admitting: Pharmacist

## 2021-08-19 MED ORDER — PREGABALIN 75 MG PO CAPS: 75.0000 mg | ORAL_CAPSULE | Freq: Two times a day (BID) | ORAL | 2 refills | Status: DC

## 2021-08-19 MED ORDER — OZEMPIC (0.25 OR 0.5 MG/DOSE) 2 MG/1.5ML ~~LOC~~ SOPN: 0.5000 mg | PEN_INJECTOR | SUBCUTANEOUS | 0 refills | Status: AC

## 2021-08-19 NOTE — Telephone Encounter (Signed)
Called patient to let her know of the switch from duloxetine to pregabalin. Discussed side effects with pregabalin. Scheduled a follow up with PCP for 1 month to determine tolerance and assess efficacy. Patient verbalized her understanding. ? ?Also made her aware of the dose increase to Ozempic 0.5 mg. Patient already took her 0.25 mg today and will start on the higher dose of 0.5 mg next Friday.  ?

## 2021-08-19 NOTE — Patient Instructions (Addendum)
Hi Rayma, ? ?It was great to catch up with you again! I'll let you know what Tommi Rumps says as soon as I talk to him. ? ?Don't forget to switch your venlafaxine to 2 in the morning and 1 in the afternoon to see if this helps improve your sleep or you can try to take them all at once in the morning. ? ?Please reach out to me if you have any questions! ? ?Best, ?Maddie ? ?Jeni Salles, PharmD, BCACP ?Clinical Pharmacist ?Therapist, music at [place] ?[phone] ? ? Visit Information ? ? Goals Addressed   ?None ?  ? ?Patient Care Plan: [place]  ?  ? ?Problem Identified: Problem: Hyperlipidemia, Diabetes, GERD, Hypothyroidism, Depression, Anxiety, and Fibromyalgia   ?  ? ?Long-Range Goal: Patient-Specific Goal   ?Start Date: 04/20/2021  ?Expected End Date: 04/20/2022  ?Recent Progress: On track  ?Priority: High  ?Note:   ?Current Barriers:  ?Unable to independently monitor therapeutic efficacy ? ?Pharmacist Clinical Goal(s):  ?Patient will achieve adherence to monitoring guidelines and medication adherence to achieve therapeutic efficacy through collaboration with PharmD and provider.  ? ?Interventions: ?1:1 collaboration with [name], NP regarding development and update of comprehensive plan of care as evidenced by provider attestation and co-signature ?Inter-disciplinary care team collaboration (see longitudinal plan of care) ?Comprehensive medication review performed; medication list updated in electronic medical record ? ?Hyperlipidemia: (LDL goal < 100) ?-Controlled ?-Current treatment: ?Atorvastatin 40 mg 1 tablet daily - Appropriate, Effective, Safe, Accessible ?Lovaza 1 g 1 capsule twice daily - Appropriate, Effective, Safe, Accessible ?-Medications previously tried: none  ?-Current dietary patterns: did not discuss ?-Current exercise habits: not exercising right now ?-Educated on Cholesterol goals;  ?Benefits of statin for ASCVD risk reduction; ?Exercise goal of 150 minutes per  week; ?-Counseled on diet and exercise extensively ?Recommended to continue current medication ? ?Pre-diabetes/weight loss (A1c goal <6.5%) ?-Controlled ?-Current medications: ?Ozempic 0.25 mg inject once weekly - Appropriate, Query effective, Safe, Accessible ?-Medications previously tried: metformin  ?-Current home glucose readings ?fasting glucose: does not need to check ?post prandial glucose: does not need to check ?-Denies hypoglycemic/hyperglycemic symptoms ?-Current meal patterns:  ?breakfast: n/a  ?lunch: n/a  ?dinner: n/a ?snacks: n/a ?drinks: n/a ?-Current exercise: does not exercise ?-Educated on A1c and blood sugar goals; ?-Counseled to check feet daily and get yearly eye exams ?-Counseled on diet and exercise extensively ?Recommended increasing to 0.5 mg weekly for a therapeutic dose. ? ?Depression/Anxiety (Goal: minimize symptoms) ?-Not ideally controlled ?-Current treatment: ?Alprazolam 1 mg 2 tablets three times daily as needed (taking 2 every 6 hours) - Appropriate, Effective, Query Safe, Accessible ?Quetiapine 400 mg 2 tablets at bedtime - Appropriate, Query effective, Safe, Accessible ?Venlafaxine XR 75 mg 3 capsules daily (taking 3 times a day) - Appropriate, Effective, Query Safe, Accessible  ?-Medications previously tried/failed: unknown ?-PHQ9: 1 ?-GAD7: n/a ?-Educated on Benefits of medication for symptom control ?Benefits of cognitive-behavioral therapy with or without medication ?-Recommended to continue current medication ?Counseled on rebound anxiety with alprazolam and recommended discussing with psychiatrist about switching to longer acting benzo. ?Recommended taking 2 of the venlafaxine in the morning and 1 in the afternoon to lessen possible risk of insomnia.  ? ?GERD (Goal: minimize symptoms) ?-Controlled ?-Current treatment  ?Omeprazole 20 mg 1 capsule daily as needed - Appropriate, Effective, Safe, Accessible ?-Medications previously tried: none  ?-Counseled on taking it every  night prior to when heartburn is bothering her. ? ?Fibromyalgia (Goal: minimize pain) ?-Controlled ?-Current treatment  ?Duloxetine  30 mg 1 capsule daily - Appropriate, Query effective, Query Safe, Accessible ?-Medications previously tried: gabapentin  ?-Recommended switching duloxetine to alternative agent to avoid duplication of therapy with venlafaxine. ? ?Hypothyroidism (Goal: 0.35-4.5) ?-Controlled ?-Current treatment  ?Levothyroxine 25 mcg 1 tablet daily - Appropriate, Effective, Safe, Accessible ?-Medications previously tried: none  ?-Counseled on importance of separating from other medications on an empty stomach. ? ?Health Maintenance ?-Vaccine gaps: influenza, prevnar, shingrix, COVID booster ?-Current therapy:  ?Meclizine 25 mg as needed ?Fiberwell daily ?-Educated on Cost vs benefit of each product must be carefully weighed by individual consumer ?Supplements may interfere with prescription drugs ?-Patient is satisfied with current therapy and denies issues ?-Recommended to continue current medication ? ?Patient Goals/Self-Care Activities ?Patient will:  ?- take medications as prescribed as evidenced by patient report and record review ?target a minimum of 150 minutes of moderate intensity exercise weekly ? ?Follow Up Plan: Telephone follow up appointment with care management team member scheduled for: 4 months ? ?  ?  ? ?Patient verbalizes understanding of instructions and care plan provided today and agrees to view in [place]. Active MyChart status confirmed with patient.   ?The pharmacy team will reach out to the patient again over the next 7 days.  ? ?[name], RPH  ?

## 2021-08-21 DIAGNOSIS — E039 Hypothyroidism, unspecified: Secondary | ICD-10-CM | POA: Diagnosis not present

## 2021-08-21 DIAGNOSIS — R69 Illness, unspecified: Secondary | ICD-10-CM | POA: Diagnosis not present

## 2021-08-21 DIAGNOSIS — F32A Depression, unspecified: Secondary | ICD-10-CM

## 2021-08-22 DIAGNOSIS — Z6832 Body mass index (BMI) 32.0-32.9, adult: Secondary | ICD-10-CM | POA: Diagnosis not present

## 2021-08-22 DIAGNOSIS — G629 Polyneuropathy, unspecified: Secondary | ICD-10-CM | POA: Diagnosis not present

## 2021-08-22 DIAGNOSIS — E785 Hyperlipidemia, unspecified: Secondary | ICD-10-CM | POA: Diagnosis not present

## 2021-08-22 DIAGNOSIS — E669 Obesity, unspecified: Secondary | ICD-10-CM | POA: Diagnosis not present

## 2021-08-22 DIAGNOSIS — Z803 Family history of malignant neoplasm of breast: Secondary | ICD-10-CM | POA: Diagnosis not present

## 2021-08-22 DIAGNOSIS — Z008 Encounter for other general examination: Secondary | ICD-10-CM | POA: Diagnosis not present

## 2021-08-22 DIAGNOSIS — H269 Unspecified cataract: Secondary | ICD-10-CM | POA: Diagnosis not present

## 2021-08-22 DIAGNOSIS — K219 Gastro-esophageal reflux disease without esophagitis: Secondary | ICD-10-CM | POA: Diagnosis not present

## 2021-08-22 DIAGNOSIS — Z7985 Long-term (current) use of injectable non-insulin antidiabetic drugs: Secondary | ICD-10-CM | POA: Diagnosis not present

## 2021-08-22 DIAGNOSIS — E039 Hypothyroidism, unspecified: Secondary | ICD-10-CM | POA: Diagnosis not present

## 2021-08-22 DIAGNOSIS — R69 Illness, unspecified: Secondary | ICD-10-CM | POA: Diagnosis not present

## 2021-08-25 ENCOUNTER — Ambulatory Visit: Payer: Medicare HMO | Admitting: Cardiology

## 2021-09-02 ENCOUNTER — Other Ambulatory Visit: Payer: Self-pay

## 2021-09-02 DIAGNOSIS — F410 Panic disorder [episodic paroxysmal anxiety] without agoraphobia: Secondary | ICD-10-CM

## 2021-09-02 DIAGNOSIS — G4700 Insomnia, unspecified: Secondary | ICD-10-CM

## 2021-09-02 MED ORDER — ALPRAZOLAM 1 MG PO TABS: ORAL_TABLET | ORAL | 0 refills | Status: DC

## 2021-09-08 ENCOUNTER — Telehealth: Payer: Self-pay | Admitting: Cardiology

## 2021-09-08 MED ORDER — ATORVASTATIN CALCIUM 40 MG PO TABS: ORAL_TABLET | ORAL | 3 refills | Status: DC

## 2021-09-08 NOTE — Telephone Encounter (Signed)
Pt's medication was sent to pt's pharmacy as requested. Confirmation received.  °

## 2021-09-08 NOTE — Telephone Encounter (Signed)
*  STAT* If patient is at the pharmacy, call can be transferred to refill team.   1. Which medications need to be refilled? (please list name of each medication and dose if known) atorvastatin (LIPITOR) 40 MG tablet  2. Which pharmacy/location (including street and city if local pharmacy) is medication to be sent to?[place], [place]  3. Do they need a 30 day or 90 day supply? [name]

## 2021-09-20 ENCOUNTER — Ambulatory Visit (INDEPENDENT_AMBULATORY_CARE_PROVIDER_SITE_OTHER): Payer: Medicare HMO | Admitting: Adult Health

## 2021-09-20 ENCOUNTER — Encounter: Payer: Self-pay | Admitting: Adult Health

## 2021-09-20 ENCOUNTER — Telehealth: Payer: Medicare HMO | Admitting: Adult Health

## 2021-09-20 VITALS — BP 110/60 | HR 92 | Temp 98.4°F | Ht 64.0 in | Wt 195.0 lb

## 2021-09-20 DIAGNOSIS — M779 Enthesopathy, unspecified: Secondary | ICD-10-CM

## 2021-09-20 DIAGNOSIS — T50905A Adverse effect of unspecified drugs, medicaments and biological substances, initial encounter: Secondary | ICD-10-CM

## 2021-09-20 NOTE — Progress Notes (Signed)
Subjective:    Patient ID: Leslie Benson, female    DOB: [birth year], 61 y.o.   MRN: [id number]  HPI 61 year old female who  has a past medical history of ADHD, Allergy, Anemia, Anxiety, Barrett esophagus, Breast nodule, Cervical arthritis (09/22/2013), Depression, Elevated liver enzymes, Fatty liver, Fibromyalgia, Fibromyalgia, GERD (gastroesophageal reflux disease), H/O hiatal hernia, Headache(784.0), High cholesterol, History of UTI, Lymphocytic colitis, Migraines, Pain, Panic attack, Paraesophageal hiatal hernia, Spinal stenosis, Thyroid disease, and Thyroid disease.  She presents to the office today for multiple issues.   Her first issue is that of acute right-sided shoulder/forearm discomfort.  He reports that this has been present intermittently over the last month.  Pain is described as an aching pain.  Pain is worse when laying in bed using her computer/phone.  Feels as though the pain radiates up her forearm into her shoulder into her back.  She has been using Tylenol arthritis which helps as well as a heating pad on her back which helps.  Additionally, over the last week or so she is developed dizziness/lightheadedness, feeling as though her gait is unstable, questionable palpitations, and some trace lower extremity edema.  She was started on Lyrica 75 mg twice daily roughly 1 month ago for fibromyalgia pain.  She has found this to be more beneficial than gabapentin and Cymbalta.   Review of Systems See HPI   Past Medical History:  Diagnosis Date   ADHD    Allergy    Anemia    past hx    Anxiety    Barrett esophagus    Breast nodule    benign   Cervical arthritis 09/22/2013   Depression    Elevated liver enzymes    Fatty liver    Fibromyalgia    Fibromyalgia    GERD (gastroesophageal reflux disease)    H/O hiatal hernia    Headache(784.0)    migraines   High cholesterol    History of UTI    Lymphocytic colitis    Migraines    Pain    Panic attack     Paraesophageal hiatal hernia    repaired 2009   Spinal stenosis    Thyroid disease    Thyroid disease     Social History   Socioeconomic History   Marital status: Married    Spouse name: Holiday representative   Number of children: Not on file   Years of education: Not on file   Highest education level: Not on file  Occupational History   Occupation: disabled  Tobacco Use   Smoking status: Never   Smokeless tobacco: Never  Vaping Use   Vaping Use: Never used  Substance and Sexual Activity   Alcohol use: Not Currently    Comment: wine occassionally   Drug use: No   Sexual activity: Yes    Birth control/protection: None    Comment: n/a partial hysterectomy  Other Topics Concern   Not on file  Social History Narrative   Married    Lived in [place].  Moved to [place] in early 2000s   She is on disability for anxiety    Social Determinants of Health   Financial Resource Strain: Low Risk    Difficulty of Paying Living Expenses: Not very hard  Food Insecurity: No Food Insecurity   Worried About Charity fundraiser in the Last Year: Never true   Ran Out of Food in the Last Year: Never true  Transportation Needs: No Transportation Needs   Lack of  Transportation (Medical): No   Lack of Transportation (Non-Medical): No  Physical Activity: Insufficiently Active   Days of Exercise per Week: 5 days   Minutes of Exercise per Session: 20 min  Stress: Stress Concern Present   Feeling of Stress : To some extent  Social Connections: Moderately Isolated   Frequency of Communication with Friends and Family: More than three times a week   Frequency of Social Gatherings with Friends and Family: Once a week   Attends Religious Services: Never   Marine scientist or Organizations: No   Attends Music therapist: Never   Marital Status: Married  Human resources officer Violence: Not At Risk   Fear of Current or Ex-Partner: No   Emotionally Abused: No   Physically Abused: No   Sexually  Abused: No    Past Surgical History:  Procedure Laterality Date   ANKLE SURGERY     CHOLECYSTECTOMY  1980s?   COLONOSCOPY  2010   Buccini   EUS N/A 11/19/2013   Procedure: ESOPHAGEAL ENDOSCOPIC ULTRASOUND (EUS) RADIAL;  Surgeon: Arta Silence, MD;  Location: [facility];  Service: Endoscopy;  Laterality: N/A;   LAPAROSCOPIC NISSEN FUNDOPLICATION  70/96/2836   LAPAROSCOPIC PARAESOPHAGEAL HERNIA REPAIR  03/05/2008   Dr Johney Maine   TUBAL LIGATION  1990s?   UPPER GASTROINTESTINAL ENDOSCOPY  04/05/2018   Scarlette Shorts    VAGINAL HYSTERECTOMY  03/04/2001    Family History  Problem Relation Age of Onset   Alcohol abuse Other    Elevated Lipids Other    Heart disease Other    Anxiety disorder Mother    Obesity Mother    High Cholesterol Mother    Thyroid cancer Mother    Depression Mother    Breast cancer Mother    Bone cancer Mother    High Cholesterol Father    Heart disease Father    Alcoholism Father    Colon cancer Maternal Aunt    Colon polyps Sister    Stomach cancer Neg Hx    Esophageal cancer Neg Hx    Rectal cancer Neg Hx     Allergies  Allergen Reactions   Codeine Nausea And Vomiting   Compazine [Prochlorperazine Edisylate]     "feels weird"   Sulfa Antibiotics     Unknown reaction.    Current Outpatient Medications on File Prior to Visit  Medication Sig Dispense Refill   ALPRAZolam (XANAX) 1 MG tablet Take 2 tablets by mouth three times a day as needed for anxiety 180 tablet 0   atorvastatin (LIPITOR) 40 MG tablet TAKE 1 TABLET BY MOUTH DAILY 90 tablet 3   levothyroxine (SYNTHROID) 25 MCG tablet TAKE 1 TABLET (25 MCG TOTAL) BY MOUTH DAILY BEFORE BREAKFAST. 90 tablet 3   meclizine (ANTIVERT) 25 MG tablet TAKE 1 TABLET BY MOUTH THREE TIMES DAILY AS NEEDED FOR DIZZINESS 30 tablet 0   Multiple Vitamin (MULTIVITAMIN ADULT PO) Take 1 tablet by mouth daily.     omega-3 acid ethyl esters (LOVAZA) 1 G capsule Take 1 g by mouth 2 (two) times daily.     omeprazole  (PRILOSEC) 20 MG capsule TAKE 1 CAPSULE EVERY DAY 90 capsule 0   OVER THE COUNTER MEDICATION Take 1 tablet by mouth daily. Fiber Well     OZEMPIC, 0.25 OR 0.5 MG/DOSE, 2 MG/1.5ML SOPN SMARTSIG:0.25 Milligram(s) SUB-Q Once a Week     QUEtiapine (SEROQUEL) 400 MG tablet Take 2 tablets (800 mg total) by mouth at bedtime. 180 tablet 1   venlafaxine  XR (EFFEXOR-XR) 75 MG 24 hr capsule TAKE 3 CAPSULES EVERY DAY 270 capsule 1   pregabalin (LYRICA) 75 MG capsule Take 1 capsule (75 mg total) by mouth 2 (two) times daily. 60 capsule 2   No current facility-administered medications on file prior to visit.    BP 110/60   Pulse 92   Temp 98.4 F (36.9 C) (Oral)   Ht '5\' 4"'$  (1.626 m)   Wt 195 lb (88.5 kg)   SpO2 98%   BMI 33.47 kg/m       Objective:   Physical Exam Vitals and nursing note reviewed.  Constitutional:      Appearance: Normal appearance.  Cardiovascular:     Rate and Rhythm: Normal rate and regular rhythm.     Pulses: Normal pulses.     Heart sounds: Normal heart sounds.  Pulmonary:     Effort: Pulmonary effort is normal.     Breath sounds: Normal breath sounds.  Musculoskeletal:        General: Tenderness present.     Left shoulder: No tenderness, bony tenderness or crepitus. Normal range of motion. Normal strength.     Left upper arm: Tenderness present. No swelling, edema, deformity or bony tenderness.     Left elbow: Normal. Normal range of motion.  Neurological:     General: No focal deficit present.     Mental Status: She is alert and oriented to person, place, and time.  Psychiatric:        Mood and Affect: Mood normal.        Behavior: Behavior normal.        Thought Content: Thought content normal.        Judgment: Judgment normal.       Assessment & Plan:  1. Tendonitis -Soft tissue tenderness throughout the upper arm and shoulder.  No bony tenderness noted -Symptoms seem to be overuse injury/tendinitis.  Encouraged anti-inflammatories and Tylenol as well  as ice is much as possible.  Refrain from using electronics is much as possible over the next couple weeks.  Follow-up if not resolved  2. Adverse effect of drug, initial encounter -Symptoms consistent with side effects of Lyrica.  We will have her try taking a single dose of Lyrica 75 mg at night to see if her symptoms resolved.  She can then try incorporating her daily Lyrica slowly.  Advise follow-up if symptoms continue  [name], NP  Time of total for care on the day of the encounter 30 minutes. This includes time spent in both face-to-face and non-face-to-face activities including preparing for the visit, reviewing the chart, time spent with patient, evaluation and counseling patient/family/caregiver, coordinating care, and time spent documenting in the chart which was performed on the date of service (09/20/2021). Note: this excludes any time spent performing billable procedures or separate charges (such as time spent counseling smoking cessation); these charges are billed separately.

## 2021-09-20 NOTE — Addendum Note (Signed)
Addended by: Apolinar Junes on: 09/20/2021 04:29 PM   Modules accepted: Level of Service

## 2021-09-20 NOTE — Patient Instructions (Addendum)
Try taking a single dose of lyrica at night to see if this helps with your symptoms   Use a heating pad for the back and ice the forearm   Give the electronics a break

## 2021-09-30 ENCOUNTER — Telehealth: Payer: Self-pay | Admitting: Pharmacist

## 2021-09-30 NOTE — Chronic Care Management (AMB) (Unsigned)
Chronic Care Management Pharmacy Assistant   Name: Leslie Benson  MRN: [id number] DOB: Aug 25, 1960  Reason for Encounter: General Assessment   Conditions to be addressed/monitored: Anxiety/ Sleep per MP  Recent office visits:  09/20/21 [name], NP - Patient presented for Tendonitis and other concerns. Stopped Gabapentin  Recent consult visits:  None   Hospital visits:  None in previous 6 months  Medications: Outpatient Encounter Medications as of 09/30/2021  Medication Sig   ALPRAZolam (XANAX) 1 MG tablet Take 2 tablets by mouth three times a day as needed for anxiety   atorvastatin (LIPITOR) 40 MG tablet TAKE 1 TABLET BY MOUTH DAILY   levothyroxine (SYNTHROID) 25 MCG tablet TAKE 1 TABLET (25 MCG TOTAL) BY MOUTH DAILY BEFORE BREAKFAST.   meclizine (ANTIVERT) 25 MG tablet TAKE 1 TABLET BY MOUTH THREE TIMES DAILY AS NEEDED FOR DIZZINESS   Multiple Vitamin (MULTIVITAMIN ADULT PO) Take 1 tablet by mouth daily.   omega-3 acid ethyl esters (LOVAZA) 1 G capsule Take 1 g by mouth 2 (two) times daily.   omeprazole (PRILOSEC) 20 MG capsule TAKE 1 CAPSULE EVERY DAY   OVER THE COUNTER MEDICATION Take 1 tablet by mouth daily. Fiber Well   OZEMPIC, 0.25 OR 0.5 MG/DOSE, 2 MG/1.5ML SOPN SMARTSIG:0.25 Milligram(s) SUB-Q Once a Week   pregabalin (LYRICA) 75 MG capsule Take 1 capsule (75 mg total) by mouth 2 (two) times daily.   QUEtiapine (SEROQUEL) 400 MG tablet Take 2 tablets (800 mg total) by mouth at bedtime.   venlafaxine XR (EFFEXOR-XR) 75 MG 24 hr capsule TAKE 3 CAPSULES EVERY DAY   No facility-administered encounter medications on file as of 09/30/2021.  [facility] for General Review Call   Chart Review:  Have there been any documented new, changed, or discontinued medications since last visit? Yes see above : Gabapentin 900 mg d/c by PCP on 09/20/21 Has there been any documented recent hospitalizations or ED visits since last visit with Clinical Pharmacist?  No    Adherence Review:  Does the Clinical Pharmacist Assistant have access to adherence rates? Yes Adherence rates for STAR metric medications  Atorvastatin (Lipitor) 40 mg - Last filled 09/08/21 90 DS at [facility] Atorvastatin (Lipitor) 40 mg - Last filled 06/01/21 60 DS at [facility] 0.25/0.5 Last filled 08/19/21 28 DS at [facility] Semaglutide 0.25/0.5 Last filled 06/22/21 56 DS at [facility] Adherence rates for medications indicated for disease state being reviewed (List medication(s)/day supply/ last 2 fill dates). Alprazolam 1 mg Last filled 09/02/21 30 DS at [facility] Alprazolam 1 mg Last filled 08/03/21 30 DS at [facility] Quetiapine 400 mg Last filled 06/18/21 90 DS at [facility] Quetiapine 400 mg Last filled 02/22/21 90 DS at [facility] Venlafaxine XR 75 mg Last filled 06/21/21 90 DS at [facility] Venlafaxine XR 75 mg Last filled 04/07/21 90 DS at [facility] Does the patient have >5 day gap between last estimated fill dates for any of the above medications or other medication gaps?       Yes bolded ones verified as accurate Per Pharm Venlafaxine was     filled on 3/31 for 30 DS not as above    Disease State Questions:  Able to connect with Patient? {yes/no:20286} Did patient have any problems with their health recently? {yes/no:20286} Note problems and Concerns: Have you had any admissions or emergency room visits or worsening of your condition(s) since last visit? {yes/no:20286} Details of ED visit, hospital visit and/or worsening condition(s): Have you had any visits with new  specialists or providers since your last visit? {yes/no:20286} Explain: Have you had any new health care problem(s) since your last visit? {yes/no:20286} New problem(s) reported: Have you run out of any of your medications since you last spoke with clinical pharmacist? {yes/no:20286} What caused you to run out of your medications? Are there any medications you are not taking as prescribed? {yes/no:20286} What kept you from  taking your medications as prescribed? Are you having any issues or side effects with your medications? {yes/no:20286} Note of issues or side effects: Do you have any other health concerns or questions you want to discuss with your Clinical Pharmacist before your next visit? {yes/no:20286} Note additional concerns and questions from Patient. Are there any health concerns that you feel we can do a better job addressing? {yes/no:20286} Note Patient's response. Are you having any problems with any of the following since the last visit: (select all that apply)  {General Call:27390}  Details: 12. Any falls since last visit? {yes/no:20286}  Details: 13. Any increased or uncontrolled pain since last visit? {yes/no:20286}  Details: 14. Next visit Type: {Telephone/Office:25179}       Visit with:        Date:        Time:  62. Additional Details? Yes   Notes: Per Jeni Salles inquired on anxiety and sleep recommendations per prior visit she reports  Care Gaps: [place] Zoster Vaccine - Overdue CCM- 8/23 AWV- 8/22   Star Rating Drugs: Atorvastatin (Lipitor) 40 mg - Last filled 09/08/21 60 DS at [facility] filled 08/19/21 28 DS at [facility] Pharmacist Assistant [phone]

## 2021-10-03 ENCOUNTER — Telehealth: Payer: Self-pay | Admitting: Internal Medicine

## 2021-10-03 DIAGNOSIS — K219 Gastro-esophageal reflux disease without esophagitis: Secondary | ICD-10-CM

## 2021-10-03 DIAGNOSIS — K222 Esophageal obstruction: Secondary | ICD-10-CM

## 2021-10-03 DIAGNOSIS — R1310 Dysphagia, unspecified: Secondary | ICD-10-CM

## 2021-10-03 MED ORDER — OMEPRAZOLE 20 MG PO CPDR: 20.0000 mg | DELAYED_RELEASE_CAPSULE | Freq: Every day | ORAL | 0 refills | Status: DC

## 2021-10-03 NOTE — Telephone Encounter (Signed)
Refilled Omeprazole 

## 2021-10-03 NOTE — Telephone Encounter (Signed)
Inbound call from patient requesting medication refill for Omeprazol. Please give patient a call back to advise .  Thank you

## 2021-10-04 ENCOUNTER — Other Ambulatory Visit: Payer: Self-pay

## 2021-10-04 ENCOUNTER — Other Ambulatory Visit: Payer: Self-pay | Admitting: Adult Health

## 2021-10-04 ENCOUNTER — Telehealth: Payer: Self-pay | Admitting: Adult Health

## 2021-10-04 DIAGNOSIS — F410 Panic disorder [episodic paroxysmal anxiety] without agoraphobia: Secondary | ICD-10-CM

## 2021-10-04 DIAGNOSIS — G4700 Insomnia, unspecified: Secondary | ICD-10-CM

## 2021-10-04 DIAGNOSIS — F419 Anxiety disorder, unspecified: Secondary | ICD-10-CM

## 2021-10-04 DIAGNOSIS — E1169 Type 2 diabetes mellitus with other specified complication: Secondary | ICD-10-CM

## 2021-10-04 MED ORDER — ALPRAZOLAM 1 MG PO TABS: ORAL_TABLET | ORAL | 0 refills | Status: DC

## 2021-10-04 NOTE — Telephone Encounter (Signed)
Pt says she only got 30 pills of Alprazolam yesterday from pharmacy.  She was supposed to get 180 pills for the month.  Will someone call to see why she didn't get all her meds?  Next appt 6/21

## 2021-10-04 NOTE — Telephone Encounter (Signed)
Pended.

## 2021-10-04 NOTE — Telephone Encounter (Signed)
Patient did not have a current Rx. In April she got 150 tablets and this was the 30 tablet shortage. A new Rx was pended for 180.

## 2021-10-12 ENCOUNTER — Ambulatory Visit: Payer: Medicare HMO | Admitting: Adult Health

## 2021-10-12 ENCOUNTER — Encounter: Payer: Self-pay | Admitting: Adult Health

## 2021-10-12 DIAGNOSIS — F410 Panic disorder [episodic paroxysmal anxiety] without agoraphobia: Secondary | ICD-10-CM | POA: Diagnosis not present

## 2021-10-12 DIAGNOSIS — F411 Generalized anxiety disorder: Secondary | ICD-10-CM | POA: Diagnosis not present

## 2021-10-12 DIAGNOSIS — R69 Illness, unspecified: Secondary | ICD-10-CM | POA: Diagnosis not present

## 2021-10-12 DIAGNOSIS — G4700 Insomnia, unspecified: Secondary | ICD-10-CM

## 2021-10-12 DIAGNOSIS — F331 Major depressive disorder, recurrent, moderate: Secondary | ICD-10-CM

## 2021-10-12 MED ORDER — VENLAFAXINE HCL ER 75 MG PO CP24: ORAL_CAPSULE | ORAL | 1 refills | Status: DC

## 2021-10-12 MED ORDER — QUETIAPINE FUMARATE 400 MG PO TABS: 800.0000 mg | ORAL_TABLET | Freq: Every day | ORAL | 1 refills | Status: DC

## 2021-10-12 MED ORDER — ALPRAZOLAM 1 MG PO TABS: ORAL_TABLET | ORAL | 2 refills | Status: DC

## 2021-10-12 NOTE — Progress Notes (Signed)
Leslie Benson [id number] 28-Nov-1960 61 y.o.  Subjective:   Patient ID:  Leslie Benson is a 61 y.o. (DOB [birth year]) female.  Chief Complaint: No chief complaint on file.   HPI Leslie Benson presents to the office today for follow-up of GAD, MDD, panic attacks and insomnia.  Describes mood today as "not the best". Pleasant. Tearful at times. Mood symptoms - reports depression, anxiety, and irritability. Decreased panic attacks. Stating "I'm doing ok right now". Feels like medications are helpful. Ongoing pain issues - fibromyalgia - back pain. Upcoming beach trip with sisters. She and husband also going to the beach for 2 weeks.Has not seen daughter and grand kids in over 5 years. Varying interest and motivation. Taking medications as prescribed.  Energy levels lower. Active, does not have a regular exercise routine.   Enjoys some usual interests and activities. Married. Lives with husband. Has 3 grown daughters - 2 grandchildren. Spending time with family. Talking to sisters. Appetite adequate. Weight 195 pounds. Sleeps well most nights. Averages 7 to 8 hours with Seroquel.   Focus and concentration difficulties. Completing tasks. Managing aspects of household. Receives SSI disability benefits for Fibromyalgia - 20 years.  Denies SI or HI.  Denies AH or VH.  Previous medication trials: Unknown   PHQ2-9    Flowsheet Row Video Visit from 07/27/2021 in [facility] at [place] Most recent reading at 07/27/2021  3:13 PM Clinical Support from 11/30/2020 in [facility] at [place] Most recent reading at 11/30/2020  3:24 PM Office Visit from 11/30/2020 in [facility] at [place] Most recent reading at 11/30/2020 12:58 PM Clinical Support from 10/28/2019 in [facility] at [place] Most recent reading at 10/28/2019  2:08 PM Office Visit from 01/23/2018 in [facility] Most recent reading at 01/23/2018  9:10 AM  PHQ-2 Total Score '6 1 1 4 6   '$ PHQ-9 Total Score 9 -- -- 15 19      Flowsheet Row ED to Hosp-Admission (Discharged) from 12/01/2020 in [facility] [place] [facility]  C-SSRS RISK CATEGORY No Risk        Review of Systems:  Review of Systems  Musculoskeletal:  Negative for gait problem.  Neurological:  Negative for tremors.  Psychiatric/Behavioral:         Please refer to HPI    Medications: I have reviewed the patient's current medications.  Current Outpatient Medications  Medication Sig Dispense Refill   ALPRAZolam (XANAX) 1 MG tablet Take 2 tablets by mouth three times a day as needed for anxiety 180 tablet 0   atorvastatin (LIPITOR) 40 MG tablet TAKE 1 TABLET BY MOUTH DAILY 90 tablet 3   levothyroxine (SYNTHROID) 25 MCG tablet TAKE 1 TABLET (25 MCG TOTAL) BY MOUTH DAILY BEFORE BREAKFAST. 90 tablet 3   meclizine (ANTIVERT) 25 MG tablet TAKE 1 TABLET BY MOUTH THREE TIMES DAILY AS NEEDED FOR DIZZINESS 30 tablet 0   Multiple Vitamin (MULTIVITAMIN ADULT PO) Take 1 tablet by mouth daily.     omega-3 acid ethyl esters (LOVAZA) 1 G capsule Take 1 g by mouth 2 (two) times daily.     omeprazole (PRILOSEC) 20 MG capsule Take 1 capsule (20 mg total) by mouth daily. 90 capsule 0   OVER THE COUNTER MEDICATION Take 1 tablet by mouth daily. Fiber Well     OZEMPIC, 0.25 OR 0.5 MG/DOSE, 2 MG/1.5ML SOPN SMARTSIG:0.25 Milligram(s) SUB-Q Once a Week     pregabalin (LYRICA) 75 MG capsule Take 1 capsule (75 mg total) by  mouth 2 (two) times daily. 60 capsule 2   QUEtiapine (SEROQUEL) 400 MG tablet Take 2 tablets (800 mg total) by mouth at bedtime. 180 tablet 1   venlafaxine XR (EFFEXOR-XR) 75 MG 24 hr capsule TAKE 3 CAPSULES EVERY DAY 270 capsule 1   No current facility-administered medications for this visit.    Medication Side Effects: None  Allergies:  Allergies  Allergen Reactions   Codeine Nausea And Vomiting   Compazine [Prochlorperazine Edisylate]     "feels weird"   Sulfa Antibiotics     Unknown reaction.     Past Medical History:  Diagnosis Date   ADHD    Allergy    Anemia    past hx    Anxiety    Barrett esophagus    Breast nodule    benign   Cervical arthritis 09/22/2013   Depression    Elevated liver enzymes    Fatty liver    Fibromyalgia    Fibromyalgia    GERD (gastroesophageal reflux disease)    H/O hiatal hernia    Headache(784.0)    migraines   High cholesterol    History of UTI    Lymphocytic colitis    Migraines    Pain    Panic attack    Paraesophageal hiatal hernia    repaired 2009   Spinal stenosis    Thyroid disease    Thyroid disease     Past Medical History, Surgical history, Social history, and Family history were reviewed and updated as appropriate.   Please see review of systems for further details on the patient's review from today.   Objective:   Physical Exam:  There were no vitals taken for this visit.  Physical Exam Constitutional:      General: She is not in acute distress. Musculoskeletal:        General: No deformity.  Neurological:     Mental Status: She is alert and oriented to person, place, and time.     Coordination: Coordination normal.  Psychiatric:        Attention and Perception: Attention and perception normal. She does not perceive auditory or visual hallucinations.        Mood and Affect: Mood normal. Mood is not anxious or depressed. Affect is not labile, blunt, angry or inappropriate.        Speech: Speech normal.        Behavior: Behavior normal.        Thought Content: Thought content normal. Thought content is not paranoid or delusional. Thought content does not include homicidal or suicidal ideation. Thought content does not include homicidal or suicidal plan.        Cognition and Memory: Cognition and memory normal.        Judgment: Judgment normal.     Comments: Insight intact     Lab Review:     Component Value Date/Time   NA 138 06/22/2021 1439   NA 141 10/01/2019 1323   K 4.4 06/22/2021 1439   CL 100  06/22/2021 1439   CO2 30 06/22/2021 1439   GLUCOSE 96 06/22/2021 1439   BUN 11 06/22/2021 1439   BUN 10 10/01/2019 1323   CREATININE 0.98 06/22/2021 1439   CALCIUM 9.8 06/22/2021 1439   PROT 6.3 (L) 12/03/2020 0443   PROT 7.2 09/25/2018 1125   ALBUMIN 3.9 12/03/2020 0443   ALBUMIN 4.8 09/25/2018 1125   AST 21 12/03/2020 0443   ALT 33 12/03/2020 0443   ALKPHOS 94 12/03/2020 0443  BILITOT 0.4 12/03/2020 0443   BILITOT 0.3 09/25/2018 1125   GFRNONAA >60 12/03/2020 0443   GFRAA 67 10/01/2019 1323       Component Value Date/Time   WBC 5.3 12/03/2020 0443   RBC 3.91 12/03/2020 0443   HGB 12.1 12/03/2020 0443   HCT 37.9 12/03/2020 0443   PLT 215 12/03/2020 0443   MCV 96.9 12/03/2020 0443   MCH 30.9 12/03/2020 0443   MCHC 31.9 12/03/2020 0443   RDW 13.2 12/03/2020 0443   LYMPHSABS 3.8 12/01/2020 2215   MONOABS 0.8 12/01/2020 2215   EOSABS 0.1 12/01/2020 2215   BASOSABS 0.1 12/01/2020 2215    No results found for: "POCLITH", "LITHIUM"   No results found for: "PHENYTOIN", "PHENOBARB", "VALPROATE", "CBMZ"   .res Assessment: Plan:    Plan:  PDMP reviewed  1. Xanax '1mg'$  - 6 times daily 2. Seroquel '400mg'$  - 2 at hs  3. Effexor XR '75mg'$  TID  RTC 6 months - will call in 3 months for next Xanax prescription.  Patient advised to contact office with any questions, adverse effects, or acute worsening in signs and symptoms.  Discussed potential benefits, risk, and side effects of benzodiazepines to include potential risk of tolerance and dependence, as well as possible drowsiness.  Advised patient not to drive if experiencing drowsiness and to take lowest possible effective dose to minimize risk of dependence and tolerance.  Discussed potential metabolic side effects associated with atypical antipsychotics, as well as potential risk for movement side effects. Advised pt to contact office if movement side effects occur.   There are no diagnoses linked to this encounter.   Please  see After Visit Summary for patient specific instructions.  Future Appointments  Date Time Provider [place]  12/07/2021  3:15 PM [facility] ADVISOR 2 [facility]  12/21/2021  4:00 PM [facility] CCM PHARMACIST [facility]    No orders of the defined types were placed in this encounter.   -------------------------------

## 2021-10-27 ENCOUNTER — Telehealth: Payer: Self-pay

## 2021-10-27 NOTE — Telephone Encounter (Signed)
Prior Authorization submitted and approved for quantity limits on Venlafaxine ER 75 mg 3/daily #270/90 day with [facility] effective 03/01//2023-04/23/2022

## 2021-11-10 ENCOUNTER — Telehealth: Payer: Self-pay | Admitting: Adult Health

## 2021-11-10 NOTE — Telephone Encounter (Signed)
Pt request refill OZEMPIC, 0.25 OR 0.5 MG/DOSE, 2 MG/1.5ML [employer] [facility] [address], [place] Phone:  [phone]  Fax:  [phone]

## 2021-11-11 ENCOUNTER — Other Ambulatory Visit: Payer: Self-pay | Admitting: Adult Health

## 2021-11-11 DIAGNOSIS — E118 Type 2 diabetes mellitus with unspecified complications: Secondary | ICD-10-CM

## 2021-11-11 MED ORDER — OZEMPIC (0.25 OR 0.5 MG/DOSE) 2 MG/3ML ~~LOC~~ SOPN: 0.5000 mg | PEN_INJECTOR | SUBCUTANEOUS | 2 refills | Status: DC

## 2021-11-11 NOTE — Telephone Encounter (Signed)
Left a detailed message at the patient's cell number with the information below.   

## 2021-12-07 ENCOUNTER — Ambulatory Visit: Payer: Medicare HMO

## 2021-12-07 ENCOUNTER — Ambulatory Visit (INDEPENDENT_AMBULATORY_CARE_PROVIDER_SITE_OTHER): Payer: Medicare HMO

## 2021-12-07 VITALS — Ht 64.0 in | Wt 183.0 lb

## 2021-12-07 DIAGNOSIS — Z0000 Encounter for general adult medical examination without abnormal findings: Secondary | ICD-10-CM | POA: Diagnosis not present

## 2021-12-07 NOTE — Progress Notes (Signed)
Subjective:   Leslie Benson is a 61 y.o. female who presents for Medicare Annual (Subsequent) preventive examination.  Review of Systems    Virtual Visit via Telephone Note  I connected with  Leslie Benson on 12/07/21 at  3:00 PM EDT by telephone and verified that I am speaking with the correct person using two identifiers.  Location: Patient: Home Provider: Office Persons participating in the virtual visit: patient/Nurse Health Advisor   I discussed the limitations, risks, security and privacy concerns of performing an evaluation and management service by telephone and the availability of in person appointments. The patient expressed understanding and agreed to proceed.  Interactive audio and video telecommunications were attempted between this nurse and patient, however failed, due to patient having technical difficulties OR patient did not have access to video capability.  We continued and completed visit with audio only.  Some vital signs may be absent or patient reported.   Leslie Peaches, LPN  Cardiac Risk Factors include: advanced age (>75mn, >>67women);hypertension;obesity (BMI >30kg/m2)     Objective:    Today's Vitals   12/07/21 1505  Weight: 183 lb (83 kg)  Height: '5\' 4"'$  (1.626 m)   Body mass index is 31.41 kg/m.     12/07/2021    3:34 PM 12/01/2020   10:09 PM 11/30/2020    3:31 PM 01/20/2019    2:51 PM 09/22/2015   10:44 PM 08/22/2015    1:53 PM 08/19/2014    9:03 AM  Advanced Directives  Does Patient Have a Medical Advance Directive? No No No No No No No  Would patient like information on creating a medical advance directive? No - Patient declined No - Patient declined No - Patient declined No - Patient declined No - patient declined information  No - patient declined information    Current Medications (verified) Outpatient Encounter Medications as of 12/07/2021  Medication Sig   ALPRAZolam (XANAX) 1 MG tablet Take 2 tablets by mouth three times  a day as needed for anxiety   atorvastatin (LIPITOR) 40 MG tablet TAKE 1 TABLET BY MOUTH DAILY   levothyroxine (SYNTHROID) 25 MCG tablet TAKE 1 TABLET (25 MCG TOTAL) BY MOUTH DAILY BEFORE BREAKFAST.   meclizine (ANTIVERT) 25 MG tablet TAKE 1 TABLET BY MOUTH THREE TIMES DAILY AS NEEDED FOR DIZZINESS   Multiple Vitamin (MULTIVITAMIN ADULT PO) Take 1 tablet by mouth daily.   omega-3 acid ethyl esters (LOVAZA) 1 G capsule Take 1 g by mouth 2 (two) times daily.   omeprazole (PRILOSEC) 20 MG capsule Take 1 capsule (20 mg total) by mouth daily.   OVER THE COUNTER MEDICATION Take 1 tablet by mouth daily. Fiber Well   pregabalin (LYRICA) 75 MG capsule Take 1 capsule (75 mg total) by mouth 2 (two) times daily.   QUEtiapine (SEROQUEL) 400 MG tablet Take 2 tablets (800 mg total) by mouth at bedtime.   Semaglutide,0.25 or 0.'5MG'$ /DOS, (OZEMPIC, 0.25 OR 0.5 MG/DOSE,) 2 MG/3ML SOPN Inject 0.5 mg into the skin once a week.   venlafaxine XR (EFFEXOR-XR) 75 MG 24 hr capsule TAKE 3 CAPSULES EVERY DAY   No facility-administered encounter medications on file as of 12/07/2021.    Allergies (verified) Codeine, Compazine [prochlorperazine edisylate], and Sulfa antibiotics   History: Past Medical History:  Diagnosis Date   ADHD    Allergy    Anemia    past hx    Anxiety    Barrett esophagus    Breast nodule    benign  Cervical arthritis 09/22/2013   Depression    Elevated liver enzymes    Fatty liver    Fibromyalgia    Fibromyalgia    GERD (gastroesophageal reflux disease)    H/O hiatal hernia    Headache(784.0)    migraines   High cholesterol    History of UTI    Lymphocytic colitis    Migraines    Pain    Panic attack    Paraesophageal hiatal hernia    repaired 2009   Spinal stenosis    Thyroid disease    Thyroid disease    Past Surgical History:  Procedure Laterality Date   ANKLE SURGERY     CHOLECYSTECTOMY  1980s?   COLONOSCOPY  2010   Buccini   EUS N/A 11/19/2013   Procedure:  ESOPHAGEAL ENDOSCOPIC ULTRASOUND (EUS) RADIAL;  Surgeon: Arta Silence, MD;  Location: [facility];  Service: Endoscopy;  Laterality: N/A;   LAPAROSCOPIC NISSEN FUNDOPLICATION  71/09/2692   LAPAROSCOPIC PARAESOPHAGEAL HERNIA REPAIR  03/05/2008   Dr Johney Maine   TUBAL LIGATION  1990s?   UPPER GASTROINTESTINAL ENDOSCOPY  04/05/2018   Scarlette Shorts    VAGINAL HYSTERECTOMY  03/04/2001   Family History  Problem Relation Age of Onset   Alcohol abuse Other    Elevated Lipids Other    Heart disease Other    Anxiety disorder Mother    Obesity Mother    High Cholesterol Mother    Thyroid cancer Mother    Depression Mother    Breast cancer Mother    Bone cancer Mother    High Cholesterol Father    Heart disease Father    Alcoholism Father    Colon cancer Maternal Aunt    Colon polyps Sister    Stomach cancer Neg Hx    Esophageal cancer Neg Hx    Rectal cancer Neg Hx    Social History   Socioeconomic History   Marital status: Married    Spouse name: Holiday representative   Number of children: Not on file   Years of education: Not on file   Highest education level: Not on file  Occupational History   Occupation: disabled  Tobacco Use   Smoking status: Never   Smokeless tobacco: Never  Vaping Use   Vaping Use: Never used  Substance and Sexual Activity   Alcohol use: Not Currently    Comment: wine occassionally   Drug use: No   Sexual activity: Yes    Birth control/protection: None    Comment: n/a partial hysterectomy  Other Topics Concern   Not on file  Social History Narrative   Married    Lived in [place].  Moved to [place] in early 2000s   She is on disability for anxiety    Social Determinants of Health   Financial Resource Strain: Low Risk  (12/07/2021)   Overall Financial Resource Strain (CARDIA)    Difficulty of Paying Living Expenses: Not hard at all  Food Insecurity: No Food Insecurity (12/07/2021)   Hunger Vital Sign    Worried About Running Out of Food in the Last Year: Never  true    Ran Out of Food in the Last Year: Never true  Transportation Needs: No Transportation Needs (12/07/2021)   PRAPARE - Hydrologist (Medical): No    Lack of Transportation (Non-Medical): No  Physical Activity: Inactive (12/07/2021)   Exercise Vital Sign    Days of Exercise per Week: 0 days    Minutes of Exercise per Session:  0 min  Stress: Stress Concern Present (12/07/2021)   [place]    Feeling of Stress : Rather much  Social Connections: Moderately Isolated (12/07/2021)   Social Connection and Isolation Panel [NHANES]    Frequency of Communication with Friends and Family: More than three times a week    Frequency of Social Gatherings with Friends and Family: More than three times a week    Attends Religious Services: Never    Marine scientist or Organizations: No    Attends Music therapist: Never    Marital Status: Married    Tobacco Counseling Counseling given: Not Answered   Clinical Intake:  Pre-visit preparation completed: No  Pain : No/denies pain     BMI - recorded: 33.46 Nutritional Status: BMI > 30  Obese Nutritional Risks: None Diabetes: No  How often do you need to have someone help you when you read instructions, pamphlets, or other written materials from your doctor or pharmacy?: 1 - Never  Diabetic?  No  Interpreter Needed?: No  Information entered by :: Rolene Arbour LPN   Activities of Daily Living    12/07/2021    3:28 PM  In your present state of health, do you have any difficulty performing the following activities:  Hearing? 0  Vision? 0  Difficulty concentrating or making decisions? 0  Walking or climbing stairs? 0  Dressing or bathing? 0  Doing errands, shopping? 0  Preparing Food and eating ? N  Using the Toilet? N  In the past six months, have you accidently leaked urine? Y  Comment Patient stated on occasions.  Followed by PCP  Do you have problems with loss of bowel control? N  Managing your Medications? N  Managing your Finances? N  Housekeeping or managing your Housekeeping? N    Patient Care Team: [name], NP as PCP - General (Family Medicine) Mayra Neer, MD (Family Medicine) Ronald Lobo, MD as Consulting Physician (Gastroenterology) Barbaraann Rondo, MD as Consulting Physician (Gynecology) Ronald Lobo, MD as Consulting Physician (Gastroenterology) Barbaraann Rondo, MD as Consulting Physician (Gynecology) [name], [facility] as Pharmacist (Pharmacist)  Indicate any recent Medical Services you may have received from other than [facility] providers in the past year (date may be approximate).     Assessment:   This is a routine wellness examination for Rohini.  Hearing/Vision screen Hearing Screening - Comments:: No hearing difficulty Vision Screening - Comments:: Wears glasses. Followed by [facility] issues and exercise activities discussed: Exercise limited by: None identified   Goals Addressed               This Visit's Progress     Lose weight (pt-stated)         Depression Screen    12/07/2021    3:11 PM 07/27/2021    3:13 PM 11/30/2020    3:24 PM 11/30/2020   12:58 PM 10/28/2019    2:08 PM 01/23/2018    9:10 AM 08/13/2015    2:02 PM  PHQ 2/9 Scores  PHQ - 2 Score '3 6 1 1 4 6 '$ 0  PHQ- 9 Score '8 9   15 19   '$ Exception Documentation      Medical reason     Fall Risk    12/07/2021    3:32 PM 11/30/2020    3:32 PM 10/28/2019    1:30 PM 08/13/2015    2:02 PM  Fall Risk  Falls in the past year? '1 1 1 '$ Yes  Number falls in past yr: 0 '1 1 2 '$ or more  Injury with Fall? 0 1 0 No  Comment Scraped knee. No medical attention needed bruises    Risk for fall due to : No Fall Risks Impaired balance/gait;Impaired mobility;History of fall(s)    Follow up  Falls prevention discussed      FALL RISK PREVENTION PERTAINING TO THE HOME:  Any stairs in or  around the home? No  If so, are there any without handrails? No  Home free of loose throw rugs in walkways, pet beds, electrical cords, etc? Yes  Adequate lighting in your home to reduce risk of falls? Yes   ASSISTIVE DEVICES UTILIZED TO PREVENT FALLS:  Life alert? No  Use of a cane, walker or w/c? No  Grab bars in the bathroom? Yes  Shower chair or bench in shower? No  Elevated toilet seat or a handicapped toilet? No   TIMED UP AND GO:  Was the test performed? No . Audio Visit   Cognitive Function:        12/07/2021    3:34 PM  6CIT Screen  What Year? 0 points  What month? 0 points  What time? 0 points  Count back from 20 0 points  Months in reverse 0 points  Repeat phrase 0 points  Total Score 0 points    Immunizations Immunization History  Administered Date(s) Administered   Influenza,inj,Quad PF,6+ Mos 02/24/2017, 01/31/2018, 01/20/2019, 05/17/2020, 05/20/2021   Influenza-Unspecified 01/24/2018   Janssen (J&J) SARS-COV-2 Vaccination 07/26/2019   PFIZER Comirnaty(Gray Top)Covid-19 Tri-Sucrose Vaccine 05/20/2021   PFIZER(Purple Top)SARS-COV-2 Vaccination 08/23/2020   PNEUMOCOCCAL CONJUGATE-20 05/20/2021   Tdap 04/05/2017, 11/19/2021   Zoster Recombinat (Shingrix) 05/20/2021, 11/19/2021    TDAP status: Up to date  Flu Vaccine status: Up to date  Pneumococcal vaccine status: Up to date  Covid-19 vaccine status: Completed vaccines  Qualifies for Shingles Vaccine? Yes   Zostavax completed No   Shingrix Completed?: No.    Education has been provided regarding the importance of this vaccine. Patient has been advised to call insurance company to determine out of pocket expense if they have not yet received this vaccine. Advised may also receive vaccine at local pharmacy or Health Dept. Verbalized acceptance and understanding.  Screening Tests Health Maintenance  Topic Date Due   MAMMOGRAM  05/22/2020   INFLUENZA VACCINE  11/22/2021   COVID-19 Vaccine (4 -  Janssen risk series) 12/23/2021 (Originally 07/15/2021)   COLONOSCOPY (Pts 45-61yr Insurance coverage will need to be confirmed)  04/16/2026   TETANUS/TDAP  11/20/2031   Hepatitis C Screening  Completed   HIV Screening  Completed   Zoster Vaccines- Shingrix  Completed   HPV VACCINES  Aged Out   FOOT EXAM  Discontinued   HEMOGLOBIN A1C  Discontinued   OPHTHALMOLOGY EXAM  Discontinued   URINE MICROALBUMIN  Discontinued    Health Maintenance  Health Maintenance Due  Topic Date Due   MAMMOGRAM  05/22/2020   INFLUENZA VACCINE  11/22/2021    Colorectal cancer screening: Type of screening: Colonoscopy. Completed 04/17/19. Repeat every 7 years  Mammogram status: Ordered 12/07/21. Pt provided with contact info and advised to call to schedule appt.     Lung Cancer Screening: (Low Dose CT Chest recommended if Age 63-80years, 30 pack-year currently smoking OR have quit w/in 15years.) does not qualify.    Additional Screening:  Hepatitis C Screening: does qualify; Completed 07/07/07  Vision Screening:  Recommended annual ophthalmology exams for early detection of glaucoma and other disorders of the eye. Is the patient up to date with their annual eye exam?  Yes  Who is the provider or what is the name of the office in which the patient attends annual eye exams? [facility] If pt is not established with a provider, would they like to be referred to a provider to establish care? No .   Dental Screening: Recommended annual dental exams for proper oral hygiene  Community Resource Referral / Chronic Care Management:  CRR required this visit?  No   CCM required this visit?  No      Plan:     I have personally reviewed and noted the following in the patient's chart:   Medical and social history Use of alcohol, tobacco or illicit drugs  Current medications and supplements including opioid prescriptions.  Functional ability and status Nutritional status Physical  activity Advanced directives List of other physicians Hospitalizations, surgeries, and ER visits in previous 12 months Vitals Screenings to include cognitive, depression, and falls Referrals and appointments  In addition, I have reviewed and discussed with patient certain preventive protocols, quality metrics, and best practice recommendations. A written personalized care plan for preventive services as well as general preventive health recommendations were provided to patient.     Leslie Peaches, LPN   [id number]/[id number]   Nurse Notes: Patient currently in counseling, followed by a Psychiatrist Patient stated she has occasional thoughts of suicide without a plan of execution.

## 2021-12-07 NOTE — Patient Instructions (Addendum)
[name] , Thank you for taking time to come for your Medicare Wellness Visit. I appreciate your ongoing commitment to your health goals. Please review the following plan we discussed and let me know if I can assist you in the future.   These are the goals we discussed:  Goals       lose weight      Lose weight (pt-stated)      Patient Stated        This is a list of the screening recommended for you and due dates:  Health Maintenance  Topic Date Due   Mammogram  05/22/2020   Flu Shot  11/22/2021   COVID-19 Vaccine (4 - Janssen risk series) 12/23/2021*   Colon Cancer Screening  04/16/2026   Tetanus Vaccine  11/20/2031   Hepatitis C Screening: USPSTF Recommendation to screen - Ages 18-79 yo.  Completed   HIV Screening  Completed   Zoster (Shingles) Vaccine  Completed   HPV Vaccine  Aged Out   Complete foot exam   Discontinued   Hemoglobin A1C  Discontinued   Eye exam for diabetics  Discontinued   Urine Protein Check  Discontinued  *Topic was postponed. The date shown is not the original due date.   Advanced directives: No  Conditions/risks identified: None  Next appointment: Follow up in one year for your annual wellness visit     Preventive Care 65 Years and Older, Female Preventive care refers to lifestyle choices and visits with your health care provider that can promote health and wellness. What does preventive care include? A yearly physical exam. This is also called an annual well check. Dental exams once or twice a year. Routine eye exams. Ask your health care provider how often you should have your eyes checked. Personal lifestyle choices, including: Daily care of your teeth and gums. Regular physical activity. Eating a healthy diet. Avoiding tobacco and drug use. Limiting alcohol use. Practicing safe sex. Taking low-dose aspirin every day. Taking vitamin and mineral supplements as recommended by your health care provider. What happens during an  annual well check? The services and screenings done by your health care provider during your annual well check will depend on your age, overall health, lifestyle risk factors, and family history of disease. Counseling  Your health care provider may ask you questions about your: Alcohol use. Tobacco use. Drug use. Emotional well-being. Home and relationship well-being. Sexual activity. Eating habits. History of falls. Memory and ability to understand (cognition). Work and work Statistician. Reproductive health. Screening  You may have the following tests or measurements: Height, weight, and BMI. Blood pressure. Lipid and cholesterol levels. These may be checked every 5 years, or more frequently if you are over 53 years old. Skin check. Lung cancer screening. You may have this screening every year starting at age 57 if you have a 30-pack-year history of smoking and currently smoke or have quit within the past 15 years. Fecal occult blood test (FOBT) of the stool. You may have this test every year starting at age 20. Flexible sigmoidoscopy or colonoscopy. You may have a sigmoidoscopy every 5 years or a colonoscopy every 10 years starting at age 37. Hepatitis C blood test. Hepatitis B blood test. Sexually transmitted disease (STD) testing. Diabetes screening. This is done by checking your blood sugar (glucose) after you have not eaten for a while (fasting). You may have this done every 1-3 years. Bone density scan. This is done to screen for osteoporosis. You may have  this done starting at age 49. Mammogram. This may be done every 1-2 years. Talk to your health care provider about how often you should have regular mammograms. Talk with your health care provider about your test results, treatment options, and if necessary, the need for more tests. Vaccines  Your health care provider may recommend certain vaccines, such as: Influenza vaccine. This is recommended every year. Tetanus,  diphtheria, and acellular pertussis (Tdap, Td) vaccine. You may need a Td booster every 10 years. Zoster vaccine. You may need this after age 34. Pneumococcal 13-valent conjugate (PCV13) vaccine. One dose is recommended after age 76. Pneumococcal polysaccharide (PPSV23) vaccine. One dose is recommended after age 5. Talk to your health care provider about which screenings and vaccines you need and how often you need them. This information is not intended to replace advice given to you by your health care provider. Make sure you discuss any questions you have with your health care provider. Document Released: 05/07/2015 Document Revised: 12/29/2015 Document Reviewed: 02/09/2015 Elsevier Interactive Patient Education  2017 [place] Prevention in the Home Falls can cause injuries. They can happen to people of all ages. There are many things you can do to make your home safe and to help prevent falls. What can I do on the outside of my home? Regularly fix the edges of walkways and driveways and fix any cracks. Remove anything that might make you trip as you walk through a door, such as a raised step or threshold. Trim any bushes or trees on the path to your home. Use bright outdoor lighting. Clear any walking paths of anything that might make someone trip, such as rocks or tools. Regularly check to see if handrails are loose or broken. Make sure that both sides of any steps have handrails. Any raised decks and porches should have guardrails on the edges. Have any leaves, snow, or ice cleared regularly. Use sand or salt on walking paths during winter. Clean up any spills in your garage right away. This includes oil or grease spills. What can I do in the bathroom? Use night lights. Install grab bars by the toilet and in the tub and shower. Do not use towel bars as grab bars. Use non-skid mats or decals in the tub or shower. If you need to sit down in the shower, use a plastic,  non-slip stool. Keep the floor dry. Clean up any water that spills on the floor as soon as it happens. Remove soap buildup in the tub or shower regularly. Attach bath mats securely with double-sided non-slip rug tape. Do not have throw rugs and other things on the floor that can make you trip. What can I do in the bedroom? Use night lights. Make sure that you have a light by your bed that is easy to reach. Do not use any sheets or blankets that are too big for your bed. They should not hang down onto the floor. Have a firm chair that has side arms. You can use this for support while you get dressed. Do not have throw rugs and other things on the floor that can make you trip. What can I do in the kitchen? Clean up any spills right away. Avoid walking on wet floors. Keep items that you use a lot in easy-to-reach places. If you need to reach something above you, use a strong step stool that has a grab bar. Keep electrical cords out of the way. Do not use floor polish or  wax that makes floors slippery. If you must use wax, use non-skid floor wax. Do not have throw rugs and other things on the floor that can make you trip. What can I do with my stairs? Do not leave any items on the stairs. Make sure that there are handrails on both sides of the stairs and use them. Fix handrails that are broken or loose. Make sure that handrails are as long as the stairways. Check any carpeting to make sure that it is firmly attached to the stairs. Fix any carpet that is loose or worn. Avoid having throw rugs at the top or bottom of the stairs. If you do have throw rugs, attach them to the floor with carpet tape. Make sure that you have a light switch at the top of the stairs and the bottom of the stairs. If you do not have them, ask someone to add them for you. What else can I do to help prevent falls? Wear shoes that: Do not have high heels. Have rubber bottoms. Are comfortable and fit you well. Are closed  at the toe. Do not wear sandals. If you use a stepladder: Make sure that it is fully opened. Do not climb a closed stepladder. Make sure that both sides of the stepladder are locked into place. Ask someone to hold it for you, if possible. Clearly mark and make sure that you can see: Any grab bars or handrails. First and last steps. Where the edge of each step is. Use tools that help you move around (mobility aids) if they are needed. These include: Canes. Walkers. Scooters. Crutches. Turn on the lights when you go into a dark area. Replace any light bulbs as soon as they burn out. Set up your furniture so you have a clear path. Avoid moving your furniture around. If any of your floors are uneven, fix them. If there are any pets around you, be aware of where they are. Review your medicines with your doctor. Some medicines can make you feel dizzy. This can increase your chance of falling. Ask your doctor what other things that you can do to help prevent falls. This information is not intended to replace advice given to you by your health care provider. Make sure you discuss any questions you have with your health care provider. Document Released: 02/04/2009 Document Revised: 09/16/2015 Document Reviewed: 05/15/2014 Elsevier Interactive Patient Education  2017 [employer].

## 2021-12-20 ENCOUNTER — Other Ambulatory Visit: Payer: Self-pay | Admitting: Internal Medicine

## 2021-12-20 ENCOUNTER — Telehealth: Payer: Self-pay | Admitting: Pharmacist

## 2021-12-20 DIAGNOSIS — K222 Esophageal obstruction: Secondary | ICD-10-CM

## 2021-12-20 DIAGNOSIS — R1310 Dysphagia, unspecified: Secondary | ICD-10-CM

## 2021-12-20 DIAGNOSIS — K219 Gastro-esophageal reflux disease without esophagitis: Secondary | ICD-10-CM

## 2021-12-20 NOTE — Progress Notes (Unsigned)
Chronic Care Management Pharmacy Note  12/22/2021 Name:  Leslie Benson MRN:  [id number] DOB:  1960-12-29  Summary: Pt is still feeling stressed Pt is having neck and back pain  Recommendations/Changes made from today's visit: -Recommended discussing with psychiatrist about switching to long acting benzo to avoid rebound anxiety -Consider addition of buspirone for anxiety -Recommended connecting with counselor/therapist -Recommend trial of diclofenac/Voltaren gel  Plan: Anxiety/sleep assessment in 3 months Follow up in 6 months  Subjective: Leslie Benson is an 61 y.o. year old female who is a primary patient of [name], NP.  The CCM team was consulted for assistance with disease management and care coordination needs.    Engaged with patient by telephone for follow up visit in response to provider referral for pharmacy case management and/or care coordination services.   Consent to Services:  The patient was given information about Chronic Care Management services, agreed to services, and gave verbal consent prior to initiation of services.  Please see initial visit note for detailed documentation.   Patient Care Team: [name], NP as PCP - General (Family Medicine) Mayra Neer, MD (Family Medicine) Ronald Lobo, MD as Consulting Physician (Gastroenterology) Barbaraann Rondo, MD as Consulting Physician (Gynecology) Ronald Lobo, MD as Consulting Physician (Gastroenterology) Barbaraann Rondo, MD as Consulting Physician (Gynecology) [name], [facility] as Pharmacist (Pharmacist)  Recent office visits: 12/07/21 Rolene Arbour, LPN: Patient presented for AWV.  09/20/21 [name], NP - Patient presented for Tendonitis and other concerns. Stopped Gabapentin. Recommended only nighttime dose of Lyrica.  07/27/21 [name], NP: Patient presented for video visit for fibromyalgia. Tapered gabapentin and started Cymbalta 30 mg with plans to  increased to 60 mg in the next few weeks.  06/22/21 [name], NP: Patient presented for low back pain and depression related to obesity and weight gain. Prescribed Wegovy for weight loss.  Recent consult visits: 10/12/21 [name], [name] NP [facility]) - Patient presented via Tele-health for panic attacks.  06/28/21 Candee Furbish, MD (cardiology): Patient presented for hyperlipidemia follow up. No medication changes.  06/16/21 [name], [name] NP [facility]) - Patient presented via Tele-health for panic attacks.  Hospital visits: Medication Reconciliation was completed by comparing discharge summary, patient's EMR and Pharmacy list, and upon discussion with patient.   [facility] on 12/01/20 due to Orthostatic hypotension. Patient was present for 3 days.   New?Medications Started at [facility] Discharge:?? -started  meclizine (ANTIVERT   Medication Changes at Hospital Discharge: -Changed  how you take: gabapentin (Neurontin   Medications Discontinued at Hospital Discharge: -Stopped Pfizer-BioNT COVID-19 Vac-TriS Susp injection (COVID-19 mRNA Vac-TriS ([place]))   Medications that remain the same after Hospital Discharge:??  -All other medications will remain the same.       Objective:  Lab Results  Component Value Date   CREATININE 0.98 06/22/2021   BUN 11 06/22/2021   GFR 61.90 06/22/2021   GFRNONAA >60 12/03/2020   GFRAA 67 10/01/2019   NA 138 06/22/2021   K 4.4 06/22/2021   CALCIUM 9.8 06/22/2021   CO2 30 06/22/2021   GLUCOSE 96 06/22/2021    Lab Results  Component Value Date/Time   HGBA1C 5.4 06/22/2021 02:39 PM   HGBA1C 5.6 11/30/2020 01:34 PM   GFR 61.90 06/22/2021 02:39 PM   GFR 67.93 11/30/2020 01:34 PM    Last diabetic Eye exam:  Lab Results  Component Value Date/Time   HMDIABEYEEXA No Retinopathy 06/29/2021 12:00 AM    Last diabetic  Foot exam: No results found for: "HMDIABFOOTEX"   Lab Results   Component Value Date   CHOL 187 11/30/2020   HDL 80.00 11/30/2020   LDLCALC 89 11/30/2020   LDLDIRECT 168.0 04/05/2017   TRIG 91.0 11/30/2020   CHOLHDL 2 11/30/2020       Latest Ref Rng & Units 12/03/2020    4:43 AM 12/01/2020   10:15 PM 11/30/2020    1:34 PM  Hepatic Function  Total Protein 6.5 - 8.1 g/dL 6.3  6.9  7.0   Albumin 3.5 - 5.0 g/dL 3.9  4.3  4.5   AST 15 - 41 U/L 21  43  24   ALT 0 - 44 U/L 33  44  35   Alk Phosphatase 38 - 126 U/L 94  105  121   Total Bilirubin 0.3 - 1.2 mg/dL 0.4  0.4  0.3     Lab Results  Component Value Date/Time   TSH 1.564 12/02/2020 12:18 PM   TSH 1.48 11/30/2020 01:34 PM   TSH 1.44 10/28/2019 01:59 PM   FREET4 0.83 01/23/2018 11:42 AM   FREET4 0.81 09/21/2013 10:15 PM       Latest Ref Rng & Units 12/03/2020    4:43 AM 12/02/2020   12:18 PM 12/01/2020   10:26 PM  CBC  WBC 4.0 - 10.5 K/uL 5.3  5.0    Hemoglobin 12.0 - 15.0 g/dL 12.1  13.8  12.6   Hematocrit 36.0 - 46.0 % 37.9  43.0  37.0   Platelets 150 - 400 K/uL 215  216      Lab Results  Component Value Date/Time   VD25OH 45.4 05/29/2018 01:10 PM   VD25OH 41.24 10/30/2017 03:14 PM    Clinical ASCVD: No  The ASCVD Risk score (Arnett DK, et al., 2019) failed to calculate for the following reasons:   The systolic blood pressure is missing       12/07/2021    3:11 PM 07/27/2021    3:13 PM 11/30/2020    3:24 PM  Depression screen PHQ 2/9  Decreased Interest 0 3 0  Down, Depressed, Hopeless 3 3 1   PHQ - 2 Score 3 6 1   Altered sleeping 1 0   Tired, decreased energy 1 2   Change in appetite 0 0   Feeling bad or failure about yourself  2 0   Trouble concentrating 0 1   Moving slowly or fidgety/restless 0 0   Suicidal thoughts 1 0   PHQ-9 Score 8 9   Difficult doing work/chores Somewhat difficult Not difficult at all       Social History   Tobacco Use  Smoking Status Never  Smokeless Tobacco Never   BP Readings from Last 3 Encounters:  09/20/21 110/60  06/28/21  122/64  06/22/21 120/80   Pulse Readings from Last 3 Encounters:  09/20/21 92  06/28/21 84  06/22/21 96   Wt Readings from Last 3 Encounters:  12/07/21 183 lb (83 kg)  09/20/21 195 lb (88.5 kg)  07/27/21 204 lb (92.5 kg)   BMI Readings from Last 3 Encounters:  12/07/21 31.41 kg/m  09/20/21 33.47 kg/m  07/27/21 35.02 kg/m    Assessment/Interventions: Review of patient past medical history, allergies, medications, health status, including review of consultants reports, laboratory and other test data, was performed as part of comprehensive evaluation and provision of chronic care management services.   SDOH:  (Social Determinants of Health) assessments and interventions performed: No SDOH Interventions    Flowsheet  Row Most Recent Value  SDOH Interventions   Financial Strain Interventions Intervention Not Indicated  Transportation Interventions Intervention Not Indicated       SDOH Screenings   Alcohol Screen: Low Risk  (12/07/2021)   Alcohol Screen    Last Alcohol Screening Score (AUDIT): 0  Depression (PHQ2-9): Medium Risk (12/07/2021)   Depression (PHQ2-9)    PHQ-2 Score: 8  Financial Resource Strain: Low Risk  (12/21/2021)   Overall Financial Resource Strain (CARDIA)    Difficulty of Paying Living Expenses: Not hard at all  Food Insecurity: No Food Insecurity (12/07/2021)   Hunger Vital Sign    Worried About Running Out of Food in the Last Year: Never true    Ran Out of Food in the Last Year: Never true  Housing: Low Risk  (12/07/2021)   Housing    Last Housing Risk Score: 0  Physical Activity: Inactive (12/07/2021)   Exercise Vital Sign    Days of Exercise per Week: 0 days    Minutes of Exercise per Session: 0 min  Social Connections: Moderately Isolated (12/07/2021)   Social Connection and Isolation Panel [NHANES]    Frequency of Communication with Friends and Family: More than three times a week    Frequency of Social Gatherings with Friends and Family: More  than three times a week    Attends Religious Services: Never    Marine scientist or Organizations: No    Attends Archivist Meetings: Never    Marital Status: Married  Stress: Stress Concern Present (12/07/2021)   [employer] [place]    Feeling of Stress : Rather much  Tobacco Use: Low Risk  (12/07/2021)   Patient History    Smoking Tobacco Use: Never    Smokeless Tobacco Use: Never    Passive Exposure: Not on file  Transportation Needs: No Transportation Needs (12/21/2021)   PRAPARE - Transportation    Lack of Transportation (Medical): No    Lack of Transportation (Non-Medical): No    CCM Care Plan  Allergies  Allergen Reactions   Codeine Nausea And Vomiting   Compazine [Prochlorperazine Edisylate]     "feels weird"   Sulfa Antibiotics     Unknown reaction.    Medications Reviewed Today     Reviewed by [name], [facility] (Pharmacist) on 12/21/21 at 1700  Med List Status: <None>   Medication Order Taking? Sig Documenting Provider Last Dose Status Informant  ALPRAZolam (XANAX) 1 MG tablet [id number] Yes Take 2 tablets by mouth three times a day as needed for anxiety [name], [name], NP Taking Active   atorvastatin (LIPITOR) 40 MG tablet [id number] Yes TAKE 1 TABLET BY MOUTH DAILY Jerline Pain, MD Taking Active   levothyroxine (SYNTHROID) 25 MCG tablet [id number] Yes TAKE 1 TABLET (25 MCG TOTAL) BY MOUTH DAILY BEFORE BREAKFAST. Nafziger, Tommi Rumps, NP Taking Active   meclizine (ANTIVERT) 25 MG tablet [id number]  TAKE 1 TABLET BY MOUTH THREE TIMES DAILY AS NEEDED FOR DIZZINESS Nafziger, Tommi Rumps, NP  Active   Multiple Vitamin (MULTIVITAMIN ADULT PO) [id number]  Take 1 tablet by mouth daily. [provider]  Active   omega-3 acid ethyl esters (LOVAZA) 1 G capsule [id number] Yes Take 1 g by mouth 2 (two) times daily. [provider] Taking Active Self  omeprazole (PRILOSEC) 20 MG capsule  [id number] Yes Take 1 capsule by mouth once daily Irene Shipper, MD Taking Active   OVER THE COUNTER MEDICATION [id number]  Take  1 tablet by mouth daily. Fiber Well [provider]  Active Self  pregabalin (LYRICA) 75 MG capsule [id number] Yes Take 1 capsule (75 mg total) by mouth 2 (two) times daily. Nafziger, Tommi Rumps, NP Taking Active   QUEtiapine (SEROQUEL) 400 MG tablet [id number] Yes Take 2 tablets (800 mg total) by mouth at bedtime. [name], [name], NP Taking Active   Semaglutide,0.25 or 0.5MG/DOS, (OZEMPIC, 0.25 OR 0.5 MG/DOSE,) 2 MG/1.5ML SOPN [id number] Yes Inject 0.5 mg into the skin once a week. [provider] Taking Active   venlafaxine XR (EFFEXOR-XR) 75 MG 24 hr capsule [id number] Yes TAKE 3 CAPSULES EVERY DAY [name], [name], NP Taking Active             Patient Active Problem List   Diagnosis Date Noted   Coronary artery calcification 06/28/2021   Orthostatic hypotension 12/04/2020   Vertigo 12/02/2020   Dizziness of unknown cause 12/02/2020   GERD (gastroesophageal reflux disease)    Anxiety    Hyperlipidemia associated with type 2 diabetes mellitus ([place]) 07/03/2018   Other hyperlipidemia 01/23/2018   Class 1 obesity with serious comorbidity and body mass index (BMI) of 30.0 to 30.9 in adult 01/23/2018   Hypothyroidism 11/25/2014   Adynamic ileus ([place]) 09/22/2013   Obesity (BMI 30-39.9) 09/22/2013   Abdominal pain, epigastric 09/22/2013   Transaminitis 09/22/2013   Cervical arthritis 09/22/2013   Chronic constipation 09/22/2013   Fibromyalgia     Immunization History  Administered Date(s) Administered   Influenza,inj,Quad PF,6+ Mos 02/24/2017, 01/31/2018, 01/20/2019, 05/17/2020, 05/20/2021   Influenza-Unspecified 01/24/2018   Janssen (J&J) SARS-COV-2 Vaccination 07/26/2019   PFIZER Comirnaty(Gray Top)Covid-19 Tri-Sucrose Vaccine 05/20/2021   PFIZER(Purple Top)SARS-COV-2 Vaccination 08/23/2020   PNEUMOCOCCAL CONJUGATE-20 05/20/2021    Tdap 04/05/2017, 11/19/2021   Zoster Recombinat (Shingrix) 05/20/2021, 11/19/2021   Patient is having trouble with her neck and has a doctor's appointment with ortho next week. She reports her back problems are worse and she can hardly lift her head. She takes 3 of the Tylenol at once (1500 mg). Recommended Voltaren gel (diclofenac gel) for lower back and possibly neck pain if related to arthritis.  Patient feels like the GERD is getting worse. She reports food also gets stuck in her throat. This ruins her appetite. Patient will schedule a visit with Dr. Henrene Pastor.  Patient is taking at night when she goes to bed. Patient reports she is feeling jittery with taking the Lyrica. Not since she is taking it at night.   Conditions to be addressed/monitored:  Hyperlipidemia, Diabetes, GERD, Hypothyroidism, Depression, Anxiety, and Fibromyalgia  Conditions addressed this visit: Weight loss, depression, anxiety, fibromyalgia   Care Plan : [place]  Updates made by [name], [place] since 12/22/2021 12:00 AM     Problem: Problem: Hyperlipidemia, Diabetes, GERD, Hypothyroidism, Depression, Anxiety, and Fibromyalgia      Long-Range Goal: Patient-Specific Goal   Start Date: 04/20/2021  Expected End Date: 04/20/2022  Recent Progress: On track  Priority: High  Note:   Current Barriers:  Unable to independently monitor therapeutic efficacy  Pharmacist Clinical Goal(s):  Patient will achieve adherence to monitoring guidelines and medication adherence to achieve therapeutic efficacy through collaboration with PharmD and provider.   Interventions: 1:1 collaboration with [name], NP regarding development and update of comprehensive plan of care as evidenced by provider attestation and co-signature Inter-disciplinary care team collaboration (see longitudinal plan of care) Comprehensive medication review performed; medication list updated in electronic medical  record  Hyperlipidemia: (LDL goal <  100) -Controlled -Current treatment: Atorvastatin 40 mg 1 tablet daily - Appropriate, Effective, Safe, Accessible Lovaza 1 g 1 capsule twice daily - Appropriate, Effective, Safe, Accessible -Medications previously tried: none  -Current dietary patterns: did not discuss -Current exercise habits: not exercising right now -Educated on Cholesterol goals;  Benefits of statin for ASCVD risk reduction; Exercise goal of 150 minutes per week; -Counseled on diet and exercise extensively Recommended to continue current medication  Pre-diabetes/weight loss (A1c goal <6.5%) -Controlled -Current medications: Ozempic 0.5 mg inject once weekly - Appropriate, Query effective, Safe, Accessible -Medications previously tried: metformin  -Current home glucose readings fasting glucose: does not need to check post prandial glucose: does not need to check -Denies hypoglycemic/hyperglycemic symptoms -Current meal patterns:  breakfast: n/a  lunch: n/a  dinner: n/a snacks: n/a drinks: n/a -Current exercise: does not exercise -Educated on A1c and blood sugar goals; -Counseled to check feet daily and get yearly eye exams -Counseled on diet and exercise extensively Recommended to continue current medication  Depression/Anxiety (Goal: minimize symptoms) -Not ideally controlled -Current treatment: Alprazolam 1 mg 2 tablets three times daily as needed (taking 2 every 6 hours) - Appropriate, Effective, Query Safe, Accessible Quetiapine 400 mg 2 tablets at bedtime - Appropriate, Query effective, Safe, Accessible Venlafaxine XR 75 mg 3 capsules daily (taking 3 times a day) - Appropriate, Effective, Query Safe, Accessible  -Medications previously tried/failed: unknown -PHQ9: 1 -GAD7: n/a -Educated on Benefits of medication for symptom control Benefits of cognitive-behavioral therapy with or without medication -Recommended to continue current medication Counseled on  rebound anxiety with alprazolam and recommended discussing with psychiatrist about switching to longer acting benzo. Recommended taking 2 of the venlafaxine in the morning and 1 in the afternoon to lessen possible risk of insomnia.   GERD (Goal: minimize symptoms) -Controlled -Current treatment  Omeprazole 20 mg 1 capsule daily as needed - Appropriate, Effective, Safe, Accessible -Medications previously tried: none  -Counseled on taking it every night prior to when heartburn is bothering her.  Fibromyalgia (Goal: minimize pain) -Controlled -Current treatment  Lyrica 75 mg 1 capsule daily (twice daily made her dizzy) - Appropriate, Effective, Query Safe, Accessible -Medications previously tried: gabapentin, duloxetine (duplicate with Effexor)  -Recommended to continue current medication  Hypothyroidism (Goal: 0.35-4.5) -Controlled -Current treatment  Levothyroxine 25 mcg 1 tablet daily - Appropriate, Effective, Safe, Accessible -Medications previously tried: none  -Counseled on importance of separating from other medications on an empty stomach.  Health Maintenance -Vaccine gaps: influenza, prevnar, shingrix, COVID booster -Current therapy:  Meclizine 25 mg as needed Fiberwell daily -Educated on Cost vs benefit of each product must be carefully weighed by individual consumer Supplements may interfere with prescription drugs -Patient is satisfied with current therapy and denies issues -Recommended to continue current medication  Patient Goals/Self-Care Activities Patient will:  - take medications as prescribed as evidenced by patient report and record review target a minimum of 150 minutes of moderate intensity exercise weekly  Follow Up Plan: Telephone follow up appointment with care management team member scheduled for: 6 months      Medication Assistance: None required.  Patient affirms current coverage meets needs.  Compliance/Adherence/Medication fill history: Care  Gaps: Influenza, mammogram, COVID booster A1c: 5.4% (06/22/21)  Star-Rating Drugs: Atorvastatin (Lipitor) 40 mg - Last filled 12/05/21 60 DS at [place] filled 12/11/21 28 DS at [facility]  Patient's preferred pharmacy is:  [place], [place]  [place] Phone: [phone] Fax: [phone]  [place], [place] [place] [address] Phone: [phone] Fax: [phone]  Uses pill box? Yes - fills it up once a week  Pt endorses 95% compliance - goes ahead   We discussed: Current pharmacy is preferred with insurance plan and patient is satisfied with pharmacy services Patient decided to: Continue current medication management strategy  Care Plan and Follow Up Patient Decision:  Patient agrees to Care Plan and Follow-up.  Plan: Telephone follow up appointment with care management team member scheduled for:  6 months  Jeni Salles, PharmD, [place] [facility] [place] [phone]

## 2021-12-20 NOTE — Chronic Care Management (AMB) (Signed)
    Chronic Care Management Pharmacy Assistant   Name: Leslie Benson  MRN: [id number] DOB: 1960-05-28  12/20/21 APPOINTMENT REMINDER  Patient was reminded to have all medications, supplements and any blood glucose and blood pressure readings available for review with Jeni Salles, Pharm. D, for telephone visit on 12/21/21 at 4.    Care Gaps: Mammogram - Overdue Flu Vaccine - Overdue COVID Booster - Postponed AWV- 8/23 Lab Results  Component Value Date   HGBA1C 5.4 06/22/2021    Star Rating Drug: Atorvastatin (Lipitor) 40 mg - Last filled 12/05/21 60 DS at [facility] Semaglutide 0.25/0.5 Last filled 12/11/21 28 DS at [facility]   Medications: Outpatient Encounter Medications as of 12/20/2021  Medication Sig   ALPRAZolam (XANAX) 1 MG tablet Take 2 tablets by mouth three times a day as needed for anxiety   atorvastatin (LIPITOR) 40 MG tablet TAKE 1 TABLET BY MOUTH DAILY   levothyroxine (SYNTHROID) 25 MCG tablet TAKE 1 TABLET (25 MCG TOTAL) BY MOUTH DAILY BEFORE BREAKFAST.   meclizine (ANTIVERT) 25 MG tablet TAKE 1 TABLET BY MOUTH THREE TIMES DAILY AS NEEDED FOR DIZZINESS   Multiple Vitamin (MULTIVITAMIN ADULT PO) Take 1 tablet by mouth daily.   omega-3 acid ethyl esters (LOVAZA) 1 G capsule Take 1 g by mouth 2 (two) times daily.   omeprazole (PRILOSEC) 20 MG capsule Take 1 capsule (20 mg total) by mouth daily.   OVER THE COUNTER MEDICATION Take 1 tablet by mouth daily. Fiber Well   pregabalin (LYRICA) 75 MG capsule Take 1 capsule (75 mg total) by mouth 2 (two) times daily.   QUEtiapine (SEROQUEL) 400 MG tablet Take 2 tablets (800 mg total) by mouth at bedtime.   venlafaxine XR (EFFEXOR-XR) 75 MG 24 hr capsule TAKE 3 CAPSULES EVERY DAY   No facility-administered encounter medications on file as of 12/20/2021.       [place] Clinical Pharmacist Assistant [phone]

## 2021-12-21 ENCOUNTER — Ambulatory Visit (INDEPENDENT_AMBULATORY_CARE_PROVIDER_SITE_OTHER): Payer: Medicare HMO | Admitting: Pharmacist

## 2021-12-21 ENCOUNTER — Ambulatory Visit: Payer: Medicare HMO | Admitting: Adult Health

## 2021-12-21 DIAGNOSIS — F419 Anxiety disorder, unspecified: Secondary | ICD-10-CM

## 2021-12-21 DIAGNOSIS — M797 Fibromyalgia: Secondary | ICD-10-CM

## 2021-12-22 DIAGNOSIS — F32A Depression, unspecified: Secondary | ICD-10-CM | POA: Diagnosis not present

## 2021-12-22 DIAGNOSIS — E785 Hyperlipidemia, unspecified: Secondary | ICD-10-CM

## 2021-12-22 DIAGNOSIS — R69 Illness, unspecified: Secondary | ICD-10-CM | POA: Diagnosis not present

## 2021-12-22 NOTE — Patient Instructions (Signed)
Visit Information   Goals Addressed   None    Patient Care Plan: CCM Pharmacy Care Plan     Problem Identified: Problem: Hyperlipidemia, Diabetes, GERD, Hypothyroidism, Depression, Anxiety, and Fibromyalgia      Long-Range Goal: Patient-Specific Goal   Start Date: 04/20/2021  Expected End Date: 04/20/2022  Recent Progress: On track  Priority: High  Note:   Current Barriers:  Unable to independently monitor therapeutic efficacy  Pharmacist Clinical Goal(s):  Patient will achieve adherence to monitoring guidelines and medication adherence to achieve therapeutic efficacy through collaboration with PharmD and provider.   Interventions: 1:1 collaboration with [name], NP regarding development and update of comprehensive plan of care as evidenced by provider attestation and co-signature Inter-disciplinary care team collaboration (see longitudinal plan of care) Comprehensive medication review performed; medication list updated in electronic medical record  Hyperlipidemia: (LDL goal < 100) -Controlled -Current treatment: Atorvastatin 40 mg 1 tablet daily - Appropriate, Effective, Safe, Accessible Lovaza 1 g 1 capsule twice daily - Appropriate, Effective, Safe, Accessible -Medications previously tried: none  -Current dietary patterns: did not discuss -Current exercise habits: not exercising right now -Educated on Cholesterol goals;  Benefits of statin for ASCVD risk reduction; Exercise goal of 150 minutes per week; -Counseled on diet and exercise extensively Recommended to continue current medication  Pre-diabetes/weight loss (A1c goal <6.5%) -Controlled -Current medications: Ozempic 0.5 mg inject once weekly - Appropriate, Query effective, Safe, Accessible -Medications previously tried: metformin  -Current home glucose readings fasting glucose: does not need to check post prandial glucose: does not need to check -Denies hypoglycemic/hyperglycemic symptoms -Current  meal patterns:  breakfast: n/a  lunch: n/a  dinner: n/a snacks: n/a drinks: n/a -Current exercise: does not exercise -Educated on A1c and blood sugar goals; -Counseled to check feet daily and get yearly eye exams -Counseled on diet and exercise extensively Recommended to continue current medication  Depression/Anxiety (Goal: minimize symptoms) -Not ideally controlled -Current treatment: Alprazolam 1 mg 2 tablets three times daily as needed (taking 2 every 6 hours) - Appropriate, Effective, Query Safe, Accessible Quetiapine 400 mg 2 tablets at bedtime - Appropriate, Query effective, Safe, Accessible Venlafaxine XR 75 mg 3 capsules daily (taking 3 times a day) - Appropriate, Effective, Query Safe, Accessible  -Medications previously tried/failed: unknown -PHQ9: 1 -GAD7: n/a -Educated on Benefits of medication for symptom control Benefits of cognitive-behavioral therapy with or without medication -Recommended to continue current medication Counseled on rebound anxiety with alprazolam and recommended discussing with psychiatrist about switching to longer acting benzo. Recommended taking 2 of the venlafaxine in the morning and 1 in the afternoon to lessen possible risk of insomnia.   GERD (Goal: minimize symptoms) -Controlled -Current treatment  Omeprazole 20 mg 1 capsule daily as needed - Appropriate, Effective, Safe, Accessible -Medications previously tried: none  -Counseled on taking it every night prior to when heartburn is bothering her.  Fibromyalgia (Goal: minimize pain) -Controlled -Current treatment  Lyrica 75 mg 1 capsule daily (twice daily made her dizzy) - Appropriate, Effective, Query Safe, Accessible -Medications previously tried: gabapentin, duloxetine (duplicate with Effexor)  -Recommended to continue current medication  Hypothyroidism (Goal: 0.35-4.5) -Controlled -Current treatment  Levothyroxine 25 mcg 1 tablet daily - Appropriate, Effective, Safe,  Accessible -Medications previously tried: none  -Counseled on importance of separating from other medications on an empty stomach.  Health Maintenance -Vaccine gaps: influenza, prevnar, shingrix, COVID booster -Current therapy:  Meclizine 25 mg as needed Fiberwell daily -Educated on Cost vs benefit of each product must be carefully  weighed by individual consumer Supplements may interfere with prescription drugs -Patient is satisfied with current therapy and denies issues -Recommended to continue current medication  Patient Goals/Self-Care Activities Patient will:  - take medications as prescribed as evidenced by patient report and record review target a minimum of 150 minutes of moderate intensity exercise weekly  Follow Up Plan: Telephone follow up appointment with care management team member scheduled for: 6 months       Patient verbalizes understanding of instructions and care plan provided today and agrees to view in [place]. Active MyChart status and patient understanding of how to access instructions and care plan via MyChart confirmed with patient.    The pharmacy team will reach out to the patient again over the next 90 days.   [name], [facility]

## 2021-12-28 DIAGNOSIS — M545 Low back pain, unspecified: Secondary | ICD-10-CM | POA: Diagnosis not present

## 2021-12-28 DIAGNOSIS — M542 Cervicalgia: Secondary | ICD-10-CM | POA: Diagnosis not present

## 2022-01-02 ENCOUNTER — Telehealth: Payer: Self-pay | Admitting: Adult Health

## 2022-01-02 NOTE — Telephone Encounter (Addendum)
Pt called to request refills of the following: levothyroxine (SYNTHROID) 25 MCG tablet  venlafaxine XR (EFFEXOR-XR) 75 MG 24 hr capsule  omeprazole (PRILOSEC) 20 MG capsule  QUEtiapine (SEROQUEL) 400 MG tablet  LOV:  09/20/2021 Next:  01/31/2022  Please advise.  [place], [place] Phone:  [phone]  Fax:  [phone]

## 2022-01-04 ENCOUNTER — Other Ambulatory Visit: Payer: Self-pay

## 2022-01-04 MED ORDER — LEVOTHYROXINE SODIUM 25 MCG PO TABS: 25.0000 ug | ORAL_TABLET | Freq: Every day | ORAL | 0 refills | Status: DC

## 2022-01-04 NOTE — Telephone Encounter (Signed)
Called pt and advised that TSH medication has been sent in until her TSH check at [facility] next month. The other Rx need to come from [facility]. Pt verbalized understanding.

## 2022-01-04 NOTE — Telephone Encounter (Signed)
Pt is calling and she is aware refills can take up to 3 business days

## 2022-01-10 ENCOUNTER — Other Ambulatory Visit: Payer: Self-pay | Admitting: Adult Health

## 2022-01-12 ENCOUNTER — Ambulatory Visit: Payer: Medicare HMO | Admitting: Adult Health

## 2022-01-12 ENCOUNTER — Encounter: Payer: Self-pay | Admitting: Adult Health

## 2022-01-12 DIAGNOSIS — G4700 Insomnia, unspecified: Secondary | ICD-10-CM

## 2022-01-12 DIAGNOSIS — F331 Major depressive disorder, recurrent, moderate: Secondary | ICD-10-CM

## 2022-01-12 DIAGNOSIS — F411 Generalized anxiety disorder: Secondary | ICD-10-CM | POA: Diagnosis not present

## 2022-01-12 DIAGNOSIS — R69 Illness, unspecified: Secondary | ICD-10-CM | POA: Diagnosis not present

## 2022-01-12 DIAGNOSIS — F410 Panic disorder [episodic paroxysmal anxiety] without agoraphobia: Secondary | ICD-10-CM | POA: Diagnosis not present

## 2022-01-12 MED ORDER — ALPRAZOLAM 1 MG PO TABS: ORAL_TABLET | ORAL | 2 refills | Status: DC

## 2022-01-12 NOTE — Progress Notes (Signed)
Leslie Benson [id number] Aug 28, 1960 61 y.o.  Subjective:   Patient ID:  Leslie Benson is a 61 y.o. (DOB 1960-12-28) female.  Chief Complaint: No chief complaint on file.   HPI Leslie Benson presents to the office today for follow-up of GAD, MDD, panic attacks and insomnia.  Describes mood today as "ok". Pleasant. Tearful at times. Mood symptoms - reports decreased depression, anxiety, and irritability. Decreased panic attacks. Mood is consistent. Stating "I'm doing alright". Reports chronic pain issues. Husband retired. Feels like medications are helpful. Varying interest and motivation. Taking medications as prescribed.  Energy levels lower - "I feel tired sometimes". Active, does not have a regular exercise routine.   Enjoys some usual interests and activities. Married. Lives with husband. Has 3 grown daughters - 2 grandchildren. Spending time with family. Talking to sisters. Appetite adequate. Weight loss - 178 from 195 pounds. Sleeps well most nights. Averages 6 hours with Seroquel.   Focus and concentration difficulties. Completing tasks. Managing aspects of household. Receives SSI disability benefits for Fibromyalgia - 20 years.  Denies SI or HI.  Denies AH or VH.  Previous medication trials: Unknown    PHQ2-9    [place] Office Visit from 12/07/2021 in [place] at [place] Most recent reading at 12/07/2021  3:11 PM Video Visit from 07/27/2021 in [place] at [place] Most recent reading at 07/27/2021  3:13 PM Clinical Support from 11/30/2020 in [place] at [place] Most recent reading at 11/30/2020  3:24 PM Office Visit from 11/30/2020 in [place] at [place] Most recent reading at 11/30/2020 12:58 PM Clinical Support from 10/28/2019 in [place] at [place] Most recent reading at 10/28/2019  2:08 PM  PHQ-2 Total Score '3 6 1 1 4  '$ PHQ-9 Total Score 8 9 -- -- [place] Visit from 12/07/2021 in  [place] at [place] [facility] to Hosp-Admission (Discharged) from 12/01/2020 in [facility]  C-SSRS RISK CATEGORY Low Risk No Risk        Review of Systems:  Review of Systems  Musculoskeletal:  Negative for gait problem.  Neurological:  Negative for tremors.  Psychiatric/Behavioral:         Please refer to HPI    Medications: I have reviewed the patient's current medications.  Current Outpatient Medications  Medication Sig Dispense Refill   ALPRAZolam (XANAX) 1 MG tablet Take 2 tablets by mouth three times a day as needed for anxiety 180 tablet 2   atorvastatin (LIPITOR) 40 MG tablet TAKE 1 TABLET BY MOUTH DAILY 90 tablet 3   levothyroxine (SYNTHROID) 25 MCG tablet Take 1 tablet (25 mcg total) by mouth daily before breakfast. 90 tablet 0   meclizine (ANTIVERT) 25 MG tablet TAKE 1 TABLET BY MOUTH THREE TIMES DAILY AS NEEDED FOR DIZZINESS 30 tablet 0   Multiple Vitamin (MULTIVITAMIN ADULT PO) Take 1 tablet by mouth daily.     omega-3 acid ethyl esters (LOVAZA) 1 G capsule Take 1 g by mouth 2 (two) times daily.     omeprazole (PRILOSEC) 20 MG capsule Take 1 capsule by mouth once daily 90 capsule 0   OVER THE COUNTER MEDICATION Take 1 tablet by mouth daily. Fiber Well     pregabalin (LYRICA) 75 MG capsule Take 1 capsule by mouth twice daily 60 capsule 2   QUEtiapine (SEROQUEL) 400 MG tablet Take 2 tablets (800 mg total) by mouth at bedtime. 180 tablet 1   Semaglutide,0.25 or 0.'5MG'$ /DOS, (Primrose,  0.25 OR 0.5 MG/DOSE,) 2 MG/1.5ML SOPN Inject 0.5 mg into the skin once a week.     venlafaxine XR (EFFEXOR-XR) 75 MG 24 hr capsule TAKE 3 CAPSULES EVERY DAY 270 capsule 1   No current facility-administered medications for this visit.    Medication Side Effects: None  Allergies:  Allergies  Allergen Reactions   Codeine Nausea And Vomiting   Compazine [Prochlorperazine Edisylate]     "feels weird"   Sulfa Antibiotics     Unknown reaction.    Past Medical History:   Diagnosis Date   ADHD    Allergy    Anemia    past hx    Anxiety    Barrett esophagus    Breast nodule    benign   Cervical arthritis 09/22/2013   Depression    Elevated liver enzymes    Fatty liver    Fibromyalgia    Fibromyalgia    GERD (gastroesophageal reflux disease)    H/O hiatal hernia    Headache(784.0)    migraines   High cholesterol    History of UTI    Lymphocytic colitis    Migraines    Pain    Panic attack    Paraesophageal hiatal hernia    repaired 2009   Spinal stenosis    Thyroid disease    Thyroid disease     Past Medical History, Surgical history, Social history, and Family history were reviewed and updated as appropriate.   Please see review of systems for further details on the patient's review from today.   Objective:   Physical Exam:  There were no vitals taken for this visit.  Physical Exam Constitutional:      General: She is not in acute distress. Musculoskeletal:        General: No deformity.  Neurological:     Mental Status: She is alert and oriented to person, place, and time.     Coordination: Coordination normal.  Psychiatric:        Attention and Perception: Attention and perception normal. She does not perceive auditory or visual hallucinations.        Mood and Affect: Mood normal. Mood is not anxious or depressed. Affect is not labile, blunt, angry or inappropriate.        Speech: Speech normal.        Behavior: Behavior normal.        Thought Content: Thought content normal. Thought content is not paranoid or delusional. Thought content does not include homicidal or suicidal ideation. Thought content does not include homicidal or suicidal plan.        Cognition and Memory: Cognition and memory normal.        Judgment: Judgment normal.     Comments: Insight intact     Lab Review:     Component Value Date/Time   NA 138 06/22/2021 1439   NA 141 10/01/2019 1323   K 4.4 06/22/2021 1439   CL 100 06/22/2021 1439   CO2 30  06/22/2021 1439   GLUCOSE 96 06/22/2021 1439   BUN 11 06/22/2021 1439   BUN 10 10/01/2019 1323   CREATININE 0.98 06/22/2021 1439   CALCIUM 9.8 06/22/2021 1439   PROT 6.3 (L) 12/03/2020 0443   PROT 7.2 09/25/2018 1125   ALBUMIN 3.9 12/03/2020 0443   ALBUMIN 4.8 09/25/2018 1125   AST 21 12/03/2020 0443   ALT 33 12/03/2020 0443   ALKPHOS 94 12/03/2020 0443   BILITOT 0.4 12/03/2020 0443   BILITOT 0.3  09/25/2018 1125   GFRNONAA >60 12/03/2020 0443   GFRAA 67 10/01/2019 1323       Component Value Date/Time   WBC 5.3 12/03/2020 0443   RBC 3.91 12/03/2020 0443   HGB 12.1 12/03/2020 0443   HCT 37.9 12/03/2020 0443   PLT 215 12/03/2020 0443   MCV 96.9 12/03/2020 0443   MCH 30.9 12/03/2020 0443   MCHC 31.9 12/03/2020 0443   RDW 13.2 12/03/2020 0443   LYMPHSABS 3.8 12/01/2020 2215   MONOABS 0.8 12/01/2020 2215   EOSABS 0.1 12/01/2020 2215   BASOSABS 0.1 12/01/2020 2215    No results found for: "POCLITH", "LITHIUM"   No results found for: "PHENYTOIN", "PHENOBARB", "VALPROATE", "CBMZ"   .res Assessment: Plan:    Plan:  PDMP reviewed  1. Decrease Xanax '1mg'$  - 6 times to 5 times daily 2. Seroquel '400mg'$  - 2 at hs  3. Effexor XR '75mg'$  TID  RTC 6 months - will call in 3 months for next Xanax prescription.  Patient advised to contact office with any questions, adverse effects, or acute worsening in signs and symptoms.  Discussed potential benefits, risk, and side effects of benzodiazepines to include potential risk of tolerance and dependence, as well as possible drowsiness.  Advised patient not to drive if experiencing drowsiness and to take lowest possible effective dose to minimize risk of dependence and tolerance.  Discussed potential metabolic side effects associated with atypical antipsychotics, as well as potential risk for movement side effects. Advised pt to contact office if movement side effects occur.   Diagnoses and all orders for this visit:  Generalized anxiety  disorder  Insomnia, unspecified type  Panic attacks  Major depressive disorder, recurrent episode, moderate ([place])     Please see After Visit Summary for patient specific instructions.  Future Appointments  Date Time Provider [place]  01/31/2022  3:30 PM [name], NP [facility]  12/12/2022  9:00 AM [facility]SE HEALTH ADVISOR [facility]    No orders of the defined types were placed in this encounter.   -------------------------------

## 2022-01-23 IMAGING — CT CT HEART MORP W/ CTA COR W/ SCORE W/ CA W/CM &/OR W/O CM
4 of 7 series · 8 of 20 positions shown, 9 images · IV contrast (APPLIED)
Comparison: CT abdomen and pelvis 09/23/2015 and chest CT
07/04/2014

Addendum:
CLINICAL DATA: 58 year old female with strong family history of
CAD, dyspnea.

EXAM:
Cardiac/Coronary  CTA
TECHNIQUE: The patient was scanned on a [employer] Force scanner.

[Series 6: best diast 71 % · axial · 0.34mm/px · z∈[-77,-36]mm · 2 of 313 slices shown, 3 images]
[im 105/313  vessel]
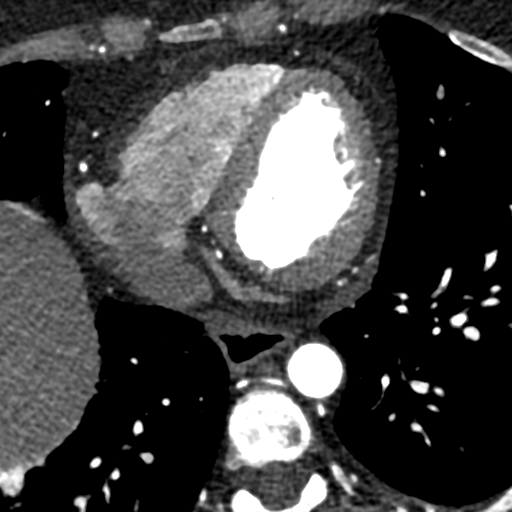
[im 105/313  lung]
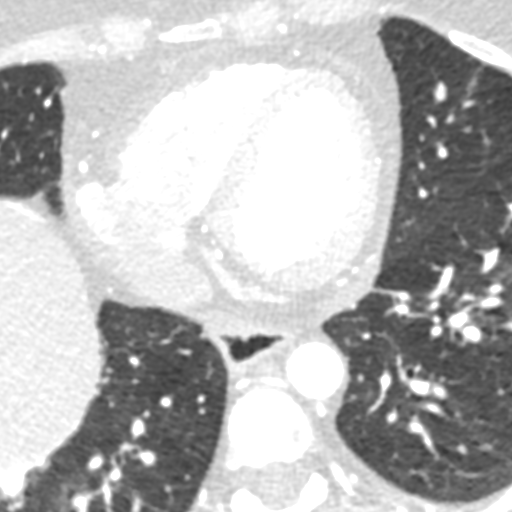
[im 209/313  vessel]
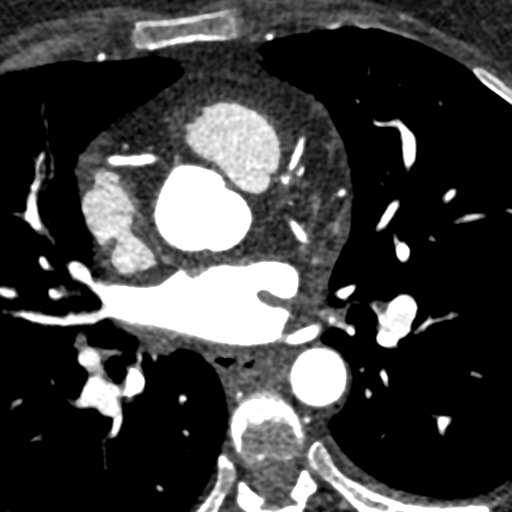

[Series 7: best syst 39 % · axial · 0.34mm/px · z∈[-77,-36]mm · 2 of 313 slices shown]
[im 105/313  vessel]
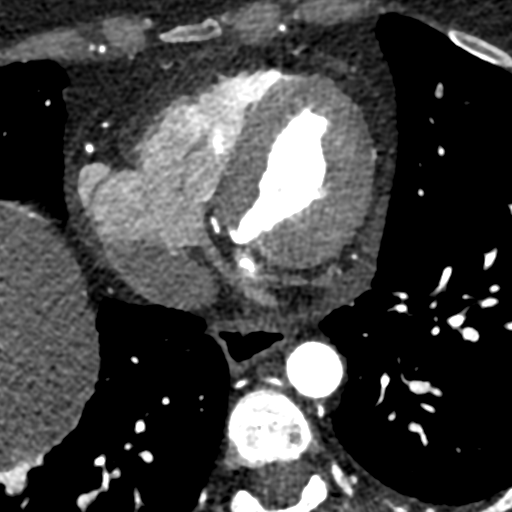
[im 209/313  vessel]
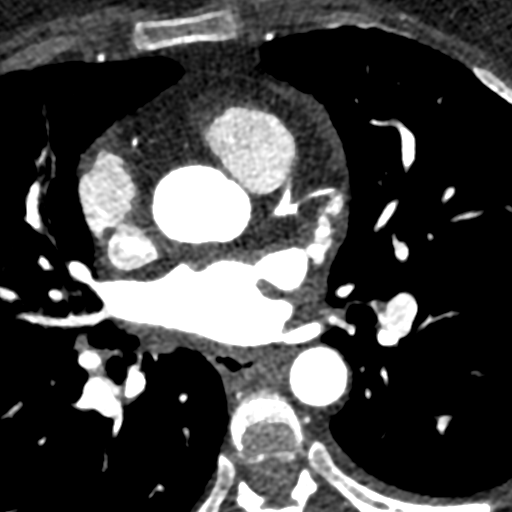

[Series 8: ts diast sharp 71 % · axial · 0.34mm/px · z∈[-77,-36]mm · 2 of 313 slices shown]
[im 105/313  lung]
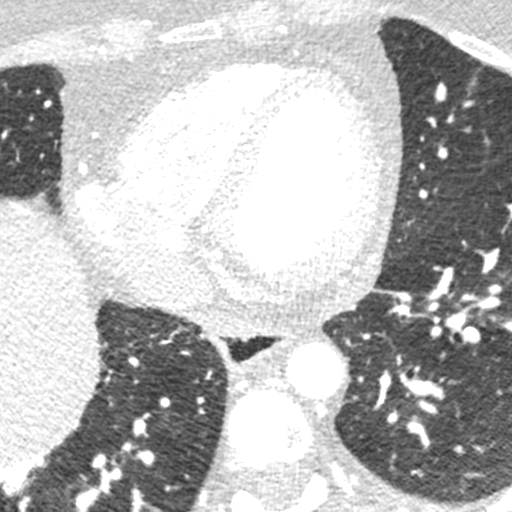
[im 209/313  lung]
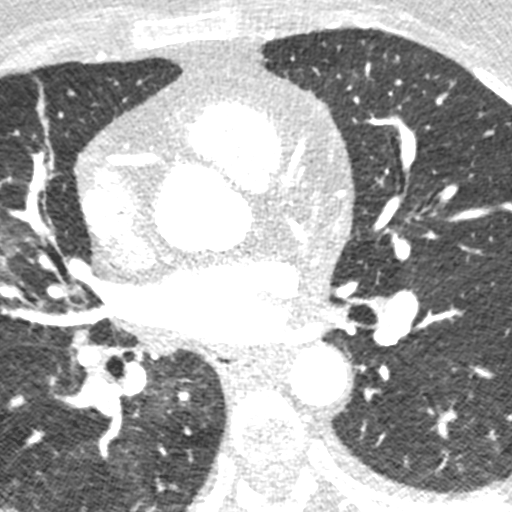

[Series 9: ts syst sharp 39 % · axial · 0.34mm/px · z∈[-77,-36]mm · 2 of 313 slices shown]
[im 105/313  lung]
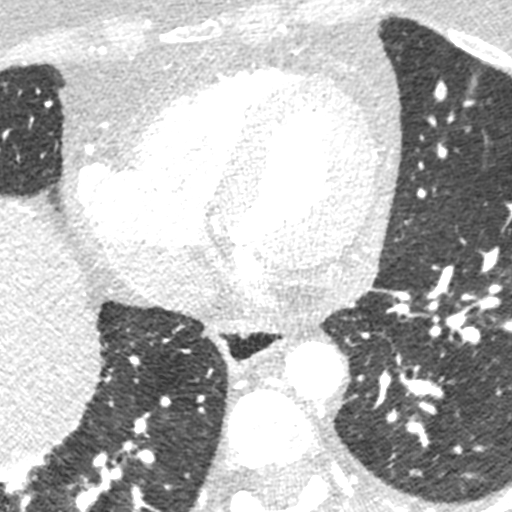
[im 209/313  lung]
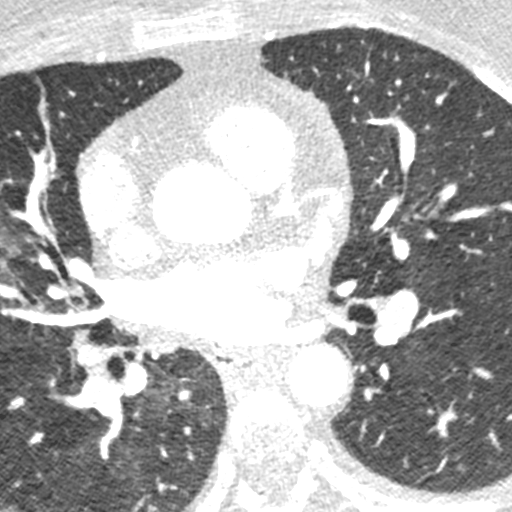

[8 of 20 positions shown; findings below may reference images not displayed]

FINDINGS: A 90 kV prospective scan was triggered in the descending thoracic
aorta at 111 HU's. Axial non-contrast 3 mm slices were carried out
through the heart. The data set was analyzed on a dedicated work
station and scored using the Agatson method. Gantry rotation speed
was 250 msecs and collimation was .6 mm. Ivabradine and beta
blockade and 0.8 mg of sl NTG was given. The 3D data set was
reconstructed in 5% intervals of the 67-82 % of the R-R cycle.
Diastolic phases were analyzed on a dedicated work station using
MPR, MIP and VRT modes. The patient received 80 cc of contrast.

Aorta:  Normal size.  Mild atherosclerosis of arch.  No dissection.

Aortic Valve:  Trileaflet.  No calcifications.

Coronary Arteries:  Normal coronary origin.  Right dominance.

RCA is a large dominant artery that gives rise to PDA and PLA. There
is no plaque.

Left main is a large artery that gives rise to LAD and LCX arteries.

LAD is a large vessel that has ostial calcified non flow limiting
plaque (0-24% stenosis). There is also mild luminal irregularity in
the mid section (10-20% stenosis).

LCX is a non-dominant artery that gives rise to two moderate sized
OM branches (first is high takeoff/Ramus). There is no plaque.

Other findings:

Normal pulmonary vein drainage into the left atrium.

Normal left atrial appendage without a thrombus.

Normal size of the pulmonary artery.

Please see radiology report for non cardiac findings.
IMPRESSION: 1. Coronary calcium score of 5. This was 68 percentile for age and
sex matched control.

2. Normal coronary origin with right dominance.

3.  Minimal non flow limiting CAD of LAD (0-24% stenosis).

4.  Aortic arch atherosclerosis.

5. CAD-RADS 1. Minimal non-obstructive CAD (0-24%). Consider
preventive therapy and risk factor modification.

EXAM:
OVER-READ INTERPRETATION  CT CHEST

The following report is an over-read performed by radiologist Dr.
does not include interpretation of cardiac or coronary anatomy or
pathology. The coronary CTA interpretation by the cardiologist is
attached.
FINDINGS: Ascending thoracic aorta measures up to 2.7 cm. Descending thoracic
aorta measures 1.9 cm. Incomplete opacification of the pulmonary
arteries. No significant pericardial fluid. Visualized mediastinal
structures are unremarkable. History of paraesophageal hernia repair
with Nissen fundoplication. There is a change in the appearance of
the proximal stomach with an adjacent fluid-filled structure just
below the left hemidiaphragm that was not present on the exam from
2337. This fluid-filled structure has high-dense material along the
periphery based on the non contrast images. This could be related to
a gastric diverticulum or an issue with the fundoplication.
Incomplete evaluation of the stomach. Streaky densities at the lung
bases are suggestive for atelectasis or scarring. Trace right
pleural fluid or pleural thickening is similar to the exam in 2337.
Bone structures are unremarkable.
IMPRESSION: New fluid-filled structure in the upper abdomen adjacent to the
postsurgical changes at the stomach. This area is incompletely
evaluated but findings could represent a large gastric diverticulum
or complication related to the previous gastric surgery. Recommend a
dedicated CT of the abdomen and pelvis with IV and oral contrast for
further characterization.

These results will be called to the ordering clinician or
representative by the Radiologist Assistant, and communication
documented in the PACS or [REDACTED].

*** End of Addendum ***
FINDINGS: A 90 kV prospective scan was triggered in the descending thoracic
aorta at 111 HU's. Axial non-contrast 3 mm slices were carried out
through the heart. The data set was analyzed on a dedicated work
station and scored using the Agatson method. Gantry rotation speed
was 250 msecs and collimation was .6 mm. Ivabradine and beta
blockade and 0.8 mg of sl NTG was given. The 3D data set was
reconstructed in 5% intervals of the 67-82 % of the R-R cycle.
Diastolic phases were analyzed on a dedicated work station using
MPR, MIP and VRT modes. The patient received 80 cc of contrast.

Aorta:  Normal size.  Mild atherosclerosis of arch.  No dissection.

Aortic Valve:  Trileaflet.  No calcifications.

Coronary Arteries:  Normal coronary origin.  Right dominance.

RCA is a large dominant artery that gives rise to PDA and PLA. There
is no plaque.

Left main is a large artery that gives rise to LAD and LCX arteries.

LAD is a large vessel that has ostial calcified non flow limiting
plaque (0-24% stenosis). There is also mild luminal irregularity in
the mid section (10-20% stenosis).

LCX is a non-dominant artery that gives rise to two moderate sized
OM branches (first is high takeoff/Ramus). There is no plaque.

Other findings:

Normal pulmonary vein drainage into the left atrium.

Normal left atrial appendage without a thrombus.

Normal size of the pulmonary artery.

Please see radiology report for non cardiac findings.
IMPRESSION: 1. Coronary calcium score of 5. This was 68 percentile for age and
sex matched control.

2. Normal coronary origin with right dominance.

3.  Minimal non flow limiting CAD of LAD (0-24% stenosis).

4.  Aortic arch atherosclerosis.

5. CAD-RADS 1. Minimal non-obstructive CAD (0-24%). Consider
preventive therapy and risk factor modification.

## 2022-01-27 DIAGNOSIS — M542 Cervicalgia: Secondary | ICD-10-CM | POA: Diagnosis not present

## 2022-01-27 DIAGNOSIS — M5451 Vertebrogenic low back pain: Secondary | ICD-10-CM | POA: Diagnosis not present

## 2022-01-29 ENCOUNTER — Other Ambulatory Visit: Payer: Self-pay | Admitting: Adult Health

## 2022-01-29 DIAGNOSIS — E118 Type 2 diabetes mellitus with unspecified complications: Secondary | ICD-10-CM

## 2022-01-31 ENCOUNTER — Ambulatory Visit (INDEPENDENT_AMBULATORY_CARE_PROVIDER_SITE_OTHER): Payer: Medicare HMO | Admitting: Adult Health

## 2022-01-31 VITALS — BP 98/70 | HR 97 | Temp 99.3°F | Wt 179.2 lb

## 2022-01-31 DIAGNOSIS — R7303 Prediabetes: Secondary | ICD-10-CM | POA: Diagnosis not present

## 2022-01-31 DIAGNOSIS — M797 Fibromyalgia: Secondary | ICD-10-CM | POA: Diagnosis not present

## 2022-01-31 LAB — POCT GLYCOSYLATED HEMOGLOBIN (HGB A1C): Hemoglobin A1C: 5.1 % (ref 4.0–5.6)

## 2022-01-31 NOTE — Progress Notes (Signed)
Subjective:    Patient ID: Leslie Benson, female    DOB: 1961-01-20, 61 y.o.   MRN: [id number]  HPI  61 year old female who  has a past medical history of ADHD, Allergy, Anemia, Anxiety, Barrett esophagus, Breast nodule, Cervical arthritis (09/22/2013), Depression, Elevated liver enzymes, Fatty liver, Fibromyalgia, Fibromyalgia, GERD (gastroesophageal reflux disease), H/O hiatal hernia, Headache(784.0), High cholesterol, History of UTI, Lymphocytic colitis, Migraines, Pain, Panic attack, Paraesophageal hiatal hernia, Spinal stenosis, Thyroid disease, and Thyroid disease.  She presents to the office today for follow up regarding fibromyalgia and pre diabetes   Fibromyalgia -currently prescribed Lyrica 75 mg twice daily.  She does feel as though this medication works to some degree to help with her fibromyalgia pain but has noticed that when she takes it twice a day she starts to feel jittery.  She has been taking 1 in the morning and 1 in the afternoon.  Additionally due to history of prediabetes and obesity she is currently prescribed Ozempic 0.5 mg weekly.  She has tolerated this medication well no side effects.  She is eating healthier and has been able to lose weight.  Overall she feels much healthier.   Wt Readings from Last 5 Encounters:  01/31/22 179 lb 3.2 oz (81.3 kg)  12/07/21 183 lb (83 kg)  09/20/21 195 lb (88.5 kg)  07/27/21 204 lb (92.5 kg)  06/28/21 204 lb 3.2 oz (92.6 kg)    Review of Systems See HPI   Past Medical History:  Diagnosis Date   ADHD    Allergy    Anemia    past hx    Anxiety    Barrett esophagus    Breast nodule    benign   Cervical arthritis 09/22/2013   Depression    Elevated liver enzymes    Fatty liver    Fibromyalgia    Fibromyalgia    GERD (gastroesophageal reflux disease)    H/O hiatal hernia    Headache(784.0)    migraines   High cholesterol    History of UTI    Lymphocytic colitis    Migraines    Pain    Panic attack     Paraesophageal hiatal hernia    repaired 2009   Spinal stenosis    Thyroid disease    Thyroid disease     Social History   Socioeconomic History   Marital status: Married    Spouse name: Holiday representative   Number of children: Not on file   Years of education: Not on file   Highest education level: Not on file  Occupational History   Occupation: disabled  Tobacco Use   Smoking status: Never   Smokeless tobacco: Never  Vaping Use   Vaping Use: Never used  Substance and Sexual Activity   Alcohol use: Not Currently    Comment: wine occassionally   Drug use: No   Sexual activity: Yes    Birth control/protection: None    Comment: n/a partial hysterectomy  Other Topics Concern   Not on file  Social History Narrative   Married    Lived in [place].  Moved to [place] in early 2000s   She is on disability for anxiety    Social Determinants of Health   Financial Resource Strain: Low Risk  (12/21/2021)   Overall Financial Resource Strain (CARDIA)    Difficulty of Paying Living Expenses: Not hard at all  Food Insecurity: No Food Insecurity (12/07/2021)   Hunger Vital Sign    Worried About  Running Out of Food in the Last Year: Never true    [place] in the Last Year: Never true  Transportation Needs: No Transportation Needs (12/21/2021)   PRAPARE - Hydrologist (Medical): No    Lack of Transportation (Non-Medical): No  Physical Activity: Inactive (12/07/2021)   Exercise Vital Sign    Days of Exercise per Week: 0 days    Minutes of Exercise per Session: 0 min  Stress: Stress Concern Present (12/07/2021)   [place]    Feeling of Stress : Rather much  Social Connections: Moderately Isolated (12/07/2021)   Social Connection and Isolation Panel [NHANES]    Frequency of Communication with Friends and Family: More than three times a week    Frequency of Social Gatherings with Friends and  Family: More than three times a week    Attends Religious Services: Never    Marine scientist or Organizations: No    Attends Archivist Meetings: Never    Marital Status: Married  Human resources officer Violence: Not At Risk (12/07/2021)   Humiliation, Afraid, Rape, and Kick questionnaire    Fear of Current or Ex-Partner: No    Emotionally Abused: No    Physically Abused: No    Sexually Abused: No    Past Surgical History:  Procedure Laterality Date   ANKLE SURGERY     CHOLECYSTECTOMY  1980s?   COLONOSCOPY  2010   Buccini   EUS N/A 11/19/2013   Procedure: ESOPHAGEAL ENDOSCOPIC ULTRASOUND (EUS) RADIAL;  Surgeon: Arta Silence, MD;  Location: [facility];  Service: Endoscopy;  Laterality: N/A;   LAPAROSCOPIC NISSEN FUNDOPLICATION  39/76/7341   LAPAROSCOPIC PARAESOPHAGEAL HERNIA REPAIR  03/05/2008   Dr Johney Maine   TUBAL LIGATION  1990s?   UPPER GASTROINTESTINAL ENDOSCOPY  04/05/2018   Scarlette Shorts    VAGINAL HYSTERECTOMY  03/04/2001    Family History  Problem Relation Age of Onset   Alcohol abuse Other    Elevated Lipids Other    Heart disease Other    Anxiety disorder Mother    Obesity Mother    High Cholesterol Mother    Thyroid cancer Mother    Depression Mother    Breast cancer Mother    Bone cancer Mother    High Cholesterol Father    Heart disease Father    Alcoholism Father    Colon cancer Maternal Aunt    Colon polyps Sister    Stomach cancer Neg Hx    Esophageal cancer Neg Hx    Rectal cancer Neg Hx     Allergies  Allergen Reactions   Codeine Nausea And Vomiting   Compazine [Prochlorperazine Edisylate]     "feels weird"   Sulfa Antibiotics     Unknown reaction.    Current Outpatient Medications on File Prior to Visit  Medication Sig Dispense Refill   ALPRAZolam (XANAX) 1 MG tablet Take one tablet five times daily for anxiety and panic attacks. 150 tablet 2   atorvastatin (LIPITOR) 40 MG tablet TAKE 1 TABLET BY MOUTH DAILY 90 tablet 3    levothyroxine (SYNTHROID) 25 MCG tablet Take 1 tablet (25 mcg total) by mouth daily before breakfast. 90 tablet 0   meclizine (ANTIVERT) 25 MG tablet TAKE 1 TABLET BY MOUTH THREE TIMES DAILY AS NEEDED FOR DIZZINESS 30 tablet 0   Multiple Vitamin (MULTIVITAMIN ADULT PO) Take 1 tablet by mouth daily.     omega-3 acid  ethyl esters (LOVAZA) 1 G capsule Take 1 g by mouth 2 (two) times daily.     omeprazole (PRILOSEC) 20 MG capsule Take 1 capsule by mouth once daily 90 capsule 0   OVER THE COUNTER MEDICATION Take 1 tablet by mouth daily. Fiber Well     OZEMPIC, 0.25 OR 0.5 MG/DOSE, 2 MG/3ML SOPN INJECT 0.5 MG SUBCUTANEOUSLY ONCE A WEEK 3 mL 1   pregabalin (LYRICA) 75 MG capsule Take 1 capsule by mouth twice daily 60 capsule 2   QUEtiapine (SEROQUEL) 400 MG tablet Take 2 tablets (800 mg total) by mouth at bedtime. 180 tablet 1   Semaglutide,0.25 or 0.'5MG'$ /DOS, (OZEMPIC, 0.25 OR 0.5 MG/DOSE,) 2 MG/1.5ML SOPN Inject 0.5 mg into the skin once a week.     venlafaxine XR (EFFEXOR-XR) 75 MG 24 hr capsule TAKE 3 CAPSULES EVERY DAY 270 capsule 1   No current facility-administered medications on file prior to visit.    BP 98/70 (BP Location: Left Arm, Patient Position: Sitting, Cuff Size: Normal)   Pulse 97   Temp 99.3 F (37.4 C) (Oral)   Wt 179 lb 3.2 oz (81.3 kg)   SpO2 94%   BMI 30.76 kg/m       Objective:   Physical Exam Vitals and nursing note reviewed.  Constitutional:      Appearance: Normal appearance.  Cardiovascular:     Rate and Rhythm: Normal rate and regular rhythm.     Heart sounds: Normal heart sounds.  Pulmonary:     Effort: Pulmonary effort is normal.     Breath sounds: Normal breath sounds.  Musculoskeletal:        General: Normal range of motion.  Skin:    General: Skin is warm and dry.     Capillary Refill: Capillary refill takes less than 2 seconds.  Neurological:     General: No focal deficit present.     Mental Status: She is alert and oriented to person, place, and  time.  Psychiatric:        Mood and Affect: Mood normal.        Behavior: Behavior normal.        Thought Content: Thought content normal.        Judgment: Judgment normal.       Assessment & Plan:  1. Pre-diabetes  - POC HgB A1c- 5.1  - Hemoglobin A1c; Future - Continue with ozempic 0.5 mg weekly.   2. Fibromyalgia - Advised to take one tab of lyrica in the morning and one tab in the evening before bed  [name], NP  Time spent with patient today was 30 minutes which consisted of chart review, discussing diagnosis, work up, treatment answering questions and documentation.

## 2022-02-03 ENCOUNTER — Other Ambulatory Visit: Payer: Self-pay | Admitting: Adult Health

## 2022-02-03 DIAGNOSIS — G4700 Insomnia, unspecified: Secondary | ICD-10-CM

## 2022-02-03 DIAGNOSIS — F410 Panic disorder [episodic paroxysmal anxiety] without agoraphobia: Secondary | ICD-10-CM

## 2022-02-23 ENCOUNTER — Telehealth: Payer: Self-pay | Admitting: Pharmacist

## 2022-02-23 NOTE — Chronic Care Management (AMB) (Unsigned)
Chronic Care Management Pharmacy Assistant   Name: Leslie Benson  MRN: [id number] DOB: 11-Aug-1960  Reason for Encounter: General Assessment   Recent office visits:  01/31/22 Leslie Peng, NP  - Patient presented for Pre-diabetes and other concerns. No medication changes.  Recent consult visits:  01/12/22 Leslie Benson, [name]) - Patient presented for Generalized Anxiety Disorder. No other visit details available.  Hospital visits:  None in previous 6 months  Medications: Outpatient Encounter Medications as of 02/23/2022  Medication Sig   ALPRAZolam (XANAX) 1 MG tablet Take one tablet five times daily for anxiety and panic attacks.   atorvastatin (LIPITOR) 40 MG tablet TAKE 1 TABLET BY MOUTH DAILY   levothyroxine (SYNTHROID) 25 MCG tablet Take 1 tablet (25 mcg total) by mouth daily before breakfast.   meclizine (ANTIVERT) 25 MG tablet TAKE 1 TABLET BY MOUTH THREE TIMES DAILY AS NEEDED FOR DIZZINESS   Multiple Vitamin (MULTIVITAMIN ADULT PO) Take 1 tablet by mouth daily.   omega-3 acid ethyl esters (LOVAZA) 1 G capsule Take 1 g by mouth 2 (two) times daily.   omeprazole (PRILOSEC) 20 MG capsule Take 1 capsule by mouth once daily   OVER THE COUNTER MEDICATION Take 1 tablet by mouth daily. Fiber Well   OZEMPIC, 0.25 OR 0.5 MG/DOSE, 2 MG/3ML SOPN INJECT 0.5 MG SUBCUTANEOUSLY ONCE A WEEK   pregabalin (LYRICA) 75 MG capsule Take 1 capsule by mouth twice daily   QUEtiapine (SEROQUEL) 400 MG tablet Take 2 tablets (800 mg total) by mouth at bedtime.   Semaglutide,0.25 or 0.'5MG'$ /DOS, (OZEMPIC, 0.25 OR 0.5 MG/DOSE,) 2 MG/1.5ML SOPN Inject 0.5 mg into the skin once a week.   venlafaxine XR (EFFEXOR-XR) 75 MG 24 hr capsule TAKE 3 CAPSULES EVERY DAY   No facility-administered encounter medications on file as of 02/23/2022.   [place] for General Review Call  Adherence Review:  Does the Clinical Pharmacist Assistant have access to adherence rates?  Yes Adherence rates for STAR metric medications Atorvastatin (Lipitor) 40 mg - Last filled 12/05/21 90 DS at [facility] Atorvastatin (Lipitor) 40 mg - Last filled 09/08/21 60 DS at [facility] 0.25/0.5 Last filled 02/05/22 28 DS at [facility] Semaglutide 0.25/0.5 Last filled 01/08/22 28 DS at [facility]  Does the patient have >5 day gap between last estimated fill dates for any of the above medications or other medication gaps? Yes  Disease State Questions:  Able to connect with Patient? {yes/no:20286} Did patient have any problems with their health recently? {yes/no:20286} Note problems and Concerns: Have you had any admissions or emergency room visits or worsening of your condition(s) since last visit? {yes/no:20286} Details of ED visit, hospital visit and/or worsening condition(s): Have you had any visits with new specialists or providers since your last visit? {yes/no:20286} Explain: Have you had any new health care problem(s) since your last visit? {yes/no:20286} New problem(s) reported: Have you run out of any of your medications since you last spoke with clinical pharmacist? {yes/no:20286} What caused you to run out of your medications? Are there any medications you are not taking as prescribed? {yes/no:20286} What kept you from taking your medications as prescribed? Are you having any issues or side effects with your medications? {yes/no:20286} Note of issues or side effects: Do you have any other health concerns or questions you want to discuss with your Clinical Pharmacist before your next visit? {yes/no:20286} Note additional concerns and questions from Patient. Are there any health concerns that you feel we can do a  better job addressing? {yes/no:20286} Note Patient's response. Are you having any problems with any of the following since the last visit: (select all that apply)  {General Call:27390}  Details: 12. Any falls since last visit? {yes/no:20286}  Details: 13. Any  increased or uncontrolled pain since last visit? {yes/no:20286}  Details: 14. Next visit Type: {Telephone/Office:25179}       Visit with:        Date:        Time:  79. Additional Details? {yes/no:20286}   Maddie Dec/ Jan  Care Gaps: Diabetic Urine - Overdue Mammogram - Overdue COVID Booster - Overdue Flu Vaccine - Overdue CCM- AWV-12/07/21 Lab Results  Component Value Date   HGBA1C 5.1 01/31/2022    Star Rating Drugs: Atorvastatin (Lipitor) 40 mg - Last filled 12/05/21 90 DS at [place] filled 02/05/22 28 DS at [place] Pharmacist Assistant [phone]

## 2022-03-06 ENCOUNTER — Other Ambulatory Visit: Payer: Self-pay | Admitting: Adult Health

## 2022-03-06 DIAGNOSIS — E118 Type 2 diabetes mellitus with unspecified complications: Secondary | ICD-10-CM

## 2022-03-21 ENCOUNTER — Encounter: Payer: Medicare HMO | Admitting: Adult Health

## 2022-03-24 ENCOUNTER — Other Ambulatory Visit: Payer: Self-pay

## 2022-03-24 MED ORDER — ATORVASTATIN CALCIUM 40 MG PO TABS: ORAL_TABLET | ORAL | 0 refills | Status: DC

## 2022-03-27 ENCOUNTER — Telehealth: Payer: Self-pay | Admitting: Adult Health

## 2022-03-27 ENCOUNTER — Other Ambulatory Visit: Payer: Self-pay | Admitting: Adult Health

## 2022-03-27 NOTE — Telephone Encounter (Signed)
Pt called to request a refill of the  pregabalin (LYRICA) 75 MG capsule    Preferred Pharmacies  [place], [place] Phone: [phone]  Fax: [phone]     Pt is aware that NP is not in the office on Mondays.  LOV:  01/31/22

## 2022-03-28 ENCOUNTER — Other Ambulatory Visit: Payer: Self-pay

## 2022-03-28 MED ORDER — PREGABALIN 75 MG PO CAPS: 75.0000 mg | ORAL_CAPSULE | Freq: Two times a day (BID) | ORAL | 2 refills | Status: DC

## 2022-03-28 NOTE — Addendum Note (Signed)
Addended by: Apolinar Junes on: 03/28/2022 07:38 AM   Modules accepted: Orders

## 2022-03-29 ENCOUNTER — Other Ambulatory Visit: Payer: Self-pay | Admitting: Adult Health

## 2022-03-29 ENCOUNTER — Other Ambulatory Visit: Payer: Self-pay

## 2022-03-29 DIAGNOSIS — E118 Type 2 diabetes mellitus with unspecified complications: Secondary | ICD-10-CM

## 2022-03-29 DIAGNOSIS — F331 Major depressive disorder, recurrent, moderate: Secondary | ICD-10-CM

## 2022-03-29 DIAGNOSIS — F411 Generalized anxiety disorder: Secondary | ICD-10-CM

## 2022-03-29 DIAGNOSIS — G4700 Insomnia, unspecified: Secondary | ICD-10-CM

## 2022-03-29 MED ORDER — LEVOTHYROXINE SODIUM 25 MCG PO TABS: 25.0000 ug | ORAL_TABLET | Freq: Every day | ORAL | 0 refills | Status: DC

## 2022-03-29 MED ORDER — VENLAFAXINE HCL ER 75 MG PO CP24: ORAL_CAPSULE | ORAL | 1 refills | Status: DC

## 2022-03-29 MED ORDER — PREGABALIN 75 MG PO CAPS: 75.0000 mg | ORAL_CAPSULE | Freq: Two times a day (BID) | ORAL | 2 refills | Status: DC

## 2022-03-29 MED ORDER — OZEMPIC (0.25 OR 0.5 MG/DOSE) 2 MG/3ML ~~LOC~~ SOPN: PEN_INJECTOR | SUBCUTANEOUS | 0 refills | Status: DC

## 2022-03-29 MED ORDER — QUETIAPINE FUMARATE 400 MG PO TABS: 800.0000 mg | ORAL_TABLET | Freq: Every day | ORAL | 1 refills | Status: DC

## 2022-03-29 NOTE — Telephone Encounter (Signed)
Drema Balzarine with [facility] called to FU and ask NP to please send in new Rx for the following:  Ozempic 0.25/0.5 pen  And the: pregabalin (LYRICA) 75 MG capsule    [phone]

## 2022-03-29 NOTE — Telephone Encounter (Signed)
This has bene taking care of waiting on the approval of Lyrica.

## 2022-03-30 NOTE — Progress Notes (Signed)
Noted  

## 2022-04-01 ENCOUNTER — Other Ambulatory Visit: Payer: Self-pay | Admitting: Adult Health

## 2022-04-01 DIAGNOSIS — E118 Type 2 diabetes mellitus with unspecified complications: Secondary | ICD-10-CM

## 2022-04-22 ENCOUNTER — Other Ambulatory Visit: Payer: Self-pay | Admitting: Internal Medicine

## 2022-04-22 DIAGNOSIS — K222 Esophageal obstruction: Secondary | ICD-10-CM

## 2022-04-22 DIAGNOSIS — R1310 Dysphagia, unspecified: Secondary | ICD-10-CM

## 2022-04-22 DIAGNOSIS — K219 Gastro-esophageal reflux disease without esophagitis: Secondary | ICD-10-CM

## 2022-04-27 ENCOUNTER — Telehealth: Payer: Self-pay | Admitting: Internal Medicine

## 2022-04-27 DIAGNOSIS — R1310 Dysphagia, unspecified: Secondary | ICD-10-CM

## 2022-04-27 DIAGNOSIS — K222 Esophageal obstruction: Secondary | ICD-10-CM

## 2022-04-27 DIAGNOSIS — K219 Gastro-esophageal reflux disease without esophagitis: Secondary | ICD-10-CM

## 2022-04-27 MED ORDER — OMEPRAZOLE 20 MG PO CPDR: 20.0000 mg | DELAYED_RELEASE_CAPSULE | Freq: Every day | ORAL | 0 refills | Status: DC

## 2022-04-27 NOTE — Telephone Encounter (Signed)
Omeprazole refilled.

## 2022-04-27 NOTE — Telephone Encounter (Signed)
Inbound call from patient stating she needs a refill for Prilosec. Patient is requesting refill to be sent to [facility]. Please advise.

## 2022-04-27 NOTE — Telephone Encounter (Signed)
Patient has been scheduled to see Leslie Benson on 1/25 at 3:00

## 2022-04-27 NOTE — Telephone Encounter (Signed)
I have already refilled this multiple times accompanied by a note to the pharmacy that patient needs office visit.  She has not been seen in two and a half years.

## 2022-04-28 ENCOUNTER — Encounter: Payer: Self-pay | Admitting: Adult Health

## 2022-04-28 ENCOUNTER — Ambulatory Visit (INDEPENDENT_AMBULATORY_CARE_PROVIDER_SITE_OTHER): Payer: Medicare HMO | Admitting: Adult Health

## 2022-04-28 ENCOUNTER — Other Ambulatory Visit: Payer: Self-pay | Admitting: Adult Health

## 2022-04-28 VITALS — BP 98/70 | HR 83 | Temp 98.0°F | Ht 64.0 in | Wt 179.0 lb

## 2022-04-28 DIAGNOSIS — J0140 Acute pansinusitis, unspecified: Secondary | ICD-10-CM | POA: Diagnosis not present

## 2022-04-28 DIAGNOSIS — E785 Hyperlipidemia, unspecified: Secondary | ICD-10-CM | POA: Diagnosis not present

## 2022-04-28 DIAGNOSIS — F419 Anxiety disorder, unspecified: Secondary | ICD-10-CM | POA: Diagnosis not present

## 2022-04-28 DIAGNOSIS — F32A Depression, unspecified: Secondary | ICD-10-CM

## 2022-04-28 DIAGNOSIS — M797 Fibromyalgia: Secondary | ICD-10-CM | POA: Diagnosis not present

## 2022-04-28 DIAGNOSIS — E038 Other specified hypothyroidism: Secondary | ICD-10-CM | POA: Diagnosis not present

## 2022-04-28 DIAGNOSIS — R7303 Prediabetes: Secondary | ICD-10-CM

## 2022-04-28 DIAGNOSIS — Z0001 Encounter for general adult medical examination with abnormal findings: Secondary | ICD-10-CM

## 2022-04-28 DIAGNOSIS — Z0000 Encounter for general adult medical examination without abnormal findings: Secondary | ICD-10-CM

## 2022-04-28 DIAGNOSIS — Z1231 Encounter for screening mammogram for malignant neoplasm of breast: Secondary | ICD-10-CM

## 2022-04-28 LAB — COMPREHENSIVE METABOLIC PANEL
ALT: 43 U/L — ABNORMAL HIGH (ref 0–35)
AST: 30 U/L (ref 0–37)
Albumin: 4.5 g/dL (ref 3.5–5.2)
Alkaline Phosphatase: 128 U/L — ABNORMAL HIGH (ref 39–117)
BUN: 8 mg/dL (ref 6–23)
CO2: 29 mEq/L (ref 19–32)
Calcium: 9.8 mg/dL (ref 8.4–10.5)
Chloride: 103 mEq/L (ref 96–112)
Creatinine, Ser: 1.04 mg/dL (ref 0.40–1.20)
GFR: 57.3 mL/min — ABNORMAL LOW (ref >60.00)
Glucose, Bld: 93 mg/dL (ref 70–99)
Potassium: 4.2 mEq/L (ref 3.5–5.1)
Sodium: 140 mEq/L (ref 135–145)
Total Bilirubin: 0.4 mg/dL (ref 0.2–1.2)
Total Protein: 7.1 g/dL (ref 6.0–8.3)

## 2022-04-28 LAB — LIPID PANEL
Cholesterol: 167 mg/dL (ref 0–200)
HDL: 78.3 mg/dL (ref >39.00)
LDL Cholesterol: 72 mg/dL (ref 0–99)
NonHDL: 88.25
Total CHOL/HDL Ratio: 2
Triglycerides: 79 mg/dL (ref 0.0–149.0)
VLDL: 15.8 mg/dL (ref 0.0–40.0)

## 2022-04-28 LAB — CBC WITH DIFFERENTIAL/PLATELET
Basophils Absolute: 0 10*3/uL (ref 0.0–0.1)
Basophils Relative: 0.8 % (ref 0.0–3.0)
Eosinophils Absolute: 0.1 10*3/uL (ref 0.0–0.7)
Eosinophils Relative: 1.3 % (ref 0.0–5.0)
HCT: 42.1 % (ref 36.0–46.0)
Hemoglobin: 13.9 g/dL (ref 12.0–15.0)
Lymphocytes Relative: 40.2 % (ref 12.0–46.0)
Lymphs Abs: 2.2 10*3/uL (ref 0.7–4.0)
MCHC: 33 g/dL (ref 30.0–36.0)
MCV: 93.9 fl (ref 78.0–100.0)
Monocytes Absolute: 0.5 10*3/uL (ref 0.1–1.0)
Monocytes Relative: 9.2 % (ref 3.0–12.0)
Neutro Abs: 2.7 10*3/uL (ref 1.4–7.7)
Neutrophils Relative %: 48.5 % (ref 43.0–77.0)
Platelets: 256 10*3/uL (ref 150.0–400.0)
RBC: 4.49 Mil/uL (ref 3.87–5.11)
RDW: 14.1 % (ref 11.5–15.5)
WBC: 5.6 10*3/uL (ref 4.0–10.5)

## 2022-04-28 LAB — TSH: TSH: 1.19 u[IU]/mL (ref 0.35–5.50)

## 2022-04-28 LAB — HEMOGLOBIN A1C: Hgb A1c MFr Bld: 5.4 % (ref 4.6–6.5)

## 2022-04-28 MED ORDER — DOXYCYCLINE HYCLATE 100 MG PO CAPS: 100.0000 mg | ORAL_CAPSULE | Freq: Two times a day (BID) | ORAL | 0 refills | Status: DC

## 2022-04-28 NOTE — Progress Notes (Signed)
Subjective:    Patient ID: Leslie Benson, female    DOB: [birth year], 63 y.o.   MRN: [id number]  HPI Patient presents for yearly preventative medicine examination. She is a pleasant 62 year old female who  has a past medical history of ADHD, Allergy, Anemia, Anxiety, Barrett esophagus, Breast nodule, Cervical arthritis (09/22/2013), Depression, Elevated liver enzymes, Fatty liver, Fibromyalgia, Fibromyalgia, GERD (gastroesophageal reflux disease), H/O hiatal hernia, Headache(784.0), High cholesterol, History of UTI, Lymphocytic colitis, Migraines, Pain, Panic attack, Paraesophageal hiatal hernia, Spinal stenosis, Thyroid disease, and Thyroid disease.  Hyperlipidemia-managed with Lipitor 40 mg daily and Lovaza 1 g twice daily.  She denies myalgia or fatigue.  This is managed by cardiology Lab Results  Component Value Date   CHOL 187 11/30/2020   HDL 80.00 11/30/2020   LDLCALC 89 11/30/2020   LDLDIRECT 168.0 04/05/2017   TRIG 91.0 11/30/2020   CHOLHDL 2 11/30/2020   Anxiety/depression-seen by psychiatry.  Currently prescribed Effexor, Xanax, and Seroquel.  Fibromyalgia -managed with Lyrica 75 mg twice daily.  Hypothyroidism - takes synthroid 25 mg daily  Lab Results  Component Value Date   TSH 1.564 12/02/2020   Prediabetes -managed with Ozempic 0.5 mg weekly.  She has been tolerating this medication well with no side effects.  She is eating healthier and has not been able to lose weight.  Her all she is feeling much healthier. Lab Results  Component Value Date   HGBA1C 5.1 01/31/2022   Acute Issue  Sinusitis - she reports symptoms greater than 2 weeks. Symptoms include sinus pain and pressure, PND, headaches, and rhinorrhea. She denies fevers or chills.   All immunizations and health maintenance protocols were reviewed with the patient and needed orders were placed.  Appropriate screening laboratory values were ordered for the patient including screening of hyperlipidemia,  renal function and hepatic function.  Medication reconciliation,  past medical history, social history, problem list and allergies were reviewed in detail with the patient  Goals were established with regard to weight loss, exercise, and  diet in compliance with medications Wt Readings from Last 3 Encounters:  04/28/22 179 lb (81.2 kg)  01/31/22 179 lb 3.2 oz (81.3 kg)  12/07/21 183 lb (83 kg)    Review of Systems  Constitutional: Negative.   HENT:  Positive for rhinorrhea, sinus pressure and sinus pain. Negative for ear discharge, ear pain, sneezing, sore throat and trouble swallowing.   Eyes: Negative.   Respiratory: Negative.    Cardiovascular: Negative.   Gastrointestinal: Negative.   Endocrine: Negative.   Genitourinary: Negative.   Musculoskeletal: Negative.   Skin: Negative.   Allergic/Immunologic: Negative.   Neurological: Negative.   Hematological: Negative.   Psychiatric/Behavioral: Negative.     Past Medical History:  Diagnosis Date   ADHD    Allergy    Anemia    past hx    Anxiety    Barrett esophagus    Breast nodule    benign   Cervical arthritis 09/22/2013   Depression    Elevated liver enzymes    Fatty liver    Fibromyalgia    Fibromyalgia    GERD (gastroesophageal reflux disease)    H/O hiatal hernia    Headache(784.0)    migraines   High cholesterol    History of UTI    Lymphocytic colitis    Migraines    Pain    Panic attack    Paraesophageal hiatal hernia    repaired 2009   Spinal stenosis  Thyroid disease    Thyroid disease     Social History   Socioeconomic History   Marital status: Married    Spouse name: Holiday representative   Number of children: Not on file   Years of education: Not on file   Highest education level: Not on file  Occupational History   Occupation: disabled  Tobacco Use   Smoking status: Never   Smokeless tobacco: Never  Vaping Use   Vaping Use: Never used  Substance and Sexual Activity   Alcohol use: Not Currently     Comment: wine occassionally   Drug use: No   Sexual activity: Yes    Birth control/protection: None    Comment: n/a partial hysterectomy  Other Topics Concern   Not on file  Social History Narrative   Married    Lived in [place].  Moved to [place] in early 2000s   She is on disability for anxiety    Social Determinants of Health   Financial Resource Strain: Low Risk  (12/21/2021)   Overall Financial Resource Strain (CARDIA)    Difficulty of Paying Living Expenses: Not hard at all  Food Insecurity: No Food Insecurity (12/07/2021)   Hunger Vital Sign    Worried About Running Out of Food in the Last Year: Never true    Ran Out of Food in the Last Year: Never true  Transportation Needs: No Transportation Needs (12/21/2021)   PRAPARE - Hydrologist (Medical): No    Lack of Transportation (Non-Medical): No  Physical Activity: Inactive (12/07/2021)   Exercise Vital Sign    Days of Exercise per Week: 0 days    Minutes of Exercise per Session: 0 min  Stress: Stress Concern Present (12/07/2021)   [place]    Feeling of Stress : Rather much  Social Connections: Moderately Isolated (12/07/2021)   Social Connection and Isolation Panel [NHANES]    Frequency of Communication with Friends and Family: More than three times a week    Frequency of Social Gatherings with Friends and Family: More than three times a week    Attends Religious Services: Never    Marine scientist or Organizations: No    Attends Archivist Meetings: Never    Marital Status: Married  Human resources officer Violence: Not At Risk (12/07/2021)   Humiliation, Afraid, Rape, and Kick questionnaire    Fear of Current or Ex-Partner: No    Emotionally Abused: No    Physically Abused: No    Sexually Abused: No    Past Surgical History:  Procedure Laterality Date   ANKLE SURGERY     CHOLECYSTECTOMY  1980s?    COLONOSCOPY  2010   Buccini   EUS N/A 11/19/2013   Procedure: ESOPHAGEAL ENDOSCOPIC ULTRASOUND (EUS) RADIAL;  Surgeon: Arta Silence, MD;  Location: [facility];  Service: Endoscopy;  Laterality: N/A;   LAPAROSCOPIC NISSEN FUNDOPLICATION  81/44/8185   LAPAROSCOPIC PARAESOPHAGEAL HERNIA REPAIR  03/05/2008   Dr Johney Maine   TUBAL LIGATION  1990s?   UPPER GASTROINTESTINAL ENDOSCOPY  04/05/2018   Scarlette Shorts    VAGINAL HYSTERECTOMY  03/04/2001    Family History  Problem Relation Age of Onset   Alcohol abuse Other    Elevated Lipids Other    Heart disease Other    Anxiety disorder Mother    Obesity Mother    High Cholesterol Mother    Thyroid cancer Mother    Depression Mother  Breast cancer Mother    Bone cancer Mother    High Cholesterol Father    Heart disease Father    Alcoholism Father    Colon cancer Maternal Aunt    Colon polyps Sister    Stomach cancer Neg Hx    Esophageal cancer Neg Hx    Rectal cancer Neg Hx     Allergies  Allergen Reactions   Codeine Nausea And Vomiting   Compazine [Prochlorperazine Edisylate]     "feels weird"   Sulfa Antibiotics     Unknown reaction.    Current Outpatient Medications on File Prior to Visit  Medication Sig Dispense Refill   ALPRAZolam (XANAX) 1 MG tablet Take one tablet five times daily for anxiety and panic attacks. 150 tablet 2   atorvastatin (LIPITOR) 40 MG tablet TAKE 1 TABLET BY MOUTH DAILY 90 tablet 0   levothyroxine (SYNTHROID) 25 MCG tablet Take 1 tablet (25 mcg total) by mouth daily before breakfast. 90 tablet 0   meclizine (ANTIVERT) 25 MG tablet TAKE 1 TABLET BY MOUTH THREE TIMES DAILY AS NEEDED FOR DIZZINESS 30 tablet 0   Multiple Vitamin (MULTIVITAMIN ADULT PO) Take 1 tablet by mouth daily.     omega-3 acid ethyl esters (LOVAZA) 1 G capsule Take 1 g by mouth 2 (two) times daily.     omeprazole (PRILOSEC) 20 MG capsule Take 1 capsule (20 mg total) by mouth daily. 90 capsule 0   OVER THE COUNTER MEDICATION Take 1  tablet by mouth daily. Fiber Well     pregabalin (LYRICA) 75 MG capsule Take 1 capsule (75 mg total) by mouth 2 (two) times daily. 60 capsule 2   QUEtiapine (SEROQUEL) 400 MG tablet Take 2 tablets (800 mg total) by mouth at bedtime. 180 tablet 1   Semaglutide,0.25 or 0.'5MG'$ /DOS, (OZEMPIC, 0.25 OR 0.5 MG/DOSE,) 2 MG/3ML SOPN INJECT 0.5 MG SUBCUTANEOUSLY ONCE A WEEK 3 mL 0   venlafaxine XR (EFFEXOR-XR) 75 MG 24 hr capsule TAKE 3 CAPSULES EVERY DAY 270 capsule 1   No current facility-administered medications on file prior to visit.    BP 98/70   Pulse 83   Temp 98 F (36.7 C) (Oral)   Ht '5\' 4"'$  (1.626 m)   Wt 179 lb (81.2 kg)   SpO2 96%   BMI 30.73 kg/m       Objective:   Physical Exam Vitals and nursing note reviewed.  Constitutional:      General: She is not in acute distress.    Appearance: Normal appearance. She is well-developed. She is not ill-appearing.  HENT:     Head: Normocephalic and atraumatic.     Right Ear: Tympanic membrane, ear canal and external ear normal. There is no impacted cerumen.     Left Ear: Tympanic membrane, ear canal and external ear normal. There is no impacted cerumen.     Nose: Congestion and rhinorrhea present. Rhinorrhea is purulent.     Right Turbinates: Enlarged and swollen.     Left Turbinates: Enlarged and swollen.     Right Sinus: Maxillary sinus tenderness and frontal sinus tenderness present.     Left Sinus: Maxillary sinus tenderness and frontal sinus tenderness present.     Mouth/Throat:     Mouth: Mucous membranes are moist.     Pharynx: Oropharynx is clear. No oropharyngeal exudate or posterior oropharyngeal erythema.  Eyes:     General:        Right eye: No discharge.  Left eye: No discharge.     Extraocular Movements: Extraocular movements intact.     Conjunctiva/sclera: Conjunctivae normal.     Pupils: Pupils are equal, round, and reactive to light.  Neck:     Thyroid: No thyromegaly.     Vascular: No carotid bruit.      Trachea: No tracheal deviation.  Cardiovascular:     Rate and Rhythm: Normal rate and regular rhythm.     Pulses: Normal pulses.     Heart sounds: Normal heart sounds. No murmur heard.    No friction rub. No gallop.  Pulmonary:     Effort: Pulmonary effort is normal. No respiratory distress.     Breath sounds: Normal breath sounds. No stridor. No wheezing, rhonchi or rales.  Chest:     Chest wall: No tenderness.  Abdominal:     General: Abdomen is flat. Bowel sounds are normal. There is no distension.     Palpations: Abdomen is soft. There is no mass.     Tenderness: There is no abdominal tenderness. There is no right CVA tenderness, left CVA tenderness, guarding or rebound.     Hernia: No hernia is present.  Musculoskeletal:        General: No swelling, tenderness, deformity or signs of injury. Normal range of motion.     Cervical back: Normal range of motion and neck supple.     Right lower leg: No edema.     Left lower leg: No edema.  Lymphadenopathy:     Cervical: No cervical adenopathy.  Skin:    General: Skin is warm and dry.     Coloration: Skin is not jaundiced or pale.     Findings: No bruising, erythema, lesion or rash.  Neurological:     General: No focal deficit present.     Mental Status: She is alert and oriented to person, place, and time.     Cranial Nerves: No cranial nerve deficit.     Sensory: No sensory deficit.     Motor: No weakness.     Coordination: Coordination normal.     Gait: Gait normal.     Deep Tendon Reflexes: Reflexes normal.  Psychiatric:        Mood and Affect: Mood normal.        Behavior: Behavior normal.        Thought Content: Thought content normal.        Judgment: Judgment normal.       Assessment & Plan:  1. Routine general medical examination at a health care facility Today patient counseled on age appropriate routine health concerns for screening and prevention, each reviewed and up to date or declined. Immunizations reviewed  and up to date or declined. Labs ordered and reviewed. Risk factors for depression reviewed and negative. Hearing function and visual acuity are intact. ADLs screened and addressed as needed. Functional ability and level of safety reviewed and appropriate. Education, counseling and referrals performed based on assessed risks today. Patient provided with a copy of personalized plan for preventive services.   2. Prediabetes - Continue with Ozempic  - Follow up in 3 months  - CBC with Differential/Platelet; Future - Comprehensive metabolic panel; Future - Hemoglobin A1c; Future - Lipid panel; Future - TSH; Future - TSH - Lipid panel - Hemoglobin A1c - Comprehensive metabolic panel - CBC with Differential/Platelet  3. Fibromyalgia - Continue Lyrica  - CBC with Differential/Platelet; Future - Comprehensive metabolic panel; Future  - Lipid panel; Future - TSH; Future  4. Anxiety and depression - Per psychiatry   5. Hyperlipidemia  - Consider increase in statin  - CBC with Differential/Platelet; Future - Comprehensive metabolic panel; Future  - Lipid panel; Future - TSH; Future  6. Other specified hypothyroidism - Consider increase in synthroid  - CBC with Differential/Platelet; Future - Comprehensive metabolic panel; Future - Lipid panel; Future - TSH; Future  7. Acute non-recurrent pansinusitis - Will treat due to symptoms and duration  - doxycycline (VIBRAMYCIN) 100 MG capsule; Take 1 capsule (100 mg total) by mouth 2 (two) times daily.  Dispense: 14 capsule; Refill: 0  [name], NP

## 2022-04-28 NOTE — Patient Instructions (Signed)
It was great seeing you today   We will follow up with you regarding your lab work   Please let me know if you need anything   

## 2022-05-02 ENCOUNTER — Telehealth: Payer: Self-pay | Admitting: Pharmacist

## 2022-05-02 NOTE — Progress Notes (Unsigned)
Care Coordination Pharmacy Assistant   Patient ID: Leslie Benson, female   DOB: 27-Mar-1961, 62 y.o.   MRN: [id number]  Reason for Encounter: Disease State   Conditions to be addressed/monitored: General Assessment per MP  Recent office visits:  04/28/22 [name], NP - Patient presented for routine general medical examination at a health care facility and other concerns. Prescribed Doxycycline. Increased Ozempic.   Recent consult visits:  None  Hospital visits:  None in previous 6 months   Medications: Outpatient Encounter Medications as of 05/02/2022  Medication Sig   ALPRAZolam (XANAX) 1 MG tablet Take one tablet five times daily for anxiety and panic attacks.   atorvastatin (LIPITOR) 40 MG tablet TAKE 1 TABLET BY MOUTH DAILY   doxycycline (VIBRAMYCIN) 100 MG capsule Take 1 capsule (100 mg total) by mouth 2 (two) times daily.   levothyroxine (SYNTHROID) 25 MCG tablet Take 1 tablet (25 mcg total) by mouth daily before breakfast.   meclizine (ANTIVERT) 25 MG tablet TAKE 1 TABLET BY MOUTH THREE TIMES DAILY AS NEEDED FOR DIZZINESS   Multiple Vitamin (MULTIVITAMIN ADULT PO) Take 1 tablet by mouth daily.   omega-3 acid ethyl esters (LOVAZA) 1 G capsule Take 1 g by mouth 2 (two) times daily.   omeprazole (PRILOSEC) 20 MG capsule Take 1 capsule (20 mg total) by mouth daily.   OVER THE COUNTER MEDICATION Take 1 tablet by mouth daily. Fiber Well   pregabalin (LYRICA) 75 MG capsule Take 1 capsule (75 mg total) by mouth 2 (two) times daily.   QUEtiapine (SEROQUEL) 400 MG tablet Take 2 tablets (800 mg total) by mouth at bedtime.   Semaglutide,0.25 or 0.'5MG'$ /DOS, (OZEMPIC, 0.25 OR 0.5 MG/DOSE,) 2 MG/3ML SOPN INJECT 0.5 MG SUBCUTANEOUSLY ONCE A WEEK   venlafaxine XR (EFFEXOR-XR) 75 MG 24 hr capsule TAKE 3 CAPSULES EVERY DAY   No facility-administered encounter medications on file as of 05/02/2022.  [place] for General Review Call  Adherence Review:  Does the Clinical  Pharmacist Assistant have access to adherence rates? Yes Adherence rates for STAR metric medications Atorvastatin (Lipitor) 40 mg - Last filled 04/25/22 90 DS at [facility] Atorvastatin (Lipitor) 40 mg - Last filled 03/02/22 90 DS at [place] filled 04/25/22 28 DS at [place] filled 04/02/22 28 DS at [facility]  Does the patient have >5 day gap between last estimated fill dates for any of the above medications or other medication gaps? No   Disease State Questions:  Able to connect with Patient? {yes/no:20286} Did patient have any problems with their health recently? {yes/no:20286} Note problems and Concerns: Have you had any admissions or emergency room visits or worsening of your condition(s) since last visit? {yes/no:20286} Details of ED visit, hospital visit and/or worsening condition(s): Have you had any visits with new specialists or providers since your last visit? {yes/no:20286} Explain: Have you had any new health care problem(s) since your last visit? {yes/no:20286} New problem(s) reported: Have you run out of any of your medications since you last spoke with clinical pharmacist? {yes/no:20286} What caused you to run out of your medications? Are there any medications you are not taking as prescribed? {yes/no:20286} What kept you from taking your medications as prescribed? Are you having any issues or side effects with your medications? {yes/no:20286} Note of issues or side effects: Do you have any other health concerns or questions you want to discuss with your Clinical Pharmacist before your next visit? {yes/no:20286} Note additional concerns and  questions from Patient. Are there any health concerns that you feel we can do a better job addressing? {yes/no:20286} Note Patient's response. Are you having any problems with any of the following since the last visit: (select all that apply)  {General Call:27390}  Details: 12. Any falls  since last visit? {yes/no:20286}  Details: 13. Any increased or uncontrolled pain since last visit? {yes/no:20286}  Details: 14. Next visit Type: {Telephone/Office:25179}       Visit with:        Date:        Time:  97. Additional Details? {yes/no:20286}    Care Gaps: Flu Vaccine - Overdue COVID Booster - Postponed Diabetic Urine - Postponed  Star Rating Drugs: Atorvastatin (Lipitor) 40 mg - Last filled 04/25/22 90 DS at [place] filled 04/25/22 28 DS at [place] Pharmacist Assistant [phone]

## 2022-05-04 ENCOUNTER — Other Ambulatory Visit: Payer: Self-pay | Admitting: Adult Health

## 2022-05-04 DIAGNOSIS — F410 Panic disorder [episodic paroxysmal anxiety] without agoraphobia: Secondary | ICD-10-CM

## 2022-05-04 DIAGNOSIS — G4700 Insomnia, unspecified: Secondary | ICD-10-CM

## 2022-05-12 ENCOUNTER — Other Ambulatory Visit: Payer: Self-pay

## 2022-05-12 ENCOUNTER — Emergency Department (HOSPITAL_COMMUNITY): Payer: Medicare HMO

## 2022-05-12 ENCOUNTER — Emergency Department (HOSPITAL_COMMUNITY)
Admission: EM | Admit: 2022-05-12 | Discharge: 2022-05-13 | Disposition: A | Discharge status: Home / Self Care | Payer: Medicare HMO | Attending: Emergency Medicine | Admitting: Emergency Medicine

## 2022-05-12 ENCOUNTER — Encounter (HOSPITAL_COMMUNITY): Payer: Self-pay

## 2022-05-12 DIAGNOSIS — I4510 Unspecified right bundle-branch block: Secondary | ICD-10-CM | POA: Insufficient documentation

## 2022-05-12 DIAGNOSIS — R531 Weakness: Secondary | ICD-10-CM | POA: Diagnosis not present

## 2022-05-12 DIAGNOSIS — R42 Dizziness and giddiness: Secondary | ICD-10-CM | POA: Diagnosis not present

## 2022-05-12 LAB — DIFFERENTIAL
Abs Immature Granulocytes: 0.01 10*3/uL (ref 0.00–0.07)
Basophils Absolute: 0 10*3/uL (ref 0.0–0.1)
Basophils Relative: 1 %
Eosinophils Absolute: 0.1 10*3/uL (ref 0.0–0.5)
Eosinophils Relative: 2 %
Immature Granulocytes: 0 %
Lymphocytes Relative: 42 %
Lymphs Abs: 2.4 10*3/uL (ref 0.7–4.0)
Monocytes Absolute: 0.5 10*3/uL (ref 0.1–1.0)
Monocytes Relative: 8 %
Neutro Abs: 2.7 10*3/uL (ref 1.7–7.7)
Neutrophils Relative %: 47 %

## 2022-05-12 LAB — PROTIME-INR
INR: 1 (ref 0.8–1.2)
Prothrombin Time: 12.8 seconds (ref 11.4–15.2)

## 2022-05-12 LAB — COMPREHENSIVE METABOLIC PANEL
ALT: 46 U/L — ABNORMAL HIGH (ref 0–44)
AST: 38 U/L (ref 15–41)
Albumin: 4.1 g/dL (ref 3.5–5.0)
Alkaline Phosphatase: 100 U/L (ref 38–126)
Anion gap: 8 (ref 5–15)
BUN: 11 mg/dL (ref 8–23)
CO2: 27 mmol/L (ref 22–32)
Calcium: 8.7 mg/dL — ABNORMAL LOW (ref 8.9–10.3)
Chloride: 101 mmol/L (ref 98–111)
Creatinine, Ser: 0.93 mg/dL (ref 0.44–1.00)
GFR, Estimated: >60 mL/min (ref >60)
Glucose, Bld: 93 mg/dL (ref 70–99)
Potassium: 3.7 mmol/L (ref 3.5–5.1)
Sodium: 136 mmol/L (ref 135–145)
Total Bilirubin: 0.3 mg/dL (ref 0.3–1.2)
Total Protein: 7 g/dL (ref 6.5–8.1)

## 2022-05-12 LAB — APTT: aPTT: 31 seconds (ref 24–36)

## 2022-05-12 LAB — ETHANOL: Alcohol, Ethyl (B): <10 mg/dL (ref <10)

## 2022-05-12 LAB — CBC
HCT: 37.9 % (ref 36.0–46.0)
Hemoglobin: 12.2 g/dL (ref 12.0–15.0)
MCH: 31 pg (ref 26.0–34.0)
MCHC: 32.2 g/dL (ref 30.0–36.0)
MCV: 96.2 fL (ref 80.0–100.0)
Platelets: 196 10*3/uL (ref 150–400)
RBC: 3.94 MIL/uL (ref 3.87–5.11)
RDW: 13.2 % (ref 11.5–15.5)
WBC: 5.8 10*3/uL (ref 4.0–10.5)
nRBC: 0 % (ref 0.0–0.2)

## 2022-05-12 LAB — CBG MONITORING, ED: Glucose-Capillary: 88 mg/dL (ref 70–99)

## 2022-05-12 MED ORDER — MECLIZINE HCL 25 MG PO TABS
25.0000 mg | ORAL_TABLET | Freq: Once | ORAL | Status: AC
  Administered 2022-05-12: 25 mg via ORAL
  Filled 2022-05-12: qty 1

## 2022-05-12 MED ORDER — SODIUM CHLORIDE 0.9% FLUSH
3.0000 mL | Freq: Once | INTRAVENOUS | Status: AC
  Administered 2022-05-12: 3 mL via INTRAVENOUS

## 2022-05-12 NOTE — ED Provider Triage Note (Signed)
Emergency Medicine Provider Triage Evaluation Note  Leslie Benson , a 62 y.o. female  was evaluated in triage.  Pt complains of feeling weird, dizziness, lightheadedness for the last 2 to 3 days, endorses previous history of dizziness but "feels weird".  Patient cannot be more specific than that, she denies any unilateral numbness, tingling, dysarthria, dysphagia, chest pain, shortness of breath.  Review of Systems  Positive: Dizziness, weakness Negative: Fever, chills  Physical Exam  There were no vitals taken for this visit. Gen:   Awake, no distress   Resp:  Normal effort  MSK:   Moves extremities without difficulty  Other:  Cranial nerves II through XII grossly intact.  Intact finger-nose, intact heel-to-shin.  Patient with gross dizziness on Romberg, inability to stand without assistance.  Alert and oriented x3.  Moves all 4 limbs spontaneously, normal coordination.  No pronator drift.  Intact strength 5 out of 5 bilateral upper and lower extremities.   Medical Decision Making  Medically screening exam initiated at 8:10 PM.  Appropriate orders placed.  [name] was informed that the remainder of the evaluation will be completed by another provider, this initial triage assessment does not replace that evaluation, and the importance of remaining in the ED until their evaluation is complete.  Workup initiated in triage    Dorien Chihuahua 05/12/22 2011

## 2022-05-12 NOTE — ED Provider Notes (Signed)
[facility] Emergency Department Provider Note MRN:  [id number]  Arrival date & time: 05/13/22     Chief Complaint   Dizziness  History of Present Illness   Leslie Benson is a 62 y.o. year-old female presents to the ED with chief complaint of dizziness.  She states that she has been feeling dizzy for the past several days.  She states that she cannot balance.  She states that she fell on Wednesday when she was trying to walk.  She states that she feels weak in her legs.  She denies vision changes, slurred speech.  She states that she has had vertigo before, but it doesn't feel like that. History provided by patient.   Review of Systems  Pertinent positive and negative review of systems noted in HPI.    Physical Exam   Vitals:   05/12/22 2356 05/13/22 0045  BP:  107/76  Pulse:  72  Resp:  12  Temp: 98 F (36.7 C)   SpO2:  97%    CONSTITUTIONAL:  non toxic-appearing, NAD NEURO:  Alert and oriented x 3, CN 3-12 grossly intact, failed finger-nose-finger, normal strength in upper and lower extremities EYES:  eyes equal and reactive ENT/NECK:  Supple, no stridor  CARDIO:  normal rate, regular rhythm, appears well-perfused  PULM:  No respiratory distress,  GI/GU:  non-distended,  MSK/SPINE:  No gross deformities, no edema, moves all extremities  SKIN:  no rash, atraumatic   *Additional and/or pertinent findings included in MDM below  Diagnostic and Interventional Summary    EKG Interpretation  Date/Time:  Friday May 12 2022 20:18:11 EST Ventricular Rate:  95 PR Interval:  159 QRS Duration: 114 QT Interval:  362 QTC Calculation: 456 R Axis:   63 Text Interpretation: Sinus rhythm Incomplete right bundle branch block No significant change since last tracing Confirmed by Wandra Arthurs [phone]) on 05/12/2022 10:09:32 PM       Labs Reviewed  COMPREHENSIVE METABOLIC PANEL - Abnormal; Notable for the following components:      Result Value    Calcium 8.7 (*)    ALT 46 (*)    All other components within normal limits  PROTIME-INR  APTT  CBC  DIFFERENTIAL  ETHANOL  CBG MONITORING, ED  I-STAT CHEM 8, ED    MR BRAIN WO CONTRAST  Final Result    CT HEAD WO CONTRAST  Final Result      Medications  sodium chloride flush (NS) 0.9 % injection 3 mL (3 mLs Intravenous Given 05/12/22 2214)  meclizine (ANTIVERT) tablet 25 mg (25 mg Oral Given 05/12/22 2317)     Procedures  /  Critical Care Procedures  ED Course and Medical Decision Making  I have reviewed the triage vital signs, the nursing notes, and pertinent available records from the EMR.  Social Determinants Affecting Complexity of Care: Patient has no clinically significant social determinants affecting this chief complaint..   ED Course: Clinical Course as of 05/13/22 0133  Sat May 13, 2022  0131 Patient ambulates, she was a bit unsteady, but not significantly so.  MRI is normal.  Labs are reassuring.  Hx of vertigo.  This is likely recurrence.  She states that she feels better after having had the meclizine.  She feels improved now when compared to arrival.  Will discharge home with PCP follow-up.  Rx for meclizine. [RB]    Clinical Course User Index [RB] Montine Circle, PA-C    Medical Decision Making Patient with new persistent  dizziness for 3 days.  States that she can't balance or walk.  Feels weak in her legs.  CT ordered in triage is negative, will need MRI.    Labs pending.  Will give dose of Meclizine.  Amount and/or Complexity of Data Reviewed Radiology: ordered and independent interpretation performed.    Details: No obvious stroke on MRI  Risk Prescription drug management. Decision regarding hospitalization.     Consultants: No consultations were needed in caring for this patient.   Treatment and Plan: I considered admission due to patient's initial presentation, but after considering the examination and diagnostic results, patient  will not require admission and can be discharged with outpatient follow-up.    Final Clinical Impressions(s) / ED Diagnoses     ICD-10-CM   1. Dizziness  R42       ED Discharge Orders          Ordered    meclizine (ANTIVERT) 25 MG tablet  3 times daily PRN        05/13/22 0117              Discharge Instructions Discussed with and Provided to Patient:     Discharge Instructions      Your MRI showed no evidence of stroke. Your lab tests all looked good.  Please take medications as directed.  Please follow-up with your doctor.       Montine Circle, PA-C 05/13/22 0134    Drenda Freeze, MD 05/13/22 [phone]

## 2022-05-12 NOTE — ED Triage Notes (Signed)
Pt has had dizziness for several das including several falls. Pt states that she doesn't feel right.

## 2022-05-13 MED ORDER — MECLIZINE HCL 25 MG PO TABS: 25.0000 mg | ORAL_TABLET | Freq: Three times a day (TID) | ORAL | 0 refills | Status: DC | PRN

## 2022-05-13 NOTE — Discharge Instructions (Addendum)
Your MRI showed no evidence of stroke. Your lab tests all looked good.  Please take medications as directed.  Please follow-up with your doctor.

## 2022-05-13 NOTE — ED Notes (Signed)
Pt able to ambulate with one assist.

## 2022-05-17 ENCOUNTER — Telehealth: Payer: Self-pay

## 2022-05-17 NOTE — Telephone Encounter (Signed)
        Patient  visited [place] on 1/20     Telephone encounter attempt :  1st  Left a message to call back at the earliest [place], [facility]  [phone] [address], [place], [place] [address] Phone: [phone] Email: [email or web address]

## 2022-05-18 ENCOUNTER — Telehealth: Payer: Self-pay

## 2022-05-18 ENCOUNTER — Ambulatory Visit: Payer: Medicare HMO | Admitting: Physician Assistant

## 2022-05-18 NOTE — Telephone Encounter (Signed)
     Patient  visit on 1/20  at [place] you been able to follow up with your primary care physician? NA   The patient was or was not able to obtain any needed medicine or equipment. NA   Are there diet recommendations that you are having difficulty following? NA   Patient expresses understanding of discharge instructions and education provided has no other needs at this time.  NA      [place], [facility], Care Management  [phone] [address], [place], [place] [address] Phone: [phone] Email: [email or web address]

## 2022-05-19 ENCOUNTER — Other Ambulatory Visit: Payer: Self-pay | Admitting: Adult Health

## 2022-05-19 DIAGNOSIS — E118 Type 2 diabetes mellitus with unspecified complications: Secondary | ICD-10-CM

## 2022-05-22 ENCOUNTER — Telehealth: Payer: Self-pay | Admitting: Adult Health

## 2022-05-22 NOTE — Telephone Encounter (Signed)
Pt lvm that she got a letter from [place] telling her that they will only cover 120 pills verus 150 of her xanax. She needs a PA done  for the quanitity.

## 2022-05-25 NOTE — Telephone Encounter (Signed)
Prior Approval received for Alprazolam 1 mg #150/30 per [facility] Medicare effective 04/24/2022-04/24/2023 pa# [id number]

## 2022-05-25 NOTE — Telephone Encounter (Signed)
Noted. Will submit PA

## 2022-05-26 NOTE — Telephone Encounter (Signed)
Patient notified

## 2022-06-07 ENCOUNTER — Encounter: Payer: Self-pay | Admitting: Adult Health

## 2022-06-07 NOTE — Telephone Encounter (Signed)
error 

## 2022-06-09 ENCOUNTER — Ambulatory Visit (INDEPENDENT_AMBULATORY_CARE_PROVIDER_SITE_OTHER): Payer: Medicare HMO | Admitting: Physician Assistant

## 2022-06-09 ENCOUNTER — Encounter: Payer: Self-pay | Admitting: Physician Assistant

## 2022-06-09 VITALS — BP 100/66 | HR 84 | Ht 64.0 in | Wt 185.1 lb

## 2022-06-09 DIAGNOSIS — K5900 Constipation, unspecified: Secondary | ICD-10-CM

## 2022-06-09 DIAGNOSIS — R1310 Dysphagia, unspecified: Secondary | ICD-10-CM

## 2022-06-09 DIAGNOSIS — K219 Gastro-esophageal reflux disease without esophagitis: Secondary | ICD-10-CM

## 2022-06-09 MED ORDER — OMEPRAZOLE 20 MG PO CPDR: 20.0000 mg | DELAYED_RELEASE_CAPSULE | Freq: Every day | ORAL | 3 refills | Status: DC

## 2022-06-09 NOTE — Progress Notes (Signed)
Chief Complaint: Abdominal pain, bloating and constipation as well as follow-up of reflux  HPI:    Leslie Benson is a 62 year old female with a past medical history as listed below including anxiety and GERD, known to Dr. Henrene Pastor, who presents to clinic today with a new complaint of abdominal pain, bloating and constipation as well as for follow-up of her reflux.    04/13/2019 colonoscopy with two 2-3 mm polyps in the rectum and distal transverse colon.  Pathology showed tubular adenomas and repeat recommended in 7 years.    01/22/2020 EGD was normal.  Esophagus dilated empirically, stomach status post intact fundoplication.    04/28/2022 hemoglobin A1c normal.    05/12/2022 CMP with a minimally elevated ALT at 46, normal CBC and differential.    Today, the patient presents to clinic and explains that just over the past couple of weeks she has noticed that her medicines are getting stuck again in her throat.  This is similar to when she had her last EGD and had her esophagus dilated which helped her immensely, in fact she has not had any trouble over the past 3 years.  Does tell me she needs a refill of her Omeprazole which she thinks she is taking 20 mg in the morning.  Has no breakthrough reflux symptoms.    Patient did start Ozempic about a year ago and over the past 6 months or so has noticed a change to constipation.  Just recently she has started Dulcolax 1-2 tabs a day and this works really well for her.  She was having associated left-sided abdominal pain and gas/bloating.  All of this seems to be getting a little bit better over the past few days and she has been using Dulcolax.    Denies fever, chills, weight loss or blood in her stool.  Past Medical History:  Diagnosis Date   ADHD    Allergy    Anemia    past hx    Anxiety    Barrett esophagus    Breast nodule    benign   Cervical arthritis 09/22/2013   Depression    Elevated liver enzymes    Fatty liver    Fibromyalgia     Fibromyalgia    GERD (gastroesophageal reflux disease)    H/O hiatal hernia    Headache(784.0)    migraines   High cholesterol    History of UTI    Lymphocytic colitis    Migraines    Pain    Panic attack    Paraesophageal hiatal hernia    repaired 2009   Spinal stenosis    Thyroid disease    Thyroid disease    Vertigo     Past Surgical History:  Procedure Laterality Date   ANKLE SURGERY     CHOLECYSTECTOMY  1980s?   COLONOSCOPY  2010   Buccini   EUS N/A 11/19/2013   Procedure: ESOPHAGEAL ENDOSCOPIC ULTRASOUND (EUS) RADIAL;  Surgeon: Arta Silence, MD;  Location: [facility];  Service: Endoscopy;  Laterality: N/A;   LAPAROSCOPIC NISSEN FUNDOPLICATION  0000000   LAPAROSCOPIC PARAESOPHAGEAL HERNIA REPAIR  03/05/2008   Dr Johney Maine   TUBAL LIGATION  1990s?   UPPER GASTROINTESTINAL ENDOSCOPY  04/05/2018   Scarlette Shorts    VAGINAL HYSTERECTOMY  03/04/2001    Current Outpatient Medications  Medication Sig Dispense Refill   ALPRAZolam (XANAX) 1 MG tablet TAKE 1 TABLET BY MOUTH FIVE TIMES DAILY FOR ANXIETY AND  PANIC  ATTACKS 150 tablet 1   atorvastatin (  LIPITOR) 40 MG tablet TAKE 1 TABLET BY MOUTH DAILY 90 tablet 0   levothyroxine (SYNTHROID) 25 MCG tablet Take 1 tablet (25 mcg total) by mouth daily before breakfast. 90 tablet 0   meclizine (ANTIVERT) 25 MG tablet Take 1 tablet (25 mg total) by mouth 3 (three) times daily as needed for dizziness. 30 tablet 0   Multiple Vitamin (MULTIVITAMIN ADULT PO) Take 1 tablet by mouth daily.     omega-3 acid ethyl esters (LOVAZA) 1 G capsule Take 1 g by mouth 2 (two) times daily.     omeprazole (PRILOSEC) 20 MG capsule Take 1 capsule (20 mg total) by mouth daily. 90 capsule 0   OVER THE COUNTER MEDICATION Take 1 tablet by mouth daily. Fiber Well     pregabalin (LYRICA) 75 MG capsule Take 1 capsule (75 mg total) by mouth 2 (two) times daily. 60 capsule 2   QUEtiapine (SEROQUEL) 400 MG tablet Take 2 tablets (800 mg total) by mouth at bedtime.  180 tablet 1   Semaglutide,0.25 or 0.5MG/DOS, (OZEMPIC, 0.25 OR 0.5 MG/DOSE,) 2 MG/3ML SOPN INJECT 0.5 MG UNDER THE SKIN ONCE A WEEK 3 mL 3   venlafaxine XR (EFFEXOR-XR) 75 MG 24 hr capsule TAKE 3 CAPSULES EVERY DAY 270 capsule 1   No current facility-administered medications for this visit.    Allergies as of 06/09/2022 - Review Complete 06/09/2022  Allergen Reaction Noted   Codeine Nausea And Vomiting 06/20/2012   Compazine [prochlorperazine edisylate]  08/19/2014   Sulfa antibiotics  06/20/2012    Family History  Problem Relation Age of Onset   Alcohol abuse Other    Elevated Lipids Other    Heart disease Other    Anxiety disorder Mother    Obesity Mother    High Cholesterol Mother    Thyroid cancer Mother    Depression Mother    Breast cancer Mother    Bone cancer Mother    High Cholesterol Father    Heart disease Father    Alcoholism Father    Colon cancer Maternal Aunt    Colon polyps Sister    Stomach cancer Neg Hx    Esophageal cancer Neg Hx    Rectal cancer Neg Hx     Social History   Socioeconomic History   Marital status: Married    Spouse name: Holiday representative   Number of children: 3   Years of education: Not on file   Highest education level: Not on file  Occupational History   Occupation: disabled  Tobacco Use   Smoking status: Never   Smokeless tobacco: Never  Vaping Use   Vaping Use: Never used  Substance and Sexual Activity   Alcohol use: Not Currently    Comment: wine occassionally   Drug use: No   Sexual activity: Yes    Birth control/protection: None    Comment: n/a partial hysterectomy  Other Topics Concern   Not on file  Social History Narrative   Married    Lived in [place].  Moved to [place] in early 2000s   She is on disability for anxiety    Social Determinants of Health   Financial Resource Strain: Low Risk  (12/21/2021)   Overall Financial Resource Strain (CARDIA)    Difficulty of Paying Living Expenses: Not hard at all  Food  Insecurity: No Food Insecurity (12/07/2021)   Hunger Vital Sign    Worried About Running Out of Food in the Last Year: Never true    Ran Out of Food in  the Last Year: Never true  Transportation Needs: No Transportation Needs (12/21/2021)   PRAPARE - Hydrologist (Medical): No    Lack of Transportation (Non-Medical): No  Physical Activity: Inactive (12/07/2021)   Exercise Vital Sign    Days of Exercise per Week: 0 days    Minutes of Exercise per Session: 0 min  Stress: Stress Concern Present (12/07/2021)   [place]    Feeling of Stress : Rather much  Social Connections: Moderately Isolated (12/07/2021)   Social Connection and Isolation Panel [NHANES]    Frequency of Communication with Friends and Family: More than three times a week    Frequency of Social Gatherings with Friends and Family: More than three times a week    Attends Religious Services: Never    Marine scientist or Organizations: No    Attends Archivist Meetings: Never    Marital Status: Married  Human resources officer Violence: Not At Risk (12/07/2021)   Humiliation, Afraid, Rape, and Kick questionnaire    Fear of Current or Ex-Partner: No    Emotionally Abused: No    Physically Abused: No    Sexually Abused: No    Review of Systems:    Constitutional: No weight loss, fever or chills Skin: No rash  Cardiovascular: No chest pain  Respiratory: No SOB  Gastrointestinal: See HPI and otherwise negative Genitourinary: No dysuria  Neurological: No headache, dizziness or syncope Musculoskeletal: No new muscle or joint pain Hematologic: No bleeding  Psychiatric: +depression/anxiety   Physical Exam:  Vital signs: BP 100/66 (BP Location: Left Arm, Patient Position: Sitting, Cuff Size: Normal)   Pulse 84   Ht 5' 4"$  (1.626 m)   Wt 185 lb 2 oz (84 kg)   BMI 31.78 kg/m    Constitutional:   Pleasant overweight  Caucasian female appears to be in NAD, Well developed, Well nourished, alert and cooperative Head:  Normocephalic and atraumatic. Eyes:   PEERL, EOMI. No icterus. Conjunctiva pink. Ears:  Normal auditory acuity. Neck:  Supple Throat: Oral cavity and pharynx without inflammation, swelling or lesion.  Respiratory: Respirations even and unlabored. Lungs clear to auscultation bilaterally.   No wheezes, crackles, or rhonchi.  Cardiovascular: Normal S1, S2. No MRG. Regular rate and rhythm. No peripheral edema, cyanosis or pallor.  Gastrointestinal:  Soft, nondistended, mild epigastric ttp. No rebound or guarding. Normal bowel sounds. No appreciable masses or hepatomegaly. Rectal:  Not performed.  Msk:  Symmetrical without gross deformities. Without edema, no deformity or joint abnormality.  Neurologic:  Alert and  oriented x4;  grossly normal neurologically.  Skin:   Dry and intact without significant lesions or rashes. Psychiatric: Demonstrates good judgement and reason without abnormal affect or behaviors.  RELEVANT LABS AND IMAGING: CBC    Component Value Date/Time   WBC 5.8 05/12/2022 2211   RBC 3.94 05/12/2022 2211   HGB 12.2 05/12/2022 2211   HCT 37.9 05/12/2022 2211   PLT 196 05/12/2022 2211   MCV 96.2 05/12/2022 2211   MCH 31.0 05/12/2022 2211   MCHC 32.2 05/12/2022 2211   RDW 13.2 05/12/2022 2211   LYMPHSABS 2.4 05/12/2022 2211   MONOABS 0.5 05/12/2022 2211   EOSABS 0.1 05/12/2022 2211   BASOSABS 0.0 05/12/2022 2211    CMP     Component Value Date/Time   NA 136 05/12/2022 2211   NA 141 10/01/2019 1323   K 3.7 05/12/2022 2211   CL 101 05/12/2022  2211   CO2 27 05/12/2022 2211   GLUCOSE 93 05/12/2022 2211   BUN 11 05/12/2022 2211   BUN 10 10/01/2019 1323   CREATININE 0.93 05/12/2022 2211   CALCIUM 8.7 (L) 05/12/2022 2211   PROT 7.0 05/12/2022 2211   PROT 7.2 09/25/2018 1125   ALBUMIN 4.1 05/12/2022 2211   ALBUMIN 4.8 09/25/2018 1125   AST 38 05/12/2022 2211   ALT  46 (H) 05/12/2022 2211   ALKPHOS 100 05/12/2022 2211   BILITOT 0.3 05/12/2022 2211   BILITOT 0.3 09/25/2018 1125   GFRNONAA >60 05/12/2022 2211   GFRAA 67 10/01/2019 1323    Assessment: 1.  Dysphagia: Trouble with this in 2021 with the EGD normal with empiric dilation which helped; consider esophageal stricture versus other 2.  GERD: Controlled on Omeprazole 20 mg daily 3.  Constipation: New for the patient over the past 6 months, did start Ozempic about a year ago, better with Dulcolax; likely medication side effect 4.  History of adenomatous polyps: Last colonoscopy in 2020 with repeat recommended in 7 years   Plan: 1.  Scheduled patient for diagnostic EGD with dilation in the [facility] with Dr. Henrene Pastor.  Did provide the patient a detailed list risks for the procedure and she agrees to proceed. Patient is appropriate for endoscopic procedure(s) in the ambulatory ([facility]) setting.  2.  Refilled Omeprazole 20 mg every morning, 30-60 minutes before breakfast #90 with 3 refills sent to the patient's [facility] per her request. 3.  Continue Dulcolax as needed for constipation as this seems to be working well for her. 4.  Patient to follow in clinic per recommendations after time of procedure.  Ellouise Newer, PA-C [facility] 06/09/2022, 3:07 PM  Cc: [name], NP

## 2022-06-09 NOTE — Patient Instructions (Addendum)
Continue Dulcolax as needed.   We have sent the following medications to your pharmacy for you to pick up at your convenience: Omeprazole ( sent to [facility]).   You have been scheduled for an endoscopy. Please follow written instructions given to you at your visit today. If you use inhalers (even only as needed), please bring them with you on the day of your procedure.  _______________________________________________________  If your blood pressure at your visit was 140/90 or greater, please contact your primary care physician to follow up on this.  _______________________________________________________  If you are age 64 or older, your body mass index should be between 23-30. Your Body mass index is 31.78 kg/m. If this is out of the aforementioned range listed, please consider follow up with your Primary Care Provider.  If you are age 89 or younger, your body mass index should be between 19-25. Your Body mass index is 31.78 kg/m. If this is out of the aformentioned range listed, please consider follow up with your Primary Care Provider.   ________________________________________________________  The [facility] providers would like to encourage you to use [facility] to communicate with providers for non-urgent requests or questions.  Due to long hold times on the telephone, sending your provider a message by [facility] may be a faster and more efficient way to get a response.  Please allow 48 business hours for a response.  Please remember that this is for non-urgent requests.  _______________________________________________________  Thank you for trusting me with your gastrointestinal care!   Ellouise Newer PA-C

## 2022-06-16 NOTE — Progress Notes (Signed)
Assessment and plans noted ?

## 2022-06-19 ENCOUNTER — Ambulatory Visit
Admission: RE | Admit: 2022-06-19 | Discharge: 2022-06-19 | Disposition: A | Discharge status: Home / Self Care | Payer: Medicare HMO | Source: Ambulatory Visit | Attending: Adult Health | Admitting: Adult Health

## 2022-06-19 DIAGNOSIS — Z1231 Encounter for screening mammogram for malignant neoplasm of breast: Secondary | ICD-10-CM

## 2022-06-21 ENCOUNTER — Other Ambulatory Visit: Payer: Self-pay | Admitting: Cardiology

## 2022-06-21 ENCOUNTER — Telehealth: Payer: Self-pay | Admitting: Adult Health

## 2022-06-21 ENCOUNTER — Other Ambulatory Visit: Payer: Self-pay | Admitting: Internal Medicine

## 2022-06-21 ENCOUNTER — Other Ambulatory Visit: Payer: Self-pay | Admitting: Adult Health

## 2022-06-21 DIAGNOSIS — R1310 Dysphagia, unspecified: Secondary | ICD-10-CM

## 2022-06-21 NOTE — Telephone Encounter (Signed)
Pt is calling and would like a mammogram results. Pt had mammo at breast center on monday

## 2022-06-21 NOTE — Telephone Encounter (Signed)
Pt notified to wait for imaging to call her. Pt stated that another imaging is required. I advised pt that we will see what the imaging center sends [name] so that we can put in the correct order. Pt verbalized understanding.

## 2022-06-22 ENCOUNTER — Other Ambulatory Visit: Payer: Self-pay | Admitting: Adult Health

## 2022-06-22 DIAGNOSIS — R928 Other abnormal and inconclusive findings on diagnostic imaging of breast: Secondary | ICD-10-CM

## 2022-06-22 NOTE — Telephone Encounter (Signed)
FURTHER REFILLS TO BE AUTHORIZED BY PRIMARY CARE PROVIDER

## 2022-06-30 ENCOUNTER — Other Ambulatory Visit: Payer: Self-pay | Admitting: Adult Health

## 2022-06-30 DIAGNOSIS — F410 Panic disorder [episodic paroxysmal anxiety] without agoraphobia: Secondary | ICD-10-CM

## 2022-06-30 DIAGNOSIS — G4700 Insomnia, unspecified: Secondary | ICD-10-CM

## 2022-07-06 ENCOUNTER — Ambulatory Visit
Admission: RE | Admit: 2022-07-06 | Discharge: 2022-07-06 | Disposition: A | Discharge status: Home / Self Care | Payer: Medicare HMO | Source: Ambulatory Visit | Attending: Adult Health | Admitting: Adult Health

## 2022-07-06 DIAGNOSIS — R928 Other abnormal and inconclusive findings on diagnostic imaging of breast: Secondary | ICD-10-CM

## 2022-07-06 DIAGNOSIS — R921 Mammographic calcification found on diagnostic imaging of breast: Secondary | ICD-10-CM | POA: Diagnosis not present

## 2022-07-12 ENCOUNTER — Ambulatory Visit (INDEPENDENT_AMBULATORY_CARE_PROVIDER_SITE_OTHER): Payer: Medicare HMO | Admitting: Adult Health

## 2022-07-12 ENCOUNTER — Encounter: Payer: Self-pay | Admitting: Adult Health

## 2022-07-12 DIAGNOSIS — F410 Panic disorder [episodic paroxysmal anxiety] without agoraphobia: Secondary | ICD-10-CM | POA: Diagnosis not present

## 2022-07-12 DIAGNOSIS — F411 Generalized anxiety disorder: Secondary | ICD-10-CM

## 2022-07-12 DIAGNOSIS — F331 Major depressive disorder, recurrent, moderate: Secondary | ICD-10-CM | POA: Diagnosis not present

## 2022-07-12 DIAGNOSIS — G4700 Insomnia, unspecified: Secondary | ICD-10-CM

## 2022-07-12 MED ORDER — QUETIAPINE FUMARATE 400 MG PO TABS: 800.0000 mg | ORAL_TABLET | Freq: Every day | ORAL | 1 refills | Status: DC

## 2022-07-12 MED ORDER — VENLAFAXINE HCL ER 75 MG PO CP24: ORAL_CAPSULE | ORAL | 1 refills | Status: DC

## 2022-07-12 MED ORDER — ALPRAZOLAM 1 MG PO TABS: ORAL_TABLET | ORAL | 2 refills | Status: DC

## 2022-07-12 NOTE — Progress Notes (Signed)
Leslie Benson:[id number] 09-13-1960 62 y.o.  Subjective:   Patient ID:  Leslie Benson is a 62 y.o. (DOB [birth year]) female.  Chief Complaint: No chief complaint on file.   [name] presents to the office today for follow-up of GAD, MDD, panic attacks and insomnia.  Describes mood today as "ok". Pleasant. Tearful at times. Mood symptoms - reports depression, anxiety, and irritability. Reports increased panic attacks. Mood is lower. Stating "I'm hanging in there". Reports chronic pain issues. Husband retired. Feels like medications are helpful. Varying interest and motivation. Taking medications as prescribed.  Energy levels lower. Active, does not have a regular exercise routine.   Enjoys some usual interests and activities. Married. Lives with husband. Has 3 grown daughters - 2 grandchildren. Spending time with family. Talking to sisters. Appetite adequate. Weight loss - 181 from 195 pounds. Sleeps well most nights. Averages 6 hours with Seroquel.   Focus and concentration difficulties. Completing tasks. Managing aspects of household. Receives SSI disability benefits for Fibromyalgia - 20 years.  Denies SI or HI.  Denies Benson or VH. Denies self harm. Denies substance use.  Previous medication trials: Unknown   PHQ2-9    [place] Office Visit from 04/28/2022 in [facility] at [facility] Most recent reading at 04/28/2022  1:03 PM Office Visit from 12/07/2021 in [place] at [place] Most recent reading at 12/07/2021  3:11 PM Video Visit from 07/27/2021 in [place] at [place] Most recent reading at 07/27/2021  3:13 PM Clinical Support from 11/30/2020 in [facility] at [place] [facility] Most recent reading at 11/30/2020  3:24 PM Office Visit from 11/30/2020 in [facility] at [place] Most recent reading at 11/30/2020 12:58 PM  PHQ-2 Total Score 3 3 6 1 1   PHQ-9 Total Score 7 8 9   -- --      Flowsheet Row ED from 05/12/2022 in [facility] Emergency Department at [facility] Office Visit from 12/07/2021 in [place] at [facility] ED to Hosp-Admission (Discharged) from 12/01/2020 in [facility]  C-SSRS RISK CATEGORY No Risk Low Risk No Risk        Review of Systems:  Review of Systems  Musculoskeletal:  Negative for gait problem.  Neurological:  Negative for tremors.  Psychiatric/Behavioral:         Please refer to HPI    Medications: I have reviewed the patient's current medications.  Current Outpatient Medications  Medication Sig Dispense Refill   ALPRAZolam (XANAX) 1 MG tablet TAKE 1 TABLET BY MOUTH FIVE TIMES DAILY FOR ANXIETY AND  PANIC  ATTACKS 150 tablet 2   atorvastatin (LIPITOR) 40 MG tablet TAKE 1 TABLET BY MOUTH DAILY 90 tablet 0   levothyroxine (SYNTHROID) 25 MCG tablet TAKE 1 TABLET EVERY DAY BEFORE BREAKFAST (NEED MD APPOINTMENT) 90 tablet 3   meclizine (ANTIVERT) 25 MG tablet Take 1 tablet (25 mg total) by mouth 3 (three) times daily as needed for dizziness. 30 tablet 0   Multiple Vitamin (MULTIVITAMIN ADULT PO) Take 1 tablet by mouth daily.     omega-3 acid ethyl esters (LOVAZA) 1 G capsule Take 1 g by mouth 2 (two) times daily.     omeprazole (PRILOSEC) 20 MG capsule Take 1 capsule (20 mg total) by mouth daily. 90 capsule 3   OVER THE COUNTER MEDICATION Take 1 tablet by mouth daily. Fiber Well     pregabalin (LYRICA) 75 MG capsule Take 1  capsule (75 mg total) by mouth 2 (two) times daily. 60 capsule 2   QUEtiapine (SEROQUEL) 400 MG tablet Take 2 tablets (800 mg total) by mouth at bedtime. 180 tablet 1   Semaglutide,0.25 or 0.5MG /DOS, (OZEMPIC, 0.25 OR 0.5 MG/DOSE,) 2 MG/3ML SOPN INJECT 0.5 MG UNDER THE SKIN ONCE A WEEK 3 mL 3   venlafaxine XR (EFFEXOR-XR) 75 MG 24 hr capsule TAKE 3 CAPSULES EVERY DAY 270 capsule 1   No current facility-administered medications for this visit.    Medication Side  Effects: None  Allergies:  Allergies  Allergen Reactions   Codeine Nausea And Vomiting   Compazine [Prochlorperazine Edisylate]     "feels weird"   Sulfa Antibiotics     Unknown reaction.    Past Medical History:  Diagnosis Date   ADHD    Allergy    Anemia    past hx    Anxiety    Barrett esophagus    Breast nodule    benign   Cervical arthritis 09/22/2013   Depression    Elevated liver enzymes    Fatty liver    Fibromyalgia    Fibromyalgia    GERD (gastroesophageal reflux disease)    H/O hiatal hernia    Headache(784.0)    migraines   High cholesterol    History of UTI    Lymphocytic colitis    Migraines    Pain    Panic attack    Paraesophageal hiatal hernia    repaired 2009   Spinal stenosis    Thyroid disease    Thyroid disease    Vertigo     Past Medical History, Surgical history, Social history, and Family history were reviewed and updated as appropriate.   Please see review of systems for further details on the patient's review from today.   Objective:   Physical Exam:  There were no vitals taken for this visit.  Physical Exam Constitutional:      General: She is not in acute distress. Musculoskeletal:        General: No deformity.  Neurological:     Mental Status: She is alert and oriented to person, place, and time.     Coordination: Coordination normal.  Psychiatric:        Attention and Perception: Attention and perception normal. She does not perceive auditory or visual hallucinations.        Mood and Affect: Mood normal. Mood is not anxious or depressed. Affect is not labile, blunt, angry or inappropriate.        Speech: Speech normal.        Behavior: Behavior normal.        Thought Content: Thought content normal. Thought content is not paranoid or delusional. Thought content does not include homicidal or suicidal ideation. Thought content does not include homicidal or suicidal plan.        Cognition and Memory: Cognition and memory  normal.        Judgment: Judgment normal.     Comments: Insight intact     Lab Review:     Component Value Date/Time   NA 136 05/12/2022 2211   NA 141 10/01/2019 1323   K 3.7 05/12/2022 2211   CL 101 05/12/2022 2211   CO2 27 05/12/2022 2211   GLUCOSE 93 05/12/2022 2211   BUN 11 05/12/2022 2211   BUN 10 10/01/2019 1323   CREATININE 0.93 05/12/2022 2211   CALCIUM 8.7 (L) 05/12/2022 2211   PROT 7.0 05/12/2022 2211  PROT 7.2 09/25/2018 1125   ALBUMIN 4.1 05/12/2022 2211   ALBUMIN 4.8 09/25/2018 1125   AST 38 05/12/2022 2211   ALT 46 (H) 05/12/2022 2211   ALKPHOS 100 05/12/2022 2211   BILITOT 0.3 05/12/2022 2211   BILITOT 0.3 09/25/2018 1125   GFRNONAA >60 05/12/2022 2211   GFRAA 67 10/01/2019 1323       Component Value Date/Time   WBC 5.8 05/12/2022 2211   RBC 3.94 05/12/2022 2211   HGB 12.2 05/12/2022 2211   HCT 37.9 05/12/2022 2211   PLT 196 05/12/2022 2211   MCV 96.2 05/12/2022 2211   MCH 31.0 05/12/2022 2211   MCHC 32.2 05/12/2022 2211   RDW 13.2 05/12/2022 2211   LYMPHSABS 2.4 05/12/2022 2211   MONOABS 0.5 05/12/2022 2211   EOSABS 0.1 05/12/2022 2211   BASOSABS 0.0 05/12/2022 2211    No results found for: "POCLITH", "LITHIUM"   No results found for: "PHENYTOIN", "PHENOBARB", "VALPROATE", "CBMZ"   .res Assessment: Plan:    Plan:  PDMP reviewed  1. Decrease Xanax 1mg  - 5 times daily - discussed continuing taper, but patient is struggling with increase anxiety. 2. Seroquel 400mg  - 2 at hs  3. Effexor XR 75mg  TID  RTC 6 months - will call in 3 months for next Xanax prescription.  Patient advised to contact office with any questions, adverse effects, or acute worsening in signs and symptoms.  Discussed potential benefits, risk, and side effects of benzodiazepines to include potential risk of tolerance and dependence, as well as possible drowsiness.  Advised patient not to drive if experiencing drowsiness and to take lowest possible effective dose to  minimize risk of dependence and tolerance.  Discussed potential metabolic side effects associated with atypical antipsychotics, as well as potential risk for movement side effects. Advised pt to contact office if movement side effects occur.   Diagnoses and all orders for this visit:  Panic attacks -     ALPRAZolam (XANAX) 1 MG tablet; TAKE 1 TABLET BY MOUTH FIVE TIMES DAILY FOR ANXIETY AND  PANIC  ATTACKS  Insomnia, unspecified type -     ALPRAZolam (XANAX) 1 MG tablet; TAKE 1 TABLET BY MOUTH FIVE TIMES DAILY FOR ANXIETY AND  PANIC  ATTACKS -     QUEtiapine (SEROQUEL) 400 MG tablet; Take 2 tablets (800 mg total) by mouth at bedtime.  Major depressive disorder, recurrent episode, moderate (HCC) -     venlafaxine XR (EFFEXOR-XR) 75 MG 24 hr capsule; TAKE 3 CAPSULES EVERY DAY -     QUEtiapine (SEROQUEL) 400 MG tablet; Take 2 tablets (800 mg total) by mouth at bedtime.  Generalized anxiety disorder -     venlafaxine XR (EFFEXOR-XR) 75 MG 24 hr capsule; TAKE 3 CAPSULES EVERY DAY     Please see After Visit Summary for patient specific instructions.  Future Appointments  Date Time Provider [place]  07/18/2022  3:30 PM Irene Shipper, MD [facility]  07/28/2022  3:00 PM [name], NP [facility]  12/12/2022  9:00 AM [facility]-ANNUAL WELLNESS VISIT [facility]    No orders of the defined types were placed in this encounter.   -------------------------------

## 2022-07-18 ENCOUNTER — Encounter: Payer: Self-pay | Admitting: Internal Medicine

## 2022-07-18 ENCOUNTER — Ambulatory Visit (AMBULATORY_SURGERY_CENTER): Payer: Medicare HMO | Admitting: Internal Medicine

## 2022-07-18 ENCOUNTER — Encounter: Payer: Medicare HMO | Admitting: Internal Medicine

## 2022-07-18 VITALS — BP 86/63 | HR 77 | Temp 97.1°F | Resp 11 | Ht 64.0 in | Wt 185.0 lb

## 2022-07-18 DIAGNOSIS — K219 Gastro-esophageal reflux disease without esophagitis: Secondary | ICD-10-CM

## 2022-07-18 DIAGNOSIS — K222 Esophageal obstruction: Secondary | ICD-10-CM | POA: Diagnosis not present

## 2022-07-18 DIAGNOSIS — M797 Fibromyalgia: Secondary | ICD-10-CM | POA: Diagnosis not present

## 2022-07-18 DIAGNOSIS — R1310 Dysphagia, unspecified: Secondary | ICD-10-CM

## 2022-07-18 DIAGNOSIS — F419 Anxiety disorder, unspecified: Secondary | ICD-10-CM | POA: Diagnosis not present

## 2022-07-18 DIAGNOSIS — F32A Depression, unspecified: Secondary | ICD-10-CM | POA: Diagnosis not present

## 2022-07-18 DIAGNOSIS — K760 Fatty (change of) liver, not elsewhere classified: Secondary | ICD-10-CM | POA: Diagnosis not present

## 2022-07-18 MED ORDER — OMEPRAZOLE 40 MG PO CPDR: 40.0000 mg | DELAYED_RELEASE_CAPSULE | Freq: Every day | ORAL | 11 refills | Status: DC

## 2022-07-18 MED ORDER — SODIUM CHLORIDE 0.9 % IV SOLN: 500.0000 mL | Freq: Once | INTRAVENOUS | Status: DC

## 2022-07-18 NOTE — Progress Notes (Signed)
Called to room to assist during endoscopic procedure.  Patient ID and intended procedure confirmed with present staff. Received instructions for my participation in the procedure from the performing physician.  

## 2022-07-18 NOTE — Op Note (Signed)
[name] Patient Name: Leslie Benson Procedure Date: 07/18/2022 2:17 PM MRN: [id number] Endoscopist: Docia Chuck. Henrene Pastor , MD, [id number] Age: 62 Referring MD:  Date of Birth: 06-12-1960 Gender: Female Account #: [id number] Procedure:                Upper GI endoscopy with balloon dilation of the                            esophagus. 18-20 mm Indications:              Therapeutic procedure, Dysphagia, Esophageal                            reflux. History of fundoplication Medicines:                Monitored Anesthesia Care Procedure:                Pre-Anesthesia Assessment:                           - Prior to the procedure, a History and Physical                            was performed, and patient medications and                            allergies were reviewed. The patient's tolerance of                            previous anesthesia was also reviewed. The risks                            and benefits of the procedure and the sedation                            options and risks were discussed with the patient.                            All questions were answered, and informed consent                            was obtained. Prior Anticoagulants: The patient has                            taken no anticoagulant or antiplatelet agents. ASA                            Grade Assessment: II - A patient with mild systemic                            disease. After reviewing the risks and benefits,                            the patient was deemed in satisfactory condition to  undergo the procedure.                           After obtaining informed consent, the endoscope was                            passed under direct vision. Throughout the                            procedure, the patient's blood pressure, pulse, and                            oxygen saturations were monitored continuously. The                            GIF [id number] was  introduced through the                            mouth, and advanced to the second part of duodenum.                            The upper GI endoscopy was accomplished without                            difficulty. The patient tolerated the procedure                            well. Scope In: Scope Out: Findings:                 The esophagus was slightly dilated. There was                            erythema and edema at the gastroesophageal                            junction. Mild narrowing (38 cm). The wrap was                            intact but appeared loose. A TTS dilator was passed                            through the scope. Dilation with an 18-19-20 mm                            balloon dilator was performed to 20 mm.                           The stomach revealed evidence of prior                            fundoplication. No other abnormalities of                            significance.  The examined duodenum was normal.                           The cardia and gastric fundus were normal on                            retroflexion. Complications:            No immediate complications. Estimated Blood Loss:     Estimated blood loss: none. Impression:               1. GERD with esophagitis                           2. Mild esophageal stricture status post dilation.                           3. History of fundoplication. Intact but loose wrap.                           4. Otherwise unremarkable EGD Recommendation:           - Patient has a contact number available for                            emergencies. The signs and symptoms of potential                            delayed complications were discussed with the                            patient. Return to normal activities tomorrow.                            Written discharge instructions were provided to the                            patient.                           - Resume previous  diet.                           - Continue present medications.                           -Prescribe OMEPRAZOLE 40 mg daily; #30; 11 refills.                            This is a higher dose of the current acid reflux                            medication that you are taking. We are prescribing                            this because active inflammation of the esophagus  was noted on today's exam. Docia Chuck. Henrene Pastor, MD 07/18/2022 2:40:52 PM This report has been signed electronically.

## 2022-07-18 NOTE — Patient Instructions (Addendum)
Continue present medications. Prescribe OMEPRAZOLE 40 mg daily; #30; 11 refills.  Follow dilation diet!!!   YOU HAD AN ENDOSCOPIC PROCEDURE TODAY AT [facility]:   Refer to the procedure report that was given to you for any specific questions about what was found during the examination.  If the procedure report does not answer your questions, please call your gastroenterologist to clarify.  If you requested that your care partner not be given the details of your procedure findings, then the procedure report has been included in a sealed envelope for you to review at your convenience later.  YOU SHOULD EXPECT: Some feelings of bloating in the abdomen. Passage of more gas than usual.  Walking can help get rid of the air that was put into your GI tract during the procedure and reduce the bloating. .  Please Note:  You might notice some irritation and congestion in your nose or some drainage.  This is from the oxygen used during your procedure.  There is no need for concern and it should clear up in a day or so.  SYMPTOMS TO REPORT IMMEDIATELY:  Following upper endoscopy (EGD)  Vomiting of blood or coffee ground material  New chest pain or pain under the shoulder blades  Painful or persistently difficult swallowing  New shortness of breath  Fever of 100F or higher  Black, tarry-looking stools  For urgent or emergent issues, a gastroenterologist can be reached at any hour by calling [phone]. Do not use MyChart messaging for urgent concerns.    DIET: DILATION DIET!!!.  Drink plenty of fluids but you should avoid alcoholic beverages for 24 hours.  ACTIVITY:  You should plan to take it easy for the rest of today and you should NOT DRIVE or use heavy machinery until tomorrow (because of the sedation medicines used during the test).    FOLLOW UP: Our staff will call the number listed on your records the next business day following your procedure.  We will call around  7:15- 8:00 am to check on you and address any questions or concerns that you may have regarding the information given to you following your procedure. If we do not reach you, we will leave a message.     If any biopsies were taken you will be contacted by phone or by letter within the next 1-3 weeks.  Please call us at [phone] if you have not heard about the biopsies in 3 weeks.    SIGNATURES/CONFIDENTIALITY: You and/or your care partner have signed paperwork which will be entered into your electronic medical record.  These signatures attest to the fact that that the information above on your After Visit Summary has been reviewed and is understood.  Full responsibility of the confidentiality of this discharge information lies with you and/or your care-partner.

## 2022-07-18 NOTE — Progress Notes (Signed)
A and O x3. Report to RN. Tolerated MAC anesthesia well.Teeth unchanged after procedure. 

## 2022-07-18 NOTE — Progress Notes (Signed)
Expand All Collapse All    Chief Complaint: Abdominal pain, bloating and constipation as well as follow-up of reflux   HPI:    Leslie Benson is a 62 year old female with a past medical history as listed below including anxiety and GERD, known to Dr. Henrene Pastor, who presents to clinic today with a new complaint of abdominal pain, bloating and constipation as well as for follow-up of her reflux.    04/13/2019 colonoscopy with two 2-3 mm polyps in the rectum and distal transverse colon.  Pathology showed tubular adenomas and repeat recommended in 7 years.    01/22/2020 EGD was normal.  Esophagus dilated empirically, stomach status post intact fundoplication.    04/28/2022 hemoglobin A1c normal.    05/12/2022 CMP with a minimally elevated ALT at 46, normal CBC and differential.    Today, the patient presents to clinic and explains that just over the past couple of weeks she has noticed that her medicines are getting stuck again in her throat.  This is similar to when she had her last EGD and had her esophagus dilated which helped her immensely, in fact she has not had any trouble over the past 3 years.  Does tell me she needs a refill of her Omeprazole which she thinks she is taking 20 mg in the morning.  Has no breakthrough reflux symptoms.    Patient did start Ozempic about a year ago and over the past 6 months or so has noticed a change to constipation.  Just recently she has started Dulcolax 1-2 tabs a day and this works really well for her.  She was having associated left-sided abdominal pain and gas/bloating.  All of this seems to be getting a little bit better over the past few days and she has been using Dulcolax.    Denies fever, chills, weight loss or blood in her stool.       Past Medical History:  Diagnosis Date   ADHD     Allergy     Anemia      past hx    Anxiety     Barrett esophagus     Breast nodule      benign   Cervical arthritis 09/22/2013   Depression     Elevated liver enzymes      Fatty liver     Fibromyalgia     Fibromyalgia     GERD (gastroesophageal reflux disease)     H/O hiatal hernia     Headache(784.0)      migraines   High cholesterol     History of UTI     Lymphocytic colitis     Migraines     Pain     Panic attack     Paraesophageal hiatal hernia      repaired 2009   Spinal stenosis     Thyroid disease     Thyroid disease     Vertigo             Past Surgical History:  Procedure Laterality Date   ANKLE SURGERY       CHOLECYSTECTOMY   1980s?   COLONOSCOPY   2010    Buccini   EUS N/A 11/19/2013    Procedure: ESOPHAGEAL ENDOSCOPIC ULTRASOUND (EUS) RADIAL;  Surgeon: Arta Silence, MD;  Location: [facility];  Service: Endoscopy;  Laterality: N/A;   LAPAROSCOPIC NISSEN FUNDOPLICATION   0000000   LAPAROSCOPIC PARAESOPHAGEAL HERNIA REPAIR   03/05/2008    Dr Johney Maine   TUBAL LIGATION  1990s?   UPPER GASTROINTESTINAL ENDOSCOPY   04/05/2018    Scarlette Shorts    VAGINAL HYSTERECTOMY   03/04/2001            Current Outpatient Medications  Medication Sig Dispense Refill   ALPRAZolam (XANAX) 1 MG tablet TAKE 1 TABLET BY MOUTH FIVE TIMES DAILY FOR ANXIETY AND  PANIC  ATTACKS 150 tablet 1   atorvastatin (LIPITOR) 40 MG tablet TAKE 1 TABLET BY MOUTH DAILY 90 tablet 0   levothyroxine (SYNTHROID) 25 MCG tablet Take 1 tablet (25 mcg total) by mouth daily before breakfast. 90 tablet 0   meclizine (ANTIVERT) 25 MG tablet Take 1 tablet (25 mg total) by mouth 3 (three) times daily as needed for dizziness. 30 tablet 0   Multiple Vitamin (MULTIVITAMIN ADULT PO) Take 1 tablet by mouth daily.       omega-3 acid ethyl esters (LOVAZA) 1 G capsule Take 1 g by mouth 2 (two) times daily.       omeprazole (PRILOSEC) 20 MG capsule Take 1 capsule (20 mg total) by mouth daily. 90 capsule 0   OVER THE COUNTER MEDICATION Take 1 tablet by mouth daily. Fiber Well       pregabalin (LYRICA) 75 MG capsule Take 1 capsule (75 mg total) by mouth 2 (two) times daily. 60 capsule 2    QUEtiapine (SEROQUEL) 400 MG tablet Take 2 tablets (800 mg total) by mouth at bedtime. 180 tablet 1   Semaglutide,0.25 or 0.5MG /DOS, (OZEMPIC, 0.25 OR 0.5 MG/DOSE,) 2 MG/3ML SOPN INJECT 0.5 MG UNDER THE SKIN ONCE A WEEK 3 mL 3   venlafaxine XR (EFFEXOR-XR) 75 MG 24 hr capsule TAKE 3 CAPSULES EVERY DAY 270 capsule 1    No current facility-administered medications for this visit.           Allergies as of 06/09/2022 - Review Complete 06/09/2022  Allergen Reaction Noted   Codeine Nausea And Vomiting 06/20/2012   Compazine [prochlorperazine edisylate]   08/19/2014   Sulfa antibiotics   06/20/2012           Family History  Problem Relation Age of Onset   Alcohol abuse Other     Elevated Lipids Other     Heart disease Other     Anxiety disorder Mother     Obesity Mother     High Cholesterol Mother     Thyroid cancer Mother     Depression Mother     Breast cancer Mother     Bone cancer Mother     High Cholesterol Father     Heart disease Father     Alcoholism Father     Colon cancer Maternal Aunt     Colon polyps Sister     Stomach cancer Neg Hx     Esophageal cancer Neg Hx     Rectal cancer Neg Hx        Social History         Socioeconomic History   Marital status: Married      Spouse name: Holiday representative   Number of children: 3   Years of education: Not on file   Highest education level: Not on file  Occupational History   Occupation: disabled  Tobacco Use   Smoking status: Never   Smokeless tobacco: Never  Vaping Use   Vaping Use: Never used  Substance and Sexual Activity   Alcohol use: Not Currently      Comment: wine occassionally   Drug use: No   Sexual activity:  Yes      Birth control/protection: None      Comment: n/a partial hysterectomy  Other Topics Concern   Not on file  Social History Narrative    Married     Lived in [place].  Moved to [place] in early 2000s    She is on disability for anxiety     Social Determinants of Health        Financial  Resource Strain: Low Risk  (12/21/2021)    Overall Financial Resource Strain (CARDIA)     Difficulty of Paying Living Expenses: Not hard at all  Food Insecurity: No Food Insecurity (12/07/2021)    Hunger Vital Sign     Worried About Running Out of Food in the Last Year: Never true     Ran Out of Food in the Last Year: Never true  Transportation Needs: No Transportation Needs (12/21/2021)    PRAPARE - Armed forces logistics/support/administrative officer (Medical): No     Lack of Transportation (Non-Medical): No  Physical Activity: Inactive (12/07/2021)    Exercise Vital Sign     Days of Exercise per Week: 0 days     Minutes of Exercise per Session: 0 min  Stress: Stress Concern Present (12/07/2021)    Bell     Feeling of Stress : Rather much  Social Connections: Moderately Isolated (12/07/2021)    Social Connection and Isolation Panel [NHANES]     Frequency of Communication with Friends and Family: More than three times a week     Frequency of Social Gatherings with Friends and Family: More than three times a week     Attends Religious Services: Never     Marine scientist or Organizations: No     Attends Archivist Meetings: Never     Marital Status: Married  Human resources officer Violence: Not At Risk (12/07/2021)    Humiliation, Afraid, Rape, and Kick questionnaire     Fear of Current or Ex-Partner: No     Emotionally Abused: No     Physically Abused: No     Sexually Abused: No      Review of Systems:    Constitutional: No weight loss, fever or chills Skin: No rash  Cardiovascular: No chest pain  Respiratory: No SOB  Gastrointestinal: See HPI and otherwise negative Genitourinary: No dysuria  Neurological: No headache, dizziness or syncope Musculoskeletal: No new muscle or joint pain Hematologic: No bleeding  Psychiatric: +depression/anxiety    Physical Exam:  Vital signs: BP 100/66 (BP Location: Left  Arm, Patient Position: Sitting, Cuff Size: Normal)   Pulse 84   Ht 5\' 4"  (1.626 m)   Wt 185 lb 2 oz (84 kg)   BMI 31.78 kg/m     Constitutional:   Pleasant overweight Caucasian female appears to be in NAD, Well developed, Well nourished, alert and cooperative Head:  Normocephalic and atraumatic. Eyes:   PEERL, EOMI. No icterus. Conjunctiva pink. Ears:  Normal auditory acuity. Neck:  Supple Throat: Oral cavity and pharynx without inflammation, swelling or lesion.  Respiratory: Respirations even and unlabored. Lungs clear to auscultation bilaterally.   No wheezes, crackles, or rhonchi.  Cardiovascular: Normal S1, S2. No MRG. Regular rate and rhythm. No peripheral edema, cyanosis or pallor.  Gastrointestinal:  Soft, nondistended, mild epigastric ttp. No rebound or guarding. Normal bowel sounds. No appreciable masses or hepatomegaly. Rectal:  Not performed.  Msk:  Symmetrical without gross deformities. Without edema,  no deformity or joint abnormality.  Neurologic:  Alert and  oriented x4;  grossly normal neurologically.  Skin:   Dry and intact without significant lesions or rashes. Psychiatric: Demonstrates good judgement and reason without abnormal affect or behaviors.   RELEVANT LABS AND IMAGING: CBC Labs (Brief)          Component Value Date/Time    WBC 5.8 05/12/2022 2211    RBC 3.94 05/12/2022 2211    HGB 12.2 05/12/2022 2211    HCT 37.9 05/12/2022 2211    PLT 196 05/12/2022 2211    MCV 96.2 05/12/2022 2211    MCH 31.0 05/12/2022 2211    MCHC 32.2 05/12/2022 2211    RDW 13.2 05/12/2022 2211    LYMPHSABS 2.4 05/12/2022 2211    MONOABS 0.5 05/12/2022 2211    EOSABS 0.1 05/12/2022 2211    BASOSABS 0.0 05/12/2022 2211        CMP     Labs (Brief)          Component Value Date/Time    NA 136 05/12/2022 2211    NA 141 10/01/2019 1323    K 3.7 05/12/2022 2211    CL 101 05/12/2022 2211    CO2 27 05/12/2022 2211    GLUCOSE 93 05/12/2022 2211    BUN 11 05/12/2022 2211     BUN 10 10/01/2019 1323    CREATININE 0.93 05/12/2022 2211    CALCIUM 8.7 (L) 05/12/2022 2211    PROT 7.0 05/12/2022 2211    PROT 7.2 09/25/2018 1125    ALBUMIN 4.1 05/12/2022 2211    ALBUMIN 4.8 09/25/2018 1125    AST 38 05/12/2022 2211    ALT 46 (H) 05/12/2022 2211    ALKPHOS 100 05/12/2022 2211    BILITOT 0.3 05/12/2022 2211    BILITOT 0.3 09/25/2018 1125    GFRNONAA >60 05/12/2022 2211    GFRAA 67 10/01/2019 1323        Assessment: 1.  Dysphagia: Trouble with this in 2021 with the EGD normal with empiric dilation which helped; consider esophageal stricture versus other 2.  GERD: Controlled on Omeprazole 20 mg daily 3.  Constipation: New for the patient over the past 6 months, did start Ozempic about a year ago, better with Dulcolax; likely medication side effect 4.  History of adenomatous polyps: Last colonoscopy in 2020 with repeat recommended in 7 years     Plan: 1.  Scheduled patient for diagnostic EGD with dilation in the [facility] with Dr. Henrene Pastor.  Did provide the patient a detailed list risks for the procedure and she agrees to proceed. Patient is appropriate for endoscopic procedure(s) in the ambulatory ([facility]) setting.  2.  Refilled Omeprazole 20 mg every morning, 30-60 minutes before breakfast #90 with 3 refills sent to the patient's [place] per her request. 3.  Continue Dulcolax as needed for constipation as this seems to be working well for her. 4.  Patient to follow in clinic per recommendations after time of procedure.   Ellouise Newer, PA-C [facility] 06/09/2022, 3:07 PM  Recent complete H&P as above.  No interval change.  Now for upper endoscopy for dysphagia..  Dilation previously.  May be related to her fundoplication.

## 2022-07-18 NOTE — Progress Notes (Signed)
Pt's states no medical or surgical changes since previsit or office visit. VS assessed by C.W 

## 2022-07-19 ENCOUNTER — Telehealth: Payer: Self-pay | Admitting: *Deleted

## 2022-07-19 NOTE — Telephone Encounter (Signed)
  Follow up Call-     07/18/2022    1:05 PM 01/22/2020    7:37 AM  Call back number  Post procedure Call Back phone  # [phone]  Permission to leave phone message Yes Yes    LMOM

## 2022-07-24 ENCOUNTER — Other Ambulatory Visit: Payer: Self-pay | Admitting: Adult Health

## 2022-07-25 NOTE — Telephone Encounter (Signed)
Okay for refill?  

## 2022-07-28 ENCOUNTER — Ambulatory Visit: Payer: Medicare HMO | Admitting: Adult Health

## 2022-08-03 ENCOUNTER — Other Ambulatory Visit: Payer: Self-pay | Admitting: Adult Health

## 2022-08-03 DIAGNOSIS — E118 Type 2 diabetes mellitus with unspecified complications: Secondary | ICD-10-CM

## 2022-08-04 ENCOUNTER — Ambulatory Visit: Payer: Medicare HMO | Admitting: Adult Health

## 2022-08-09 ENCOUNTER — Encounter: Payer: Self-pay | Admitting: Adult Health

## 2022-08-09 ENCOUNTER — Ambulatory Visit (INDEPENDENT_AMBULATORY_CARE_PROVIDER_SITE_OTHER): Payer: Medicare HMO | Admitting: Adult Health

## 2022-08-09 VITALS — BP 98/60 | HR 85 | Temp 98.4°F | Ht 64.0 in | Wt 184.0 lb

## 2022-08-09 DIAGNOSIS — R42 Dizziness and giddiness: Secondary | ICD-10-CM | POA: Diagnosis not present

## 2022-08-09 DIAGNOSIS — R7303 Prediabetes: Secondary | ICD-10-CM

## 2022-08-09 DIAGNOSIS — E669 Obesity, unspecified: Secondary | ICD-10-CM | POA: Diagnosis not present

## 2022-08-09 MED ORDER — MECLIZINE HCL 25 MG PO TABS: 25.0000 mg | ORAL_TABLET | Freq: Three times a day (TID) | ORAL | 1 refills | Status: DC | PRN

## 2022-08-09 MED ORDER — SEMAGLUTIDE (1 MG/DOSE) 4 MG/3ML ~~LOC~~ SOPN: 1.0000 mg | PEN_INJECTOR | SUBCUTANEOUS | 0 refills | Status: DC

## 2022-08-09 NOTE — Progress Notes (Addendum)
Subjective:    Patient ID: Leslie Benson, female    DOB: 12-02-1960, 62 y.o.   MRN: [id number]  HPI 62 year old female who  has a past medical history of ADHD, Allergy, Anemia, Anxiety, Barrett esophagus, Breast nodule, Cervical arthritis (09/22/2013), Depression, Elevated liver enzymes, Fatty liver, Fibromyalgia, Fibromyalgia, GERD (gastroesophageal reflux disease), H/O hiatal hernia, Headache(784.0), High cholesterol, History of UTI, Lymphocytic colitis, Migraines, Pain, Panic attack, Paraesophageal hiatal hernia, Spinal stenosis, Thyroid disease, Thyroid disease, and Vertigo.  She presents to the office today for follow up regarding pre diabetes and obesity  She is currently managed with Ozempic 0.5 mg weekly..  She has been tolerating this medication well with no side effects.  She is eating healthier but her weight loss has stalled.  Lab Results  Component Value Date   HGBA1C 5.4 04/28/2022   Wt Readings from Last 3 Encounters:  08/09/22 184 lb (83.5 kg)  07/18/22 185 lb (83.9 kg)  06/09/22 185 lb 2 oz (84 kg)   She also reports that she continues to have bouts of vertigo. She becomes dizzy when changing position and laying down. Meclizine helps but she has run out of the medication. She did have an MRI of the brain which was normal.  Review of Systems See HPI   Past Medical History:  Diagnosis Date   ADHD    Allergy    Anemia    past hx    Anxiety    Barrett esophagus    Breast nodule    benign   Cervical arthritis 09/22/2013   Depression    Elevated liver enzymes    Fatty liver    Fibromyalgia    Fibromyalgia    GERD (gastroesophageal reflux disease)    H/O hiatal hernia    Headache(784.0)    migraines   High cholesterol    History of UTI    Lymphocytic colitis    Migraines    Pain    Panic attack    Paraesophageal hiatal hernia    repaired 2009   Spinal stenosis    Thyroid disease    Thyroid disease    Vertigo     Social History   Socioeconomic  History   Marital status: Married    Spouse name: Therapist, nutritional   Number of children: 3   Years of education: Not on file   Highest education level: Not on file  Occupational History   Occupation: disabled  Tobacco Use   Smoking status: Never   Smokeless tobacco: Never  Vaping Use   Vaping Use: Never used  Substance and Sexual Activity   Alcohol use: Not Currently    Comment: wine occassionally   Drug use: No   Sexual activity: Yes    Birth control/protection: None    Comment: n/a partial hysterectomy  Other Topics Concern   Not on file  Social History Narrative   Married    Lived in [place].  Moved to [place] in early 2000s   She is on disability for anxiety    Social Determinants of Health   Financial Resource Strain: Low Risk  (12/21/2021)   Overall Financial Resource Strain (CARDIA)    Difficulty of Paying Living Expenses: Not hard at all  Food Insecurity: No Food Insecurity (12/07/2021)   Hunger Vital Sign    Worried About Running Out of Food in the Last Year: Never true    Ran Out of Food in the Last Year: Never true  Transportation Needs: No Transportation Needs (  12/21/2021)   PRAPARE - Administrator, Civil Service (Medical): No    Lack of Transportation (Non-Medical): No  Physical Activity: Inactive (12/07/2021)   Exercise Vital Sign    Days of Exercise per Week: 0 days    Minutes of Exercise per Session: 0 min  Stress: Stress Concern Present (12/07/2021)   [employer] of Occupational Health - Occupational Stress Questionnaire    Feeling of Stress : Rather much  Social Connections: Moderately Isolated (12/07/2021)   Social Connection and Isolation Panel [NHANES]    Frequency of Communication with Friends and Family: More than three times a week    Frequency of Social Gatherings with Friends and Family: More than three times a week    Attends Religious Services: Never    Database administrator or Organizations: No    Attends Banker  Meetings: Never    Marital Status: Married  Catering manager Violence: Not At Risk (12/07/2021)   Humiliation, Afraid, Rape, and Kick questionnaire    Fear of Current or Ex-Partner: No    Emotionally Abused: No    Physically Abused: No    Sexually Abused: No    Past Surgical History:  Procedure Laterality Date   ANKLE SURGERY     CHOLECYSTECTOMY  1980s?   COLONOSCOPY  2010   Buccini   EUS N/A 11/19/2013   Procedure: ESOPHAGEAL ENDOSCOPIC ULTRASOUND (EUS) RADIAL;  Surgeon: Willis Modena, MD;  Location: [facility];  Service: Endoscopy;  Laterality: N/A;   LAPAROSCOPIC NISSEN FUNDOPLICATION  03/05/2008   LAPAROSCOPIC PARAESOPHAGEAL HERNIA REPAIR  03/05/2008   Dr Michaell Cowing   TUBAL LIGATION  1990s?   UPPER GASTROINTESTINAL ENDOSCOPY  04/05/2018   Yancey Flemings    VAGINAL HYSTERECTOMY  03/04/2001    Family History  Problem Relation Age of Onset   Alcohol abuse Other    Elevated Lipids Other    Heart disease Other    Anxiety disorder Mother    Obesity Mother    High Cholesterol Mother    Thyroid cancer Mother    Depression Mother    Breast cancer Mother    Bone cancer Mother    High Cholesterol Father    Heart disease Father    Alcoholism Father    Colon cancer Maternal Aunt    Colon polyps Sister    Stomach cancer Neg Hx    Esophageal cancer Neg Hx    Rectal cancer Neg Hx     Allergies  Allergen Reactions   Codeine Nausea And Vomiting   Compazine [Prochlorperazine Edisylate]     "feels weird"   Sulfa Antibiotics     Unknown reaction.    Current Outpatient Medications on File Prior to Visit  Medication Sig Dispense Refill   ALPRAZolam (XANAX) 1 MG tablet TAKE 1 TABLET BY MOUTH FIVE TIMES DAILY FOR ANXIETY AND  PANIC  ATTACKS 150 tablet 2   atorvastatin (LIPITOR) 40 MG tablet TAKE 1 TABLET BY MOUTH DAILY 90 tablet 0   levothyroxine (SYNTHROID) 25 MCG tablet TAKE 1 TABLET EVERY DAY BEFORE BREAKFAST (NEED MD APPOINTMENT) 90 tablet 3   meclizine (ANTIVERT) 25 MG tablet  Take 1 tablet (25 mg total) by mouth 3 (three) times daily as needed for dizziness. 30 tablet 0   Multiple Vitamin (MULTIVITAMIN ADULT PO) Take 1 tablet by mouth daily.     omega-3 acid ethyl esters (LOVAZA) 1 G capsule Take 1 g by mouth 2 (two) times daily.     omeprazole (PRILOSEC) 20  MG capsule Take 1 capsule (20 mg total) by mouth daily. 90 capsule 3   omeprazole (PRILOSEC) 40 MG capsule Take 1 capsule (40 mg total) by mouth daily. 30 capsule 11   OVER THE COUNTER MEDICATION Take 1 tablet by mouth daily. Fiber Well     pregabalin (LYRICA) 75 MG capsule TAKE 1 CAPSULE TWICE DAILY 60 capsule 2   QUEtiapine (SEROQUEL) 400 MG tablet Take 2 tablets (800 mg total) by mouth at bedtime. 180 tablet 1   Semaglutide,0.25 or 0.5MG /DOS, (OZEMPIC, 0.25 OR 0.5 MG/DOSE,) 2 MG/3ML SOPN INJECT 0.5MG  UNDER THE SKIN ONE TIME WEEKLY 3 mL 0   venlafaxine XR (EFFEXOR-XR) 75 MG 24 hr capsule TAKE 3 CAPSULES EVERY DAY 270 capsule 1   No current facility-administered medications on file prior to visit.    BP 98/60   Pulse 85   Temp 98.4 F (36.9 C) (Oral)   Ht 5\' 4"  (1.626 m)   Wt 184 lb (83.5 kg)   SpO2 94%   BMI 31.58 kg/m       Objective:   Physical Exam Vitals and nursing note reviewed.  Constitutional:      Appearance: Normal appearance. She is obese.  Cardiovascular:     Rate and Rhythm: Normal rate and regular rhythm.     Pulses: Normal pulses.     Heart sounds: Normal heart sounds.  Pulmonary:     Effort: Pulmonary effort is normal.     Breath sounds: Normal breath sounds.  Musculoskeletal:        General: Normal range of motion.  Skin:    General: Skin is warm and dry.  Neurological:     General: No focal deficit present.     Mental Status: She is alert and oriented to person, place, and time.  Psychiatric:        Mood and Affect: Mood normal.        Behavior: Behavior normal.        Thought Content: Thought content normal.        Judgment: Judgment normal.       Assessment &  Plan:  1. Prediabetes  - Semaglutide, 1 MG/DOSE, 4 MG/3ML SOPN; Inject 1 mg as directed once a week.  Dispense: 9 mL; Refill: 0 - Follow up in 3 months   2. Obesity (BMI 35.0-39.9 without comorbidity) - Will increase dose to 1 mg weekly.  - Semaglutide, 1 MG/DOSE, 4 MG/3ML SOPN; Inject 1 mg as directed once a week.  Dispense: 9 mL; Refill: 0  3. Vertigo - Refused vestibular rehab right now due to cost.  - meclizine (ANTIVERT) 25 MG tablet; Take 1 tablet (25 mg total) by mouth 3 (three) times daily as needed for dizziness.  Dispense: 30 tablet; Refill: 1  Shirline Frees, NP

## 2022-09-08 ENCOUNTER — Other Ambulatory Visit: Payer: Self-pay | Admitting: Adult Health

## 2022-09-08 DIAGNOSIS — G4700 Insomnia, unspecified: Secondary | ICD-10-CM

## 2022-09-08 DIAGNOSIS — F331 Major depressive disorder, recurrent, moderate: Secondary | ICD-10-CM

## 2022-09-08 DIAGNOSIS — E669 Obesity, unspecified: Secondary | ICD-10-CM

## 2022-09-08 DIAGNOSIS — F411 Generalized anxiety disorder: Secondary | ICD-10-CM

## 2022-09-08 DIAGNOSIS — R7303 Prediabetes: Secondary | ICD-10-CM

## 2022-09-11 ENCOUNTER — Other Ambulatory Visit: Payer: Self-pay

## 2022-09-11 DIAGNOSIS — K219 Gastro-esophageal reflux disease without esophagitis: Secondary | ICD-10-CM

## 2022-09-11 DIAGNOSIS — K222 Esophageal obstruction: Secondary | ICD-10-CM

## 2022-09-11 DIAGNOSIS — R1310 Dysphagia, unspecified: Secondary | ICD-10-CM

## 2022-09-11 MED ORDER — OMEPRAZOLE 40 MG PO CPDR
40.0000 mg | DELAYED_RELEASE_CAPSULE | Freq: Every day | ORAL | 3 refills | Status: DC
Start: 2022-09-11 — End: 2023-06-20

## 2022-09-15 ENCOUNTER — Other Ambulatory Visit: Payer: Self-pay

## 2022-09-15 MED ORDER — ATORVASTATIN CALCIUM 40 MG PO TABS: ORAL_TABLET | ORAL | 0 refills | Status: DC

## 2022-10-07 ENCOUNTER — Other Ambulatory Visit: Payer: Self-pay | Admitting: Cardiology

## 2022-10-11 ENCOUNTER — Telehealth: Payer: Self-pay | Admitting: Adult Health

## 2022-10-11 ENCOUNTER — Telehealth: Payer: Self-pay | Admitting: Psychiatry

## 2022-10-11 ENCOUNTER — Other Ambulatory Visit: Payer: Self-pay | Admitting: Adult Health

## 2022-10-11 DIAGNOSIS — F410 Panic disorder [episodic paroxysmal anxiety] without agoraphobia: Secondary | ICD-10-CM

## 2022-10-11 DIAGNOSIS — G4700 Insomnia, unspecified: Secondary | ICD-10-CM

## 2022-10-11 NOTE — Telephone Encounter (Signed)
Okay for refill?  

## 2022-10-11 NOTE — Telephone Encounter (Signed)
Pt called and said she needs a refill on her xanax. She needs it sent to [facility]. She is leaving on July 5th and needs it sent in so that they have time to mail it to her

## 2022-10-12 ENCOUNTER — Other Ambulatory Visit: Payer: Self-pay | Admitting: Adult Health

## 2022-10-12 DIAGNOSIS — F410 Panic disorder [episodic paroxysmal anxiety] without agoraphobia: Secondary | ICD-10-CM

## 2022-10-12 DIAGNOSIS — G4700 Insomnia, unspecified: Secondary | ICD-10-CM

## 2022-10-12 MED ORDER — ALPRAZOLAM 1 MG PO TABS
ORAL_TABLET | ORAL | 2 refills | Status: DC
Start: 2022-10-12 — End: 2023-01-12

## 2022-10-12 NOTE — Telephone Encounter (Signed)
Script sent  

## 2022-10-12 NOTE — Telephone Encounter (Signed)
error 

## 2022-11-01 ENCOUNTER — Other Ambulatory Visit: Payer: Self-pay | Admitting: Cardiology

## 2022-11-15 ENCOUNTER — Telehealth: Payer: Self-pay | Admitting: Adult Health

## 2022-11-15 DIAGNOSIS — R42 Dizziness and giddiness: Secondary | ICD-10-CM

## 2022-11-15 MED ORDER — MECLIZINE HCL 25 MG PO TABS
25.0000 mg | ORAL_TABLET | Freq: Three times a day (TID) | ORAL | 1 refills | Status: AC | PRN
Start: 2022-11-15 — End: ?

## 2022-11-15 NOTE — Telephone Encounter (Signed)
Prescription Request  11/15/2022  LOV: 08/09/2022  What is the name of the medication or equipment? meclizine meclizine (ANTIVERT) 25 MG tablet  Have you contacted your pharmacy to request a refill? No   Which pharmacy would you like this sent to?   [facility] [address] [place], [place] - [address] [place] [address] Phone: [phone] Fax: [phone]    Patient notified that their request is being sent to the clinical staff for review and that they should receive a response within 2 business days.   Please advise at Mobile [phone] (mobile)

## 2022-11-15 NOTE — Telephone Encounter (Signed)
Prescription sent to pharmacy. No other action needed. Pt informed! 

## 2022-11-20 ENCOUNTER — Other Ambulatory Visit: Payer: Self-pay | Admitting: Adult Health

## 2022-11-20 DIAGNOSIS — F331 Major depressive disorder, recurrent, moderate: Secondary | ICD-10-CM

## 2022-11-20 DIAGNOSIS — G4700 Insomnia, unspecified: Secondary | ICD-10-CM

## 2022-11-20 DIAGNOSIS — F411 Generalized anxiety disorder: Secondary | ICD-10-CM

## 2022-11-21 DIAGNOSIS — H524 Presbyopia: Secondary | ICD-10-CM | POA: Diagnosis not present

## 2022-11-27 ENCOUNTER — Telehealth: Payer: Self-pay | Admitting: Adult Health

## 2022-11-27 NOTE — Telephone Encounter (Signed)
Pt leaving on a trip, requesting an early fill on her pregabalin (LYRICA) 75 MG capsule  [address] [facility] [address] [place], [place] - [address] Phone: [phone]  Fax: [phone]

## 2022-11-28 NOTE — Telephone Encounter (Signed)
Pt is calling and she is aware refill can take up to 3 business day . Pt is going out of town tomorrow and does not have enough med to let until she come back from [facility]

## 2022-11-28 NOTE — Telephone Encounter (Signed)
Pt notified that her refills were at [facility]. Pt stated she will wait for them to send it and make her other tablets stretch.

## 2022-11-30 ENCOUNTER — Other Ambulatory Visit: Payer: Self-pay | Admitting: Adult Health

## 2022-11-30 DIAGNOSIS — E669 Obesity, unspecified: Secondary | ICD-10-CM

## 2022-11-30 DIAGNOSIS — R7303 Prediabetes: Secondary | ICD-10-CM

## 2022-11-30 NOTE — Telephone Encounter (Signed)
Ok to fill for another month or does pt need to f/u? Please advise

## 2022-12-08 ENCOUNTER — Other Ambulatory Visit: Payer: Self-pay | Admitting: Cardiology

## 2022-12-12 ENCOUNTER — Telehealth (INDEPENDENT_AMBULATORY_CARE_PROVIDER_SITE_OTHER): Payer: Medicare HMO | Admitting: Family Medicine

## 2022-12-12 ENCOUNTER — Other Ambulatory Visit: Payer: Self-pay

## 2022-12-12 VITALS — BP 111/59 | HR 105 | Temp 97.5°F | Wt 179.0 lb

## 2022-12-12 DIAGNOSIS — Z0000 Encounter for general adult medical examination without abnormal findings: Secondary | ICD-10-CM

## 2022-12-12 NOTE — Patient Instructions (Addendum)
I really enjoyed getting to talk with you today! I am available on Tuesdays and Thursdays for virtual visits if you have any questions or concerns, or if I can be of any further assistance.   CHECKLIST FROM ANNUAL WELLNESS VISIT:  -Follow up (please call to schedule if not scheduled after visit):   -yearly for annual wellness visit with primary care office  Here is a list of your preventive care/health maintenance measures and the plan for each if any are due:  PLAN For any measures below that may be due:   Health Maintenance  Topic Date Due   COVID-19 Vaccine (4 - 2023-24 season) 12/23/2021   INFLUENZA VACCINE  11/23/2022   Diabetic kidney evaluation - Urine ACR  04/29/2027 (Originally 01/24/1979)   Diabetic kidney evaluation - eGFR measurement  05/13/2023   MAMMOGRAM  07/06/2023   Medicare Annual Wellness (AWV)  12/12/2023   Colonoscopy  04/16/2026   DTaP/Tdap/Td (3 - Td or Tdap) 11/20/2031   Hepatitis C Screening  Completed   HIV Screening  Completed   Zoster Vaccines- Shingrix  Completed   HPV VACCINES  Aged Out   FOOT EXAM  Discontinued   HEMOGLOBIN A1C  Discontinued   OPHTHALMOLOGY EXAM  Discontinued    -See a dentist at least yearly  -Get your eyes checked and then per your eye specialist's recommendations  -Other issues addressed today:   -I have included below further information regarding a healthy whole foods based diet, physical activity guidelines for adults, stress management and opportunities for social connections. I hope you find this information useful.   -----------------------------------------------------------------------------------------------------------------------------------------------------------------------------------------------------------------------------------------------------------  NUTRITION: -eat real food: lots of colorful vegetables (half the plate) and fruits -5-7 servings of vegetables and fruits per day (fresh or steamed is  best), exp. 2 servings of vegetables with lunch and dinner and 2 servings of fruit per day. Berries and greens such as kale and collards are great choices.  -consume on a regular basis: whole grains (make sure first ingredient on label contains the word "whole"), fresh fruits, fish, nuts, seeds, healthy oils (such as olive oil, avocado oil, grape seed oil) -may eat small amounts of dairy and lean meat on occasion, but avoid processed meats such as ham, bacon, lunch meat, etc. -drink water -try to avoid fast food and pre-packaged foods, processed meat -most experts advise limiting sodium to < 2300mg  per day, should limit further is any chronic conditions such as high blood pressure, heart disease, diabetes, etc. The American Heart Association advised that < 1500mg  is is ideal -try to avoid foods that contain any ingredients with names you do not recognize  -try to avoid sugar/sweets (except for the natural sugar that occurs in fresh fruit) -try to avoid sweet drinks -try to avoid white rice, white bread, pasta (unless whole grain), white or yellow potatoes  EXERCISE GUIDELINES FOR ADULTS: -if you wish to increase your physical activity, do so gradually and with the approval of your doctor -STOP and seek medical care immediately if you have any chest pain, chest discomfort or trouble breathing when starting or increasing exercise  -move and stretch your body, legs, feet and arms when sitting for long periods -Physical activity guidelines for optimal health in adults: -least 150 minutes per week of aerobic exercise (can talk, but not sing) once approved by your doctor, 20-30 minutes of sustained activity or two 10 minute episodes of sustained activity every day.  -resistance training at least 2 days per week if approved by your doctor -balance  exercises 3+ days per week:   Stand somewhere where you have something sturdy to hold onto if you lose balance.    1) lift up on toes, start with 5x per day  and work up to 20x   2) stand and lift on leg straight out to the side so that foot is a few inches of the floor, start with 5x each side and work up to 20x each side   3) stand on one foot, start with 5 seconds each side and work up to 20 seconds on each side  If you need ideas or help with getting more active:  -Silver sneakers https://tools.silversneakers.com  -Walk with a Doc: http://www.duncan-williams.com/  -try to include resistance (weight lifting/strength building) and balance exercises twice per week: or the following link for ideas: http://castillo-powell.com/  BuyDucts.dk  STRESS MANAGEMENT: -can try meditating, or just sitting quietly with deep breathing while intentionally relaxing all parts of your body for 5 minutes daily -if you need further help with stress, anxiety or depression please follow up with your primary doctor or contact the wonderful folks at [facility]: [phone]  SOCIAL CONNECTIONS: -options in [place] if you wish to engage in more social and exercise related activities:  -Silver sneakers https://tools.silversneakers.com  -Walk with a Doc: http://www.duncan-williams.com/  -Check out the [facility] Active Adults 50+ section on the [place] [employer] (hiking clubs, book clubs, cards and games, chess, exercise classes, aquatic classes and much more) - see the website for details: https://www.[place].gov/departments/parks-recreation/active-adults50  -YouTube has lots of exercise videos for different ages and abilities as well  -[facility] (a variety of indoor and outdoor inperson activities for adults). [phone]. [address]  -Virtual Online Classes (a variety of topics): see seniorplanet.org or call [phone]  -consider volunteering at a school, hospice center, church, senior center or  elsewhere         ADVANCED HEALTHCARE DIRECTIVES:  Everyone should have advanced health care directives in place. This is so that you get the care you want, should you ever be in a situation where you are unable to make your own medical decisions.   From the [place] Advanced Directive Website: "Advance Health Care Directives are legal documents in which you give written instructions about your health care if, in the future, you cannot speak for yourself.   A health care power of attorney allows you to name a person you trust to make your health care decisions if you cannot make them yourself. A declaration of a desire for a natural death (or living will) is document, which states that you desire not to have your life prolonged by extraordinary measures if you have a terminal or incurable illness or if you are in a vegetative state. An advance instruction for mental health treatment makes a declaration of instructions, information and preferences regarding your mental health treatment. It also states that you are aware that the advance instruction authorizes a mental health treatment provider to act according to your wishes. It may also outline your consent or refusal of mental health treatment. A declaration of an anatomical gift allows anyone over the age of 77 to make a gift by will, organ donor card or other document."   Please see the following website or an elder law attorney for forms, FAQs and for completion of advanced directives: [place] Health Care Directives Advance Health Care Directives (http://guzman.com/)  Or copy and paste the following to your web browser: PoshChat.fi

## 2022-12-12 NOTE — Progress Notes (Signed)
PATIENT CHECK-IN and HEALTH RISK ASSESSMENT QUESTIONNAIRE:  -completed by phone/video for upcoming Medicare Preventive Visit  Pre-Visit Check-in: 1)Vitals (height, wt, BP, etc) - record in vitals section for visit on day of visit Request home vitals (wt, BP, etc.) and enter into vitals, THEN update Vital Signs SmartPhrase below at the top of the HPI. See below.  2)Review and Update Medications, Allergies PMH, Surgeries, Social history in Epic 3)Hospitalizations in the last year with date/reason?  No   4)Review and Update Care Team (patient's specialists) in Epic 5) Complete PHQ9 in Epic  6) Complete Fall Screening in Epic 7)Review all Health Maintenance Due and order under PCP if not done.  8)Medicare Wellness Questionnaire: Answer theses question about your habits: Do you drink alcohol? No   Have you ever smoked? No How many packs a day do/did you smoke?  Do you use smokeless tobacco? No  Do you use an illicit drugs? No Do you exercises?  Yes, walks twice/wk Are you sexually active?  No Number of partners? Typical breakfast: cereal or fruit, eggs Typical lunch sandwich  Typical dinner lean cuisine  Typical snacks: may some candy here and there, mostly fruit   Beverages: coffee, propel water,   Answer theses question about you: Can you perform most household chores? Not as much due to being lightheaded  Do you find it hard to follow a conversation in a noisy room?yes  Do you often ask people to speak up or repeat themselves? No  Do you feel that you have a problem with memory? Yes, sometimes   Do you balance your checkbook and or bank acounts? Yes  Do you feel safe at home? Yes  Last dentist visit? 12/20/22  Do you need assistance with any of the following: Please note if so  no   Driving?  Feeding yourself?  Getting from bed to chair?  Getting to the toilet?  Bathing or showering?  Dressing yourself?  Managing money?  Climbing a flight of stairs  Preparing meals?  Do  you have Advanced Directives in place (Living Will, Healthcare Power or Attorney)? No    Last eye Exam and location?just had one at [facility]    Do you currently use prescribed or non-prescribed narcotic or opioid pain medications?   Do you have a history or close family history of breast, ovarian, tubal or peritoneal cancer or a family member with BRCA (breast cancer susceptibility 1 and 2) gene mutations? Mother passed from breast cancer  Recorded, entered in vitals section. Request home vitals (wt, BP, etc.) and enter into vitals, THEN update Vital Signs SmartPhrase below at the top of the HPI. See below.   Nurse/Assistant Credentials/time stamp: Tora Perches -CMA 2:59 pm    ----------------------------------------------------------------------------------------------------------------------------------------------------------------------------------------------------------------------  Vital Signs: Vital signs are patient reported.   MEDICARE ANNUAL PREVENTIVE VISIT WITH PROVIDER: (Welcome to Medicare, initial annual wellness or annual wellness exam)  Virtual Visit via Phone Note  I connected with Jackqulyn Livings on 12/12/22 by phone and verified that I am speaking with the correct person using two identifiers.  Location patient: home Location provider:work or home office Persons participating in the virtual visit: patient, provider  Concerns and/or follow up today: chronic dizziness - has chronically low BP and psychiatry as adjusted her meds for the dizziness. She is very careful when stands up. She stopped her lyrica 2-3 weeks ago because had to go to [place] before picked up refill. Refill now on the way. Has been more anxious since running out.  Had diarrhea a few days ago, now better and eating well and feeling better today.    See HM section in Epic for other details of completed HM.    ROS: negative for report of fevers, unintentional weight loss, vision changes, vision  loss, hearing loss or change, chest pain, sob, hemoptysis, melena, hematochezia, hematuria, falls, bleeding or bruising, thoughts of suicide or self harm, memory loss  Patient-completed extensive health risk assessment - reviewed and discussed with the patient: See Health Risk Assessment completed with patient prior to the visit either above or in recent phone note. This was reviewed in detailed with the patient today and appropriate recommendations, orders and referrals were placed as needed per Summary below and patient instructions.   Review of Medical History: -PMH, PSH, Family History and current specialty and care providers reviewed and updated and listed below   Patient Care Team: Shirline Frees, NP as PCP - General (Family Medicine) Lupita Raider, MD (Family Medicine) Verner Chol, [facility] (Inactive) as Pharmacist (Pharmacist)   Past Medical History:  Diagnosis Date   ADHD    Allergy    Anemia    past hx    Anxiety    Barrett esophagus    Breast nodule    benign   Cervical arthritis 09/22/2013   Depression    Elevated liver enzymes    Fatty liver    Fibromyalgia    Fibromyalgia    GERD (gastroesophageal reflux disease)    H/O hiatal hernia    Headache(784.0)    migraines   High cholesterol    History of UTI    Lymphocytic colitis    Migraines    Pain    Panic attack    Paraesophageal hiatal hernia    repaired 2009   Spinal stenosis    Thyroid disease    Thyroid disease    Vertigo     Past Surgical History:  Procedure Laterality Date   ANKLE SURGERY     CHOLECYSTECTOMY  1980s?   COLONOSCOPY  2010   Buccini   EUS N/A 11/19/2013   Procedure: ESOPHAGEAL ENDOSCOPIC ULTRASOUND (EUS) RADIAL;  Surgeon: Willis Modena, MD;  Location: [facility];  Service: Endoscopy;  Laterality: N/A;   LAPAROSCOPIC NISSEN FUNDOPLICATION  03/05/2008   LAPAROSCOPIC PARAESOPHAGEAL HERNIA REPAIR  03/05/2008   Dr Michaell Cowing   TUBAL LIGATION  1990s?   UPPER GASTROINTESTINAL  ENDOSCOPY  04/05/2018   Yancey Flemings    VAGINAL HYSTERECTOMY  03/04/2001    Social History   Socioeconomic History   Marital status: Married    Spouse name: Therapist, nutritional   Number of children: 3   Years of education: Not on file   Highest education level: Not on file  Occupational History   Occupation: disabled  Tobacco Use   Smoking status: Never   Smokeless tobacco: Never  Vaping Use   Vaping status: Never Used  Substance and Sexual Activity   Alcohol use: Not Currently    Comment: wine occassionally   Drug use: No   Sexual activity: Yes    Birth control/protection: None    Comment: n/a partial hysterectomy  Other Topics Concern   Not on file  Social History Narrative   Married    Lived in [place].  Moved to [place] in early 2000s   She is on disability for anxiety    Social Determinants of Health   Financial Resource Strain: Low Risk  (12/21/2021)   Overall Financial Resource Strain (CARDIA)    Difficulty of Paying  Living Expenses: Not hard at all  Food Insecurity: No Food Insecurity (12/07/2021)   Hunger Vital Sign    Worried About Running Out of Food in the Last Year: Never true    Ran Out of Food in the Last Year: Never true  Transportation Needs: No Transportation Needs (12/21/2021)   PRAPARE - Administrator, Civil Service (Medical): No    Lack of Transportation (Non-Medical): No  Physical Activity: Inactive (12/07/2021)   Exercise Vital Sign    Days of Exercise per Week: 0 days    Minutes of Exercise per Session: 0 min  Stress: Stress Concern Present (12/07/2021)   [employer] of Occupational Health - Occupational Stress Questionnaire    Feeling of Stress : Rather much  Social Connections: Moderately Isolated (12/07/2021)   Social Connection and Isolation Panel [NHANES]    Frequency of Communication with Friends and Family: More than three times a week    Frequency of Social Gatherings with Friends and Family: More than three times a week     Attends Religious Services: Never    Database administrator or Organizations: No    Attends Banker Meetings: Never    Marital Status: Married  Catering manager Violence: Not At Risk (12/07/2021)   Humiliation, Afraid, Rape, and Kick questionnaire    Fear of Current or Ex-Partner: No    Emotionally Abused: No    Physically Abused: No    Sexually Abused: No    Family History  Problem Relation Age of Onset   Alcohol abuse Other    Elevated Lipids Other    Heart disease Other    Anxiety disorder Mother    Obesity Mother    High Cholesterol Mother    Thyroid cancer Mother    Depression Mother    Breast cancer Mother    Bone cancer Mother    High Cholesterol Father    Heart disease Father    Alcoholism Father    Colon cancer Maternal Aunt    Colon polyps Sister    Stomach cancer Neg Hx    Esophageal cancer Neg Hx    Rectal cancer Neg Hx     Current Outpatient Medications on File Prior to Visit  Medication Sig Dispense Refill   ALPRAZolam (XANAX) 1 MG tablet TAKE 1 TABLET BY MOUTH FIVE TIMES DAILY FOR ANXIETY AND  PANIC  ATTACKS 150 tablet 2   atorvastatin (LIPITOR) 40 MG tablet TAKE 1 TABLET EVERY DAY (APPOINTMENT IS NEEDED FOR REFILLS) 15 tablet 0   levothyroxine (SYNTHROID) 25 MCG tablet TAKE 1 TABLET EVERY DAY BEFORE BREAKFAST (NEED MD APPOINTMENT) 90 tablet 3   meclizine (ANTIVERT) 25 MG tablet Take 1 tablet (25 mg total) by mouth 3 (three) times daily as needed for dizziness. 30 tablet 1   Multiple Vitamin (MULTIVITAMIN ADULT PO) Take 1 tablet by mouth daily.     omega-3 acid ethyl esters (LOVAZA) 1 G capsule Take 1 g by mouth 2 (two) times daily.     omeprazole (PRILOSEC) 40 MG capsule Take 1 capsule (40 mg total) by mouth daily. 90 capsule 3   OVER THE COUNTER MEDICATION Take 1 tablet by mouth daily. Fiber Well     pregabalin (LYRICA) 75 MG capsule TAKE 1 CAPSULE TWICE DAILY 60 capsule 2   QUEtiapine (SEROQUEL) 400 MG tablet TAKE 2 TABLETS AT BEDTIME 180  tablet 3   Semaglutide, 1 MG/DOSE, (OZEMPIC, 1 MG/DOSE,) 4 MG/3ML SOPN INJECT 1MG  UNDER THE SKIN ONE TIME  WEEKLY 6 mL 2   venlafaxine XR (EFFEXOR-XR) 75 MG 24 hr capsule TAKE 3 CAPSULES EVERY DAY 270 capsule 3   omeprazole (PRILOSEC) 20 MG capsule Take 1 capsule (20 mg total) by mouth daily. (Patient not taking: Reported on 12/12/2022) 90 capsule 3   No current facility-administered medications on file prior to visit.    Allergies  Allergen Reactions   Codeine Nausea And Vomiting   Compazine [Prochlorperazine Edisylate]     "feels weird"   Sulfa Antibiotics     Unknown reaction.       Physical Exam Vitals requested from patient and listed below if patient had equipment and was able to obtain at home for this virtual visit: Vitals:   12/12/22 1437  BP: (!) 111/59  Pulse: (!) 105  Temp: (!) 97.5 F (36.4 C)  She reports her BP is always low and that she gets nervous for visits and pulse runs up. Reports chronic severe anxiety.  Estimated body mass index is 30.73 kg/m as calculated from the following:   Height as of 08/09/22: 5\' 4"  (1.626 m).   Weight as of this encounter: 179 lb (81.2 kg).  EKG (optional): deferred due to virtual visit  GENERAL: alert, oriented, no acute distress detected, full vision exam deferred due to pandemic and/or virtual encounter  PSYCH/NEURO: pleasant and cooperative, no obvious depression or anxiety, speech and thought processing grossly intact, Cognitive function grossly intact  Flowsheet Row Office Visit from 08/09/2022 in [facility] [place]  PHQ-9 Total Score 6           08/09/2022    3:54 PM 04/28/2022    1:03 PM 12/07/2021    3:11 PM 07/27/2021    3:13 PM 11/30/2020    3:24 PM  Depression screen PHQ 2/9  Decreased Interest 1 2 0 3 0  Down, Depressed, Hopeless 1 1 3 3 1   PHQ - 2 Score 2 3 3 6 1   Altered sleeping 1 1 1  0   Tired, decreased energy 1 1 1 2    Change in appetite 1 0 0 0   Feeling bad or failure about  yourself  1 1 2  0   Trouble concentrating 0 0 0 1   Moving slowly or fidgety/restless 0 1 0 0   Suicidal thoughts 0 0 1 0   PHQ-9 Score 6 7 8 9    Difficult doing work/chores Not difficult at all Not difficult at all Somewhat difficult Not difficult at all        12/02/2020    8:00 PM 12/03/2020   10:00 AM 12/03/2020    8:00 PM 12/04/2020    9:51 AM 12/07/2021    3:32 PM  Fall Risk  Falls in the past year?     1  Was there an injury with Fall?     0  Was there an injury with Fall? - Comments     Scraped knee. No medical attention needed  Fall Risk Category Calculator     1  Fall Risk Category (Retired)     Low  (RETIRED) Patient Fall Risk Level High fall risk High fall risk High fall risk Moderate fall risk Low fall risk  Patient at Risk for Falls Due to     No Fall Risks     SUMMARY AND PLAN:  Encounter for Medicare annual wellness exam   Discussed applicable health maintenance/preventive health measures and advised and referred or ordered per patient preferences: -discussed the vaccines due  and she plans to get at the pharmacy  Health Maintenance  Topic Date Due   COVID-19 Vaccine (4 - 2023-24 season) 12/23/2021   INFLUENZA VACCINE  11/23/2022   Diabetic kidney evaluation - Urine ACR  04/29/2027 (Originally 01/24/1979)   Diabetic kidney evaluation - eGFR measurement  05/13/2023   MAMMOGRAM  07/06/2023   Medicare Annual Wellness (AWV)  12/12/2023   Colonoscopy  04/16/2026   DTaP/Tdap/Td (3 - Td or Tdap) 11/20/2031   Hepatitis C Screening  Completed   HIV Screening  Completed   Zoster Vaccines- Shingrix  Completed   HPV VACCINES  Aged Out   FOOT EXAM  Discontinued   HEMOGLOBIN A1C  Discontinued   OPHTHALMOLOGY EXAM  Discontinued   Education and counseling on the following was provided based on the above review of health and a plan/checklist for the patient, along with additional information discussed, was provided for the patient in the patient instructions :  -Advised  on importance of completing advanced directives, discussed options for completing and provided information in patient instructions as well -Provided counseling and plan for increased risk of falling if applicable per above screening. Reviewed and demonstrated safe balance exercises that can be done at home to improve balance and discussed exercise guidelines for adults with include balance exercises at least 3 days per week. Discussed caution with standing and position changes given hx chronically low BP. Advised further discussion with her psychiatrist about the chronic dizziness and inperson visit with PCP if any worse than usual, change or concerns. Advised she also let her doctors know whenever she runs out of medication. Reports she did call and refill of lyrica is on its way.  -Advised and counseled on a healthy lifestyle - including the importance of a healthy diet, regular physical activity, social connections and stress management. -Reviewed patient's current diet. Advised and counseled on a whole foods based healthy diet. A summary of a healthy diet was provided in the Patient Instructions.  -reviewed patient's current physical activity level and discussed exercise guidelines for adults. Discussed community resources and ideas for safe exercise at home to assist in meeting exercise guideline recommendations in a safe and healthy way.  -Advise yearly dental visits at minimum and regular eye exams   Follow up: see patient instructions     Patient Instructions  I really enjoyed getting to talk with you today! I am available on Tuesdays and Thursdays for virtual visits if you have any questions or concerns, or if I can be of any further assistance.   CHECKLIST FROM ANNUAL WELLNESS VISIT:  -Follow up (please call to schedule if not scheduled after visit):   -yearly for annual wellness visit with primary care office  Here is a list of your preventive care/health maintenance measures and the  plan for each if any are due:  PLAN For any measures below that may be due:   Health Maintenance  Topic Date Due   COVID-19 Vaccine (4 - 2023-24 season) 12/23/2021   INFLUENZA VACCINE  11/23/2022   Diabetic kidney evaluation - Urine ACR  04/29/2027 (Originally 01/24/1979)   Diabetic kidney evaluation - eGFR measurement  05/13/2023   MAMMOGRAM  07/06/2023   Medicare Annual Wellness (AWV)  12/12/2023   Colonoscopy  04/16/2026   DTaP/Tdap/Td (3 - Td or Tdap) 11/20/2031   Hepatitis C Screening  Completed   HIV Screening  Completed   Zoster Vaccines- Shingrix  Completed   HPV VACCINES  Aged Out   FOOT EXAM  Discontinued  HEMOGLOBIN A1C  Discontinued   OPHTHALMOLOGY EXAM  Discontinued    -See a dentist at least yearly  -Get your eyes checked and then per your eye specialist's recommendations  -Other issues addressed today:   -I have included below further information regarding a healthy whole foods based diet, physical activity guidelines for adults, stress management and opportunities for social connections. I hope you find this information useful.   -----------------------------------------------------------------------------------------------------------------------------------------------------------------------------------------------------------------------------------------------------------  NUTRITION: -eat real food: lots of colorful vegetables (half the plate) and fruits -5-7 servings of vegetables and fruits per day (fresh or steamed is best), exp. 2 servings of vegetables with lunch and dinner and 2 servings of fruit per day. Berries and greens such as kale and collards are great choices.  -consume on a regular basis: whole grains (make sure first ingredient on label contains the word "whole"), fresh fruits, fish, nuts, seeds, healthy oils (such as olive oil, avocado oil, grape seed oil) -may eat small amounts of dairy and lean meat on occasion, but avoid processed meats  such as ham, bacon, lunch meat, etc. -drink water -try to avoid fast food and pre-packaged foods, processed meat -most experts advise limiting sodium to < 2300mg  per day, should limit further is any chronic conditions such as high blood pressure, heart disease, diabetes, etc. The American Heart Association advised that < 1500mg  is is ideal -try to avoid foods that contain any ingredients with names you do not recognize  -try to avoid sugar/sweets (except for the natural sugar that occurs in fresh fruit) -try to avoid sweet drinks -try to avoid white rice, white bread, pasta (unless whole grain), white or yellow potatoes  EXERCISE GUIDELINES FOR ADULTS: -if you wish to increase your physical activity, do so gradually and with the approval of your doctor -STOP and seek medical care immediately if you have any chest pain, chest discomfort or trouble breathing when starting or increasing exercise  -move and stretch your body, legs, feet and arms when sitting for long periods -Physical activity guidelines for optimal health in adults: -least 150 minutes per week of aerobic exercise (can talk, but not sing) once approved by your doctor, 20-30 minutes of sustained activity or two 10 minute episodes of sustained activity every day.  -resistance training at least 2 days per week if approved by your doctor -balance exercises 3+ days per week:   Stand somewhere where you have something sturdy to hold onto if you lose balance.    1) lift up on toes, start with 5x per day and work up to 20x   2) stand and lift on leg straight out to the side so that foot is a few inches of the floor, start with 5x each side and work up to 20x each side   3) stand on one foot, start with 5 seconds each side and work up to 20 seconds on each side  If you need ideas or help with getting more active:  -Silver sneakers https://tools.silversneakers.com  -Walk with a Doc: http://www.duncan-williams.com/  -try to include  resistance (weight lifting/strength building) and balance exercises twice per week: or the following link for ideas: http://castillo-powell.com/  BuyDucts.dk  STRESS MANAGEMENT: -can try meditating, or just sitting quietly with deep breathing while intentionally relaxing all parts of your body for 5 minutes daily -if you need further help with stress, anxiety or depression please follow up with your primary doctor or contact the wonderful folks at [facility]: [phone]  SOCIAL CONNECTIONS: -options in [place] if you wish  to engage in more social and exercise related activities:  -Silver sneakers https://tools.silversneakers.com  -Walk with a Doc: http://www.duncan-williams.com/  -Check out the [facility] Active Adults 50+ section on the [place] of [employer] (hiking clubs, book clubs, cards and games, chess, exercise classes, aquatic classes and much more) - see the website for details: https://www.[place].gov/departments/parks-recreation/active-adults50  -YouTube has lots of exercise videos for different ages and abilities as well  -[facility] (a variety of indoor and outdoor inperson activities for adults). [phone]. [address]  -Virtual Online Classes (a variety of topics): see seniorplanet.org or call 418-620-5605  -consider volunteering at a school, hospice center, church, senior center or elsewhere         ADVANCED HEALTHCARE DIRECTIVES:  Everyone should have advanced health care directives in place. This is so that you get the care you want, should you ever be in a situation where you are unable to make your own medical decisions.   From the [place] Advanced Directive Website: "Advance Health Care Directives are legal documents in which you give written instructions about your health care if, in the future, you cannot speak for  yourself.   A health care power of attorney allows you to name a person you trust to make your health care decisions if you cannot make them yourself. A declaration of a desire for a natural death (or living will) is document, which states that you desire not to have your life prolonged by extraordinary measures if you have a terminal or incurable illness or if you are in a vegetative state. An advance instruction for mental health treatment makes a declaration of instructions, information and preferences regarding your mental health treatment. It also states that you are aware that the advance instruction authorizes a mental health treatment provider to act according to your wishes. It may also outline your consent or refusal of mental health treatment. A declaration of an anatomical gift allows anyone over the age of 74 to make a gift by will, organ donor card or other document."   Please see the following website or an elder law attorney for forms, FAQs and for completion of advanced directives: [place] TEFL teacher Health Care Directives Advance Health Care Directives (http://guzman.com/)  Or copy and paste the following to your web browser: PoshChat.fi    Terressa Koyanagi, DO

## 2022-12-12 NOTE — Telephone Encounter (Signed)
While rooming pt for AWV with Dr. Selena Batten, pt asked to let PCP know that she is experiencing Leslie Benson as she is still waiting for pregablin.

## 2022-12-15 ENCOUNTER — Telehealth: Payer: Self-pay | Admitting: Adult Health

## 2022-12-15 ENCOUNTER — Encounter: Payer: Self-pay | Admitting: Cardiology

## 2022-12-15 NOTE — Telephone Encounter (Signed)
I show patient has a RF, she said [facility] told her she didn't. Called [facility] and it was shipped today. Patient notified.

## 2022-12-15 NOTE — Telephone Encounter (Signed)
Pt called back to say she is still waiting for [facility] to send her pills. Pt has been out for 2 wks now and is not doing well.   Pt is asking if NP could please send, even just a few pills to the [facility] [address] [place], [place] - [address] (Ph: [phone])    Also, she says she is only getting 15 pills of the Atorvastatin.

## 2022-12-15 NOTE — Addendum Note (Signed)
Addended by: Waymon Amato R on: 12/15/2022 04:10 PM   Modules accepted: Orders

## 2022-12-15 NOTE — Telephone Encounter (Signed)
Pt called  and asked for a refill on her xanax 1 mg. Pharmacy is [facility]  mail order

## 2022-12-15 NOTE — Telephone Encounter (Signed)
Spoke to pt and advised that she should have another refill coming in. Pt stated that [facility] said that they will not refill the medication. Pt stated that her insurance has changed and she doesn't know what happened after that.

## 2022-12-15 NOTE — Telephone Encounter (Signed)
Patient notified of update  and verbalized understanding. 

## 2022-12-15 NOTE — Telephone Encounter (Signed)
Error

## 2022-12-15 NOTE — Telephone Encounter (Signed)
Ok to send to [facility]

## 2022-12-22 ENCOUNTER — Other Ambulatory Visit: Payer: Self-pay

## 2022-12-22 MED ORDER — PREGABALIN 75 MG PO CAPS: 75.0000 mg | ORAL_CAPSULE | Freq: Two times a day (BID) | ORAL | 2 refills | Status: DC

## 2023-01-08 ENCOUNTER — Other Ambulatory Visit: Payer: Self-pay | Admitting: Cardiology

## 2023-01-12 ENCOUNTER — Encounter: Payer: Self-pay | Admitting: Adult Health

## 2023-01-12 ENCOUNTER — Ambulatory Visit: Payer: Medicare HMO | Admitting: Adult Health

## 2023-01-12 DIAGNOSIS — F411 Generalized anxiety disorder: Secondary | ICD-10-CM

## 2023-01-12 DIAGNOSIS — F410 Panic disorder [episodic paroxysmal anxiety] without agoraphobia: Secondary | ICD-10-CM

## 2023-01-12 DIAGNOSIS — F331 Major depressive disorder, recurrent, moderate: Secondary | ICD-10-CM

## 2023-01-12 DIAGNOSIS — G4700 Insomnia, unspecified: Secondary | ICD-10-CM

## 2023-01-12 MED ORDER — ALPRAZOLAM 1 MG PO TABS
ORAL_TABLET | ORAL | 2 refills | Status: DC
Start: 2023-01-12 — End: 2023-04-10

## 2023-01-12 NOTE — Progress Notes (Signed)
Leslie Benson [id number] 1960-09-21 62 y.o.  Subjective:   Patient ID:  Leslie Benson is a 61 y.o. (DOB 08/06/1960) female.  Chief Complaint: No chief complaint on file.   HPI Leslie Benson presents to the office today for follow-up of GAD, MDD, panic attacks and insomnia.  Describes mood today as "ok". Pleasant. Tearful at times. Mood symptoms - reports some depression and irritability. Reports anxiety "all the time". Reports panic attacks. Reports some worry, rumination and over thinking. Mood is lower. Stating "I feel like I'm doing ok". Reports chronic pain issues. Husband retired. Feels like medications are helpful. Varying interest and motivation. Taking medications as prescribed.  Energy levels lower. Active, does not have a regular exercise routine.   Enjoys some usual interests and activities. Married. Lives with husband. Has 3 grown daughters - 2 grandchildren. Spending time with family. Talking to sisters. Appetite adequate. Weight stable - 184 pounds. Sleeps well most nights. Averages 6 hours with Seroquel.   Focus and concentration difficulties. Completing tasks. Managing aspects of household. Receives SSI disability benefits for Fibromyalgia - 20 years.  Denies SI or HI.  Denies AH or VH. Denies self harm. Denies substance use.  Previous medication trials: Unknown   PHQ2-9    Flowsheet Row Office Visit from 08/09/2022 in [facility] [place] Office Visit from 04/28/2022 in [facility] [place] Office Visit from 12/07/2021 in [facility] [place] Video Visit from 07/27/2021 in [facility] [place] Clinical Support from 11/30/2020 in [facility] [place]  PHQ-2 Total Score 2 3 3 6 1   PHQ-9 Total Score 6 7 8 9  --      Flowsheet Row ED from 05/12/2022 in [facility] Emergency Department at [facility] Office Visit from 12/07/2021 in [facility] [place] ED to Hosp-Admission (Discharged) from 12/01/2020 in [facility] [place] [facility]  C-SSRS RISK CATEGORY No Risk Low Risk No Risk        Review of Systems:  Review of Systems  Musculoskeletal:  Negative for gait problem.  Neurological:  Negative for tremors.  Psychiatric/Behavioral:         Please refer to HPI    Medications: I have reviewed the patient's current medications.  Current Outpatient Medications  Medication Sig Dispense Refill   ALPRAZolam (XANAX) 1 MG tablet TAKE 1 TABLET BY MOUTH FIVE TIMES DAILY FOR ANXIETY AND  PANIC  ATTACKS 150 tablet 2   atorvastatin (LIPITOR) 40 MG tablet TAKE 1 TABLET EVERY DAY 90 tablet 0   levothyroxine (SYNTHROID) 25 MCG tablet TAKE 1 TABLET EVERY DAY BEFORE BREAKFAST (NEED MD APPOINTMENT) 90 tablet 3   meclizine (ANTIVERT) 25 MG tablet Take 1 tablet (25 mg total) by mouth 3 (three) times daily as needed for dizziness. 30 tablet 1   Multiple Vitamin (MULTIVITAMIN ADULT PO) Take 1 tablet by mouth daily.     omega-3 acid ethyl esters (LOVAZA) 1 G capsule Take 1 g by mouth 2 (two) times daily.     omeprazole (PRILOSEC) 20 MG capsule Take 1 capsule (20 mg total) by mouth daily. (Patient not taking: Reported on 12/12/2022) 90 capsule 3   omeprazole (PRILOSEC) 40 MG capsule Take 1 capsule (40 mg total) by mouth daily. 90 capsule 3   OVER THE COUNTER MEDICATION Take 1 tablet by mouth daily. Fiber Well     pregabalin (LYRICA) 75 MG capsule Take 1 capsule (75 mg total)  by mouth 2 (two) times daily. 60 capsule 2   QUEtiapine (SEROQUEL) 400 MG tablet TAKE 2 TABLETS AT BEDTIME 180 tablet 3   Semaglutide, 1 MG/DOSE, (OZEMPIC, 1 MG/DOSE,) 4 MG/3ML SOPN INJECT 1MG  UNDER THE SKIN ONE TIME WEEKLY 6 mL 2   venlafaxine XR (EFFEXOR-XR) 75 MG 24 hr capsule TAKE 3 CAPSULES EVERY DAY 270 capsule 3   No current facility-administered medications for this visit.    Medication Side Effects: None  Allergies:  Allergies  Allergen  Reactions   Codeine Nausea And Vomiting   Compazine [Prochlorperazine Edisylate]     "feels weird"   Sulfa Antibiotics     Unknown reaction.    Past Medical History:  Diagnosis Date   ADHD    Allergy    Anemia    past hx    Anxiety    Barrett esophagus    Breast nodule    benign   Cervical arthritis 09/22/2013   Depression    Elevated liver enzymes    Fatty liver    Fibromyalgia    Fibromyalgia    GERD (gastroesophageal reflux disease)    H/O hiatal hernia    Headache(784.0)    migraines   High cholesterol    History of UTI    Lymphocytic colitis    Migraines    Pain    Panic attack    Paraesophageal hiatal hernia    repaired 2009   Spinal stenosis    Thyroid disease    Thyroid disease    Vertigo     Past Medical History, Surgical history, Social history, and Family history were reviewed and updated as appropriate.   Please see review of systems for further details on the patient's review from today.   Objective:   Physical Exam:  There were no vitals taken for this visit.  Physical Exam Constitutional:      General: She is not in acute distress. Musculoskeletal:        General: No deformity.  Neurological:     Mental Status: She is alert and oriented to person, place, and time.     Coordination: Coordination normal.  Psychiatric:        Attention and Perception: Attention and perception normal. She does not perceive auditory or visual hallucinations.        Mood and Affect: Mood normal. Mood is not anxious or depressed. Affect is not labile, blunt, angry or inappropriate.        Speech: Speech normal.        Behavior: Behavior normal.        Thought Content: Thought content normal. Thought content is not paranoid or delusional. Thought content does not include homicidal or suicidal ideation. Thought content does not include homicidal or suicidal plan.        Cognition and Memory: Cognition and memory normal.        Judgment: Judgment normal.      Comments: Insight intact     Lab Review:     Component Value Date/Time   NA 136 05/12/2022 2211   NA 141 10/01/2019 1323   K 3.7 05/12/2022 2211   CL 101 05/12/2022 2211   CO2 27 05/12/2022 2211   GLUCOSE 93 05/12/2022 2211   BUN 11 05/12/2022 2211   BUN 10 10/01/2019 1323   CREATININE 0.93 05/12/2022 2211   CALCIUM 8.7 (L) 05/12/2022 2211   PROT 7.0 05/12/2022 2211   PROT 7.2 09/25/2018 1125   ALBUMIN 4.1 05/12/2022 2211  ALBUMIN 4.8 09/25/2018 1125   AST 38 05/12/2022 2211   ALT 46 (H) 05/12/2022 2211   ALKPHOS 100 05/12/2022 2211   BILITOT 0.3 05/12/2022 2211   BILITOT 0.3 09/25/2018 1125   GFRNONAA >60 05/12/2022 2211   GFRAA 67 10/01/2019 1323       Component Value Date/Time   WBC 5.8 05/12/2022 2211   RBC 3.94 05/12/2022 2211   HGB 12.2 05/12/2022 2211   HCT 37.9 05/12/2022 2211   PLT 196 05/12/2022 2211   MCV 96.2 05/12/2022 2211   MCH 31.0 05/12/2022 2211   MCHC 32.2 05/12/2022 2211   RDW 13.2 05/12/2022 2211   LYMPHSABS 2.4 05/12/2022 2211   MONOABS 0.5 05/12/2022 2211   EOSABS 0.1 05/12/2022 2211   BASOSABS 0.0 05/12/2022 2211    No results found for: "POCLITH", "LITHIUM"   No results found for: "PHENYTOIN", "PHENOBARB", "VALPROATE", "CBMZ"   .res Assessment: Plan:    Plan:  PDMP reviewed  1. Decrease Xanax 1mg  - 5 times daily - discussed continuing taper, but patient is struggling with increase anxiety. 2. Seroquel 400mg  - 2 at hs  3. Effexor XR 75mg  TID  RTC 6 months - will call in 3 months for next Xanax prescription.  Patient advised to contact office with any questions, adverse effects, or acute worsening in signs and symptoms.  Discussed potential benefits, risk, and side effects of benzodiazepines to include potential risk of tolerance and dependence, as well as possible drowsiness.  Advised patient not to drive if experiencing drowsiness and to take lowest possible effective dose to minimize risk of dependence and  tolerance.  Discussed potential metabolic side effects associated with atypical antipsychotics, as well as potential risk for movement side effects. Advised pt to contact office if movement side effects occur.   There are no diagnoses linked to this encounter.   Please see After Visit Summary for patient specific instructions.  Future Appointments  Date Time Provider Department Center  01/12/2023  4:20 PM Trei Schoch, Thereasa Solo, NP [facility] None  03/12/2023  3:35 PM Perlie Gold, PA-C [facility]    No orders of the defined types were placed in this encounter.   -------------------------------

## 2023-02-12 ENCOUNTER — Encounter: Payer: Self-pay | Admitting: Family Medicine

## 2023-02-12 ENCOUNTER — Ambulatory Visit (INDEPENDENT_AMBULATORY_CARE_PROVIDER_SITE_OTHER): Payer: Medicare HMO | Admitting: Family Medicine

## 2023-02-12 VITALS — BP 126/82 | HR 90 | Temp 98.1°F | Ht 64.0 in | Wt 187.8 lb

## 2023-02-12 DIAGNOSIS — Z0189 Encounter for other specified special examinations: Secondary | ICD-10-CM | POA: Diagnosis not present

## 2023-02-12 LAB — BASIC METABOLIC PANEL
BUN: 11 mg/dL (ref 6–23)
CO2: 30 meq/L (ref 19–32)
Calcium: 9.6 mg/dL (ref 8.4–10.5)
Chloride: 101 meq/L (ref 96–112)
Creatinine, Ser: 1.06 mg/dL (ref 0.40–1.20)
GFR: 55.7 mL/min — ABNORMAL LOW (ref >60.00)
Glucose, Bld: 79 mg/dL (ref 70–99)
Potassium: 4.3 meq/L (ref 3.5–5.1)
Sodium: 139 meq/L (ref 135–145)

## 2023-02-12 NOTE — Progress Notes (Signed)
Established Patient Office Visit   Subjective  Patient ID: Leslie Benson, female    DOB: 03-May-1960  Age: 62 y.o. MRN: [id number]  Chief Complaint  Patient presents with   kidney function     Patient is a 62 year old female followed by Shirline Frees, NP and seen for labs.  Patient states she received a message from [facility] regarding having kidney function checked.  Patient no longer has the email.  Unsure if needed UA versus blood draw.  Had [facility] January 2024.  Patient states she is unsure if the request was due to being on Ozempic for weight loss.  Patient states she has gained weight since being on medication for several months.  Has difficulty exercising 2/2 history of fibromyalgia and low back pain.    Patient Active Problem List   Diagnosis Date Noted   Coronary artery calcification 06/28/2021   Orthostatic hypotension 12/04/2020   Vertigo 12/02/2020   Dizziness of unknown cause 12/02/2020   GERD (gastroesophageal reflux disease)    Anxiety    Other hyperlipidemia 01/23/2018   Class 1 obesity with serious comorbidity and body mass index (BMI) of 30.0 to 30.9 in adult 01/23/2018   Hypothyroidism 11/25/2014   Adynamic ileus (HCC) 09/22/2013   Obesity (BMI 30-39.9) 09/22/2013   Abdominal pain, epigastric 09/22/2013   Transaminitis 09/22/2013   Cervical arthritis 09/22/2013   Chronic constipation 09/22/2013   Fibromyalgia    Past Medical History:  Diagnosis Date   ADHD    Allergy    Anemia    past hx    Anxiety    Barrett esophagus    Breast nodule    benign   Cervical arthritis 09/22/2013   Depression    Elevated liver enzymes    Fatty liver    Fibromyalgia    Fibromyalgia    GERD (gastroesophageal reflux disease)    H/O hiatal hernia    Headache(784.0)    migraines   High cholesterol    History of UTI    Lymphocytic colitis    Migraines    Pain    Panic attack    Paraesophageal hiatal hernia    repaired 2009   Spinal stenosis    Thyroid disease     Thyroid disease    Vertigo    Past Surgical History:  Procedure Laterality Date   ANKLE SURGERY     CHOLECYSTECTOMY  1980s?   COLONOSCOPY  2010   Buccini   EUS N/A 11/19/2013   Procedure: ESOPHAGEAL ENDOSCOPIC ULTRASOUND (EUS) RADIAL;  Surgeon: Willis Modena, MD;  Location: [facility];  Service: Endoscopy;  Laterality: N/A;   LAPAROSCOPIC NISSEN FUNDOPLICATION  03/05/2008   LAPAROSCOPIC PARAESOPHAGEAL HERNIA REPAIR  03/05/2008   Dr Michaell Cowing   TUBAL LIGATION  1990s?   UPPER GASTROINTESTINAL ENDOSCOPY  04/05/2018   Yancey Flemings    VAGINAL HYSTERECTOMY  03/04/2001   Social History   Tobacco Use   Smoking status: Never   Smokeless tobacco: Never  Vaping Use   Vaping status: Never Used  Substance Use Topics   Alcohol use: Not Currently    Comment: wine occassionally   Drug use: No   Family History  Problem Relation Age of Onset   Alcohol abuse Other    Elevated Lipids Other    Heart disease Other    Anxiety disorder Mother    Obesity Mother    High Cholesterol Mother    Thyroid cancer Mother    Depression Mother    Breast cancer Mother  Bone cancer Mother    High Cholesterol Father    Heart disease Father    Alcoholism Father    Colon cancer Maternal Aunt    Colon polyps Sister    Stomach cancer Neg Hx    Esophageal cancer Neg Hx    Rectal cancer Neg Hx    Allergies  Allergen Reactions   Codeine Nausea And Vomiting   Compazine [Prochlorperazine Edisylate]     "feels weird"   Sulfa Antibiotics     Unknown reaction.      ROS Negative unless stated above    Objective:     BP 126/82 (BP Location: Right Arm, Patient Position: Sitting, Cuff Size: Large)   Pulse 90   Temp 98.1 F (36.7 C) (Oral)   Ht 5\' 4"  (1.626 m)   Wt 187 lb 12.8 oz (85.2 kg)   SpO2 97%   BMI 32.24 kg/m  BP Readings from Last 3 Encounters:  02/12/23 126/82  12/12/22 (!) 111/59  08/09/22 98/60   Wt Readings from Last 3 Encounters:  02/12/23 187 lb 12.8 oz (85.2 kg)   12/12/22 179 lb (81.2 kg)  08/09/22 184 lb (83.5 kg)      Physical Exam Constitutional:      Appearance: Normal appearance.  HENT:     Head: Normocephalic and atraumatic.     Nose: Nose normal.     Mouth/Throat:     Mouth: Mucous membranes are moist.  Eyes:     Extraocular Movements: Extraocular movements intact.     Conjunctiva/sclera: Conjunctivae normal.     Pupils: Pupils are equal, round, and reactive to light.  Cardiovascular:     Rate and Rhythm: Normal rate.  Pulmonary:     Effort: Pulmonary effort is normal.  Skin:    General: Skin is warm and dry.  Neurological:     Mental Status: She is alert and oriented to person, place, and time.      No results found for any visits on 02/12/23.    Assessment & Plan:  Patient requested diagnostic testing -     Basic metabolic panel  Renal function check requested by [facility]?  BMP ordered.  Return if symptoms worsen or fail to improve.   Deeann Saint, MD

## 2023-03-12 ENCOUNTER — Ambulatory Visit: Payer: Medicare HMO | Admitting: Cardiology

## 2023-03-23 ENCOUNTER — Other Ambulatory Visit: Payer: Self-pay | Admitting: Cardiology

## 2023-03-28 ENCOUNTER — Other Ambulatory Visit: Payer: Self-pay | Admitting: Adult Health

## 2023-03-28 DIAGNOSIS — F410 Panic disorder [episodic paroxysmal anxiety] without agoraphobia: Secondary | ICD-10-CM

## 2023-03-28 DIAGNOSIS — G4700 Insomnia, unspecified: Secondary | ICD-10-CM

## 2023-04-05 ENCOUNTER — Other Ambulatory Visit: Payer: Self-pay | Admitting: Cardiology

## 2023-04-10 ENCOUNTER — Other Ambulatory Visit: Payer: Self-pay | Admitting: Adult Health

## 2023-04-10 DIAGNOSIS — F410 Panic disorder [episodic paroxysmal anxiety] without agoraphobia: Secondary | ICD-10-CM

## 2023-04-10 DIAGNOSIS — G4700 Insomnia, unspecified: Secondary | ICD-10-CM

## 2023-04-10 NOTE — Telephone Encounter (Signed)
LF 11/19 LV 09/20 NV 3/20

## 2023-04-10 NOTE — Telephone Encounter (Signed)
Okay for refill?  

## 2023-04-13 ENCOUNTER — Other Ambulatory Visit: Payer: Self-pay | Admitting: Adult Health

## 2023-04-13 DIAGNOSIS — E669 Obesity, unspecified: Secondary | ICD-10-CM

## 2023-04-13 DIAGNOSIS — R7303 Prediabetes: Secondary | ICD-10-CM

## 2023-04-18 ENCOUNTER — Emergency Department (HOSPITAL_COMMUNITY)
Admission: EM | Admit: 2023-04-18 | Discharge: 2023-04-18 | Disposition: A | Discharge status: Home / Self Care | Payer: Medicare HMO | Attending: Emergency Medicine | Admitting: Emergency Medicine

## 2023-04-18 ENCOUNTER — Emergency Department (HOSPITAL_COMMUNITY): Payer: Medicare HMO

## 2023-04-18 ENCOUNTER — Other Ambulatory Visit: Payer: Self-pay

## 2023-04-18 ENCOUNTER — Encounter (HOSPITAL_COMMUNITY): Payer: Self-pay

## 2023-04-18 DIAGNOSIS — R197 Diarrhea, unspecified: Secondary | ICD-10-CM | POA: Diagnosis not present

## 2023-04-18 DIAGNOSIS — R112 Nausea with vomiting, unspecified: Secondary | ICD-10-CM | POA: Diagnosis not present

## 2023-04-18 DIAGNOSIS — R1013 Epigastric pain: Secondary | ICD-10-CM | POA: Diagnosis not present

## 2023-04-18 DIAGNOSIS — R1084 Generalized abdominal pain: Secondary | ICD-10-CM | POA: Insufficient documentation

## 2023-04-18 DIAGNOSIS — R109 Unspecified abdominal pain: Secondary | ICD-10-CM | POA: Diagnosis not present

## 2023-04-18 DIAGNOSIS — K449 Diaphragmatic hernia without obstruction or gangrene: Secondary | ICD-10-CM | POA: Diagnosis not present

## 2023-04-18 LAB — COMPREHENSIVE METABOLIC PANEL
ALT: 21 U/L (ref 0–44)
AST: 20 U/L (ref 15–41)
Albumin: 4.3 g/dL (ref 3.5–5.0)
Alkaline Phosphatase: 103 U/L (ref 38–126)
Anion gap: 9 (ref 5–15)
BUN: 13 mg/dL (ref 8–23)
CO2: 22 mmol/L (ref 22–32)
Calcium: 8.8 mg/dL — ABNORMAL LOW (ref 8.9–10.3)
Chloride: 104 mmol/L (ref 98–111)
Creatinine, Ser: 0.86 mg/dL (ref 0.44–1.00)
GFR, Estimated: >60 mL/min (ref >60)
Glucose, Bld: 89 mg/dL (ref 70–99)
Potassium: 3.8 mmol/L (ref 3.5–5.1)
Sodium: 135 mmol/L (ref 135–145)
Total Bilirubin: 0.2 mg/dL (ref <1.2)
Total Protein: 7.5 g/dL (ref 6.5–8.1)

## 2023-04-18 LAB — CBC
HCT: 41.3 % (ref 36.0–46.0)
Hemoglobin: 13.4 g/dL (ref 12.0–15.0)
MCH: 30.8 pg (ref 26.0–34.0)
MCHC: 32.4 g/dL (ref 30.0–36.0)
MCV: 94.9 fL (ref 80.0–100.0)
Platelets: 272 10*3/uL (ref 150–400)
RBC: 4.35 MIL/uL (ref 3.87–5.11)
RDW: 12.7 % (ref 11.5–15.5)
WBC: 7.3 10*3/uL (ref 4.0–10.5)
nRBC: 0 % (ref 0.0–0.2)

## 2023-04-18 LAB — LIPASE, BLOOD: Lipase: 47 U/L (ref 11–51)

## 2023-04-18 LAB — LACTIC ACID, PLASMA: Lactic Acid, Venous: 1.6 mmol/L (ref 0.5–1.9)

## 2023-04-18 MED ORDER — IOHEXOL 300 MG/ML  SOLN
100.0000 mL | Freq: Once | INTRAMUSCULAR | Status: AC | PRN
  Administered 2023-04-18: 100 mL via INTRAVENOUS

## 2023-04-18 MED ORDER — FENTANYL CITRATE PF 50 MCG/ML IJ SOSY
50.0000 ug | PREFILLED_SYRINGE | Freq: Once | INTRAMUSCULAR | Status: AC
  Administered 2023-04-18: 50 ug via INTRAVENOUS
  Filled 2023-04-18: qty 1

## 2023-04-18 MED ORDER — ONDANSETRON 4 MG PO TBDP: 4.0000 mg | ORAL_TABLET | Freq: Three times a day (TID) | ORAL | 0 refills | Status: DC | PRN

## 2023-04-18 MED ORDER — KETOROLAC TROMETHAMINE 15 MG/ML IJ SOLN
15.0000 mg | Freq: Once | INTRAMUSCULAR | Status: AC
  Administered 2023-04-18: 15 mg via INTRAVENOUS
  Filled 2023-04-18: qty 1

## 2023-04-18 MED ORDER — ONDANSETRON HCL 4 MG/2ML IJ SOLN
4.0000 mg | Freq: Once | INTRAMUSCULAR | Status: AC
  Administered 2023-04-18: 4 mg via INTRAVENOUS
  Filled 2023-04-18: qty 2

## 2023-04-18 MED ORDER — SODIUM CHLORIDE 0.9 % IV BOLUS
1000.0000 mL | Freq: Once | INTRAVENOUS | Status: AC
  Administered 2023-04-18: 1000 mL via INTRAVENOUS

## 2023-04-18 MED ORDER — LACTATED RINGERS IV BOLUS
1000.0000 mL | Freq: Once | INTRAVENOUS | Status: AC
  Administered 2023-04-18: 1000 mL via INTRAVENOUS

## 2023-04-18 MED ORDER — DICYCLOMINE HCL 10 MG PO CAPS
10.0000 mg | ORAL_CAPSULE | Freq: Once | ORAL | Status: AC
  Administered 2023-04-18: 10 mg via ORAL
  Filled 2023-04-18: qty 1

## 2023-04-18 MED ORDER — DICYCLOMINE HCL 20 MG PO TABS: 20.0000 mg | ORAL_TABLET | Freq: Two times a day (BID) | ORAL | 0 refills | Status: AC | PRN

## 2023-04-18 NOTE — ED Notes (Signed)
Pt provided fluid PO. Able to swallow and keep it down.

## 2023-04-18 NOTE — ED Triage Notes (Signed)
Pt reports generalized abdominal that began this morning after having episodes of diarrhea and emesis.

## 2023-04-18 NOTE — ED Provider Notes (Signed)
[place] EMERGENCY DEPARTMENT AT [facility] Provider Note   CSN: [id number] Arrival date & time: 04/18/23  0010     History  Chief Complaint  Patient presents with   Emesis   Diarrhea   Abdominal Pain    Leslie Benson is a 62 y.o. female.  62 y/o female with hx of fibromyalgia, GERD, HLD, thyroid disease presents to the emergency department for abdominal pain.  She states that she has been experiencing generalized abdominal pain which began this morning.  Symptoms have remained constant with associated watery, nonbloody diarrhea.  She has had approximately 20 episodes of diarrhea since symptoms began.  Triage note references vomiting; however, patient states that she has felt nauseous but never vomited.  Denies fevers, melena, hematochezia, urinary symptoms, recent travel, sick contacts.  SHx notable for tubal ligation, cholecystectomy, partial hysterectomy, nissen fundoplication.   The history is provided by the patient. No language interpreter was used.  Emesis Associated symptoms: abdominal pain and diarrhea   Diarrhea Associated symptoms: abdominal pain and vomiting   Abdominal Pain Associated symptoms: diarrhea and vomiting        Home Medications Prior to Admission medications   Medication Sig Start Date End Date Taking? Authorizing Provider  dicyclomine (BENTYL) 20 MG tablet Take 1 tablet (20 mg total) by mouth every 12 (twelve) hours as needed (for abdominal pain/cramping). 04/18/23  Yes Antony Madura, PA-C  ondansetron (ZOFRAN-ODT) 4 MG disintegrating tablet Take 1 tablet (4 mg total) by mouth every 8 (eight) hours as needed for nausea or vomiting. 04/18/23  Yes Antony Madura, PA-C  ALPRAZolam Prudy Feeler) 1 MG tablet TAKE 1 TABLET FIVE TIMES DAILY FOR ANXIETY AND PANIC ATTACKS 04/10/23   [name], Thereasa Solo, NP  atorvastatin (LIPITOR) 40 MG tablet TAKE 1 TABLET EVERY DAY (NEED MD APPOINTMENT FOR REFILLS) 04/05/23   Jake Bathe, MD  ibuprofen  (ADVIL) 800 MG tablet Take 800 mg by mouth 4 (four) times daily. 01/11/23   [provider]  levothyroxine (SYNTHROID) 25 MCG tablet TAKE 1 TABLET EVERY DAY BEFORE BREAKFAST 04/10/23   Nafziger, Kandee Keen, NP  meclizine (ANTIVERT) 25 MG tablet Take 1 tablet (25 mg total) by mouth 3 (three) times daily as needed for dizziness. 11/15/22   Nafziger, Kandee Keen, NP  Multiple Vitamin (MULTIVITAMIN ADULT PO) Take 1 tablet by mouth daily.    [provider]  omega-3 acid ethyl esters (LOVAZA) 1 G capsule Take 1 g by mouth 2 (two) times daily.    [provider]  omeprazole (PRILOSEC) 20 MG capsule Take 1 capsule (20 mg total) by mouth daily. 06/09/22   Unk Lightning, PA  omeprazole (PRILOSEC) 40 MG capsule Take 1 capsule (40 mg total) by mouth daily. 09/11/22   Hilarie Fredrickson, MD  OVER THE COUNTER MEDICATION Take 1 tablet by mouth daily. Fiber Well    [provider]  pregabalin (LYRICA) 75 MG capsule TAKE 1 CAPSULE TWICE DAILY. 04/10/23   Nafziger, Kandee Keen, NP  QUEtiapine (SEROQUEL) 400 MG tablet TAKE 2 TABLETS AT BEDTIME 11/20/22   [name], Thereasa Solo, NP  Semaglutide, 1 MG/DOSE, (OZEMPIC, 1 MG/DOSE,) 4 MG/3ML SOPN INJECT 1MG  UNDER THE SKIN ONE TIME WEEKLY 04/13/23   Shirline Frees, NP  venlafaxine XR (EFFEXOR-XR) 75 MG 24 hr capsule TAKE 3 CAPSULES EVERY DAY 11/20/22   [name], Thereasa Solo, NP      Allergies    Codeine, Compazine [prochlorperazine edisylate], and Sulfa antibiotics    Review of Systems   Review of  Systems  Gastrointestinal:  Positive for abdominal pain, diarrhea and vomiting.  Ten systems reviewed and are negative for acute change, except as noted in the HPI.    Physical Exam Updated Vital Signs BP (!) 144/90 (BP Location: Right Arm)   Pulse 77   Temp 98.1 F (36.7 C) (Oral)   Resp 18   Ht 5\' 4"  (1.626 m)   Wt 83.9 kg   SpO2 99%   BMI 31.76 kg/m   Physical Exam Vitals and nursing note reviewed.  Constitutional:      General: She is  not in acute distress.    Appearance: She is well-developed. She is not diaphoretic.     Comments: Nontoxic appearing and in NAD  HENT:     Head: Normocephalic and atraumatic.  Eyes:     General: No scleral icterus.    Conjunctiva/sclera: Conjunctivae normal.  Cardiovascular:     Rate and Rhythm: Normal rate and regular rhythm.     Pulses: Normal pulses.  Pulmonary:     Effort: Pulmonary effort is normal. No respiratory distress.     Comments: Respirations even and unlabored. Abdominal:     Palpations: Abdomen is soft. There is no mass.     Tenderness: There is abdominal tenderness.     Comments: Generalized TTP on exam, worse in the epigastrium. No involuntary guarding or peritoneal signs. Abdomen soft, obese.   Musculoskeletal:        General: Normal range of motion.     Cervical back: Normal range of motion.  Skin:    General: Skin is warm and dry.     Coloration: Skin is not pale.     Findings: No erythema or rash.  Neurological:     Mental Status: She is alert and oriented to person, place, and time.  Psychiatric:        Behavior: Behavior normal.     ED Results / Procedures / Treatments   Labs (all labs ordered are listed, but only abnormal results are displayed) Labs Reviewed  COMPREHENSIVE METABOLIC PANEL - Abnormal; Notable for the following components:      Result Value   Calcium 8.8 (*)    All other components within normal limits  LIPASE, BLOOD  CBC  LACTIC ACID, PLASMA  URINALYSIS, ROUTINE W REFLEX MICROSCOPIC    EKG None  Radiology CT ABDOMEN PELVIS W CONTRAST Result Date: 04/18/2023 CLINICAL DATA:  History of Nissen fundoplication presenting with abdominal pain, vomiting and diarrhea. EXAM: CT ABDOMEN AND PELVIS WITH CONTRAST TECHNIQUE: Multidetector CT imaging of the abdomen and pelvis was performed using the standard protocol following bolus administration of intravenous contrast. RADIATION DOSE REDUCTION: This exam was performed according to the  departmental dose-optimization program which includes automated exposure control, adjustment of the mA and/or kV according to patient size and/or use of iterative reconstruction technique. CONTRAST:  OMNIPAQUE IOHEXOL 300 MG/ML  SOLN COMPARISON:  October 17, 2019, September 23, 2015 FINDINGS: Lower chest: Mild to moderate severity atelectatic changes are seen within the bilateral lung bases, right greater than left. Hepatobiliary: A stable 10 mm focus of parenchymal low attenuation is seen within the liver dome. Additional stable 1.3 cm cystic appearing area is seen within the anterior aspect of the left lobe of liver. Status post cholecystectomy. No biliary dilatation. Pancreas: Unremarkable. No pancreatic ductal dilatation or surrounding inflammatory changes. Spleen: Normal in size without focal abnormality. Adrenals/Urinary Tract: Adrenal glands are unremarkable. Kidneys are normal, without renal calculi, focal lesion, or hydronephrosis. Bladder is  unremarkable. Stomach/Bowel: There is a small hiatal hernia. A 3.9 cm x 2.5 cm area of fluid attenuation is seen along the left lateral aspect of the neck is seen fundoplication. This area measures 4.7 cm x 3.7 cm on the prior study. The appendix is normal in appearance and is seen extending from a cecum that is flipped in position and located within the mid left abdomen. No evidence of bowel wall thickening, distention, or inflammatory changes. Vascular/Lymphatic: Aortic atherosclerosis. Persistent twisting of the mesenteric vessels is seen within the medial aspect of the mid right abdomen (axial CT images 34 through 46, CT series 2). No enlarged abdominal or pelvic lymph nodes. Reproductive: Status post hysterectomy. No adnexal masses. Other: No abdominal wall hernia or abnormality. No abdominopelvic ascites. Musculoskeletal: No acute or significant osseous findings. IMPRESSION: 1. Small hiatal hernia. 2. Predominant stable findings which may represent partial undoing  of the patient's fundoplication wrap. 3. Persistent area of mesenteric twisting, as described above, with malpositioning of the cecum within the left abdomen. This may represent sequelae associated with a chronic internal hernia. 4. Evidence of prior cholecystectomy and hysterectomy. 5. Aortic atherosclerosis peer Aortic Atherosclerosis (ICD10-I70.0). Electronically Signed   By: Aram Candela M.D.   On: 04/18/2023 02:05    Procedures Procedures    Medications Ordered in ED Medications  lactated ringers bolus 1,000 mL (0 mLs Intravenous Stopped 04/18/23 0456)  sodium chloride 0.9 % bolus 1,000 mL (0 mLs Intravenous Stopped 04/18/23 0456)  ondansetron (ZOFRAN) injection 4 mg (4 mg Intravenous Given 04/18/23 0106)  fentaNYL (SUBLIMAZE) injection 50 mcg (50 mcg Intravenous Given 04/18/23 0105)  iohexol (OMNIPAQUE) 300 MG/ML solution 100 mL (100 mLs Intravenous Contrast Given 04/18/23 0117)  ketorolac (TORADOL) 15 MG/ML injection 15 mg (15 mg Intravenous Given 04/18/23 0255)  dicyclomine (BENTYL) capsule 10 mg (10 mg Oral Given 04/18/23 0254)    ED Course/ Medical Decision Making/ A&P Clinical Course as of 04/18/23 0507  Wed Apr 18, 2023  0100 CBC reassuring. No leukocytosis. Given hx of Nissen fundoplication, will proceed with CT abd/pelvis for further evaluation. Symptoms, however, clinically are c/w viral process. Denies recent travel, sick contacts. No recent abx use. [KH]  0250 Pain improved from 10/10 down to 5/10. CT shows findings of partial undoing of prior Nissen, but stable since at least 2021.  [KH]  1610 Spoke with Dr. Londell Moh of [facility] regarding CT read. Confirms malpositioning of the cecum and mesenteric twisting is also chronic and unchanged; present on imaging in 2021 as well as 2017. Reports no concern for acute bowel ischemia or infarction based on imaging today. [KH]  0341 Tolerating PO fluids. [KH]    Clinical Course User Index [KH] Antony Madura, PA-C                                  Medical Decision Making Amount and/or Complexity of Data Reviewed Labs: ordered. Radiology: ordered.  Risk Prescription drug management.   This patient presents to the ED for concern of abdominal pain and diarrhea, this involves an extensive number of treatment options, and is a complaint that carries with it a high risk of complications and morbidity.  The differential diagnosis includes colitis vs diverticulitis vs viral illness vs IBS vs IBD   Co morbidities that complicate the patient evaluation  Fibromyalgia HLD Thyroid disease   Additional history obtained:  Additional history obtained from spouse, at bedside.   Lab Tests:  I Ordered, and personally interpreted labs.  The pertinent results include:  CBC, CMP, lipase, lactic acid WNL.   Imaging Studies ordered:  I ordered imaging studies including CT abd/pelvis  I independently visualized and interpreted imaging which showed no acute process in the abdomen/pelvis; chronic stables changes to fundoplication wrap, mesentery.  I agree with the radiologist interpretation   Cardiac Monitoring:  The patient was maintained on a cardiac monitor.  I personally viewed and interpreted the cardiac monitored which showed an underlying rhythm of: NSR   Medicines ordered and prescription drug management:  I ordered medication including Toradol, Fentanyl, and Bentyl for pain, Zofran for nausea. Reevaluation of the patient after these medicines showed that the patient improved I have reviewed the patients home medicines and have made adjustments as needed   Test Considered:  GI pathogen panel   Problem List / ED Course:  As above   Reevaluation:  After the interventions noted above, I reevaluated the patient and found that they have :improved   Social Determinants of Health:  Good social support   Dispostion:  After consideration of the diagnostic results and the patients response to  treatment, I feel that the patent would benefit from outpatient PCP follow up with supportive care instructions. Encouraged oral hydration. Return precautions discussed and provided. Patient discharged in stable condition with no unaddressed concerns.          Final Clinical Impression(s) / ED Diagnoses Final diagnoses:  Generalized abdominal pain  Diarrhea, unspecified type    Rx / DC Orders ED Discharge Orders          Ordered    dicyclomine (BENTYL) 20 MG tablet  Every 12 hours PRN        04/18/23 0413    ondansetron (ZOFRAN-ODT) 4 MG disintegrating tablet  Every 8 hours PRN        04/18/23 0413              Antony Madura, PA-C 04/18/23 0514    Nira Conn, MD 04/18/23 [phone]

## 2023-04-18 NOTE — Discharge Instructions (Addendum)
Your urination in the ED was reassuring.  If diarrhea remains persistent, watery, and nonbloody, we recommend the use of over-the-counter Imodium to lessen stooling.  You would also benefit from utilizing an over-the-counter probiotic.  Take Bentyl as prescribed for management of ongoing abdominal pain/cramping.  Should nausea persist, you have been given a prescription for Zofran to use.  Drink plenty of fluids to prevent dehydration.  Follow-up with your primary doctor.

## 2023-04-30 ENCOUNTER — Telehealth: Payer: Self-pay

## 2023-04-30 NOTE — Progress Notes (Signed)
 Transition Care Management Unsuccessful Follow-up Telephone Call  Date of discharge and from where:  04/18/2023 [facility]  Attempts:  1st Attempt  Reason for unsuccessful TCM follow-up call:  No answer/busy  Stephenson Cichy [facility]  [facility], [facility] Resource Care Guide Direct Dial: [phone]  Website: [email or web address]

## 2023-05-01 ENCOUNTER — Telehealth: Payer: Self-pay

## 2023-05-01 NOTE — Progress Notes (Signed)
 Transition Care Management Follow-up Telephone Call Date of discharge and from where: 04/18/2023 [facility] How have you been since you were released from the hospital? Patient stated she is still having stomach pain. Any questions or concerns? No  Items Reviewed: Did the pt receive and understand the discharge instructions provided? Yes  Medications obtained and verified? Yes  Other? No  Any new allergies since your discharge? No  Dietary orders reviewed? Yes Do you have support at home? Yes   Follow up appointments reviewed:  PCP Hospital f/u appt confirmed? No  Scheduled to see  on  @ . Specialist Hospital f/u appt confirmed?  Patient stated she plans to make an appointment with Norleen SAILOR. Abran, MD, Gastroenterologist.   Scheduled to see  on  @ . Are transportation arrangements needed? No  If their condition worsens, is the pt aware to call PCP or go to the Emergency Dept.? Yes Was the patient provided with contact information for the PCP's office or ED? Yes Was to pt encouraged to call back with questions or concerns? Yes   Ladale Sherburn [facility]  [facility], [facility] Guide Direct Dial: [phone]  Website: [email or web address]

## 2023-05-14 ENCOUNTER — Ambulatory Visit (HOSPITAL_BASED_OUTPATIENT_CLINIC_OR_DEPARTMENT_OTHER): Payer: Medicare HMO | Admitting: Cardiology

## 2023-05-14 VITALS — BP 94/66 | HR 88 | Ht 64.0 in | Wt 189.0 lb

## 2023-05-14 DIAGNOSIS — I2510 Atherosclerotic heart disease of native coronary artery without angina pectoris: Secondary | ICD-10-CM | POA: Diagnosis not present

## 2023-05-14 DIAGNOSIS — Z8249 Family history of ischemic heart disease and other diseases of the circulatory system: Secondary | ICD-10-CM

## 2023-05-14 DIAGNOSIS — E785 Hyperlipidemia, unspecified: Secondary | ICD-10-CM | POA: Diagnosis not present

## 2023-05-14 DIAGNOSIS — I951 Orthostatic hypotension: Secondary | ICD-10-CM | POA: Diagnosis not present

## 2023-05-14 DIAGNOSIS — E1169 Type 2 diabetes mellitus with other specified complication: Secondary | ICD-10-CM | POA: Diagnosis not present

## 2023-05-14 NOTE — Patient Instructions (Signed)
Medication Instructions:  ?Your physician recommends that you continue on your current medications as directed. Please refer to the Current Medication list given to you today.  ? ?Labwork: ?NONE ? ?Testing/Procedures: ?NONE ? ?Follow-Up: ?AS NEEDED  ? ?  ?

## 2023-05-14 NOTE — Progress Notes (Signed)
Cardiology Office Note:  .   Date:  05/14/2023  ID:  Leslie Benson, DOB 10/29/1960, MRN [id number] PCP: Shirline Frees, NP  [facility] Providers Cardiologist:  None     History of Present Illness: .   Leslie Benson is a 63 y.o. female Discussed with the use of AI scribe  History of Present Illness   The patient, a 63 year old with a strong family history of coronary artery disease, presents for follow-up after a coronary CT scan and echocardiogram. The CT scan, performed on 10/02/2019, revealed a coronary calcium score of 5 (68th percentile), minimal non-flow limiting coronary disease of the LAD, and minimal aortic atherosclerosis. The echocardiogram, performed on 09/10/2019, showed an EF of 60%, grade one diastolic dysfunction, and normal valves. The patient has been on atorvastatin 40mg  with an LDL goal of less than 70. The last LDL, measured on 04/28/2022, was 72.  The patient reports not feeling well due to a long-standing diagnosis of fatty liver disease. She describes stomach pain, particularly on the right side, and swelling. The patient had a recent emergency room visit due to severe pain and diarrhea.  The patient also mentions a history of vertigo and a hiatal hernia that was surgically repaired. She has been experiencing nausea and has a history of food getting stuck in her throat, requiring multiple endoscopic procedures for esophageal stricture dilation. The patient is diligent about preventive health measures, including regular colonoscopies and breast exams. She expresses a desire for updated blood work, as it has been a while since her last labs.          ROS: No CP  Studies Reviewed: Marland Kitchen   EKG Interpretation Date/Time:  Monday May 14 2023 16:57:53 EST Ventricular Rate:  88 PR Interval:  168 QRS Duration:  114 QT Interval:  374 QTC Calculation: 452 R Axis:   71  Text Interpretation: Normal sinus rhythm Incomplete right bundle branch block When  compared with ECG of 12-May-2022 20:18, No significant change since last tracing Confirmed by Donato Schultz ([id number]) on 05/14/2023 5:08:18 PM    Results   LABS Creatinine: 0.9 ALT: 33 Hemoglobin A1c: 5.4 LDL: 72 (04/28/2022) Potassium: 3.8  RADIOLOGY Coronary CT: Coronary calcium score of 5, minimal non-flow limiting coronary disease of LAD, minimal aortic atherosclerosis (10/02/2019)  DIAGNOSTIC Echocardiogram: EF 60%, Grade 1 diastolic dysfunction, normal valves (09/10/2019) ECG: Normal     Risk Assessment/Calculations:            Physical Exam:   VS:  BP 94/66 (BP Location: Right Arm, Patient Position: Sitting, Cuff Size: Normal)   Pulse 88   Ht 5\' 4"  (1.626 m)   Wt 189 lb (85.7 kg)   SpO2 97%   BMI 32.44 kg/m    Wt Readings from Last 3 Encounters:  05/14/23 189 lb (85.7 kg)  04/18/23 185 lb (83.9 kg)  02/12/23 187 lb 12.8 oz (85.2 kg)    GEN: Well nourished, well developed in no acute distress NECK: No JVD; No carotid bruits CARDIAC: RRR, no murmurs, no rubs, no gallops RESPIRATORY:  Clear to auscultation without rales, wheezing or rhonchi  ABDOMEN: Soft, non-tender, non-distended EXTREMITIES:  No edema; No deformity   ASSESSMENT AND PLAN: .    Assessment and Plan    Coronary Artery Disease   Follow-up for coronary artery disease with minimal non-flow limiting coronary disease in the LAD and minimal aortic atherosclerosis. Coronary calcium score of 5 (68th percentile) from CT scan on 10/02/2019. Echocardiogram on 09/10/2019 showed  EF of 60%, grade 1 diastolic dysfunction, and normal valves. LDL goal is <70, with the last LDL on 04/28/2022 being 72. Currently on atorvastatin 40 mg. Discussed maintaining LDL <70 to reduce future cardiac events. Encouraged adherence to a Mediterranean diet and regular exercise. Discussed low-dose aspirin but decided against it due to gastrointestinal symptoms.   - Continue atorvastatin 40 mg   - Maintain LDL goal <70   - Encourage  Mediterranean diet and regular exercise   - PRN follow-up with cardiology    Fatty Liver Disease   Chronic fatty liver disease, first diagnosed in her 38s. Reports ongoing right-sided abdominal pain and swelling, with recent exacerbation leading to an emergency room visit. Symptoms include diarrhea and nausea. Plans to follow up with GI specialist Dr. Yancey Flemings.   - Follow up with GI specialist Dr. Yancey Flemings   - Monitor symptoms and seek further evaluation if pain persists    Hiatal Hernia   Hiatal hernia with previous surgical repair. Reports ongoing symptoms including nausea and abdominal discomfort. Discussed that symptoms could be related to the hernia or other gastrointestinal issues and should be evaluated by the GI specialist.   - Discuss symptoms with GI specialist Dr. Yancey Flemings   - Consider further evaluation if symptoms persist    Esophageal Stricture   Esophageal stricture requiring dilation procedures. Reports ongoing issues with food impaction and has had the stricture dilated three times. Discussed potential endoscopy if symptoms persist.   - Follow up with GI specialist for potential endoscopy if symptoms persist    General Health Maintenance   Up to date with colonoscopies, breast exams, and other routine health maintenance. Needs updated blood work.   - Order blood work   - Continue routine health maintenance including colonoscopies and breast exams    Follow-up   - PRN follow-up with cardiology   - Continue follow-up with primary care physician Shirline Frees, NP. Please reach out if needed.               Signed, Donato Schultz, MD

## 2023-06-01 ENCOUNTER — Other Ambulatory Visit (HOSPITAL_BASED_OUTPATIENT_CLINIC_OR_DEPARTMENT_OTHER): Payer: Self-pay | Admitting: Cardiology

## 2023-06-08 ENCOUNTER — Other Ambulatory Visit: Payer: Self-pay | Admitting: Adult Health

## 2023-06-08 DIAGNOSIS — G4700 Insomnia, unspecified: Secondary | ICD-10-CM

## 2023-06-08 DIAGNOSIS — F410 Panic disorder [episodic paroxysmal anxiety] without agoraphobia: Secondary | ICD-10-CM

## 2023-06-20 ENCOUNTER — Ambulatory Visit: Payer: Self-pay | Admitting: Adult Health

## 2023-06-20 ENCOUNTER — Ambulatory Visit (INDEPENDENT_AMBULATORY_CARE_PROVIDER_SITE_OTHER): Payer: Medicare HMO

## 2023-06-20 ENCOUNTER — Ambulatory Visit
Admission: EM | Admit: 2023-06-20 | Discharge: 2023-06-20 | Disposition: A | Discharge status: Home / Self Care | Payer: Medicare HMO | Attending: Family Medicine | Admitting: Family Medicine

## 2023-06-20 ENCOUNTER — Other Ambulatory Visit: Payer: Self-pay | Admitting: Internal Medicine

## 2023-06-20 DIAGNOSIS — K222 Esophageal obstruction: Secondary | ICD-10-CM

## 2023-06-20 DIAGNOSIS — R1310 Dysphagia, unspecified: Secondary | ICD-10-CM

## 2023-06-20 DIAGNOSIS — K219 Gastro-esophageal reflux disease without esophagitis: Secondary | ICD-10-CM

## 2023-06-20 DIAGNOSIS — M545 Low back pain, unspecified: Secondary | ICD-10-CM | POA: Diagnosis not present

## 2023-06-20 MED ORDER — LIDOCAINE 5 % EX PTCH: 1.0000 | MEDICATED_PATCH | CUTANEOUS | 0 refills | Status: DC

## 2023-06-20 MED ORDER — NAPROXEN 375 MG PO TABS: 375.0000 mg | ORAL_TABLET | Freq: Two times a day (BID) | ORAL | 0 refills | Status: DC | PRN

## 2023-06-20 NOTE — ED Triage Notes (Signed)
 Pt present with c/o lower back and ;rt side pain X 1 wk. Pt states she fell 2 wks ago.   Home interventions: Tylenol

## 2023-06-20 NOTE — ED Provider Notes (Signed)
 [facility]    CSN: [id number] Arrival date & time: 06/20/23  1851      History   Chief Complaint Chief Complaint  Patient presents with   Back Pain    HPI Leslie Benson is a 63 y.o. female presents for back pain.  Patient reports 1 month ago while in her bathroom she slipped and fell landing onto her tub hitting her lower back.  States since then she has had a intermittent midline low back pain/left-sided low back pain that does not radiate.  It is worse with standing and movement.  Denies any numbness/tingling/weakness of her lower extremities, no bowel or bladder incontinence, no saddle paresthesia.  Reports a history of herniated disc in her cervical spine but no history of injuries/fractures/surgeries of her lower back.  She has been taking Tylenol with minimal improvement.  No other concerns at this time.   Back Pain   Past Medical History:  Diagnosis Date   ADHD    Allergy    Anemia    past hx    Anxiety    Barrett esophagus    Breast nodule    benign   Cervical arthritis 09/22/2013   Depression    Elevated liver enzymes    Fatty liver    Fibromyalgia    Fibromyalgia    GERD (gastroesophageal reflux disease)    H/O hiatal hernia    Headache(784.0)    migraines   High cholesterol    History of UTI    Lymphocytic colitis    Migraines    Pain    Panic attack    Paraesophageal hiatal hernia    repaired 2009   Spinal stenosis    Thyroid disease    Thyroid disease    Vertigo     Patient Active Problem List   Diagnosis Date Noted   Coronary artery calcification 06/28/2021   Orthostatic hypotension 12/04/2020   Vertigo 12/02/2020   Dizziness of unknown cause 12/02/2020   GERD (gastroesophageal reflux disease)    Anxiety    Other hyperlipidemia 01/23/2018   Class 1 obesity with serious comorbidity and body mass index (BMI) of 30.0 to 30.9 in adult 01/23/2018   Hypothyroidism 11/25/2014   Adynamic ileus (HCC) 09/22/2013   Obesity (BMI  30-39.9) 09/22/2013   Abdominal pain, epigastric 09/22/2013   Transaminitis 09/22/2013   Cervical arthritis 09/22/2013   Chronic constipation 09/22/2013   Fibromyalgia     Past Surgical History:  Procedure Laterality Date   ANKLE SURGERY     CHOLECYSTECTOMY  1980s?   COLONOSCOPY  2010   Buccini   EUS N/A 11/19/2013   Procedure: ESOPHAGEAL ENDOSCOPIC ULTRASOUND (EUS) RADIAL;  Surgeon: Willis Modena, MD;  Location: [facility];  Service: Endoscopy;  Laterality: N/A;   LAPAROSCOPIC NISSEN FUNDOPLICATION  03/05/2008   LAPAROSCOPIC PARAESOPHAGEAL HERNIA REPAIR  03/05/2008   Dr Michaell Cowing   TUBAL LIGATION  1990s?   UPPER GASTROINTESTINAL ENDOSCOPY  04/05/2018   Yancey Flemings    VAGINAL HYSTERECTOMY  03/04/2001    OB History     Gravida  4   Para  4   Term      Preterm      AB      Living         SAB      IAB      Ectopic      Multiple      Live Births  Home Medications    Prior to Admission medications   Medication Sig Start Date End Date Taking? Authorizing Provider  lidocaine (LIDODERM) 5 % Place 1 patch onto the skin daily. Applied to lower back area for 12 hours and remove for 12 hours 06/20/23  Yes Radford Pax, NP  naproxen (NAPROSYN) 375 MG tablet Take 1 tablet (375 mg total) by mouth 2 (two) times daily as needed (back pain). 06/20/23  Yes Radford Pax, NP  ALPRAZolam Prudy Feeler) 1 MG tablet TAKE 1 TABLET FIVE TIMES DAILY FOR ANXIETY AND PANIC ATTACKS 04/10/23   [name], Thereasa Solo, NP  atorvastatin (LIPITOR) 40 MG tablet TAKE 1 TABLET EVERY DAY (KEEP APPOINTMENT ON 05/14/2023 WITH DR. Anne Fu FOR FURTHER REFILLS) 06/01/23   Jake Bathe, MD  dicyclomine (BENTYL) 20 MG tablet Take 1 tablet (20 mg total) by mouth every 12 (twelve) hours as needed (for abdominal pain/cramping). 04/18/23   Antony Madura, PA-C  ibuprofen (ADVIL) 800 MG tablet Take 800 mg by mouth 4 (four) times daily. 01/11/23   [provider]  levothyroxine (SYNTHROID) 25  MCG tablet TAKE 1 TABLET EVERY DAY BEFORE BREAKFAST 04/10/23   Nafziger, Kandee Keen, NP  meclizine (ANTIVERT) 25 MG tablet Take 1 tablet (25 mg total) by mouth 3 (three) times daily as needed for dizziness. 11/15/22   Nafziger, Kandee Keen, NP  Multiple Vitamin (MULTIVITAMIN ADULT PO) Take 1 tablet by mouth daily.    [provider]  omega-3 acid ethyl esters (LOVAZA) 1 G capsule Take 1 g by mouth 2 (two) times daily.    [provider]  omeprazole (PRILOSEC) 20 MG capsule Take 1 capsule (20 mg total) by mouth daily. 06/09/22   Unk Lightning, PA  omeprazole (PRILOSEC) 40 MG capsule TAKE 1 CAPSULE EVERY DAY 06/20/23   Hilarie Fredrickson, MD  ondansetron (ZOFRAN-ODT) 4 MG disintegrating tablet Take 1 tablet (4 mg total) by mouth every 8 (eight) hours as needed for nausea or vomiting. 04/18/23   Antony Madura, PA-C  OVER THE COUNTER MEDICATION Take 1 tablet by mouth daily. Fiber Well    [provider]  pregabalin (LYRICA) 75 MG capsule TAKE 1 CAPSULE TWICE DAILY. 04/10/23   Nafziger, Kandee Keen, NP  QUEtiapine (SEROQUEL) 400 MG tablet TAKE 2 TABLETS AT BEDTIME 11/20/22   [name], Thereasa Solo, NP  Semaglutide, 1 MG/DOSE, (OZEMPIC, 1 MG/DOSE,) 4 MG/3ML SOPN INJECT 1MG  UNDER THE SKIN ONE TIME WEEKLY 04/13/23   Nafziger, Kandee Keen, NP  venlafaxine XR (EFFEXOR-XR) 75 MG 24 hr capsule TAKE 3 CAPSULES EVERY DAY 11/20/22   [name], Thereasa Solo, NP    Family History Family History  Problem Relation Age of Onset   Alcohol abuse Other    Elevated Lipids Other    Heart disease Other    Anxiety disorder Mother    Obesity Mother    High Cholesterol Mother    Thyroid cancer Mother    Depression Mother    Breast cancer Mother    Bone cancer Mother    High Cholesterol Father    Heart disease Father    Alcoholism Father    Colon cancer Maternal Aunt    Colon polyps Sister    Stomach cancer Neg Hx    Esophageal cancer Neg Hx    Rectal cancer Neg Hx     Social History Social History    Tobacco Use   Smoking status: Never   Smokeless tobacco: Never  Vaping Use   Vaping status: Never Used  Substance Use Topics  Alcohol use: Not Currently    Comment: wine occassionally   Drug use: No     Allergies   Codeine, Compazine [prochlorperazine edisylate], and Sulfa antibiotics   Review of Systems Review of Systems  Musculoskeletal:  Positive for back pain.     Physical Exam Triage Vital Signs ED Triage Vitals [06/20/23 1923]  Encounter Vitals Group     BP 107/74     Systolic BP Percentile      Diastolic BP Percentile      Pulse Rate 89     Resp 17     Temp 98.9 F (37.2 C)     Temp Source Oral     SpO2 95 %     Weight      Height      Head Circumference      Peak Flow      Pain Score 8     Pain Loc      Pain Education      Exclude from Growth Chart    No data found.  Updated Vital Signs BP 107/74 (BP Location: Right Arm)   Pulse 89   Temp 98.9 F (37.2 C) (Oral)   Resp 17   SpO2 95%   Visual Acuity Right Eye Distance:   Left Eye Distance:   Bilateral Distance:    Right Eye Near:   Left Eye Near:    Bilateral Near:     Physical Exam Vitals and nursing note reviewed.  Constitutional:      General: She is not in acute distress.    Appearance: Normal appearance. She is not ill-appearing.  HENT:     Head: Normocephalic and atraumatic.  Eyes:     Pupils: Pupils are equal, round, and reactive to light.  Cardiovascular:     Rate and Rhythm: Normal rate.  Pulmonary:     Effort: Pulmonary effort is normal.  Musculoskeletal:     Thoracic back: Normal.     Lumbar back: Tenderness and bony tenderness present. No swelling, edema, deformity, signs of trauma, lacerations or spasms. Normal range of motion. Negative right straight leg raise test and negative left straight leg raise test. No scoliosis.       Back:     Comments: Strength is 5 out of 5 bilateral lower extremities  Skin:    General: Skin is warm and dry.  Neurological:      General: No focal deficit present.     Mental Status: She is alert and oriented to person, place, and time.  Psychiatric:        Mood and Affect: Mood normal.        Behavior: Behavior normal.      UC Treatments / Results  Labs (all labs ordered are listed, but only abnormal results are displayed) Labs Reviewed - No data to display  Comprehensive metabolic panel Order: [id number]  Status: Final result     Next appt: 06/26/2023 at 03:00 PM in Family Medicine Shirline Frees, NP)   Test Result Released: Yes (seen)   0 Result Notes     1 HM Topic          Component Ref Range & Units (hover) 2 mo ago (04/18/23) 4 mo ago (02/12/23) 1 yr ago (05/12/22) 1 yr ago (04/28/22) 1 yr ago (06/22/21) 2 yr ago (12/03/20) 2 yr ago (12/02/20)  Sodium 135 139 R 136 140 R 138 R 139   Potassium 3.8 4.3 R 3.7 4.2 R 4.4 R 3.8  Chloride 104 101 R 101 103 R 100 R 107   CO2 22 30 R 27 29 R 30 R 24   Glucose, Bld 89 79 93 CM 93 96 104 High  CM   Comment: Glucose reference range applies only to samples taken after fasting for at least 8 hours.  BUN 13 11 R 11 8 R 11 R 10   Creatinine, Ser 0.86 1.06 R 0.93 1.04 R 0.98 R 0.80 0.76  Calcium 8.8 Low  9.6 R 8.7 Low  9.8 R 9.8 R 8.8 Low    Total Protein 7.5  7.0 7.1 R  6.3 Low    Albumin 4.3  4.1 4.5 R  3.9   AST 20  38 30 R  21   ALT 21  46 High  43 High  R  33   Alkaline Phosphatase 103  100 128 High  R  94   Total Bilirubin 0.2  0.3 R 0.4 R  0.4 R   GFR, Estimated >60  >60 CM   >60 CM >60 CM  Comment: (NOTE) Calculated using the CKD-EPI Creatinine Equation (2021)  Anion gap 9  8 CM   8 CM   Comment: Performed at [facility], [address] [place], [place] [address]  Resulting Agency [facility] [place] [facility] [place] [facility] [place] [facility]        Specimen Collected: 04/18/23 00:37 Last Resulted: 04/18/23 01:10    EKG   Radiology No results found.  Procedures Procedures  (including critical care time)  Medications Ordered in UC Medications - No data to display  Initial Impression / Assessment and Plan / UC Course  I have reviewed the triage vital signs and the nursing notes.  Pertinent labs & imaging results that were available during my care of the patient were reviewed by me and considered in my medical decision making (see chart for details).     Reviewed exam and symptoms with patient.  No red flags.  Wet read of x-ray without obvious fracture, will contact for any positive results based on radiology overread.  Start naproxen twice daily as needed.  Lidoderm patch as well.  Continue heat and rest.  PCP follow-up 2 to 3 days for recheck.  ER precautions reviewed and patient verbalized understanding. Final Clinical Impressions(s) / UC Diagnoses   Final diagnoses:  Acute midline low back pain without sciatica     Discharge Instructions      Start naproxen twice daily as needed.  Start Lidoderm patch to the low back every 12 hours.  Continue heat and rest.  Please follow-up with your PCP in 2 to 3 days for recheck.  Please go to the ER for any worsening symptoms.  Hope you feel better soon!     ED Prescriptions     Medication Sig Dispense Auth. Provider   naproxen (NAPROSYN) 375 MG tablet Take 1 tablet (375 mg total) by mouth 2 (two) times daily as needed (back pain). 14 tablet Radford Pax, NP   lidocaine (LIDODERM) 5 % Place 1 patch onto the skin daily. Applied to lower back area for 12 hours and remove for 12 hours 7 patch Radford Pax, NP      PDMP not reviewed this encounter.   Radford Pax, NP 06/20/23 2004

## 2023-06-20 NOTE — Telephone Encounter (Signed)
 Copied from CRM [id number]. Topic: Clinical - Red Word Triage >> Jun 20, 2023  3:51 PM Sim Boast F wrote: Red Word that prompted transfer to Nurse Triage:Patient having lower back pain 10/10   Chief Complaint: Back Pain Symptoms: Back Pain, Leg Pain Frequency: One Month Pertinent Negatives: Patient denies numbness, weakness, tingling Disposition: [] ED /[x] Urgent Care (no appt availability in office) / [] Appointment(In office/virtual)/ []  [place] [facility]/ [] Home Care/ [] Refused Recommended Disposition /[] [place] [facility]/ []  Follow-up with PCP Additional Notes: DH contacted via phone regarding concerns of Back pain Discussed symptoms, severity, and duration. Based on assessment, patient was advised to see UC Provider. Patient verbalized understanding and agreement with plan. Documentation provided.     Reason for Disposition  [1] SEVERE back pain (e.g., excruciating, unable to do any normal activities) AND [2] not improved 2 hours after pain medicine  Answer Assessment - Initial Assessment Questions 1. ONSET: "When did the pain begin?"      One month  2. LOCATION: "Where does it hurt?" (upper, mid or lower back)     Lower   3. SEVERITY: "How bad is the pain?"  (e.g., Scale 1-10; mild, moderate, or severe)   - MILD (1-3): Doesn't interfere with normal activities.    - MODERATE (4-7): Interferes with normal activities or awakens from sleep.    - SEVERE (8-10): Excruciating pain, unable to do any normal activities.      10  4. PATTERN: "Is the pain constant?" (e.g., yes, no; constant, intermittent)      Intermittent, with exertion  5. RADIATION: "Does the pain shoot into your legs or somewhere else?"     Spinal  6. CAUSE:  "What do you think is causing the back pain?"      Unsure  7. BACK OVERUSE:  "Any recent lifting of heavy objects, strenuous work or exercise?"     House work  8. MEDICINES: "What have you taken so far for the pain?" (e.g., nothing, acetaminophen,  NSAIDS)     Tylenol, no relief  9. NEUROLOGIC SYMPTOMS: "Do you have any weakness, numbness, or problems with bowel/bladder control?"     No  10. OTHER SYMPTOMS: "Do you have any other symptoms?" (e.g., fever, abdomen pain, burning with urination, blood in urine)       Pain with walking, Weight Gain  11. PREGNANCY: "Is there any chance you are pregnant?" "When was your last menstrual period?"       No and No  Protocols used: Back Pain-A-AH

## 2023-06-20 NOTE — Discharge Instructions (Signed)
 Start naproxen twice daily as needed.  Start Lidoderm patch to the low back every 12 hours.  Continue heat and rest.  Please follow-up with your PCP in 2 to 3 days for recheck.  Please go to the ER for any worsening symptoms.  Hope you feel better soon!

## 2023-06-21 ENCOUNTER — Telehealth: Payer: Self-pay | Admitting: *Deleted

## 2023-06-21 ENCOUNTER — Other Ambulatory Visit: Payer: Self-pay | Admitting: Adult Health

## 2023-06-21 DIAGNOSIS — E669 Obesity, unspecified: Secondary | ICD-10-CM

## 2023-06-21 DIAGNOSIS — R7303 Prediabetes: Secondary | ICD-10-CM

## 2023-06-21 NOTE — Telephone Encounter (Signed)
**Note De-identified Hilliary Jock Obfuscation** Please advise 

## 2023-06-21 NOTE — Telephone Encounter (Signed)
 Copied from CRM [id number]. Topic: Clinical - Medication Question >> Jun 21, 2023 10:07 AM Presley Raddle C wrote: Reason for CRM: pt called and stated that she was evaluated at Urgent Care due to back pain. She stated the doctor wanted to prescribe her Lidocaine but was told her PCP had to prescribe it. She asked if Kandee Keen could send that in now. Please call and advise.

## 2023-06-21 NOTE — Telephone Encounter (Signed)
 Ok to fill? Pt has an appt. 06/26/2023

## 2023-06-21 NOTE — Telephone Encounter (Signed)
 Patient Is needing for dr to sign off on the medication    lidocaine

## 2023-06-22 NOTE — Telephone Encounter (Signed)
 Left detailed message informing  of update.

## 2023-06-22 NOTE — Telephone Encounter (Signed)
 Noted.

## 2023-06-26 ENCOUNTER — Ambulatory Visit: Payer: Medicare HMO | Admitting: Adult Health

## 2023-07-12 ENCOUNTER — Other Ambulatory Visit: Payer: Self-pay

## 2023-07-12 ENCOUNTER — Emergency Department (HOSPITAL_BASED_OUTPATIENT_CLINIC_OR_DEPARTMENT_OTHER)
Admission: EM | Admit: 2023-07-12 | Discharge: 2023-07-12 | Disposition: A | Discharge status: Home / Self Care | Attending: Emergency Medicine | Admitting: Emergency Medicine

## 2023-07-12 ENCOUNTER — Encounter: Payer: Self-pay | Admitting: Adult Health

## 2023-07-12 ENCOUNTER — Emergency Department (HOSPITAL_BASED_OUTPATIENT_CLINIC_OR_DEPARTMENT_OTHER)

## 2023-07-12 ENCOUNTER — Encounter (HOSPITAL_BASED_OUTPATIENT_CLINIC_OR_DEPARTMENT_OTHER): Payer: Self-pay | Admitting: Emergency Medicine

## 2023-07-12 ENCOUNTER — Ambulatory Visit: Payer: Medicare HMO | Admitting: Adult Health

## 2023-07-12 DIAGNOSIS — W1839XA Other fall on same level, initial encounter: Secondary | ICD-10-CM | POA: Diagnosis not present

## 2023-07-12 DIAGNOSIS — G4700 Insomnia, unspecified: Secondary | ICD-10-CM | POA: Diagnosis not present

## 2023-07-12 DIAGNOSIS — F331 Major depressive disorder, recurrent, moderate: Secondary | ICD-10-CM | POA: Diagnosis not present

## 2023-07-12 DIAGNOSIS — J45909 Unspecified asthma, uncomplicated: Secondary | ICD-10-CM | POA: Diagnosis not present

## 2023-07-12 DIAGNOSIS — F411 Generalized anxiety disorder: Secondary | ICD-10-CM

## 2023-07-12 DIAGNOSIS — F410 Panic disorder [episodic paroxysmal anxiety] without agoraphobia: Secondary | ICD-10-CM

## 2023-07-12 DIAGNOSIS — M79642 Pain in left hand: Secondary | ICD-10-CM | POA: Diagnosis not present

## 2023-07-12 DIAGNOSIS — M25532 Pain in left wrist: Secondary | ICD-10-CM | POA: Insufficient documentation

## 2023-07-12 MED ORDER — ALPRAZOLAM 1 MG PO TABS: ORAL_TABLET | ORAL | 2 refills | Status: DC

## 2023-07-12 MED ORDER — VENLAFAXINE HCL ER 75 MG PO CP24: ORAL_CAPSULE | ORAL | 3 refills | Status: AC

## 2023-07-12 MED ORDER — QUETIAPINE FUMARATE 400 MG PO TABS: 800.0000 mg | ORAL_TABLET | Freq: Every day | ORAL | 3 refills | Status: AC

## 2023-07-12 NOTE — ED Triage Notes (Signed)
 C/o left wrist pain from catching herself after fall 2 days ago.

## 2023-07-12 NOTE — Discharge Instructions (Addendum)
 As discussed, your x-ray did not show evidence of fracture or dislocation.  I suspect the pain of sprained your wrist.  Wear your brace as needed.  Recommend rest, ice, elevation, Tylenol/naproxen/Aleve for treatment of your pain.  Recommend follow-up with hand specialist in the outpatient setting if you have continued symptoms.  Please do not hesitate to return if the worrisome signs and symptoms we discussed become apparent.

## 2023-07-12 NOTE — ED Provider Notes (Signed)
 [place] EMERGENCY DEPARTMENT AT [facility] Provider Note   CSN: [id number] Arrival date & time: 07/12/23  1805     History  Chief Complaint  Patient presents with   Wrist Pain    Leslie Benson is a 63 y.o. female.   Wrist Pain   63 year old female presents emergency department complaints of left-sided wrist/hand pain.  States that she was in the bathroom few days ago when her dog ran beneath her feet causing her to lose her balance falling backwards.  States that she braced her fall with her left hand.  Denies trauma to head, LOC.  Denies trauma elsewhere.  States that she has been trying to treat her symptoms of the counter medication but does continue to have pain and swelling and is concerned about potential fracture.  Denies any chest pain, shortness of breath, abdominal pain, nausea, vomiting.  Denies blood thinner use.  Past medical history significant for fibromyalgia, hiatal hernia, GERD, migraine, thyroid disease, ADHD, asthma  Home Medications Prior to Admission medications   Medication Sig Start Date End Date Taking? Authorizing Provider  ALPRAZolam Prudy Feeler) 1 MG tablet TAKE 1 TABLET FIVE TIMES DAILY FOR ANXIETY AND PANIC ATTACKS 07/12/23   [name], Thereasa Solo, NP  atorvastatin (LIPITOR) 40 MG tablet TAKE 1 TABLET EVERY DAY (KEEP APPOINTMENT ON 05/14/2023 WITH DR. Anne Fu FOR FURTHER REFILLS) 06/01/23   Jake Bathe, MD  dicyclomine (BENTYL) 20 MG tablet Take 1 tablet (20 mg total) by mouth every 12 (twelve) hours as needed (for abdominal pain/cramping). 04/18/23   Antony Madura, PA-C  ibuprofen (ADVIL) 800 MG tablet Take 800 mg by mouth 4 (four) times daily. 01/11/23   [provider]  levothyroxine (SYNTHROID) 25 MCG tablet TAKE 1 TABLET EVERY DAY BEFORE BREAKFAST 04/10/23   Nafziger, Kandee Keen, NP  lidocaine (LIDODERM) 5 % Place 1 patch onto the skin daily. Applied to lower back area for 12 hours and remove for 12 hours 06/20/23   Radford Pax, NP   meclizine (ANTIVERT) 25 MG tablet Take 1 tablet (25 mg total) by mouth 3 (three) times daily as needed for dizziness. 11/15/22   Nafziger, Kandee Keen, NP  Multiple Vitamin (MULTIVITAMIN ADULT PO) Take 1 tablet by mouth daily.    [provider]  naproxen (NAPROSYN) 375 MG tablet Take 1 tablet (375 mg total) by mouth 2 (two) times daily as needed (back pain). 06/20/23   Radford Pax, NP  omega-3 acid ethyl esters (LOVAZA) 1 G capsule Take 1 g by mouth 2 (two) times daily.    [provider]  omeprazole (PRILOSEC) 20 MG capsule Take 1 capsule (20 mg total) by mouth daily. 06/09/22   Unk Lightning, PA  omeprazole (PRILOSEC) 40 MG capsule TAKE 1 CAPSULE EVERY DAY 06/20/23   Hilarie Fredrickson, MD  ondansetron (ZOFRAN-ODT) 4 MG disintegrating tablet Take 1 tablet (4 mg total) by mouth every 8 (eight) hours as needed for nausea or vomiting. 04/18/23   Antony Madura, PA-C  OVER THE COUNTER MEDICATION Take 1 tablet by mouth daily. Fiber Well    [provider]  pregabalin (LYRICA) 75 MG capsule TAKE 1 CAPSULE TWICE DAILY. 04/10/23   Nafziger, Kandee Keen, NP  QUEtiapine (SEROQUEL) 400 MG tablet Take 2 tablets (800 mg total) by mouth at bedtime. 07/12/23   [name], Thereasa Solo, NP  Semaglutide, 1 MG/DOSE, (OZEMPIC, 1 MG/DOSE,) 4 MG/3ML SOPN INJECT 1MG  UNDER THE SKIN ONE TIME WEEKLY 06/21/23   Shirline Frees, NP  venlafaxine XR [facility])  75 MG 24 hr capsule TAKE 3 CAPSULES EVERY DAY 07/12/23   [name], Thereasa Solo, NP      Allergies    Codeine, Compazine [prochlorperazine edisylate], and Sulfa antibiotics    Review of Systems   Review of Systems  All other systems reviewed and are negative.   Physical Exam Updated Vital Signs BP 119/89 (BP Location: Right Arm)   Pulse 88   Temp 97.9 F (36.6 C) (Oral)   Resp 16   Ht 5\' 4"  (1.626 m)   Wt 88.5 kg   SpO2 99%   BMI 33.47 kg/m  Physical Exam Vitals and nursing note reviewed.  Constitutional:      General: She is not in  acute distress.    Appearance: She is well-developed.  HENT:     Head: Normocephalic and atraumatic.  Eyes:     Conjunctiva/sclera: Conjunctivae normal.  Cardiovascular:     Rate and Rhythm: Normal rate and regular rhythm.     Heart sounds: No murmur heard. Pulmonary:     Effort: Pulmonary effort is normal. No respiratory distress.     Breath sounds: Normal breath sounds.  Abdominal:     Palpations: Abdomen is soft.     Tenderness: There is no abdominal tenderness.  Musculoskeletal:        General: No swelling.     Cervical back: Neck supple.     Comments: Full range of motion left wrist and digits of left hand.  Tender palpation distal radius and ulna.  Radial pulse 2+ bilaterally.  Capillary for less than 2 seconds.  No overlying erythema, palpable fluctuance/induration.  Mild swelling dorsal aspect of left wrist.  Skin:    General: Skin is warm and dry.     Capillary Refill: Capillary refill takes less than 2 seconds.  Neurological:     Mental Status: She is alert.  Psychiatric:        Mood and Affect: Mood normal.     ED Results / Procedures / Treatments   Labs (all labs ordered are listed, but only abnormal results are displayed) Labs Reviewed - No data to display  EKG None  Radiology DG Hand Complete Left Result Date: 07/12/2023 CLINICAL DATA:  Left hand pain, fall 2 days ago EXAM: LEFT HAND - COMPLETE 3+ VIEW COMPARISON:  None Available. FINDINGS: Mild spurring at the first carpometacarpal articulation. No fracture or acute bony findings identified. IMPRESSION: 1. Mild spurring at the first carpometacarpal articulation. No acute findings. Electronically Signed   By: Gaylyn Rong M.D.   On: 07/12/2023 19:02   DG Wrist Complete Left Result Date: 07/12/2023 CLINICAL DATA:  Left wrist pain, fall 2 days ago EXAM: LEFT WRIST - COMPLETE 3+ VIEW COMPARISON:  None Available. FINDINGS: There is no evidence of fracture or dislocation. There is no evidence of arthropathy or  other focal bone abnormality. Soft tissues are unremarkable. IMPRESSION: Negative. Electronically Signed   By: Gaylyn Rong M.D.   On: 07/12/2023 19:01    Procedures Procedures    Medications Ordered in ED Medications - No data to display  ED Course/ Medical Decision Making/ A&P                                 Medical Decision Making Amount and/or Complexity of Data Reviewed Radiology: ordered.   This patient presents to the ED for concern of wrist pain, this involves an extensive number of treatment options, and  is a complaint that carries with it a high risk of complications and morbidity.  The differential diagnosis includes fracture, strain/pain, dislocation ligament/tendinous injury, neurovasc compromise [facility] clinic, DVT, other   Co morbidities that complicate the patient evaluation  See HPI   Additional history obtained:  Additional history obtained from EMR External records from outside source obtained and reviewed including hospital records   Lab Tests:  N/a   Imaging Studies ordered:  I ordered imaging studies including x-ray left hand/wrist I independently visualized and interpreted imaging which showed no acute abnormality.  Osteoarthritis. I agree with the radiologist interpretation   Cardiac Monitoring: / EKG:  The patient was maintained on a cardiac monitor.  I personally viewed and interpreted the cardiac monitored which showed an underlying rhythm of: Sinus rhythm   Consultations Obtained:  N/a   Problem List / ED Course / Critical interventions / Medication management  Fall, left wrist pain Reevaluation of the patient showed that the patient stayed the same I have reviewed the patients home medicines and have made adjustments as needed   Social Determinants of Health:  Denies tobacco, licit drug use.   Test / Admission - Considered:  Fall, left wrist pain Vitals signs within normal range and stable throughout visit. Imaging  studies significant for: See above 63 year old female presents emergency department complaints of left-sided wrist/hand pain.  States that she was in the bathroom few days ago when her dog ran beneath her feet causing her to lose her balance falling backwards.  States that she braced her fall with her left hand.  Denies trauma to head, LOC.  Denies trauma elsewhere.  States that she has been trying to treat her symptoms of the counter medication but does continue to have pain and swelling and is concerned about potential fracture.  Denies any chest pain, shortness of breath, abdominal pain, nausea, vomiting.  Denies blood thinner use. On exam, treatment palpation distal radius and ulna.  No anatomical snuffbox tenderness or pain with axial loading of the thumb; low suspicion for scaphoid fracture.  No evidence of neurovascular compromise.  No overlying skin changes concerning for secondary infectious process.  X-rays are obtained which are negative for any acute osseous abnormality.  Suspect wrist sprain.  Placed in wrist brace while emergency department.  Recommend rest, ice, elevation, Tylenol/NSAIDs for treatment of pain.  Will give patient hand orthopedic follow-up information if patient still symptomatic despite symptomatic therapy.  Treatment plan discussed length with patient and she acknowledged understanding was agreeable to said plan.  Patient will well-appearing, afebrile in no acute distress. Worrisome signs and symptoms were discussed with the patient, and the patient acknowledged understanding to return to the ED if noticed. Patient was stable upon discharge.          Final Clinical Impression(s) / ED Diagnoses Final diagnoses:  Left wrist pain    Rx / DC Orders ED Discharge Orders     None         Peter Garter, [place] 07/12/23 1917    Terald Sleeper, MD 07/12/23 2015

## 2023-07-12 NOTE — Progress Notes (Signed)
 Leslie Benson [id number] 1960/08/14 63 y.o.  Subjective:   Patient ID:  Leslie Benson is a 63 y.o. (DOB 1961-01-14) female.  Chief Complaint: No chief complaint on file.   HPI Leslie Benson presents to the office today for follow-up of GAD, MDD, panic attacks and insomnia.  Describes mood today as "ok". Pleasant. Tearful at times. Mood symptoms - reports some depression - husband and children. Reports varying interest and motivation. Reports anxiety "feels scared and worried all the time". Reports irritability - frustration. Reports panic attacks - "not a lot". Reports some worry, rumination and over thinking. Reports family stressors - children not speaking to her, Reports chronic pain issues. Mood is lower. Stating "I don't feel too good anymore - mentally or physically". Feels like medications are helpful. Taking medications as prescribed.  Energy levels lower. Active, does not have a regular exercise routine.   Enjoys some usual interests and activities. Married. Lives with husband. Has 3 grown daughters - 2 grandchildren. Spending time with family. Talking to sisters. Reports appetite is adequate. Reports weight gain.   Sleeps better some nights than others. Averages 4 to 6 hours with Seroquel.   Reports some difficulties with focus and concentration Completing tasks. Managing some aspects of household. Receives SSI disability benefits for Fibromyalgia - 20 years.  Denies SI or HI.  Denies AH or VH. Denies self harm. Denies substance use.  Previous medication trials: Unknown   GAD-7    Flowsheet Row Office Visit from 02/12/2023 in [facility] [place]  Total GAD-7 Score 10      PHQ2-9    Flowsheet Row Office Visit from 02/12/2023 in [facility] [place] Office Visit from 08/09/2022 in [facility] [place] [facility] [place] Office Visit from 04[facility]28/2022 in [facility] [facility] [place] Office Visit  from 12/07/2021 in [facility] [place] Video Visit from 07/27/2021 in [facility] [place]  PHQ-2 Total Score 4 2 3 3 6   PHQ-9 Total Score 7 6 7 8 9       Flowsheet Row ED from 06/20/2023 in [facility] ED from 04/18/2023 in [facility] ED from 05/12/2022 in [facility] at [facility]  C-SSRS RISK CATEGORY No Risk No Risk No Risk        Review of Systems:  Review of Systems  Musculoskeletal:  Negative for gait problem.  Neurological:  Negative for tremors.  Psychiatric/Behavioral:         Please refer to HPI    Medications: I have reviewed the patient's current medications.  Current Outpatient Medications  Medication Sig Dispense Refill   ALPRAZolam (XANAX) 1 MG tablet TAKE 1 TABLET FIVE TIMES DAILY FOR ANXIETY AND PANIC ATTACKS 150 tablet 2   atorvastatin (LIPITOR) 40 MG tablet TAKE 1 TABLET EVERY DAY (KEEP APPOINTMENT ON 05/14/2023 WITH DR. Anne Fu FOR FURTHER REFILLS) 90 tablet 3   dicyclomine (BENTYL) 20 MG tablet Take 1 tablet (20 mg total) by mouth every 12 (twelve) hours as needed (for abdominal pain/cramping). 15 tablet 0   ibuprofen (ADVIL) 800 MG tablet Take 800 mg by mouth 4 (four) times daily.     levothyroxine (SYNTHROID) 25 MCG tablet TAKE 1 TABLET EVERY DAY BEFORE BREAKFAST 90 tablet 3   lidocaine (LIDODERM) 5 % Place 1 patch onto the skin daily. Applied to lower back area for 12 hours and remove  for 12 hours 7 patch 0   meclizine (ANTIVERT) 25 MG tablet Take 1 tablet (25 mg total) by mouth 3 (three) times daily as needed for dizziness. 30 tablet 1   Multiple Vitamin (MULTIVITAMIN ADULT PO) Take 1 tablet by mouth daily.     naproxen (NAPROSYN) 375 MG tablet Take 1 tablet (375 mg total) by mouth 2 (two) times daily as needed (back pain). 14 tablet 0   omega-3 acid ethyl esters (LOVAZA) 1 G capsule Take 1 g by  mouth 2 (two) times daily.     omeprazole (PRILOSEC) 20 MG capsule Take 1 capsule (20 mg total) by mouth daily. 90 capsule 3   omeprazole (PRILOSEC) 40 MG capsule TAKE 1 CAPSULE EVERY DAY 90 capsule 1   ondansetron (ZOFRAN-ODT) 4 MG disintegrating tablet Take 1 tablet (4 mg total) by mouth every 8 (eight) hours as needed for nausea or vomiting. 10 tablet 0   OVER THE COUNTER MEDICATION Take 1 tablet by mouth daily. Fiber Well     pregabalin (LYRICA) 75 MG capsule TAKE 1 CAPSULE TWICE DAILY. 60 capsule 2   QUEtiapine (SEROQUEL) 400 MG tablet TAKE 2 TABLETS AT BEDTIME 180 tablet 3   Semaglutide, 1 MG/DOSE, (OZEMPIC, 1 MG/DOSE,) 4 MG/3ML SOPN INJECT 1MG  UNDER THE SKIN ONE TIME WEEKLY 3 mL 3   venlafaxine XR (EFFEXOR-XR) 75 MG 24 hr capsule TAKE 3 CAPSULES EVERY DAY 270 capsule 3   No current facility-administered medications for this visit.    Medication Side Effects: None  Allergies:  Allergies  Allergen Reactions   Codeine Nausea And Vomiting   Compazine [Prochlorperazine Edisylate]     "feels weird"   Sulfa Antibiotics     Unknown reaction.    Past Medical History:  Diagnosis Date   ADHD    Allergy    Anemia    past hx    Anxiety    Barrett esophagus    Breast nodule    benign   Cervical arthritis 09/22/2013   Depression    Elevated liver enzymes    Fatty liver    Fibromyalgia    Fibromyalgia    GERD (gastroesophageal reflux disease)    H/O hiatal hernia    Headache(784.0)    migraines   High cholesterol    History of UTI    Lymphocytic colitis    Migraines    Pain    Panic attack    Paraesophageal hiatal hernia    repaired 2009   Spinal stenosis    Thyroid disease    Thyroid disease    Vertigo     Past Medical History, Surgical history, Social history, and Family history were reviewed and updated as appropriate.   Please see review of systems for further details on the patient's review from today.   Objective:   Physical Exam:  There were no vitals  taken for this visit.  Physical Exam Constitutional:      General: She is not in acute distress. Musculoskeletal:        General: No deformity.  Neurological:     Mental Status: She is alert and oriented to person, place, and time.     Coordination: Coordination normal.  Psychiatric:        Attention and Perception: Attention and perception normal. She does not perceive auditory or visual hallucinations.        Mood and Affect: Affect is not blunt, angry or inappropriate.        Speech: Speech normal.  Behavior: Behavior normal.        Thought Content: Thought content normal. Thought content is not paranoid or delusional. Thought content does not include homicidal or suicidal ideation. Thought content does not include homicidal or suicidal plan.        Cognition and Memory: Cognition and memory normal.        Judgment: Judgment normal.     Comments: Insight intact     Lab Review:     Component Value Date/Time   NA 135 04/18/2023 0037   NA 141 10/01/2019 1323   K 3.8 04/18/2023 0037   CL 104 04/18/2023 0037   CO2 22 04/18/2023 0037   GLUCOSE 89 04/18/2023 0037   BUN 13 04/18/2023 0037   BUN 10 10/01/2019 1323   CREATININE 0.86 04/18/2023 0037   CALCIUM 8.8 (L) 04/18/2023 0037   PROT 7.5 04/18/2023 0037   PROT 7.2 09/25/2018 1125   ALBUMIN 4.3 04/18/2023 0037   ALBUMIN 4.8 09/25/2018 1125   AST 20 04/18/2023 0037   ALT 21 04/18/2023 0037   ALKPHOS 103 04/18/2023 0037   BILITOT 0.2 04/18/2023 0037   BILITOT 0.3 09/25/2018 1125   GFRNONAA >60 04/18/2023 0037   GFRAA 67 10/01/2019 1323       Component Value Date/Time   WBC 7.3 04/18/2023 0037   RBC 4.35 04/18/2023 0037   HGB 13.4 04/18/2023 0037   HCT 41.3 04/18/2023 0037   PLT 272 04/18/2023 0037   MCV 94.9 04/18/2023 0037   MCH 30.8 04/18/2023 0037   MCHC 32.4 04/18/2023 0037   RDW 12.7 04/18/2023 0037   LYMPHSABS 2.4 05/12/2022 2211   MONOABS 0.5 05/12/2022 2211   EOSABS 0.1 05/12/2022 2211   BASOSABS  0.0 05/12/2022 2211    No results found for: "POCLITH", "LITHIUM"   No results found for: "PHENYTOIN", "PHENOBARB", "VALPROATE", "CBMZ"   .res Assessment: Plan:    Plan:  PDMP reviewed  1. Xanax 1mg  - 5 times daily - discussed continuing taper, but patient is struggling with increase anxiety. 2. Seroquel 400mg  - 2 at hs  3. Effexor XR 75mg  TID  RTC 6 months - will call in 3 months for next Xanax prescription.  25 minutes spent dedicated to the care of this patient on the date of this encounter to include pre-visit review of records, ordering of medication, post visit documentation, and face-to-face time with the patient discussing GAD, MDD, panic attacks and insomnia. Discussed continuing current medication regimen.  Patient advised to contact office with any questions, adverse effects, or acute worsening in signs and symptoms.  Discussed potential benefits, risk, and side effects of benzodiazepines to include potential risk of tolerance and dependence, as well as possible drowsiness.  Advised patient not to drive if experiencing drowsiness and to take lowest possible effective dose to minimize risk of dependence and tolerance.  Discussed potential metabolic side effects associated with atypical antipsychotics, as well as potential risk for movement side effects. Advised pt to contact office if movement side effects occur.   There are no diagnoses linked to this encounter.   Please see After Visit Summary for patient specific instructions.  Future Appointments  Date Time Provider Department Center  07/12/2023  4:30 PM Makaiah Terwilliger, Thereasa Solo, NP CP-CP None    No orders of the defined types were placed in this encounter.   -------------------------------

## 2023-08-06 ENCOUNTER — Other Ambulatory Visit: Payer: Self-pay | Admitting: Adult Health

## 2023-08-08 NOTE — Telephone Encounter (Signed)
 Ok to fill on Cory's behalf?

## 2023-08-13 ENCOUNTER — Telehealth: Payer: Self-pay

## 2023-08-13 NOTE — Telephone Encounter (Signed)
 Copied from CRM [id number]. Topic: Clinical - Prescription Issue >> Aug 13, 2023  3:24 PM Chuck Crater wrote: Reason for CRM: Patient is checking the status of her prescription for pregabalin  (LYRICA ) 75 MG capsule. She also wants to give [facility] number if needed. **[phone]

## 2023-08-14 ENCOUNTER — Other Ambulatory Visit: Payer: Self-pay | Admitting: Adult Health

## 2023-08-14 MED ORDER — PREGABALIN 75 MG PO CAPS: 75.0000 mg | ORAL_CAPSULE | Freq: Two times a day (BID) | ORAL | 2 refills | Status: DC

## 2023-08-14 NOTE — Telephone Encounter (Signed)
 Okay for refill?

## 2023-08-14 NOTE — Telephone Encounter (Signed)
 Pt does have an appointment tomorrow. Tried to call to see if it could til her appt. But no answer.

## 2023-08-17 ENCOUNTER — Ambulatory Visit (INDEPENDENT_AMBULATORY_CARE_PROVIDER_SITE_OTHER): Admitting: Adult Health

## 2023-08-17 ENCOUNTER — Encounter: Payer: Self-pay | Admitting: Adult Health

## 2023-08-17 ENCOUNTER — Ambulatory Visit

## 2023-08-17 VITALS — BP 92/60 | HR 97 | Temp 98.2°F | Ht 63.75 in | Wt 196.0 lb

## 2023-08-17 DIAGNOSIS — M797 Fibromyalgia: Secondary | ICD-10-CM | POA: Diagnosis not present

## 2023-08-17 DIAGNOSIS — M542 Cervicalgia: Secondary | ICD-10-CM | POA: Diagnosis not present

## 2023-08-17 DIAGNOSIS — E785 Hyperlipidemia, unspecified: Secondary | ICD-10-CM

## 2023-08-17 DIAGNOSIS — E669 Obesity, unspecified: Secondary | ICD-10-CM

## 2023-08-17 DIAGNOSIS — E038 Other specified hypothyroidism: Secondary | ICD-10-CM

## 2023-08-17 DIAGNOSIS — F32A Depression, unspecified: Secondary | ICD-10-CM

## 2023-08-17 DIAGNOSIS — R7303 Prediabetes: Secondary | ICD-10-CM | POA: Diagnosis not present

## 2023-08-17 DIAGNOSIS — M47812 Spondylosis without myelopathy or radiculopathy, cervical region: Secondary | ICD-10-CM | POA: Diagnosis not present

## 2023-08-17 DIAGNOSIS — M545 Low back pain, unspecified: Secondary | ICD-10-CM

## 2023-08-17 DIAGNOSIS — R8281 Pyuria: Secondary | ICD-10-CM | POA: Diagnosis not present

## 2023-08-17 DIAGNOSIS — F419 Anxiety disorder, unspecified: Secondary | ICD-10-CM

## 2023-08-17 DIAGNOSIS — R300 Dysuria: Secondary | ICD-10-CM | POA: Diagnosis not present

## 2023-08-17 DIAGNOSIS — Z0001 Encounter for general adult medical examination with abnormal findings: Secondary | ICD-10-CM | POA: Diagnosis not present

## 2023-08-17 DIAGNOSIS — Z0000 Encounter for general adult medical examination without abnormal findings: Secondary | ICD-10-CM

## 2023-08-17 DIAGNOSIS — G8929 Other chronic pain: Secondary | ICD-10-CM | POA: Diagnosis not present

## 2023-08-17 LAB — COMPREHENSIVE METABOLIC PANEL WITH GFR
ALT: 15 U/L (ref 0–35)
AST: 18 U/L (ref 0–37)
Albumin: 4.4 g/dL (ref 3.5–5.2)
Alkaline Phosphatase: 115 U/L (ref 39–117)
BUN: 11 mg/dL (ref 6–23)
CO2: 29 meq/L (ref 19–32)
Calcium: 9.4 mg/dL (ref 8.4–10.5)
Chloride: 103 meq/L (ref 96–112)
Creatinine, Ser: 1.15 mg/dL (ref 0.40–1.20)
GFR: 50.33 mL/min — ABNORMAL LOW (ref >60.00)
Glucose, Bld: 93 mg/dL (ref 70–99)
Potassium: 4.1 meq/L (ref 3.5–5.1)
Sodium: 142 meq/L (ref 135–145)
Total Bilirubin: 0.4 mg/dL (ref 0.2–1.2)
Total Protein: 7 g/dL (ref 6.0–8.3)

## 2023-08-17 LAB — CBC WITH DIFFERENTIAL/PLATELET
Basophils Absolute: 0 10*3/uL (ref 0.0–0.1)
Basophils Relative: 0.5 % (ref 0.0–3.0)
Eosinophils Absolute: 0.1 10*3/uL (ref 0.0–0.7)
Eosinophils Relative: 0.9 % (ref 0.0–5.0)
HCT: 39.7 % (ref 36.0–46.0)
Hemoglobin: 13.2 g/dL (ref 12.0–15.0)
Lymphocytes Relative: 13.4 % (ref 12.0–46.0)
Lymphs Abs: 1.2 10*3/uL (ref 0.7–4.0)
MCHC: 33.3 g/dL (ref 30.0–36.0)
MCV: 92 fl (ref 78.0–100.0)
Monocytes Absolute: 0.8 10*3/uL (ref 0.1–1.0)
Monocytes Relative: 8.7 % (ref 3.0–12.0)
Neutro Abs: 7.1 10*3/uL (ref 1.4–7.7)
Neutrophils Relative %: 76.5 % (ref 43.0–77.0)
Platelets: 215 10*3/uL (ref 150.0–400.0)
RBC: 4.32 Mil/uL (ref 3.87–5.11)
RDW: 14.5 % (ref 11.5–15.5)
WBC: 9.3 10*3/uL (ref 4.0–10.5)

## 2023-08-17 LAB — HEMOGLOBIN A1C: Hgb A1c MFr Bld: 5.4 % (ref 4.6–6.5)

## 2023-08-17 LAB — POCT URINALYSIS DIPSTICK
Bilirubin, UA: POSITIVE
Blood, UA: NEGATIVE
Glucose, UA: NEGATIVE
Ketones, UA: NEGATIVE
Nitrite, UA: NEGATIVE
Protein, UA: POSITIVE — AB
Spec Grav, UA: 1.02 (ref 1.010–1.025)
Urobilinogen, UA: 0.2 U/dL
pH, UA: 6 (ref 5.0–8.0)

## 2023-08-17 LAB — LIPID PANEL
Cholesterol: 172 mg/dL (ref 0–200)
HDL: 86.5 mg/dL (ref >39.00)
LDL Cholesterol: 68 mg/dL (ref 0–99)
NonHDL: 85.66
Total CHOL/HDL Ratio: 2
Triglycerides: 89 mg/dL (ref 0.0–149.0)
VLDL: 17.8 mg/dL (ref 0.0–40.0)

## 2023-08-17 LAB — TSH: TSH: 1.61 u[IU]/mL (ref 0.35–5.50)

## 2023-08-17 MED ORDER — SEMAGLUTIDE (2 MG/DOSE) 8 MG/3ML ~~LOC~~ SOPN: 2.0000 mg | PEN_INJECTOR | SUBCUTANEOUS | 0 refills | Status: DC

## 2023-08-17 NOTE — Progress Notes (Signed)
 Subjective:    Patient ID: Leslie Benson, female    DOB: 06-25-1960, 63 y.o.   MRN: [id number]  HPI Patient presents for yearly preventative medicine examination. She is a pleasant 63 year old female who  has a past medical history of ADHD, Allergy, Anemia, Anxiety, Barrett esophagus, Breast nodule, Cervical arthritis (09/22/2013), Depression, Elevated liver enzymes, Fatty liver, Fibromyalgia, Fibromyalgia, GERD (gastroesophageal reflux disease), H/O hiatal hernia, Headache(784.0), High cholesterol, History of UTI, Lymphocytic colitis, Migraines, Pain, Panic attack, Paraesophageal hiatal hernia, Spinal stenosis, Thyroid  disease, Thyroid  disease, and Vertigo.  Hyperlipidemia-managed with Lipitor 40 mg daily and Lovaza  1 g twice daily.  She denies myalgia or fatigue.  This is managed by cardiology Lab Results  Component Value Date   CHOL 167 04/28/2022   HDL 78.30 04/28/2022   LDLCALC 72 04/28/2022   LDLDIRECT 168.0 04/05/2017   TRIG 79.0 04/28/2022   CHOLHDL 2 04/28/2022   Anxiety/depression-seen by psychiatry.  Currently prescribed Effexor , Xanax , and Seroquel .  She does not feel well-controlled on her current medication.  She has pretty constant depression and anxiety a lot of her stressors come from not having a good relationship with her husband nor her kids.  She feels as though she needs to move out of her house to be away from her husband and her kids do not talk to her.  She reports not sleeping for the last 2 nights because she was stressed out about her children.  Fibromyalgia -managed with Lyrica  75 mg twice daily. She continues to have generalized fatigue   Hypothyroidism - takes synthroid  25 mg daily  Lab Results  Component Value Date   TSH 1.19 04/28/2022   Prediabetes -in the past she was on Ozempic  1 mg weekly.  She has been tolerating this medication well with no side effects.  She is eating healthier and has not been able to lose weight.  Lab Results  Component  Value Date   HGBA1C 5.4 04/28/2022   HGBA1C 5.1 01/31/2022   HGBA1C 5.4 06/22/2021   Obesity - She is currently on Ozempic  1 mg. She continues to take it but still has trouble losing weight. She does not exercise due to chronic back pain. Orthopedics wants to send her to PT but she reports that she cannot afford it.   Chronic Back Pain -has been ongoing for multiple years.  She has been seen by orthopedics in the past, they wanted her to go to physical therapy but she could not afford it.  She reports that she has trouble walking and after a short period of time her low back and upper back are very painful and she has to rest.  She reports not be able to exercise due to this pain.  She is interested in seeing a different orthopedic doctor to see if there is anything that they can do.  She did have a lumbar spine x-ray in February this year which was negative.  All immunizations and health maintenance protocols were reviewed with the patient and needed orders were placed.  Appropriate screening laboratory values were ordered for the patient including screening of hyperlipidemia, renal function and hepatic function.  Medication reconciliation,  past medical history, social history, problem list and allergies were reviewed in detail with the patient  Goals were established with regard to weight loss, exercise, and  diet in compliance with medications Wt Readings from Last 3 Encounters:  08/17/23 196 lb (88.9 kg)  07/12/23 195 lb (88.5 kg)  05/14/23 189 lb (85.7  kg)   She is up to date on routine colon cancer screening. She reports having her mammogram scheduled.   Review of Systems  Constitutional:  Positive for fatigue.  HENT: Negative.    Eyes: Negative.   Respiratory: Negative.    Cardiovascular: Negative.   Gastrointestinal: Negative.   Endocrine: Negative.   Genitourinary: Negative.   Musculoskeletal:  Positive for arthralgias, back pain and myalgias.  Skin: Negative.    Allergic/Immunologic: Negative.   Neurological:  Positive for weakness.  Hematological: Negative.   Psychiatric/Behavioral:  Positive for dysphoric mood and sleep disturbance. Negative for self-injury and suicidal ideas. The patient is nervous/anxious.    Past Medical History:  Diagnosis Date   ADHD    Allergy    Anemia    past hx    Anxiety    Barrett esophagus    Breast nodule    benign   Cervical arthritis 09/22/2013   Depression    Elevated liver enzymes    Fatty liver    Fibromyalgia    Fibromyalgia    GERD (gastroesophageal reflux disease)    H/O hiatal hernia    Headache(784.0)    migraines   High cholesterol    History of UTI    Lymphocytic colitis    Migraines    Pain    Panic attack    Paraesophageal hiatal hernia    repaired 2009   Spinal stenosis    Thyroid  disease    Thyroid  disease    Vertigo     Social History   Socioeconomic History   Marital status: Married    Spouse name: Therapist, nutritional   Number of children: 3   Years of education: Not on file   Highest education level: Not on file  Occupational History   Occupation: disabled  Tobacco Use   Smoking status: Never   Smokeless tobacco: Never  Vaping Use   Vaping status: Never Used  Substance and Sexual Activity   Alcohol use: Not Currently    Comment: wine occassionally   Drug use: No   Sexual activity: Yes    Birth control/protection: None    Comment: n/a partial hysterectomy  Other Topics Concern   Not on file  Social History Narrative   Married    Lived in [place] .  Moved to [place] in early 2000s   She is on disability for anxiety    Social Drivers of Health   Financial Resource Strain: Low Risk  (05/01/2023)   Overall Financial Resource Strain (CARDIA)    Difficulty of Paying Living Expenses: Not hard at all  Food Insecurity: No Food Insecurity (05/01/2023)   Hunger Vital Sign    Worried About Running Out of Food in the Last Year: Never true    Ran Out of Food in the Last Year:  Never true  Transportation Needs: No Transportation Needs (05/01/2023)   PRAPARE - Administrator, Civil Service (Medical): No    Lack of Transportation (Non-Medical): No  Physical Activity: Inactive (12/07/2021)   Exercise Vital Sign    Days of Exercise per Week: 0 days    Minutes of Exercise per Session: 0 min  Stress: Stress Concern Present (12/07/2021)   [employer] of Occupational Health - Occupational Stress Questionnaire    Feeling of Stress : Rather much  Social Connections: Moderately Isolated (12/07/2021)   Social Connection and Isolation Panel [NHANES]    Frequency of Communication with Friends and Family: More than three times a week    Frequency  of Social Gatherings with Friends and Family: More than three times a week    Attends Religious Services: Never    Database administrator or Organizations: No    Attends Banker Meetings: Never    Marital Status: Married  Catering manager Violence: Not At Risk (12/07/2021)   Humiliation, Afraid, Rape, and Kick questionnaire    Fear of Current or Ex-Partner: No    Emotionally Abused: No    Physically Abused: No    Sexually Abused: No    Past Surgical History:  Procedure Laterality Date   ANKLE SURGERY     CHOLECYSTECTOMY  1980s?   COLONOSCOPY  2010   Buccini   EUS N/A 11/19/2013   Procedure: ESOPHAGEAL ENDOSCOPIC ULTRASOUND (EUS) RADIAL;  Surgeon: Evangeline Hilts, MD;  Location: [facility];  Service: Endoscopy;  Laterality: N/A;   LAPAROSCOPIC NISSEN FUNDOPLICATION  03/05/2008   LAPAROSCOPIC PARAESOPHAGEAL HERNIA REPAIR  03/05/2008   Dr Hershell Lose   TUBAL LIGATION  1990s?   UPPER GASTROINTESTINAL ENDOSCOPY  04/05/2018   Legrand Puma    VAGINAL HYSTERECTOMY  03/04/2001    Family History  Problem Relation Age of Onset   Alcohol abuse Other    Elevated Lipids Other    Heart disease Other    Anxiety disorder Mother    Obesity Mother    High Cholesterol Mother    Thyroid  cancer Mother    Depression  Mother    Breast cancer Mother    Bone cancer Mother    High Cholesterol Father    Heart disease Father    Alcoholism Father    Colon cancer Maternal Aunt    Colon polyps Sister    Stomach cancer Neg Hx    Esophageal cancer Neg Hx    Rectal cancer Neg Hx     Allergies  Allergen Reactions   Codeine Nausea And Vomiting   Compazine [Prochlorperazine Edisylate]     "feels weird"   Sulfa Antibiotics     Unknown reaction.    Current Outpatient Medications on File Prior to Visit  Medication Sig Dispense Refill   ALPRAZolam  (XANAX ) 1 MG tablet TAKE 1 TABLET FIVE TIMES DAILY FOR ANXIETY AND PANIC ATTACKS 150 tablet 2   atorvastatin  (LIPITOR) 40 MG tablet TAKE 1 TABLET EVERY DAY (KEEP APPOINTMENT ON 05/14/2023 WITH DR. Renna Cary FOR FURTHER REFILLS) 90 tablet 3   dicyclomine  (BENTYL ) 20 MG tablet Take 1 tablet (20 mg total) by mouth every 12 (twelve) hours as needed (for abdominal pain/cramping). 15 tablet 0   ibuprofen  (ADVIL ) 800 MG tablet Take 800 mg by mouth 4 (four) times daily.     levothyroxine  (SYNTHROID ) 25 MCG tablet TAKE 1 TABLET EVERY DAY BEFORE BREAKFAST 90 tablet 3   lidocaine  (LIDODERM ) 5 % Place 1 patch onto the skin daily. Applied to lower back area for 12 hours and remove for 12 hours 7 patch 0   meclizine  (ANTIVERT ) 25 MG tablet Take 1 tablet (25 mg total) by mouth 3 (three) times daily as needed for dizziness. 30 tablet 1   Multiple Vitamin (MULTIVITAMIN ADULT PO) Take 1 tablet by mouth daily.     naproxen  (NAPROSYN ) 375 MG tablet Take 1 tablet (375 mg total) by mouth 2 (two) times daily as needed (back pain). 14 tablet 0   omega-3 acid ethyl esters (LOVAZA ) 1 G capsule Take 1 g by mouth 2 (two) times daily.     omeprazole  (PRILOSEC) 20 MG capsule Take 1 capsule (20 mg total) by mouth daily.  90 capsule 3   omeprazole  (PRILOSEC) 40 MG capsule TAKE 1 CAPSULE EVERY DAY 90 capsule 1   ondansetron  (ZOFRAN -ODT) 4 MG disintegrating tablet Take 1 tablet (4 mg total) by mouth every 8  (eight) hours as needed for nausea or vomiting. 10 tablet 0   OVER THE COUNTER MEDICATION Take 1 tablet by mouth daily. Fiber Well     pregabalin  (LYRICA ) 75 MG capsule Take 1 capsule (75 mg total) by mouth 2 (two) times daily. 60 capsule 2   QUEtiapine  (SEROQUEL ) 400 MG tablet Take 2 tablets (800 mg total) by mouth at bedtime. 180 tablet 3   Semaglutide , 1 MG/DOSE, (OZEMPIC , 1 MG/DOSE,) 4 MG/3ML SOPN INJECT 1MG  UNDER THE SKIN ONE TIME WEEKLY 3 mL 3   venlafaxine  XR (EFFEXOR -XR) 75 MG 24 hr capsule TAKE 3 CAPSULES EVERY DAY 270 capsule 3   No current facility-administered medications on file prior to visit.    BP 92/60   Pulse 97   Temp 98.2 F (36.8 C) (Oral)   Ht 5' 3.75" (1.619 m)   Wt 196 lb (88.9 kg)   SpO2 98%   BMI 33.91 kg/m       Objective:   Physical Exam Vitals and nursing note reviewed.  Constitutional:      General: She is not in acute distress.    Appearance: Normal appearance. She is not ill-appearing.  HENT:     Head: Normocephalic and atraumatic.     Right Ear: Tympanic membrane, ear canal and external ear normal. There is no impacted cerumen.     Left Ear: Tympanic membrane, ear canal and external ear normal. There is no impacted cerumen.     Nose: Nose normal. No congestion or rhinorrhea.     Mouth/Throat:     Mouth: Mucous membranes are moist.     Pharynx: Oropharynx is clear.  Eyes:     Extraocular Movements: Extraocular movements intact.     Conjunctiva/sclera: Conjunctivae normal.     Pupils: Pupils are equal, round, and reactive to light.  Neck:     Vascular: No carotid bruit.  Cardiovascular:     Rate and Rhythm: Normal rate and regular rhythm.     Pulses: Normal pulses.     Heart sounds: No murmur heard.    No friction rub. No gallop.  Pulmonary:     Effort: Pulmonary effort is normal.     Breath sounds: Normal breath sounds.  Abdominal:     General: Abdomen is flat. Bowel sounds are normal. There is no distension.     Palpations: Abdomen  is soft. There is no mass.     Tenderness: There is no abdominal tenderness. There is no guarding or rebound.     Hernia: No hernia is present.  Musculoskeletal:     Cervical back: Neck supple. Tenderness and bony tenderness present. No edema, erythema, torticollis or crepitus. Pain with movement present. Decreased range of motion.     Lumbar back: Tenderness present. No bony tenderness. Normal range of motion.       Back:  Lymphadenopathy:     Cervical: No cervical adenopathy.  Skin:    General: Skin is warm and dry.     Capillary Refill: Capillary refill takes less than 2 seconds.  Neurological:     General: No focal deficit present.     Mental Status: She is alert and oriented to person, place, and time.  Psychiatric:        Mood and Affect: Mood normal.  Behavior: Behavior normal.        Thought Content: Thought content normal.        Judgment: Judgment normal.        Assessment & Plan:   1. Routine general medical examination at a health care facility (Primary) Today patient counseled on age appropriate routine health concerns for screening and prevention, each reviewed and up to date or declined. Immunizations reviewed and up to date or declined. Labs ordered and reviewed. Risk factors for depression reviewed and negative. Hearing function and visual acuity are intact. ADLs screened and addressed as needed. Functional ability and level of safety reviewed and appropriate. Education, counseling and referrals performed based on assessed risks today. Patient provided with a copy of personalized plan for preventive services. - Follow up in one year or sooner if needed   2. Hyperlipidemia, unspecified hyperlipidemia type - Continue current therapy  - CBC with Differential/Platelet; Future - Comprehensive metabolic panel with GFR; Future - Lipid panel; Future - TSH; Future - TSH - Lipid panel - Comprehensive metabolic panel with GFR - CBC with Differential/Platelet  3.  Anxiety and depression - Encouraged to follow up with psychiatry  - CBC with Differential/Platelet; Future - Comprehensive metabolic panel with GFR; Future - Lipid panel; Future - TSH; Future - TSH - Lipid panel - Comprehensive metabolic panel with GFR - CBC with Differential/Platelet  4. Fibromyalgia - Continue Lyrica  - CBC with Differential/Platelet; Future - Comprehensive metabolic panel with GFR; Future - Lipid panel; Future - TSH; Future - TSH - Lipid panel - Comprehensive metabolic panel with GFR - CBC with Differential/Platelet  5. Other specified hypothyroidism - Consider change in dose of synthroid   - CBC with Differential/Platelet; Future - Comprehensive metabolic panel with GFR; Future - Lipid panel; Future - TSH; Future - TSH - Lipid panel - Comprehensive metabolic panel with GFR - CBC with Differential/Platelet  6. Prediabetes - Follow up in 3 months  - Semaglutide , 2 MG/DOSE, 8 MG/3ML SOPN; Inject 2 mg as directed once a week.  Dispense: 9 mL; Refill: 0 - CBC with Differential/Platelet; Future - Comprehensive metabolic panel with GFR; Future - Lipid panel; Future - TSH; Future - Hemoglobin A1c; Future - Hemoglobin A1c - TSH - Lipid panel - Comprehensive metabolic panel with GFR - CBC with Differential/Platelet  7. Obesity (BMI 35.0-39.9 without comorbidity) - Will increase her Ozempic  to 2 mg.  - She does need to do some type of physical activity, I think it would make a big difference in her overall health and help with her chronic pain. - Semaglutide , 2 MG/DOSE, 8 MG/3ML SOPN; Inject 2 mg as directed once a week.  Dispense: 9 mL; Refill: 0 - CBC with Differential/Platelet; Future - Comprehensive metabolic panel with GFR; Future - Lipid panel; Future - TSH; Future - Hemoglobin A1c; Future - Hemoglobin A1c - TSH - Lipid panel - Comprehensive metabolic panel with GFR - CBC with Differential/Platelet  8. Chronic bilateral low back pain without  sciatica  - AMB referral to orthopedics  9. Cervical spine pain  - DG Cervical Spine Complete; Future - AMB referral to orthopedics  10. Pyuria  - POCT Urinalysis Dipstick + leuks and protein Denies symptoms. Will send culture  - Urine Culture; Future - Urine Culture

## 2023-08-17 NOTE — Patient Instructions (Signed)
It was great seeing you today   We will follow up with you regarding your lab work   Please let me know if you need anything   I will see you back in 3 months  

## 2023-08-18 LAB — URINE CULTURE
MICRO NUMBER:: 16376704
Result:: NO GROWTH
SPECIMEN QUALITY:: ADEQUATE

## 2023-08-28 ENCOUNTER — Ambulatory Visit: Admitting: Physical Medicine and Rehabilitation

## 2023-08-29 ENCOUNTER — Telehealth: Payer: Self-pay | Admitting: Adult Health

## 2023-08-29 NOTE — Telephone Encounter (Signed)
 Copied from CRM [id number]. Topic: General - Call Back - No Documentation >> Aug 29, 2023  4:19 PM [place] E wrote: Reason for CRM: Patient received a missed call from the office today 5/7, agent could not locate what this might have been in regards to. Callback number for patient is [phone].

## 2023-08-29 NOTE — Telephone Encounter (Signed)
 Called pt back no answer. Called pt for result notes.

## 2023-08-30 ENCOUNTER — Other Ambulatory Visit: Payer: Self-pay

## 2023-08-31 NOTE — Telephone Encounter (Signed)
 Patient notified of update  and verbalized understanding.

## 2023-10-10 ENCOUNTER — Other Ambulatory Visit: Payer: Self-pay | Admitting: Adult Health

## 2023-10-10 DIAGNOSIS — G4700 Insomnia, unspecified: Secondary | ICD-10-CM

## 2023-10-10 DIAGNOSIS — F410 Panic disorder [episodic paroxysmal anxiety] without agoraphobia: Secondary | ICD-10-CM

## 2023-10-17 ENCOUNTER — Encounter (HOSPITAL_BASED_OUTPATIENT_CLINIC_OR_DEPARTMENT_OTHER): Payer: Self-pay

## 2023-10-17 ENCOUNTER — Emergency Department (HOSPITAL_BASED_OUTPATIENT_CLINIC_OR_DEPARTMENT_OTHER)
Admission: EM | Admit: 2023-10-17 | Discharge: 2023-10-17 | Disposition: A | Discharge status: Home / Self Care | Attending: Emergency Medicine | Admitting: Emergency Medicine

## 2023-10-17 ENCOUNTER — Emergency Department (HOSPITAL_BASED_OUTPATIENT_CLINIC_OR_DEPARTMENT_OTHER): Admitting: Radiology

## 2023-10-17 ENCOUNTER — Other Ambulatory Visit: Payer: Self-pay

## 2023-10-17 DIAGNOSIS — R0789 Other chest pain: Secondary | ICD-10-CM | POA: Insufficient documentation

## 2023-10-17 DIAGNOSIS — R531 Weakness: Secondary | ICD-10-CM | POA: Insufficient documentation

## 2023-10-17 DIAGNOSIS — R079 Chest pain, unspecified: Secondary | ICD-10-CM | POA: Diagnosis not present

## 2023-10-17 LAB — TROPONIN T, HIGH SENSITIVITY
Troponin T High Sensitivity: <15 ng/L (ref <19)
Troponin T High Sensitivity: <15 ng/L (ref <19)

## 2023-10-17 LAB — CBC
HCT: 38.3 % (ref 36.0–46.0)
Hemoglobin: 12.7 g/dL (ref 12.0–15.0)
MCH: 30.2 pg (ref 26.0–34.0)
MCHC: 33.2 g/dL (ref 30.0–36.0)
MCV: 91.2 fL (ref 80.0–100.0)
Platelets: 221 10*3/uL (ref 150–400)
RBC: 4.2 MIL/uL (ref 3.87–5.11)
RDW: 13.4 % (ref 11.5–15.5)
WBC: 5.9 10*3/uL (ref 4.0–10.5)
nRBC: 0 % (ref 0.0–0.2)

## 2023-10-17 LAB — BASIC METABOLIC PANEL WITH GFR
Anion gap: 12 (ref 5–15)
BUN: 10 mg/dL (ref 8–23)
CO2: 23 mmol/L (ref 22–32)
Calcium: 9.3 mg/dL (ref 8.9–10.3)
Chloride: 103 mmol/L (ref 98–111)
Creatinine, Ser: 0.96 mg/dL (ref 0.44–1.00)
GFR, Estimated: >60 mL/min (ref >60)
Glucose, Bld: 96 mg/dL (ref 70–99)
Potassium: 4.2 mmol/L (ref 3.5–5.1)
Sodium: 138 mmol/L (ref 135–145)

## 2023-10-17 NOTE — ED Triage Notes (Addendum)
 Pt reports she is here today due to chest pain. Pt reports it started today while she was sitting. Pt reports central chest pressure. Pt reports dizziness with the pain.Pt reports the chest pain radiates to the back.

## 2023-10-17 NOTE — ED Notes (Signed)
 Pt d/c instructions, medications, and follow-up care reviewed with pt. Pt verbalized understanding and had no further questions at time of d/c. Pt CA&Ox4, ambulatory, and in NAD at time of d/c

## 2023-10-17 NOTE — Discharge Instructions (Signed)
 Follow-up with your primary care doctor.  Return if symptoms worsen.

## 2023-10-17 NOTE — ED Provider Notes (Signed)
 [facility] EMERGENCY DEPARTMENT AT [facility] Provider Note   CSN: [id number] Arrival date & time: 10/17/23  2000     Patient presents with: Chest Pain   Leslie Benson is a 63 y.o. female.   Patient here with chest pain started this morning.  Feeling better now.  She denies any shortness of breath weakness numbness tingling.  Felt a little bit weak today.  No headaches.  She has history of fibromyalgia panic attacks chronic fatigue.  She denies any fever chills cough sputum production.  No black or bloody stools.  No recent surgery or travel.  The history is provided by the patient.       Prior to Admission medications   Medication Sig Start Date End Date Taking? Authorizing Provider  ALPRAZolam  (XANAX ) 1 MG tablet TAKE 1 TABLET FIVE TIMES DAILY FOR ANXIETY AND PANIC ATTACKS 10/10/23   [name], Regina Nattalie, NP  atorvastatin  (LIPITOR) 40 MG tablet TAKE 1 TABLET EVERY DAY (KEEP APPOINTMENT ON 05/14/2023 WITH DR. JEFFRIE FOR FURTHER REFILLS) 06/01/23   JEFFRIE Oneil BROCKS, MD  dicyclomine  (BENTYL ) 20 MG tablet Take 1 tablet (20 mg total) by mouth every 12 (twelve) hours as needed (for abdominal pain/cramping). 04/18/23   Keith Sor, PA-C  ibuprofen  (ADVIL ) 800 MG tablet Take 800 mg by mouth 4 (four) times daily. 01/11/23   [provider]  levothyroxine  (SYNTHROID ) 25 MCG tablet TAKE 1 TABLET EVERY DAY BEFORE BREAKFAST 04/10/23   Nafziger, Darleene, NP  lidocaine  (LIDODERM ) 5 % Place 1 patch onto the skin daily. Applied to lower back area for 12 hours and remove for 12 hours 06/20/23   Mayer, Jodi R, NP  meclizine  (ANTIVERT ) 25 MG tablet Take 1 tablet (25 mg total) by mouth 3 (three) times daily as needed for dizziness. 11/15/22   Nafziger, Darleene, NP  Multiple Vitamin (MULTIVITAMIN ADULT PO) Take 1 tablet by mouth daily.    [provider]  naproxen  (NAPROSYN ) 375 MG tablet Take 1 tablet (375 mg total) by mouth 2 (two) times daily as needed (back pain). 06/20/23    Mayer, Jodi R, NP  omega-3 acid ethyl esters (LOVAZA ) 1 G capsule Take 1 g by mouth 2 (two) times daily.    [provider]  omeprazole  (PRILOSEC) 20 MG capsule Take 1 capsule (20 mg total) by mouth daily. 06/09/22   Beather Delon Gibson, PA  omeprazole  (PRILOSEC) 40 MG capsule TAKE 1 CAPSULE EVERY DAY 06/20/23   Abran Norleen SAILOR, MD  ondansetron  (ZOFRAN -ODT) 4 MG disintegrating tablet Take 1 tablet (4 mg total) by mouth every 8 (eight) hours as needed for nausea or vomiting. 04/18/23   Keith Sor, PA-C  OVER THE COUNTER MEDICATION Take 1 tablet by mouth daily. Fiber Well    [provider]  pregabalin  (LYRICA ) 75 MG capsule Take 1 capsule (75 mg total) by mouth 2 (two) times daily. 08/14/23   Nafziger, Darleene, NP  QUEtiapine  (SEROQUEL ) 400 MG tablet Take 2 tablets (800 mg total) by mouth at bedtime. 07/12/23   [name], Regina Nattalie, NP  Semaglutide , 2 MG/DOSE, 8 MG/3ML SOPN Inject 2 mg as directed once a week. 08/17/23   Nafziger, Cory, NP  venlafaxine  XR (EFFEXOR -XR) 75 MG 24 hr capsule TAKE 3 CAPSULES EVERY DAY 07/12/23   [name], Regina Nattalie, NP    Allergies: Codeine, Compazine [prochlorperazine edisylate], and Sulfa antibiotics    Review of Systems  Updated Vital Signs BP 97/73   Pulse 73   Temp 97.8 F (36.6 C) (Temporal)  Resp 19   SpO2 93%   Physical Exam Vitals and nursing note reviewed.  Constitutional:      General: She is not in acute distress.    Appearance: She is well-developed. She is not ill-appearing.  HENT:     Head: Normocephalic and atraumatic.   Eyes:     Extraocular Movements: Extraocular movements intact.     Conjunctiva/sclera: Conjunctivae normal.     Pupils: Pupils are equal, round, and reactive to light.    Cardiovascular:     Rate and Rhythm: Normal rate and regular rhythm.     Pulses:          Radial pulses are 2+ on the right side and 2+ on the left side.     Heart sounds: Normal heart sounds. No murmur heard. Pulmonary:      Effort: Pulmonary effort is normal. No respiratory distress.     Breath sounds: Normal breath sounds.  Abdominal:     Palpations: Abdomen is soft.     Tenderness: There is no abdominal tenderness.   Musculoskeletal:        General: No swelling. Normal range of motion.     Cervical back: Normal range of motion and neck supple.     Right lower leg: No edema.   Skin:    General: Skin is warm and dry.     Capillary Refill: Capillary refill takes less than 2 seconds.     Nails: There is no clubbing.   Neurological:     General: No focal deficit present.     Mental Status: She is alert and oriented to person, place, and time.     Cranial Nerves: No cranial nerve deficit.     Motor: No weakness.   Psychiatric:        Mood and Affect: Mood normal.     (all labs ordered are listed, but only abnormal results are displayed) Labs Reviewed  BASIC METABOLIC PANEL WITH GFR  CBC  TROPONIN T, HIGH SENSITIVITY  TROPONIN T, HIGH SENSITIVITY    EKG: EKG Interpretation Date/Time:  Wednesday October 17 2023 20:11:06 EDT Ventricular Rate:  95 PR Interval:  163 QRS Duration:  120 QT Interval:  350 QTC Calculation: 440 R Axis:   51  Text Interpretation: Sinus rhythm Confirmed by Ruthe Cornet [phone]) on 10/17/2023 8:12:39 PM  Radiology: [facility] Chest 2 View Result Date: 10/17/2023 CLINICAL DATA:  Chest pain EXAM: CHEST - 2 VIEW COMPARISON:  08/19/2014 FINDINGS: Cardiac shadow is within normal limits. The lungs are well aerated bilaterally. Mild scarring is noted in the bases bilaterally. No focal infiltrate or effusion is seen. No bony abnormality is noted. IMPRESSION: No active cardiopulmonary disease. Electronically Signed   By: Oneil Devonshire M.D.   On: 10/17/2023 21:26     Procedures   Medications Ordered in the ED - No data to display                                  Medical Decision Making Amount and/or Complexity of Data Reviewed Labs: ordered. Radiology: ordered.   Leslie Benson is here with chest pain weakness.  Normal vitals.  No fever.  History of fibromyalgia, high cholesterol, anxiety.  Overall atypical story.  She has not had any exertional symptoms.  Symptoms have improved.  She is got unremarkable vitals.  EKG shows sinus rhythm.  No ischemic changes.  Differential diagnosis likely stress related  process/GI related process versus less likely ACS.  No concern for dissection or PE.  She is Wells criteria 0 and doubt PE.  She has no infectious symptoms but will evaluate for pneumonia.  CBC BMP troponin chest x-ray obtained.  Per my review and interpretation there is no significant leukocytosis anemia or electrolyte abnormality per labs.  Troponin negative x 2.  Chest x-ray showed no evidence of pneumonia pneumothorax.  Overall recommend rest hydration follow-up with primary care.  She has high cholesterol but no other major cardiac risk factors.  Told to return if symptoms worsen.  Follow-up with PCP.  May need cardiology referral if she is having recurrent symptoms.  Discharge.  This chart was dictated using voice recognition software.  Despite best efforts to proofread,  errors can occur which can change the documentation meaning.      Final diagnoses:  Atypical chest pain    ED Discharge Orders     None          Ruthe Cornet, DO 10/17/23 2222

## 2023-10-19 ENCOUNTER — Telehealth: Payer: Self-pay

## 2023-10-19 NOTE — Transitions of Care (Post Inpatient/ED Visit) (Signed)
   10/19/2023  Name: Leslie Benson MRN: [id number] DOB: 1960/11/05  Today's TOC FU Call Status:    Attempted to reach the patient regarding the most recent Inpatient/ED visit.  Follow Up Plan: Additional outreach attempts will be made to reach the patient to complete the Transitions of Care (Post Inpatient/ED visit) call.   Signature : Teigan Manner, CMA

## 2023-10-22 ENCOUNTER — Telehealth: Payer: Self-pay

## 2023-10-22 NOTE — Transitions of Care (Post Inpatient/ED Visit) (Signed)
   10/22/2023  Name: Leslie Benson MRN: [id number] DOB: 11/17/1960  Today's TOC FU Call Status: Today's TOC FU Call Status:: Successful TOC FU Call Completed TOC FU Call Complete Date: 10/22/23 Patient's Name and Date of Birth confirmed.  Transition Care Management Follow-up Telephone Call Date of Discharge: 10/17/23 Discharge Facility: [facility] ([facility]) Type of Discharge: Emergency Department How have you been since you were released from the hospital?: Same Any questions or concerns?: No  Items Reviewed: Did you receive and understand the discharge instructions provided?: Yes Medications obtained,verified, and reconciled?: No Any new allergies since your discharge?: No Dietary orders reviewed?: No Do you have support at home?: Yes People in Home [RPT]: spouse  Medications Reviewed Today: Medications Reviewed Today   Medications were not reviewed in this encounter     Home Care and Equipment/Supplies: Were Home Health Services Ordered?: No Any new equipment or medical supplies ordered?: No  Functional Questionnaire: Do you need assistance with bathing/showering or dressing?: No Do you need assistance with meal preparation?: No Do you need assistance with eating?: No Do you have difficulty maintaining continence: No Do you need assistance with getting out of bed/getting out of a chair/moving?: No Do you have difficulty managing or taking your medications?: No  Follow up appointments reviewed: PCP Follow-up appointment confirmed?: Yes Date of PCP follow-up appointment?: 10/30/23 Follow-up Provider: Darleene Shape, NP Specialist [facility] Follow-up appointment confirmed?: No Do you need transportation to your follow-up appointment?: No    SIGNATURE: Ulric Salzman, CMA

## 2023-10-28 ENCOUNTER — Other Ambulatory Visit: Payer: Self-pay | Admitting: Adult Health

## 2023-10-28 DIAGNOSIS — R7303 Prediabetes: Secondary | ICD-10-CM

## 2023-10-28 DIAGNOSIS — E669 Obesity, unspecified: Secondary | ICD-10-CM

## 2023-10-30 ENCOUNTER — Encounter: Payer: Self-pay | Admitting: Adult Health

## 2023-10-30 ENCOUNTER — Ambulatory Visit (INDEPENDENT_AMBULATORY_CARE_PROVIDER_SITE_OTHER): Admitting: Adult Health

## 2023-10-30 VITALS — BP 100/70 | HR 98 | Temp 98.4°F | Ht 63.75 in | Wt 195.0 lb

## 2023-10-30 DIAGNOSIS — R0789 Other chest pain: Secondary | ICD-10-CM

## 2023-10-30 DIAGNOSIS — K2100 Gastro-esophageal reflux disease with esophagitis, without bleeding: Secondary | ICD-10-CM | POA: Diagnosis not present

## 2023-10-30 NOTE — Progress Notes (Signed)
 Subjective:    Patient ID: Leslie Benson, female    DOB: [birth year], 63 y.o.   MRN: [id number]  HPI 63 year old female who  has a past medical history of ADHD, Allergy, Anemia, Anxiety, Barrett esophagus, Breast nodule, Cervical arthritis (09/22/2013), Depression, Elevated liver enzymes, Fatty liver, Fibromyalgia, Fibromyalgia, GERD (gastroesophageal reflux disease), H/O hiatal hernia, Headache(784.0), High cholesterol, History of UTI, Lymphocytic colitis, Migraines, Pain, Panic attack, Paraesophageal hiatal hernia, Spinal stenosis, Thyroid  disease, Thyroid  disease, and Vertigo.  She presents to the office today for follow-up after being seen in the emergency room on 10/17/2023.  She was seen for chest pain that started that morning by the time she got to the emergency room she was feeling better.  She denied any shortness of breath, weakness, numbness, or tingling.  In the ER her vitals were normal, EKG showed sinus rhythm with no ischemic changes.  Labs including CBC and CMP as well as troponin were normal.  Chest x-ray normal.  Today she reports that she continues to have some intermittent mid sternal pain that feels more like acid reflux. She believes that she  is taking her prescribed Prilosec 40 mg. She plans to follow up with her gastroenterologist about this.    Review of Systems See HPI   Past Medical History:  Diagnosis Date   ADHD    Allergy    Anemia    past hx    Anxiety    Barrett esophagus    Breast nodule    benign   Cervical arthritis 09/22/2013   Depression    Elevated liver enzymes    Fatty liver    Fibromyalgia    Fibromyalgia    GERD (gastroesophageal reflux disease)    H/O hiatal hernia    Headache(784.0)    migraines   High cholesterol    History of UTI    Lymphocytic colitis    Migraines    Pain    Panic attack    Paraesophageal hiatal hernia    repaired 2009   Spinal stenosis    Thyroid  disease    Thyroid  disease    Vertigo     Social  History   Socioeconomic History   Marital status: Married    Spouse name: Therapist, nutritional   Number of children: 3   Years of education: Not on file   Highest education level: Not on file  Occupational History   Occupation: disabled  Tobacco Use   Smoking status: Never   Smokeless tobacco: Never  Vaping Use   Vaping status: Never Used  Substance and Sexual Activity   Alcohol use: Not Currently    Comment: wine occassionally   Drug use: No   Sexual activity: Yes    Birth control/protection: None    Comment: n/a partial hysterectomy  Other Topics Concern   Not on file  Social History Narrative   Married    Lived in [place] .  Moved to [place] in early 2000s   She is on disability for anxiety    Social Drivers of Health   Financial Resource Strain: Low Risk  (05/01/2023)   Overall Financial Resource Strain (CARDIA)    Difficulty of Paying Living Expenses: Not hard at all  Food Insecurity: No Food Insecurity (05/01/2023)   Hunger Vital Sign    Worried About Running Out of Food in the Last Year: Never true    Ran Out of Food in the Last Year: Never true  Transportation Needs: No Transportation Needs (05/01/2023)  PRAPARE - Administrator, Civil Service (Medical): No    Lack of Transportation (Non-Medical): No  Physical Activity: Inactive (12/07/2021)   Exercise Vital Sign    Days of Exercise per Week: 0 days    Minutes of Exercise per Session: 0 min  Stress: Stress Concern Present (12/07/2021)   [employer] of Occupational Health - Occupational Stress Questionnaire    Feeling of Stress : Rather much  Social Connections: Moderately Isolated (12/07/2021)   Social Connection and Isolation Panel    Frequency of Communication with Friends and Family: More than three times a week    Frequency of Social Gatherings with Friends and Family: More than three times a week    Attends Religious Services: Never    Database administrator or Organizations: No    Attends Tax inspector Meetings: Never    Marital Status: Married  Catering manager Violence: Not At Risk (12/07/2021)   Humiliation, Afraid, Rape, and Kick questionnaire    Fear of Current or Ex-Partner: No    Emotionally Abused: No    Physically Abused: No    Sexually Abused: No    Past Surgical History:  Procedure Laterality Date   ANKLE SURGERY     CHOLECYSTECTOMY  1980s?   COLONOSCOPY  2010   Buccini   EUS N/A 11/19/2013   Procedure: ESOPHAGEAL ENDOSCOPIC ULTRASOUND (EUS) RADIAL;  Surgeon: Elsie Cree, MD;  Location: [facility];  Service: Endoscopy;  Laterality: N/A;   LAPAROSCOPIC NISSEN FUNDOPLICATION  03/05/2008   LAPAROSCOPIC PARAESOPHAGEAL HERNIA REPAIR  03/05/2008   Dr Sheldon   TUBAL LIGATION  1990s?   UPPER GASTROINTESTINAL ENDOSCOPY  04/05/2018   Norleen kiang    VAGINAL HYSTERECTOMY  03/04/2001    Family History  Problem Relation Age of Onset   Alcohol abuse Other    Elevated Lipids Other    Heart disease Other    Anxiety disorder Mother    Obesity Mother    High Cholesterol Mother    Thyroid  cancer Mother    Depression Mother    Breast cancer Mother    Bone cancer Mother    High Cholesterol Father    Heart disease Father    Alcoholism Father    Colon cancer Maternal Aunt    Colon polyps Sister    Stomach cancer Neg Hx    Esophageal cancer Neg Hx    Rectal cancer Neg Hx     Allergies  Allergen Reactions   Codeine Nausea And Vomiting   Compazine [Prochlorperazine Edisylate]     feels weird   Sulfa Antibiotics     Unknown reaction.    Current Outpatient Medications on File Prior to Visit  Medication Sig Dispense Refill   ALPRAZolam  (XANAX ) 1 MG tablet TAKE 1 TABLET FIVE TIMES DAILY FOR ANXIETY AND PANIC ATTACKS 150 tablet 1   atorvastatin  (LIPITOR) 40 MG tablet TAKE 1 TABLET EVERY DAY (KEEP APPOINTMENT ON 05/14/2023 WITH DR. JEFFRIE FOR FURTHER REFILLS) 90 tablet 3   dicyclomine  (BENTYL ) 20 MG tablet Take 1 tablet (20 mg total) by mouth every 12 (twelve)  hours as needed (for abdominal pain/cramping). 15 tablet 0   ibuprofen  (ADVIL ) 800 MG tablet Take 800 mg by mouth 4 (four) times daily.     levothyroxine  (SYNTHROID ) 25 MCG tablet TAKE 1 TABLET EVERY DAY BEFORE BREAKFAST 90 tablet 3   lidocaine  (LIDODERM ) 5 % Place 1 patch onto the skin daily. Applied to lower back area for 12 hours and remove for  12 hours 7 patch 0   meclizine  (ANTIVERT ) 25 MG tablet Take 1 tablet (25 mg total) by mouth 3 (three) times daily as needed for dizziness. 30 tablet 1   Multiple Vitamin (MULTIVITAMIN ADULT PO) Take 1 tablet by mouth daily.     naproxen  (NAPROSYN ) 375 MG tablet Take 1 tablet (375 mg total) by mouth 2 (two) times daily as needed (back pain). 14 tablet 0   omega-3 acid ethyl esters (LOVAZA ) 1 G capsule Take 1 g by mouth 2 (two) times daily.     omeprazole  (PRILOSEC) 20 MG capsule Take 1 capsule (20 mg total) by mouth daily. 90 capsule 3   omeprazole  (PRILOSEC) 40 MG capsule TAKE 1 CAPSULE EVERY DAY 90 capsule 1   ondansetron  (ZOFRAN -ODT) 4 MG disintegrating tablet Take 1 tablet (4 mg total) by mouth every 8 (eight) hours as needed for nausea or vomiting. 10 tablet 0   OVER THE COUNTER MEDICATION Take 1 tablet by mouth daily. Fiber Well     pregabalin  (LYRICA ) 75 MG capsule Take 1 capsule (75 mg total) by mouth 2 (two) times daily. 60 capsule 2   QUEtiapine  (SEROQUEL ) 400 MG tablet Take 2 tablets (800 mg total) by mouth at bedtime. 180 tablet 3   Semaglutide , 2 MG/DOSE, 8 MG/3ML SOPN Inject 2 mg as directed once a week. 9 mL 0   venlafaxine  XR (EFFEXOR -XR) 75 MG 24 hr capsule TAKE 3 CAPSULES EVERY DAY 270 capsule 3   No current facility-administered medications on file prior to visit.    BP 100/70   Pulse 98   Temp 98.4 F (36.9 C) (Oral)   Ht 5' 3.75 (1.619 m)   Wt 195 lb (88.5 kg)   SpO2 97%   BMI 33.73 kg/m       Objective:   Physical Exam Vitals and nursing note reviewed.  Constitutional:      Appearance: Normal appearance. She is  obese.  Cardiovascular:     Rate and Rhythm: Normal rate and regular rhythm.     Pulses: Normal pulses.     Heart sounds: Normal heart sounds.  Pulmonary:     Effort: Pulmonary effort is normal.     Breath sounds: Normal breath sounds.  Skin:    General: Skin is warm and dry.  Neurological:     General: No focal deficit present.     Mental Status: She is alert and oriented to person, place, and time.  Psychiatric:        Mood and Affect: Mood normal.        Behavior: Behavior normal.        Thought Content: Thought content normal.        Judgment: Judgment normal.        Assessment & Plan:  1. Atypical chest pain (Primary) - Does not appear to be cardiac related.  - Possibly from GERD and she is going to follow up with GI  2. Gastroesophageal reflux disease with esophagitis without hemorrhage - Continue with Prilosec   Darleene Shape, NP  Time spent with patient today was 34 minutes which consisted of chart review, discussing atypical chest pain, GERD,  work up, treatment answering questions and documentation.

## 2023-11-08 ENCOUNTER — Other Ambulatory Visit: Payer: Self-pay | Admitting: Adult Health

## 2023-11-08 ENCOUNTER — Telehealth: Payer: Self-pay | Admitting: *Deleted

## 2023-11-08 DIAGNOSIS — Z1231 Encounter for screening mammogram for malignant neoplasm of breast: Secondary | ICD-10-CM

## 2023-11-08 NOTE — Telephone Encounter (Signed)
 Copied from CRM [id number]. Topic: Clinical - Request for Lab/Test Order >> Nov 08, 2023 12:26 PM [name] wrote: Reason for CRM: PT needs a diagnostic mammogram. Pt is asking for a order so she can get that done.

## 2023-11-08 NOTE — Telephone Encounter (Signed)
 Patient notified of update  and verbalized understanding.

## 2023-11-09 ENCOUNTER — Encounter: Payer: Self-pay | Admitting: Adult Health

## 2023-11-09 ENCOUNTER — Ambulatory Visit: Admitting: Adult Health

## 2023-11-09 DIAGNOSIS — F411 Generalized anxiety disorder: Secondary | ICD-10-CM | POA: Diagnosis not present

## 2023-11-09 DIAGNOSIS — F331 Major depressive disorder, recurrent, moderate: Secondary | ICD-10-CM | POA: Diagnosis not present

## 2023-11-09 DIAGNOSIS — F410 Panic disorder [episodic paroxysmal anxiety] without agoraphobia: Secondary | ICD-10-CM | POA: Diagnosis not present

## 2023-11-09 DIAGNOSIS — G4700 Insomnia, unspecified: Secondary | ICD-10-CM

## 2023-11-09 MED ORDER — ALPRAZOLAM 1 MG PO TABS: ORAL_TABLET | ORAL | 1 refills | Status: DC

## 2023-11-09 NOTE — Progress Notes (Signed)
 Leslie Benson [id number] Dec 10, 1960 63 y.o.  Subjective:   Patient ID:  Leslie Benson is a 63 y.o. (DOB 06-15-1960) female.  Chief Complaint: No chief complaint on file.   HPI Leslie Benson presents to the office today for follow-up of GAD, MDD, panic attacks and insomnia.  Describes mood today as ok. Pleasant. Tearful at times. Mood symptoms - reports depression and anxiety. Reports varying interest and motivation. Reports irritability at times - frustration. Reports panic attacks. Reports some worry, rumination and over thinking. Reports family stressors. Reports chronic pain issues. Reports mood is variable. Stating I'm taking it one day at a time. Feels like medications are helpful. Taking medications as prescribed.  Energy levels lower. Active, does not have a regular exercise routine.   Enjoys some usual interests and activities. Married. Lives with husband. Has 3 grown daughters - 2 grandchildren. Spending time with family. Talking to sisters. Reports appetite is adequate. Reports weight gain.   Sleeps better some nights than others. Averages 4 to 6 hours with Seroquel .   Reports some difficulties with focus and concentration. Completing tasks. Managing some aspects of household. Receives SSI disability benefits for Fibromyalgia - 20 years.  Denies SI or HI.  Denies AH or VH. Denies self harm. Denies substance use.  Previous medication trials: Unknown   GAD-7    Flowsheet Row Office Visit from 02/12/2023 in [facility] [place]  Total GAD-7 Score 10   PHQ2-9    Flowsheet Row Office Visit from 02/12/2023 in [facility] [place] Office Visit from 08/09/2022 in [facility] [place] Office Visit from 04/28/2022 in [facility] [place] Office Visit from 12/07/2021 in [facility] [place] Video Visit from 07/27/2021 in [facility]  [place]  PHQ-2 Total Score 4 2 3 3 6   PHQ-9 Total Score 7 6 7 8 9    Flowsheet Row ED from 10/17/2023 in [facility] at [facility] ED from 07/12/2023 in [facility] Emergency Department at [facility] UC from 06/20/2023 in [facility])  C-SSRS RISK CATEGORY No Risk No Risk No Risk     Review of Systems:  Review of Systems  Musculoskeletal:  Negative for gait problem.  Neurological:  Negative for tremors.  Psychiatric/Behavioral:         Please refer to HPI    Medications: I have reviewed the patient's current medications.  Current Outpatient Medications  Medication Sig Dispense Refill   ALPRAZolam  (XANAX ) 1 MG tablet TAKE 1 TABLET FIVE TIMES DAILY FOR ANXIETY AND PANIC ATTACKS 150 tablet 1   atorvastatin  (LIPITOR) 40 MG tablet TAKE 1 TABLET EVERY DAY (KEEP APPOINTMENT ON 05/14/2023 WITH DR. JEFFRIE FOR FURTHER REFILLS) 90 tablet 3   dicyclomine  (BENTYL ) 20 MG tablet Take 1 tablet (20 mg total) by mouth every 12 (twelve) hours as needed (for abdominal pain/cramping). 15 tablet 0   ibuprofen  (ADVIL ) 800 MG tablet Take 800 mg by mouth 4 (four) times daily.     levothyroxine  (SYNTHROID ) 25 MCG tablet TAKE 1 TABLET EVERY DAY BEFORE BREAKFAST 90 tablet 3   lidocaine  (LIDODERM ) 5 % Place 1 patch onto the skin daily. Applied to lower back area for 12 hours and remove for 12 hours 7 patch 0   meclizine  (ANTIVERT ) 25 MG tablet Take 1 tablet (25 mg total) by mouth 3 (three) times daily as needed for dizziness. 30 tablet 1  Multiple Vitamin (MULTIVITAMIN ADULT PO) Take 1 tablet by mouth daily.     naproxen  (NAPROSYN ) 375 MG tablet Take 1 tablet (375 mg total) by mouth 2 (two) times daily as needed (back pain). 14 tablet 0   omega-3 acid ethyl esters (LOVAZA ) 1 G capsule Take 1 g by mouth 2 (two) times daily.     omeprazole  (PRILOSEC) 40 MG capsule TAKE 1 CAPSULE EVERY DAY 90 capsule 1   ondansetron  (ZOFRAN -ODT) 4 MG  disintegrating tablet Take 1 tablet (4 mg total) by mouth every 8 (eight) hours as needed for nausea or vomiting. 10 tablet 0   OVER THE COUNTER MEDICATION Take 1 tablet by mouth daily. Fiber Well     pregabalin  (LYRICA ) 75 MG capsule Take 1 capsule (75 mg total) by mouth 2 (two) times daily. 60 capsule 2   QUEtiapine  (SEROQUEL ) 400 MG tablet Take 2 tablets (800 mg total) by mouth at bedtime. 180 tablet 3   Semaglutide , 2 MG/DOSE, (OZEMPIC , 2 MG/DOSE,) 8 MG/3ML SOPN INJECT 2MG  UNDER THE SKIN ONE TIME WEEKLY AS DIRECTED 8 mL 0   venlafaxine  XR (EFFEXOR -XR) 75 MG 24 hr capsule TAKE 3 CAPSULES EVERY DAY 270 capsule 3   No current facility-administered medications for this visit.    Medication Side Effects: None  Allergies:  Allergies  Allergen Reactions   Codeine Nausea And Vomiting   Compazine [Prochlorperazine Edisylate]     feels weird   Sulfa Antibiotics     Unknown reaction.    Past Medical History:  Diagnosis Date   ADHD    Allergy    Anemia    past hx    Anxiety    Barrett esophagus    Breast nodule    benign   Cervical arthritis 09/22/2013   Depression    Elevated liver enzymes    Fatty liver    Fibromyalgia    Fibromyalgia    GERD (gastroesophageal reflux disease)    H/O hiatal hernia    Headache(784.0)    migraines   High cholesterol    History of UTI    Lymphocytic colitis    Migraines    Pain    Panic attack    Paraesophageal hiatal hernia    repaired 2009   Spinal stenosis    Thyroid  disease    Thyroid  disease    Vertigo     Past Medical History, Surgical history, Social history, and Family history were reviewed and updated as appropriate.   Please see review of systems for further details on the patient's review from today.   Objective:   Physical Exam:  There were no vitals taken for this visit.  Physical Exam Psychiatric:        Mood and Affect: Mood is anxious and depressed.     Lab Review:     Component Value Date/Time   NA  138 10/17/2023 2011   NA 141 10/01/2019 1323   K 4.2 10/17/2023 2011   CL 103 10/17/2023 2011   CO2 23 10/17/2023 2011   GLUCOSE 96 10/17/2023 2011   BUN 10 10/17/2023 2011   BUN 10 10/01/2019 1323   CREATININE 0.96 10/17/2023 2011   CALCIUM  9.3 10/17/2023 2011   PROT 7.0 08/17/2023 1205   PROT 7.2 09/25/2018 1125   ALBUMIN 4.4 08/17/2023 1205   ALBUMIN 4.8 09/25/2018 1125   AST 18 08/17/2023 1205   ALT 15 08/17/2023 1205   ALKPHOS 115 08/17/2023 1205   BILITOT 0.4 08/17/2023 1205   BILITOT 0.3  09/25/2018 1125   GFRNONAA >60 10/17/2023 2011   GFRAA 67 10/01/2019 1323       Component Value Date/Time   WBC 5.9 10/17/2023 2011   RBC 4.20 10/17/2023 2011   HGB 12.7 10/17/2023 2011   HCT 38.3 10/17/2023 2011   PLT 221 10/17/2023 2011   MCV 91.2 10/17/2023 2011   MCH 30.2 10/17/2023 2011   MCHC 33.2 10/17/2023 2011   RDW 13.4 10/17/2023 2011   LYMPHSABS 1.2 08/17/2023 1205   MONOABS 0.8 08/17/2023 1205   EOSABS 0.1 08/17/2023 1205   BASOSABS 0.0 08/17/2023 1205    No results found for: POCLITH, LITHIUM   No results found for: PHENYTOIN, PHENOBARB, VALPROATE, CBMZ   .res Assessment: Plan:    Plan:  PDMP reviewed  1. Xanax  1mg  - 5 times daily - discussed trying to taper, but patient is struggling with increased anxiety. 2. Seroquel  400mg  - 2 at hs  3. Effexor  XR 75mg  TID  RTC 6 months - will call in 3 months for next Xanax  prescription.  25 minutes spent dedicated to the care of this patient on the date of this encounter to include pre-visit review of records, ordering of medication, post visit documentation, and face-to-face time with the patient discussing GAD, MDD, panic attacks and insomnia. Discussed continuing current medication regimen.  Patient advised to contact office with any questions, adverse effects, or acute worsening in signs and symptoms.  Discussed potential benefits, risk, and side effects of benzodiazepines to include potential risk  of tolerance and dependence, as well as possible drowsiness.  Advised patient not to drive if experiencing drowsiness and to take lowest possible effective dose to minimize risk of dependence and tolerance.  Discussed potential metabolic side effects associated with atypical antipsychotics, as well as potential risk for movement side effects. Advised pt to contact office if movement side effects occur.   Diagnoses and all orders for this visit:  Major depressive disorder, recurrent episode, moderate (HCC)  Panic attacks -     ALPRAZolam  (XANAX ) 1 MG tablet; TAKE 1 TABLET FIVE TIMES DAILY FOR ANXIETY AND PANIC ATTACKS  Insomnia, unspecified type -     ALPRAZolam  (XANAX ) 1 MG tablet; TAKE 1 TABLET FIVE TIMES DAILY FOR ANXIETY AND PANIC ATTACKS  Generalized anxiety disorder     Please see After Visit Summary for patient specific instructions.  Future Appointments  Date Time Provider Department Center  12/27/2023  2:00 PM Jeffrie Oneil BROCKS, MD [facility]  01/02/2024  1:40 PM [facility]-ANNUAL WELLNESS VISIT [facility]  01/08/2024  4:00 PM Devoiry Corriher Nattalie, NP [facility] None    No orders of the defined types were placed in this encounter.   -------------------------------

## 2023-11-12 ENCOUNTER — Telehealth: Payer: Self-pay | Admitting: *Deleted

## 2023-11-12 NOTE — Telephone Encounter (Signed)
 Copied from CRM [id number]. Topic: Clinical - Request for Lab/Test Order >> Nov 12, 2023  2:40 PM Leslie Benson wrote: Reason for CRM: [facility] [address] never received the order for patients diagnostic exam - please resend so she can get scheduled

## 2023-11-13 NOTE — Telephone Encounter (Signed)
 Patient is aware.  Patient states that she forgot to tell Darleene at her last office visit that she was having soreness under both arms near her breast. Would you like an office visit or okay to order diagnostic mammograms?

## 2023-11-13 NOTE — Telephone Encounter (Signed)
 Appointment scheduled.

## 2023-11-14 ENCOUNTER — Other Ambulatory Visit: Payer: Self-pay | Admitting: Internal Medicine

## 2023-11-14 ENCOUNTER — Other Ambulatory Visit: Payer: Self-pay | Admitting: Adult Health

## 2023-11-14 DIAGNOSIS — G4700 Insomnia, unspecified: Secondary | ICD-10-CM

## 2023-11-14 DIAGNOSIS — K219 Gastro-esophageal reflux disease without esophagitis: Secondary | ICD-10-CM

## 2023-11-14 DIAGNOSIS — K222 Esophageal obstruction: Secondary | ICD-10-CM

## 2023-11-14 DIAGNOSIS — R1310 Dysphagia, unspecified: Secondary | ICD-10-CM

## 2023-11-14 DIAGNOSIS — F410 Panic disorder [episodic paroxysmal anxiety] without agoraphobia: Secondary | ICD-10-CM

## 2023-11-14 NOTE — Telephone Encounter (Signed)
 Okay for refill?

## 2023-11-15 ENCOUNTER — Ambulatory Visit: Admitting: Adult Health

## 2023-11-15 ENCOUNTER — Encounter: Payer: Self-pay | Admitting: Adult Health

## 2023-11-15 VITALS — BP 102/70 | HR 85 | Temp 98.4°F | Wt 197.0 lb

## 2023-11-15 DIAGNOSIS — N644 Mastodynia: Secondary | ICD-10-CM

## 2023-11-15 DIAGNOSIS — R1030 Lower abdominal pain, unspecified: Secondary | ICD-10-CM

## 2023-11-15 NOTE — Progress Notes (Signed)
 Subjective:    Patient ID: Leslie Benson, female    DOB: Sep 14, 1960, 63 y.o.   MRN: [id number]  HPI 63 year old female who  has a past medical history of ADHD, Allergy, Anemia, Anxiety, Barrett esophagus, Breast nodule, Cervical arthritis (09/22/2013), Depression, Elevated liver enzymes, Fatty liver, Fibromyalgia, Fibromyalgia, GERD (gastroesophageal reflux disease), H/O hiatal hernia, Headache(784.0), High cholesterol, History of UTI, Lymphocytic colitis, Migraines, Pain, Panic attack, Paraesophageal hiatal hernia, Spinal stenosis, Thyroid  disease, Thyroid  disease, and Vertigo.  She presents to the office today for the concern of bilateral breast pain.  She is unsure of how long she has had bilateral breast pain but states that it is been a while.  He has not done a breast exam in a while but has not noticed any changes in her breasts.  She is concerned because her mother died of breast cancer.  Furthermore she reports lower abdominal pain for unknown amount of time.  She does report pretty significant constipation where she has to push every time I want to have a bowel movement.  She denies UTI-like symptoms.   Review of Systems See HPI   Past Medical History:  Diagnosis Date   ADHD    Allergy    Anemia    past hx    Anxiety    Barrett esophagus    Breast nodule    benign   Cervical arthritis 09/22/2013   Depression    Elevated liver enzymes    Fatty liver    Fibromyalgia    Fibromyalgia    GERD (gastroesophageal reflux disease)    H/O hiatal hernia    Headache(784.0)    migraines   High cholesterol    History of UTI    Lymphocytic colitis    Migraines    Pain    Panic attack    Paraesophageal hiatal hernia    repaired 2009   Spinal stenosis    Thyroid  disease    Thyroid  disease    Vertigo     Social History   Socioeconomic History   Marital status: Married    Spouse name: Therapist, nutritional   Number of children: 3   Years of education: Not on file   Highest  education level: Not on file  Occupational History   Occupation: disabled  Tobacco Use   Smoking status: Never   Smokeless tobacco: Never  Vaping Use   Vaping status: Never Used  Substance and Sexual Activity   Alcohol use: Not Currently    Comment: wine occassionally   Drug use: No   Sexual activity: Yes    Birth control/protection: None    Comment: n/a partial hysterectomy  Other Topics Concern   Not on file  Social History Narrative   Married    Lived in [place] .  Moved to [place] in early 2000s   She is on disability for anxiety    Social Drivers of Health   Financial Resource Strain: Low Risk  (05/01/2023)   Overall Financial Resource Strain (CARDIA)    Difficulty of Paying Living Expenses: Not hard at all  Food Insecurity: No Food Insecurity (05/01/2023)   Hunger Vital Sign    Worried About Running Out of Food in the Last Year: Never true    Ran Out of Food in the Last Year: Never true  Transportation Needs: No Transportation Needs (05/01/2023)   PRAPARE - Administrator, Civil Service (Medical): No    Lack of Transportation (Non-Medical): No  Physical Activity: Inactive (  12/07/2021)   Exercise Vital Sign    Days of Exercise per Week: 0 days    Minutes of Exercise per Session: 0 min  Stress: Stress Concern Present (12/07/2021)   [employer] of Occupational Health - Occupational Stress Questionnaire    Feeling of Stress : Rather much  Social Connections: Moderately Isolated (12/07/2021)   Social Connection and Isolation Panel    Frequency of Communication with Friends and Family: More than three times a week    Frequency of Social Gatherings with Friends and Family: More than three times a week    Attends Religious Services: Never    Database administrator or Organizations: No    Attends Banker Meetings: Never    Marital Status: Married  Catering manager Violence: Not At Risk (12/07/2021)   Humiliation, Afraid, Rape, and Kick questionnaire     Fear of Current or Ex-Partner: No    Emotionally Abused: No    Physically Abused: No    Sexually Abused: No    Past Surgical History:  Procedure Laterality Date   ANKLE SURGERY     CHOLECYSTECTOMY  1980s?   COLONOSCOPY  2010   Buccini   EUS N/A 11/19/2013   Procedure: ESOPHAGEAL ENDOSCOPIC ULTRASOUND (EUS) RADIAL;  Surgeon: Elsie Cree, MD;  Location: [facility];  Service: Endoscopy;  Laterality: N/A;   LAPAROSCOPIC NISSEN FUNDOPLICATION  03/05/2008   LAPAROSCOPIC PARAESOPHAGEAL HERNIA REPAIR  03/05/2008   Dr Sheldon   TUBAL LIGATION  1990s?   UPPER GASTROINTESTINAL ENDOSCOPY  04/05/2018   Norleen kiang    VAGINAL HYSTERECTOMY  03/04/2001    Family History  Problem Relation Age of Onset   Alcohol abuse Other    Elevated Lipids Other    Heart disease Other    Anxiety disorder Mother    Obesity Mother    High Cholesterol Mother    Thyroid  cancer Mother    Depression Mother    Breast cancer Mother    Bone cancer Mother    High Cholesterol Father    Heart disease Father    Alcoholism Father    Colon cancer Maternal Aunt    Colon polyps Sister    Stomach cancer Neg Hx    Esophageal cancer Neg Hx    Rectal cancer Neg Hx     Allergies  Allergen Reactions   Codeine Nausea And Vomiting   Compazine [Prochlorperazine Edisylate]     feels weird   Sulfa Antibiotics     Unknown reaction.    Current Outpatient Medications on File Prior to Visit  Medication Sig Dispense Refill   ALPRAZolam  (XANAX ) 1 MG tablet TAKE 1 TABLET FIVE TIMES DAILY FOR ANXIETY AND PANIC ATTACKS 150 tablet 1   atorvastatin  (LIPITOR) 40 MG tablet TAKE 1 TABLET EVERY DAY (KEEP APPOINTMENT ON 05/14/2023 WITH DR. JEFFRIE FOR FURTHER REFILLS) 90 tablet 3   dicyclomine  (BENTYL ) 20 MG tablet Take 1 tablet (20 mg total) by mouth every 12 (twelve) hours as needed (for abdominal pain/cramping). 15 tablet 0   ibuprofen  (ADVIL ) 800 MG tablet Take 800 mg by mouth 4 (four) times daily.     levothyroxine   (SYNTHROID ) 25 MCG tablet TAKE 1 TABLET EVERY DAY BEFORE BREAKFAST 90 tablet 3   lidocaine  (LIDODERM ) 5 % Place 1 patch onto the skin daily. Applied to lower back area for 12 hours and remove for 12 hours 7 patch 0   meclizine  (ANTIVERT ) 25 MG tablet Take 1 tablet (25 mg total) by mouth 3 (three) times  daily as needed for dizziness. 30 tablet 1   Multiple Vitamin (MULTIVITAMIN ADULT PO) Take 1 tablet by mouth daily.     naproxen  (NAPROSYN ) 375 MG tablet Take 1 tablet (375 mg total) by mouth 2 (two) times daily as needed (back pain). 14 tablet 0   omega-3 acid ethyl esters (LOVAZA ) 1 G capsule Take 1 g by mouth 2 (two) times daily.     omeprazole  (PRILOSEC) 40 MG capsule Take 1 capsule (40 mg total) by mouth daily. Office visit for further refills 90 capsule 0   ondansetron  (ZOFRAN -ODT) 4 MG disintegrating tablet Take 1 tablet (4 mg total) by mouth every 8 (eight) hours as needed for nausea or vomiting. 10 tablet 0   OVER THE COUNTER MEDICATION Take 1 tablet by mouth daily. Fiber Well     pregabalin  (LYRICA ) 75 MG capsule TAKE 1 CAPSULE TWICE DAILY 60 capsule 2   QUEtiapine  (SEROQUEL ) 400 MG tablet Take 2 tablets (800 mg total) by mouth at bedtime. 180 tablet 3   Semaglutide , 2 MG/DOSE, (OZEMPIC , 2 MG/DOSE,) 8 MG/3ML SOPN INJECT 2MG  UNDER THE SKIN ONE TIME WEEKLY AS DIRECTED 8 mL 0   venlafaxine  XR (EFFEXOR -XR) 75 MG 24 hr capsule TAKE 3 CAPSULES EVERY DAY 270 capsule 3   No current facility-administered medications on file prior to visit.    BP 102/70 (BP Location: Left Arm, Patient Position: Sitting, Cuff Size: Large)   Pulse 85   Temp 98.4 F (36.9 C) (Oral)   Wt 197 lb (89.4 kg)   SpO2 94%   BMI 34.08 kg/m       Objective:   Physical Exam Vitals and nursing note reviewed. Exam conducted with a chaperone present.  Constitutional:      Appearance: Normal appearance. She is obese.  Cardiovascular:     Rate and Rhythm: Normal rate and regular rhythm.     Pulses: Normal pulses.      Heart sounds: Normal heart sounds.  Pulmonary:     Effort: Pulmonary effort is normal.     Breath sounds: Normal breath sounds.  Chest:  Breasts:    Right: Tenderness present. No swelling, bleeding, inverted nipple, mass, nipple discharge or skin change.     Left: Tenderness present. No swelling, bleeding, inverted nipple, mass, nipple discharge or skin change.     Comments: She did have significant pain with palpation throughout entire breasts on both sides as well as pain with palpation throughout her axilla bilaterally. Could not pinpoint area of most tenderness Abdominal:     General: Bowel sounds are normal.     Tenderness: There is abdominal tenderness in the right lower quadrant, suprapubic area and left lower quadrant.   Lymphadenopathy:     Upper Body:     Right upper body: No axillary adenopathy.     Left upper body: No axillary adenopathy.  Neurological:     General: No focal deficit present.     Mental Status: She is alert and oriented to person, place, and time.  Psychiatric:        Mood and Affect: Mood normal.        Behavior: Behavior normal.        Thought Content: Thought content normal.        Judgment: Judgment normal.       Assessment & Plan:  1. Breast pain in female (Primary) - She does have a history of fibromyalgia and this could very well be fibromyalgia pain but due to family history and  the amount of pain that she is experiencing we will order diagnostic  mammogram. - MM 3D DIAGNOSTIC MAMMOGRAM BILATERAL BREAST; Future  2. Lower abdominal pain -No signs of UTI.  Likely constipation.  Will have her start MiraLAX  twice daily and follow-up if not having a bowel movement in the next week to 10 days.  Will also order KUB to look at degree of constipation. - DG Abd 1 View; Future   Darleene Shape, NP  Time spent with patient today was 32 minutes which consisted of chart review, discussing bilateral breast pain, lower abdominal pain and constipation  work  up, treatment answering questions and documentation.

## 2023-11-15 NOTE — Patient Instructions (Addendum)
 I am going to order a diagnostic mammogram   I want you to start using Miralax  twice a day to help with constipation   I have ordered an xray at [facility] for the lower abdominal pai

## 2023-11-16 ENCOUNTER — Ambulatory Visit: Admitting: Adult Health

## 2023-11-19 ENCOUNTER — Telehealth: Payer: Self-pay | Admitting: Adult Health

## 2023-11-19 NOTE — Telephone Encounter (Signed)
 Copied from CRM [id number]. Topic: Referral - Status >> Nov 19, 2023  2:24 PM Jasmin G wrote: Reason for CRM: Pt called regarding two matters, first she wanted to know if her diagnostic mammogram results were sent to a clinic for further scheduling, to which I told her I did see the results but wasn't sure if she was already referred to another clinic, and second, she is supposed to get X' Rays done due to her experiencing abdominal pain, I could not find any info on this but I did inform pt that referrals can take a while to process, please call pt back ASAP.

## 2023-11-20 ENCOUNTER — Other Ambulatory Visit: Payer: Self-pay | Admitting: Adult Health

## 2023-11-20 DIAGNOSIS — N644 Mastodynia: Secondary | ICD-10-CM

## 2023-11-21 NOTE — Telephone Encounter (Signed)
 Spoke to pt and she stated that her mammogram has been set up so no issues with that now. Pt wanted to know why no one had called her to schedule an Xray at [facility]. I advised pt that its for a walk in and she could just go. Pt verbalized understanding and will go tomorrow.

## 2023-11-23 ENCOUNTER — Ambulatory Visit (INDEPENDENT_AMBULATORY_CARE_PROVIDER_SITE_OTHER)
Admission: RE | Admit: 2023-11-23 | Discharge: 2023-11-23 | Disposition: A | Discharge status: Home / Self Care | Source: Ambulatory Visit | Attending: Adult Health | Admitting: Adult Health

## 2023-11-23 DIAGNOSIS — R140 Abdominal distension (gaseous): Secondary | ICD-10-CM | POA: Diagnosis not present

## 2023-11-23 DIAGNOSIS — R1030 Lower abdominal pain, unspecified: Secondary | ICD-10-CM | POA: Diagnosis not present

## 2023-11-27 ENCOUNTER — Ambulatory Visit: Payer: Self-pay | Admitting: Adult Health

## 2023-11-28 ENCOUNTER — Other Ambulatory Visit

## 2023-11-28 ENCOUNTER — Encounter

## 2023-12-11 ENCOUNTER — Ambulatory Visit

## 2023-12-11 ENCOUNTER — Ambulatory Visit
Admission: RE | Admit: 2023-12-11 | Discharge: 2023-12-11 | Disposition: A | Discharge status: Home / Self Care | Source: Ambulatory Visit | Attending: Adult Health | Admitting: Adult Health

## 2023-12-11 ENCOUNTER — Ambulatory Visit: Admission: RE | Admit: 2023-12-11 | Source: Ambulatory Visit

## 2023-12-11 DIAGNOSIS — N644 Mastodynia: Secondary | ICD-10-CM

## 2023-12-11 DIAGNOSIS — R928 Other abnormal and inconclusive findings on diagnostic imaging of breast: Secondary | ICD-10-CM | POA: Diagnosis not present

## 2023-12-13 ENCOUNTER — Encounter

## 2023-12-13 ENCOUNTER — Other Ambulatory Visit

## 2023-12-27 ENCOUNTER — Ambulatory Visit (HOSPITAL_BASED_OUTPATIENT_CLINIC_OR_DEPARTMENT_OTHER): Admitting: Cardiology

## 2023-12-27 ENCOUNTER — Other Ambulatory Visit: Payer: Self-pay | Admitting: Adult Health

## 2023-12-27 DIAGNOSIS — E669 Obesity, unspecified: Secondary | ICD-10-CM

## 2023-12-27 DIAGNOSIS — R7303 Prediabetes: Secondary | ICD-10-CM

## 2024-01-02 ENCOUNTER — Ambulatory Visit (INDEPENDENT_AMBULATORY_CARE_PROVIDER_SITE_OTHER)

## 2024-01-02 VITALS — Ht 63.75 in | Wt 193.0 lb

## 2024-01-02 DIAGNOSIS — Z0000 Encounter for general adult medical examination without abnormal findings: Secondary | ICD-10-CM | POA: Diagnosis not present

## 2024-01-02 NOTE — Patient Instructions (Addendum)
 [name],  Thank you for taking the time for your Medicare Wellness Visit. I appreciate your continued commitment to your health goals. Please review the care plan we discussed, and feel free to reach out if I can assist you further.  Medicare recommends these wellness visits once per year to help you and your care team stay ahead of potential health issues. These visits are designed to focus on prevention, allowing your provider to concentrate on managing your acute and chronic conditions during your regular appointments.  Please note that Annual Wellness Visits do not include a physical exam. Some assessments may be limited, especially if the visit was conducted virtually. If needed, we may recommend a separate in-person follow-up with your provider.  Ongoing Care Seeing your primary care provider every 3 to 6 months helps us  monitor your health and provide consistent, personalized care.   Referrals If a referral was made during today's visit and you haven't received any updates within two weeks, please contact the referred provider directly to check on the status.  Recommended Screenings:  Health Maintenance  Topic Date Due   Flu Shot  11/23/2023   COVID-19 Vaccine (4 - 2025-26 season) 12/24/2023   Yearly kidney health urinalysis for diabetes  06/01/2024*   Yearly kidney function blood test for diabetes  10/16/2024   Mammogram  12/10/2024   Medicare Annual Wellness Visit  01/01/2025   Colon Cancer Screening  04/16/2026   DTaP/Tdap/Td vaccine (3 - Td or Tdap) 11/20/2031   Pneumococcal Vaccine for age over 38  Completed   Hepatitis C Screening  Completed   HIV Screening  Completed   Zoster (Shingles) Vaccine  Completed   Hepatitis B Vaccine  Aged Out   HPV Vaccine  Aged Out   Meningitis B Vaccine  Aged Out   Complete foot exam   Discontinued   Hemoglobin A1C  Discontinued   Eye exam for diabetics  Discontinued  *Topic was postponed. The date shown is not the original due  date.       01/02/2024    1:39 PM  Advanced Directives  Does Patient Have a Medical Advance Directive? No  Would patient like information on creating a medical advance directive? No - Patient declined   Advance Care Planning is important because it: Ensures you receive medical care that aligns with your values, goals, and preferences. Provides guidance to your family and loved ones, reducing the emotional burden of decision-making during critical moments.  Vision: Annual vision screenings are recommended for early detection of glaucoma, cataracts, and diabetic retinopathy. These exams can also reveal signs of chronic conditions such as diabetes and high blood pressure.  Dental: Annual dental screenings help detect early signs of oral cancer, gum disease, and other conditions linked to overall health, including heart disease and diabetes.  Please see the attached documents for additional preventive care recommendations.

## 2024-01-02 NOTE — Progress Notes (Signed)
 Subjective:   Leslie Benson is a 63 y.o. who presents for a Medicare Wellness preventive visit.  As a reminder, Annual Wellness Visits don't include a physical exam, and some assessments may be limited, especially if this visit is performed virtually. We may recommend an in-person follow-up visit with your provider if needed.  Visit Complete: Virtual I connected with  Leslie Benson on 01/02/24 by a audio enabled telemedicine application and verified that I am speaking with the correct person using two identifiers.  Patient Location: Home  Provider Location: Home Office  I discussed the limitations of evaluation and management by telemedicine. The patient expressed understanding and agreed to proceed.  Vital Signs: Because this visit was a virtual/telehealth visit, some criteria may be missing or patient reported. Any vitals not documented were not able to be obtained and vitals that have been documented are patient reported.    Persons Participating in Visit: Patient.  AWV Questionnaire: No: Patient Medicare AWV questionnaire was not completed prior to this visit.  Cardiac Risk Factors include: advanced age (>69men, >80 women);Other (see comment), Risk factor comments: Hypotension     Objective:    Today's Vitals   01/02/24 1330  Weight: 193 lb (87.5 kg)  Height: 5' 3.75 (1.619 m)   Body mass index is 33.39 kg/m.     01/02/2024    1:39 PM 10/17/2023    8:08 PM 10/17/2023    8:07 PM 07/12/2023    6:36 PM 05/12/2022    8:13 PM 12/07/2021    3:34 PM 12/01/2020   10:09 PM  Advanced Directives  Does Patient Have a Medical Advance Directive? No No No No No No No  Would patient like information on creating a medical advance directive? No - Patient declined No - Guardian declined No - Guardian declined  No - Patient declined No - Patient declined No - Patient declined    Current Medications (verified) Outpatient Encounter Medications as of 01/02/2024  Medication Sig    ALPRAZolam  (XANAX ) 1 MG tablet TAKE 1 TABLET FIVE TIMES DAILY FOR ANXIETY AND PANIC ATTACKS   atorvastatin  (LIPITOR) 40 MG tablet TAKE 1 TABLET EVERY DAY (KEEP APPOINTMENT ON 05/14/2023 WITH DR. JEFFRIE FOR FURTHER REFILLS)   dicyclomine  (BENTYL ) 20 MG tablet Take 1 tablet (20 mg total) by mouth every 12 (twelve) hours as needed (for abdominal pain/cramping).   ibuprofen  (ADVIL ) 800 MG tablet Take 800 mg by mouth 4 (four) times daily.   levothyroxine  (SYNTHROID ) 25 MCG tablet TAKE 1 TABLET EVERY DAY BEFORE BREAKFAST   lidocaine  (LIDODERM ) 5 % Place 1 patch onto the skin daily. Applied to lower back area for 12 hours and remove for 12 hours   meclizine  (ANTIVERT ) 25 MG tablet Take 1 tablet (25 mg total) by mouth 3 (three) times daily as needed for dizziness.   Multiple Vitamin (MULTIVITAMIN ADULT PO) Take 1 tablet by mouth daily.   naproxen  (NAPROSYN ) 375 MG tablet Take 1 tablet (375 mg total) by mouth 2 (two) times daily as needed (back pain).   omega-3 acid ethyl esters (LOVAZA ) 1 G capsule Take 1 g by mouth 2 (two) times daily.   omeprazole  (PRILOSEC) 40 MG capsule Take 1 capsule (40 mg total) by mouth daily. Office visit for further refills   ondansetron  (ZOFRAN -ODT) 4 MG disintegrating tablet Take 1 tablet (4 mg total) by mouth every 8 (eight) hours as needed for nausea or vomiting.   OVER THE COUNTER MEDICATION Take 1 tablet by mouth daily. Fiber Well  pregabalin  (LYRICA ) 75 MG capsule TAKE 1 CAPSULE TWICE DAILY   QUEtiapine  (SEROQUEL ) 400 MG tablet Take 2 tablets (800 mg total) by mouth at bedtime.   Semaglutide , 2 MG/DOSE, (OZEMPIC , 2 MG/DOSE,) 8 MG/3ML SOPN INJECT 2MG  UNDER THE SKIN ONE TIME WEEKLY AS DIRECTED   venlafaxine  XR (EFFEXOR -XR) 75 MG 24 hr capsule TAKE 3 CAPSULES EVERY DAY   No facility-administered encounter medications on file as of 01/02/2024.    Allergies (verified) Codeine, Compazine [prochlorperazine edisylate], and Sulfa antibiotics   History: Past Medical  History:  Diagnosis Date   ADHD    Allergy    Anemia    past hx    Anxiety    Barrett esophagus    Breast nodule    benign   Cervical arthritis 09/22/2013   Depression    Elevated liver enzymes    Fatty liver    Fibromyalgia    Fibromyalgia    GERD (gastroesophageal reflux disease)    H/O hiatal hernia    Headache(784.0)    migraines   High cholesterol    History of UTI    Lymphocytic colitis    Migraines    Pain    Panic attack    Paraesophageal hiatal hernia    repaired 2009   Spinal stenosis    Thyroid  disease    Thyroid  disease    Vertigo    Past Surgical History:  Procedure Laterality Date   ANKLE SURGERY     CHOLECYSTECTOMY  1980s?   COLONOSCOPY  2010   Buccini   EUS N/A 11/19/2013   Procedure: ESOPHAGEAL ENDOSCOPIC ULTRASOUND (EUS) RADIAL;  Surgeon: Elsie Cree, MD;  Location: [facility];  Service: Endoscopy;  Laterality: N/A;   LAPAROSCOPIC NISSEN FUNDOPLICATION  03/05/2008   LAPAROSCOPIC PARAESOPHAGEAL HERNIA REPAIR  03/05/2008   Dr Sheldon   TUBAL LIGATION  1990s?   UPPER GASTROINTESTINAL ENDOSCOPY  04/05/2018   Norleen kiang    VAGINAL HYSTERECTOMY  03/04/2001   Family History  Problem Relation Age of Onset   Alcohol abuse Other    Elevated Lipids Other    Heart disease Other    Anxiety disorder Mother    Obesity Mother    High Cholesterol Mother    Thyroid  cancer Mother    Depression Mother    Breast cancer Mother    Bone cancer Mother    High Cholesterol Father    Heart disease Father    Alcoholism Father    Colon cancer Maternal Aunt    Colon polyps Sister    Stomach cancer Neg Hx    Esophageal cancer Neg Hx    Rectal cancer Neg Hx    Social History   Socioeconomic History   Marital status: Married    Spouse name: Therapist, nutritional   Number of children: 3   Years of education: Not on file   Highest education level: Not on file  Occupational History   Occupation: disabled  Tobacco Use   Smoking status: Never   Smokeless tobacco: Never   Vaping Use   Vaping status: Never Used  Substance and Sexual Activity   Alcohol use: Not Currently    Comment: wine occassionally   Drug use: No   Sexual activity: Yes    Birth control/protection: None    Comment: n/a partial hysterectomy  Other Topics Concern   Not on file  Social History Narrative   Married    Lived in [place] .  Moved to [place] in early 2000s   She is on disability  for anxiety    Social Drivers of Health   Financial Resource Strain: Low Risk  (01/02/2024)   Overall Financial Resource Strain (CARDIA)    Difficulty of Paying Living Expenses: Not hard at all  Food Insecurity: No Food Insecurity (01/02/2024)   Hunger Vital Sign    Worried About Running Out of Food in the Last Year: Never true    Ran Out of Food in the Last Year: Never true  Transportation Needs: No Transportation Needs (01/02/2024)   PRAPARE - Administrator, Civil Service (Medical): No    Lack of Transportation (Non-Medical): No  Physical Activity: Insufficiently Active (01/02/2024)   Exercise Vital Sign    Days of Exercise per Week: 5 days    Minutes of Exercise per Session: 20 min  Stress: Stress Concern Present (01/02/2024)   [employer] of Occupational Health - Occupational Stress Questionnaire    Feeling of Stress: To some extent  Social Connections: Moderately Isolated (01/02/2024)   Social Connection and Isolation Panel    Frequency of Communication with Friends and Family: More than three times a week    Frequency of Social Gatherings with Friends and Family: More than three times a week    Attends Religious Services: Never    Database administrator or Organizations: No    Attends Engineer, structural: Never    Marital Status: Married    Tobacco Counseling Counseling given: Not Answered    Clinical Intake:  Pre-visit preparation completed: Yes  Pain : No/denies pain     BMI - recorded: 33.39 Nutritional Status: BMI > 30  Obese Nutritional  Risks: None Diabetes: No  Lab Results  Component Value Date   HGBA1C 5.4 08/17/2023   HGBA1C 5.4 04/28/2022   HGBA1C 5.1 01/31/2022     How often do you need to have someone help you when you read instructions, pamphlets, or other written materials from your doctor or pharmacy?: 1 - Never  Interpreter Needed?: No  Information entered by :: Rojelio Blush LPN   Activities of Daily Living     01/02/2024    1:37 PM  In your present state of health, do you have any difficulty performing the following activities:  Hearing? 0  Vision? 0  Difficulty concentrating or making decisions? 0  Walking or climbing stairs? 0  Dressing or bathing? 0  Doing errands, shopping? 0  Preparing Food and eating ? N  Using the Toilet? N  In the past six months, have you accidently leaked urine? N  Do you have problems with loss of bowel control? N  Managing your Medications? N  Managing your Finances? N  Housekeeping or managing your Housekeeping? N    Patient Care Team: Merna Huxley, NP as PCP - General (Family Medicine) Loreli Kins, MD (Family Medicine) Liane Sharyne MATSU, [facility] (Inactive) as Pharmacist (Pharmacist) Abran Norleen SAILOR, MD as Consulting Physician (Gastroenterology) Jeffrie Oneil BROCKS, MD as Consulting Physician (Cardiology)   I have updated your Care Teams any recent Medical Services you may have received from other providers in the past year.     Assessment:   This is a routine wellness examination for Leslie Benson.  Hearing/Vision screen Hearing Screening - Comments:: Denies hearing difficulties   Vision Screening - Comments:: Wears rx glasses - up to date with routine eye exams with  [facility]   Goals Addressed               This Visit's Progress  Increase physical activity (pt-stated)        Get more active.       Depression Screen     01/02/2024    1:35 PM 01/02/2024    1:34 PM 11/15/2023    3:42 PM 02/12/2023    2:27 PM 08/09/2022    3:54 PM  04/28/2022    1:03 PM 12/07/2021    3:11 PM  PHQ 2/9 Scores  PHQ - 2 Score 1 0 0 4 2 3 3   PHQ- 9 Score  0 3 7 6 7 8     Fall Risk     01/02/2024    1:39 PM 11/15/2023    3:42 PM 02/12/2023    2:27 PM 12/07/2021    3:32 PM 11/30/2020    3:32 PM  Fall Risk   Falls in the past year? 1 1 0 1 1  Number falls in past yr: 0 1 0 0 1  Injury with Fall? 0 0 0 0 1  Comment    Scraped knee. No medical attention needed bruises  Risk for fall due to : No Fall Risks  No Fall Risks No Fall Risks Impaired balance/gait;Impaired mobility;History of fall(s)  Follow up Falls evaluation completed Falls evaluation completed Falls evaluation completed  Falls prevention discussed      Data saved with a previous flowsheet row definition    MEDICARE RISK AT HOME:  Medicare Risk at Home Any stairs in or around the home?: No If so, are there any without handrails?: No Home free of loose throw rugs in walkways, pet beds, electrical cords, etc?: Yes Adequate lighting in your home to reduce risk of falls?: Yes Life alert?: No Use of a cane, walker or w/c?: No Grab bars in the bathroom?: Yes Shower chair or bench in shower?: No Elevated toilet seat or a handicapped toilet?: No  TIMED UP AND GO:  Was the test performed?  No  Cognitive Function: 6CIT completed        01/02/2024    1:40 PM 12/07/2021    3:34 PM  6CIT Screen  What Year? 0 points 0 points  What month? 0 points 0 points  What time? 0 points 0 points  Count back from 20 0 points 0 points  Months in reverse 2 points 0 points  Repeat phrase 2 points 0 points  Total Score 4 points 0 points    Immunizations Immunization History  Administered Date(s) Administered   Influenza,inj,Quad PF,6+ Mos 02/24/2017, 01/31/2018, 01/20/2019, 05/17/2020, 05/20/2021   Influenza,inj,Quad PF,6-35 Mos 07/06/2022   Influenza-Unspecified 01/24/2018   Janssen (J&J) SARS-COV-2 Vaccination 07/26/2019   PFIZER Comirnaty(Gray Top)Covid-19 Tri-Sucrose Vaccine  05/20/2021   PFIZER(Purple Top)SARS-COV-2 Vaccination 08/23/2020   PNEUMOCOCCAL CONJUGATE-20 05/20/2021   Respiratory Syncytial Virus Vaccine,Recomb Aduvanted(Arexvy) 07/06/2022   Tdap 04/05/2017, 11/19/2021   Zoster Recombinant(Shingrix) 05/20/2021, 11/19/2021    Screening Tests Health Maintenance  Topic Date Due   Influenza Vaccine  11/23/2023   COVID-19 Vaccine (4 - 2025-26 season) 12/24/2023   Diabetic kidney evaluation - Urine ACR  06/01/2024 (Originally 01/24/1979)   Diabetic kidney evaluation - eGFR measurement  10/16/2024   MAMMOGRAM  12/10/2024   Medicare Annual Wellness (AWV)  01/01/2025   Colonoscopy  04/16/2026   DTaP/Tdap/Td (3 - Td or Tdap) 11/20/2031   Pneumococcal Vaccine: 50+ Years  Completed   Hepatitis C Screening  Completed   HIV Screening  Completed   Zoster Vaccines- Shingrix  Completed   Hepatitis B Vaccines 19-59 Average Risk  Aged Out  HPV VACCINES  Aged Out   Meningococcal B Vaccine  Aged Out   FOOT EXAM  Discontinued   HEMOGLOBIN A1C  Discontinued   OPHTHALMOLOGY EXAM  Discontinued    Health Maintenance Items Addressed:   Additional Screening:  Vision Screening: Recommended annual ophthalmology exams for early detection of glaucoma and other disorders of the eye. Is the patient up to date with their annual eye exam?  Yes  Who is the provider or what is the name of the office in which the patient attends annual eye exams? [facility]  Dental Screening: Recommended annual dental exams for proper oral hygiene  Community Resource Referral / Chronic Care Management: CRR required this visit?  No   CCM required this visit?  No   Plan:    I have personally reviewed and noted the following in the patient's chart:   Medical and social history Use of alcohol, tobacco or illicit drugs  Current medications and supplements including opioid prescriptions. Patient is not currently taking opioid prescriptions. Functional ability and  status Nutritional status Physical activity Advanced directives List of other physicians Hospitalizations, surgeries, and ER visits in previous 12 months Vitals Screenings to include cognitive, depression, and falls Referrals and appointments  In addition, I have reviewed and discussed with patient certain preventive protocols, quality metrics, and best practice recommendations. A written personalized care plan for preventive services as well as general preventive health recommendations were provided to patient.   Rojelio LELON Blush, LPN   0/[id number]/7974   After Visit Summary: (MyChart) Due to this being a telephonic visit, the after visit summary with patients personalized plan was offered to patient via MyChart   Notes: Nothing significant to report at this time.

## 2024-01-08 ENCOUNTER — Ambulatory Visit: Admitting: Adult Health

## 2024-01-11 ENCOUNTER — Other Ambulatory Visit: Payer: Self-pay | Admitting: Adult Health

## 2024-01-11 DIAGNOSIS — G4700 Insomnia, unspecified: Secondary | ICD-10-CM

## 2024-01-11 DIAGNOSIS — F410 Panic disorder [episodic paroxysmal anxiety] without agoraphobia: Secondary | ICD-10-CM

## 2024-01-26 ENCOUNTER — Other Ambulatory Visit: Payer: Self-pay | Admitting: Adult Health

## 2024-01-26 ENCOUNTER — Other Ambulatory Visit: Payer: Self-pay | Admitting: Internal Medicine

## 2024-01-26 DIAGNOSIS — K222 Esophageal obstruction: Secondary | ICD-10-CM

## 2024-01-26 DIAGNOSIS — K219 Gastro-esophageal reflux disease without esophagitis: Secondary | ICD-10-CM

## 2024-01-26 DIAGNOSIS — R1310 Dysphagia, unspecified: Secondary | ICD-10-CM

## 2024-02-09 ENCOUNTER — Other Ambulatory Visit: Payer: Self-pay | Admitting: Adult Health

## 2024-02-09 DIAGNOSIS — F410 Panic disorder [episodic paroxysmal anxiety] without agoraphobia: Secondary | ICD-10-CM

## 2024-02-09 DIAGNOSIS — G4700 Insomnia, unspecified: Secondary | ICD-10-CM

## 2024-02-12 NOTE — Telephone Encounter (Signed)
 Okay for refill?

## 2024-02-18 ENCOUNTER — Encounter: Payer: Self-pay | Admitting: Family Medicine

## 2024-02-18 ENCOUNTER — Ambulatory Visit: Payer: Self-pay

## 2024-02-18 ENCOUNTER — Ambulatory Visit: Admitting: Family Medicine

## 2024-02-18 VITALS — BP 96/60 | HR 95 | Temp 98.5°F | Ht 63.75 in | Wt 196.0 lb

## 2024-02-18 DIAGNOSIS — R339 Retention of urine, unspecified: Secondary | ICD-10-CM | POA: Diagnosis not present

## 2024-02-18 DIAGNOSIS — R300 Dysuria: Secondary | ICD-10-CM

## 2024-02-18 DIAGNOSIS — N3001 Acute cystitis with hematuria: Secondary | ICD-10-CM

## 2024-02-18 LAB — POC URINALSYSI DIPSTICK (AUTOMATED)
Bilirubin, UA: POSITIVE
Blood, UA: POSITIVE
Glucose, UA: NEGATIVE
Ketones, UA: NEGATIVE
Nitrite, UA: POSITIVE
Protein, UA: POSITIVE — AB
Spec Grav, UA: 1.015 (ref 1.010–1.025)
Urobilinogen, UA: 0.2 U/dL
pH, UA: 7 (ref 5.0–8.0)

## 2024-02-18 MED ORDER — AMOXICILLIN-POT CLAVULANATE 875-125 MG PO TABS: 1.0000 | ORAL_TABLET | Freq: Two times a day (BID) | ORAL | 0 refills | Status: AC

## 2024-02-18 NOTE — Progress Notes (Signed)
 Established Patient Office Visit   Subjective  Patient ID: Leslie Benson, female    DOB: April 15, 1961  Age: 63 y.o. MRN: [id number]  Chief Complaint  Patient presents with   Acute Visit    Patient came in today for urine retention, and pain with urination, right side flank pain and abdominal pain, started 3 days ago, patient is taking Azo     Pt is a 63 yo female followed by Darleene Shape, NP and seen for acute urinary concern.  Patient with dysuria, suprapubic pain, right sided back pain, nausea, chills, cloudy, malodorous urine and urinary retention x 3 days.  Patient started Azo over the weekend.  Denies fever, constipation, or emesis.Typically drinks 8 bottles of propel water per day.  Also drinking chocolate milk.  D enies history of renal calculi, changes in soaps, lotions, detergents.    Patient Active Problem List   Diagnosis Date Noted   Coronary artery calcification 06/28/2021   Orthostatic hypotension 12/04/2020   Vertigo 12/02/2020   Dizziness of unknown cause 12/02/2020   GERD (gastroesophageal reflux disease)    Anxiety    Other hyperlipidemia 01/23/2018   Class 1 obesity with serious comorbidity and body mass index (BMI) of 30.0 to 30.9 in adult 01/23/2018   Hypothyroidism 11/25/2014   Adynamic ileus (HCC) 09/22/2013   Obesity (BMI 30-39.9) 09/22/2013   Abdominal pain, epigastric 09/22/2013   Transaminitis 09/22/2013   Cervical arthritis 09/22/2013   Chronic constipation 09/22/2013   Fibromyalgia    Past Medical History:  Diagnosis Date   ADHD    Allergy    Anemia    past hx    Anxiety    Barrett esophagus    Breast nodule    benign   Cervical arthritis 09/22/2013   Depression    Elevated liver enzymes    Fatty liver    Fibromyalgia    Fibromyalgia    GERD (gastroesophageal reflux disease)    H/O hiatal hernia    Headache(784.0)    migraines   High cholesterol    History of UTI    Lymphocytic colitis    Migraines    Pain    Panic attack     Paraesophageal hiatal hernia    repaired 2009   Spinal stenosis    Thyroid  disease    Thyroid  disease    Vertigo    Past Surgical History:  Procedure Laterality Date   ANKLE SURGERY     CHOLECYSTECTOMY  1980s?   COLONOSCOPY  2010   Buccini   EUS N/A 11/19/2013   Procedure: ESOPHAGEAL ENDOSCOPIC ULTRASOUND (EUS) RADIAL;  Surgeon: Elsie Cree, MD;  Location: [facility];  Service: Endoscopy;  Laterality: N/A;   LAPAROSCOPIC NISSEN FUNDOPLICATION  03/05/2008   LAPAROSCOPIC PARAESOPHAGEAL HERNIA REPAIR  03/05/2008   Dr Sheldon   TUBAL LIGATION  1990s?   UPPER GASTROINTESTINAL ENDOSCOPY  04/05/2018   Norleen kiang    VAGINAL HYSTERECTOMY  03/04/2001   Social History   Tobacco Use   Smoking status: Never   Smokeless tobacco: Never  Vaping Use   Vaping status: Never Used  Substance Use Topics   Alcohol use: Not Currently    Comment: wine occassionally   Drug use: No   Family History  Problem Relation Age of Onset   Alcohol abuse Other    Elevated Lipids Other    Heart disease Other    Anxiety disorder Mother    Obesity Mother    High Cholesterol Mother    Thyroid   cancer Mother    Depression Mother    Breast cancer Mother    Bone cancer Mother    High Cholesterol Father    Heart disease Father    Alcoholism Father    Colon cancer Maternal Aunt    Colon polyps Sister    Stomach cancer Neg Hx    Esophageal cancer Neg Hx    Rectal cancer Neg Hx    Allergies  Allergen Reactions   Codeine Nausea And Vomiting   Compazine [Prochlorperazine Edisylate]     feels weird   Sulfa Antibiotics     Unknown reaction.    ROS Negative unless stated above    Objective:     BP 96/60 (BP Location: Left Arm, Patient Position: Sitting, Cuff Size: Large)   Pulse 95   Temp 98.5 F (36.9 C) (Oral)   Ht 5' 3.75 (1.619 m)   Wt 196 lb (88.9 kg)   SpO2 97%   BMI 33.91 kg/m  BP Readings from Last 3 Encounters:  02/18/24 96/60  11/15/23 102/70  10/30/23 100/70   Wt  Readings from Last 3 Encounters:  02/18/24 196 lb (88.9 kg)  01/02/24 193 lb (87.5 kg)  11/15/23 197 lb (89.4 kg)      Physical Exam Constitutional:      General: She is not in acute distress.    Appearance: Normal appearance.  HENT:     Head: Normocephalic and atraumatic.     Nose: Nose normal.     Mouth/Throat:     Mouth: Mucous membranes are moist.  Cardiovascular:     Rate and Rhythm: Normal rate and regular rhythm.     Heart sounds: Normal heart sounds. No murmur heard.    No gallop.  Pulmonary:     Effort: Pulmonary effort is normal. No respiratory distress.     Breath sounds: Normal breath sounds. No wheezing, rhonchi or rales.  Abdominal:     General: Bowel sounds are decreased.     Palpations: Abdomen is soft.     Tenderness: There is abdominal tenderness in the right lower quadrant and suprapubic area. There is right CVA tenderness.  Skin:    General: Skin is warm and dry.  Neurological:     Mental Status: She is alert and oriented to person, place, and time.        01/02/2024    1:35 PM 01/02/2024    1:34 PM 11/15/2023    3:42 PM  Depression screen PHQ 2/9  Decreased Interest 0 0 0  Down, Depressed, Hopeless 1 0 0  PHQ - 2 Score 1 0 0  Altered sleeping  0 0  Tired, decreased energy 0 0 1  Change in appetite 0 0 1  Feeling bad or failure about yourself  0 0 1  Trouble concentrating 0 0 0  Moving slowly or fidgety/restless 0 0 0  Suicidal thoughts 0 0 0  PHQ-9 Score  0 3      02/12/2023    2:27 PM  GAD 7 : Generalized Anxiety Score  Nervous, Anxious, on Edge 2  Control/stop worrying 2  Worry too much - different things 2  Trouble relaxing 2  Restless 0  Easily annoyed or irritable 1  Afraid - awful might happen 1  Total GAD 7 Score 10  Anxiety Difficulty Very difficult     Results for orders placed or performed in visit on 02/18/24  POCT Urinalysis Dipstick (Automated)  Result Value Ref Range   Color, UA dark  yellow    Clarity, UA cloudy     Glucose, UA Negative Negative   Bilirubin, UA pos    Ketones, UA neg    Spec Grav, UA 1.015 1.010 - 1.025   Blood, UA pos    pH, UA 7.0 5.0 - 8.0   Protein, UA Positive (A) Negative   Urobilinogen, UA 0.2 0.2 or 1.0 E.U./dL   Nitrite, UA pos    Leukocytes, UA Large (3+) (A) Negative      Assessment & Plan:   Acute cystitis with hematuria -     Amoxicillin -Pot Clavulanate; Take 1 tablet by mouth 2 (two) times daily for 7 days.  Dispense: 14 tablet; Refill: 0  Dysuria -     POCT Urinalysis Dipstick (Automated)  Urine retention -     POCT Urinalysis Dipstick (Automated)  Acute urinary sx.  POC UA c/w UTI.  Start abx, augmentin  due to concern for possible pyelo given R CVA tenderness.  Sulfa allergy.  Also consider renal calculi.  Given strict precautions.      Return if symptoms worsen or fail to improve.   Clotilda JONELLE Single, MD

## 2024-02-18 NOTE — Addendum Note (Signed)
 Addended by: BRIEN SONG A on: 02/18/2024 03:27 PM   Modules accepted: Orders

## 2024-02-18 NOTE — Telephone Encounter (Signed)
 Appointment made for today 02/18/2024 at 1:30pm with Dr Clotilda Single   FYI Only or Action Required?: FYI only for provider.  Patient was last seen in primary care on 11/15/2023 by Merna Huxley, NP.  Called Nurse Triage reporting Dysuria.  Symptoms began 2-3 DAYS AGO.  Interventions attempted: OTC medications: Tylenol , AZO and Rest, hydration, or home remedies.  Symptoms are: gradually worsening.  Triage Disposition: See HCP Within 4 Hours (Or PCP Triage)  Patient/caregiver understands and will follow disposition?: Yes          Copied from CRM [id number]. Topic: Clinical - Red Word Triage >> Feb 18, 2024  9:45 AM Tinnie BROCKS wrote: Red Word that prompted transfer to Nurse Triage: Thinks she has UTI, severe pain when urinating Reason for Disposition  Side (flank) or lower back pain present  Answer Assessment - Initial Assessment Questions Patient is advised to call us  back if anything changes or with any further questions/concerns. Patient is advised that if anything worsens to go to the Emergency Room. Patient verbalized understanding.   1. SYMPTOM: What's the main symptom you're concerned about? (e.g., frequency, incontinence)     Pain with urination 2. ONSET: When did the  pain with urination  start?     Over the weekend 2-3 days ago 3. PAIN: Is there any pain? If Yes, ask: How bad is it? (Scale: 1-10; mild, moderate, severe)     6 4. CAUSE: What do you think is causing the symptoms?     Possible UTI per patient 5. OTHER SYMPTOMS: Do you have any other symptoms? (e.g., blood in urine, fever, flank pain, pain with urination)     Bladder pressure, lower back pain  Protocols used: Urinary Symptoms-A-AH

## 2024-02-20 LAB — URINE CULTURE
MICRO NUMBER:: 17151451
SPECIMEN QUALITY:: ADEQUATE

## 2024-02-21 ENCOUNTER — Ambulatory Visit: Payer: Self-pay | Admitting: Family Medicine

## 2024-02-25 ENCOUNTER — Encounter: Payer: Self-pay | Admitting: Radiology

## 2024-03-06 ENCOUNTER — Encounter (HOSPITAL_BASED_OUTPATIENT_CLINIC_OR_DEPARTMENT_OTHER): Payer: Self-pay | Admitting: Cardiology

## 2024-03-06 ENCOUNTER — Ambulatory Visit (HOSPITAL_BASED_OUTPATIENT_CLINIC_OR_DEPARTMENT_OTHER): Admitting: Cardiology

## 2024-03-06 VITALS — BP 88/64 | HR 96 | Ht 63.75 in | Wt 197.0 lb

## 2024-03-06 DIAGNOSIS — I2510 Atherosclerotic heart disease of native coronary artery without angina pectoris: Secondary | ICD-10-CM

## 2024-03-06 DIAGNOSIS — I951 Orthostatic hypotension: Secondary | ICD-10-CM | POA: Diagnosis not present

## 2024-03-06 NOTE — Progress Notes (Signed)
 Cardiology Office Note:  .   Date:  03/06/2024  ID:  Leslie Benson, DOB Apr 18, 1961, MRN [id number] PCP: Merna Huxley, NP  [facility] Providers Cardiologist:  None     History of Present Illness: .   Leslie Benson is a 63 y.o. female Discussed the use of AI scribe   History of Present Illness Leslie Benson is a 63 year old female with coronary artery disease who presents for follow-up.  She experiences persistent dizziness, particularly when standing, and her blood pressure is consistently low, with a recent measurement of 96/60 mmHg. She is not on any antihypertensive medications but takes Xanax , Lyrica , Seroquel , and Bentyl .  She has a history of coronary artery disease, with a CT scan in 2021 showing a calcium  score of 5, indicating minimal non-flow limiting coronary disease in the LAD and minimal aortic atherosclerosis. An ECHO in 2021 showed an ejection fraction of 60% with grade one diastolic dysfunction. Her LDL was previously 72.  She has longstanding fatty liver disease and experiences stomach pain. Additionally, she has a hiatal hernia and esophageal stricture and follows up with a gastroenterologist for these conditions.  She reports significant back pain, described as a 'toothache hurt' in the middle of her back, impairing her ability to perform daily activities such as making her bed or walking. She also experiences shoulder and neck pain, attributed to cervical spine problems.  She is attempting to lose weight and has been using Ozempic  but is frustrated with the lack of progress. She drinks Propel daily as part of her weight loss efforts.     No syncope  Studies Reviewed: .        Results LABS LDL: 72  RADIOLOGY CT scan: Calcium  score of 5, 68th percentile, minimal nonflow limiting coronary artery disease in the left anterior descending artery (LAD), minimal aortic atherosclerosis (2021)  DIAGNOSTIC Echocardiogram: Ejection fraction  (EF) 60%, grade 1 diastolic dysfunction (2021) Risk Assessment/Calculations:            Physical Exam:   VS:  BP (!) 88/64   Pulse 96   Ht 5' 3.75 (1.619 m)   Wt 197 lb (89.4 kg)   SpO2 98%   BMI 34.08 kg/m    Wt Readings from Last 3 Encounters:  03/06/24 197 lb (89.4 kg)  02/18/24 196 lb (88.9 kg)  01/02/24 193 lb (87.5 kg)    GEN: Well nourished, well developed in no acute distress NECK: No JVD; No carotid bruits CARDIAC: RRR, no murmurs, no rubs, no gallops RESPIRATORY:  Clear to auscultation without rales, wheezing or rhonchi  ABDOMEN: Soft, non-tender, non-distended EXTREMITIES:  No edema; No deformity   ASSESSMENT AND PLAN: .    Assessment and Plan Assessment & Plan Coronary artery disease with minimal nonobstructive disease Coronary artery disease with minimal nonobstructive disease, as evidenced by a calcium  score of 5 (68th percentile) and minimal nonobstructive disease in the LAD. Echocardiogram in 2021 showed an ejection fraction of 60% with grade one diastolic dysfunction. Current management is satisfactory from a cardiac perspective. - Continue current management with diet and exercise.  Hypotension Chronic hypotension with dizziness and disorientation upon standing. Blood pressure is low, with recent readings lower than previous measurements. No antihypertensive medications are being taken. Potential contributing factors include medications such as Xanax , Lyrica , Seroquel , and Bentyl . Discussed increasing sodium intake and hydration to manage symptoms. - Increase sodium intake. - Ensure adequate hydration. - Consider using liquid IVs or similar products to bolster fluids (  drinks Propel).  Obesity Management is challenging due to lack of weight loss despite medication (Ozempic ) and dietary efforts. Reports frustration with weight loss plateau and plans to discontinue Ozempic  due to ineffectiveness. Lower back pain limits physical activity, complicating weight  management efforts. - Discontinue Ozempic  if ineffective. - Encouraged continued dietary efforts and physical activity as tolerated.  OK to graduate from cardiology clinic.          Signed, Oneil Parchment, MD

## 2024-03-06 NOTE — Patient Instructions (Signed)

## 2024-03-31 ENCOUNTER — Other Ambulatory Visit (HOSPITAL_BASED_OUTPATIENT_CLINIC_OR_DEPARTMENT_OTHER): Payer: Self-pay | Admitting: Cardiology

## 2024-04-07 ENCOUNTER — Ambulatory Visit: Admitting: Adult Health

## 2024-04-07 ENCOUNTER — Encounter: Payer: Self-pay | Admitting: Adult Health

## 2024-04-07 DIAGNOSIS — F331 Major depressive disorder, recurrent, moderate: Secondary | ICD-10-CM | POA: Diagnosis not present

## 2024-04-07 DIAGNOSIS — G4700 Insomnia, unspecified: Secondary | ICD-10-CM | POA: Diagnosis not present

## 2024-04-07 DIAGNOSIS — F410 Panic disorder [episodic paroxysmal anxiety] without agoraphobia: Secondary | ICD-10-CM | POA: Diagnosis not present

## 2024-04-07 DIAGNOSIS — F411 Generalized anxiety disorder: Secondary | ICD-10-CM | POA: Diagnosis not present

## 2024-04-07 MED ORDER — ALPRAZOLAM 1 MG PO TABS: ORAL_TABLET | ORAL | 2 refills | Status: AC

## 2024-04-07 NOTE — Progress Notes (Signed)
 [employer] DEMICCO [id number] 16-Jul-1960 63 y.o.  Subjective:   Patient ID:  Leslie Benson is a 63 y.o. (DOB Apr 11, 1961) female.  Chief Complaint: No chief complaint on file.   HPI Leslie Benson presents to the office today for follow-up of GAD, MDD, panic attacks and insomnia.  Describes mood today as ok. Pleasant. Tearful at times. Mood symptoms - reports some depression and anxiety. Reports varying interest and motivation. Reports irritability at times. Reports panic attacks - hot flashes. Reports some worry, rumination and over thinking. Reports family stressors. Reports chronic pain issues - back and shoulders. Reports mood is variable. Stating I'm taking it one day at a time. Feels like medications are helpful. Taking medications as prescribed.  Energy levels lower. Active, does not have a regular exercise routine.   Enjoys some usual interests and activities. Married. Lives with husband. Has 3 grown daughters - 2 grandchildren. Spending time with family. Talking to sisters. Reports appetite is adequate. Reports weight gain.   Sleeps better some nights than others. Averages 4 to 6 hours with Seroquel .   Reports some difficulties with focus and concentration. Completing tasks. Managing some aspects of household. Receives SSI disability benefits for Fibromyalgia - 20 years.  Denies SI or HI.  Denies AH or VH. Denies self harm. Denies substance use.  Previous medication trials: Unknown   GAD-7    Flowsheet Row Office Visit from 02/12/2023 in [facility] [place]  Total GAD-7 Score 10   PHQ2-9    Flowsheet Row Clinical Support from 01/02/2024 in [facility] [place] Office Visit from 11/15/2023 in [facility] [place] Office Visit from 02/12/2023 in [facility] [place] [place] Office Visit from 08/09/2022 in [facility] [place] Office Visit from 04/28/2022 in  [facility] [place]  PHQ-2 Total Score 1 0 4 2 3   PHQ-9 Total Score 0 3 7 6 7    Flowsheet Row ED from 10/17/2023 in [facility] at [facility] ED from 07/12/2023 in [facility] at [facility] from 06/20/2023 in [facility])  C-SSRS RISK CATEGORY No Risk No Risk No Risk     Review of Systems:  Review of Systems  Musculoskeletal:  Negative for gait problem.  Neurological:  Negative for tremors.  Psychiatric/Behavioral:         Please refer to HPI    Medications: I have reviewed the patient's current medications.  Current Outpatient Medications  Medication Sig Dispense Refill   ALPRAZolam  (XANAX ) 1 MG tablet TAKE 1 TABLET FIVE TIMES DAILY FOR ANXIETY AND PANIC ATTACKS 150 tablet 2   atorvastatin  (LIPITOR) 40 MG tablet TAKE 1 TABLET EVERY DAY 90 tablet 3   dicyclomine  (BENTYL ) 20 MG tablet Take 1 tablet (20 mg total) by mouth every 12 (twelve) hours as needed (for abdominal pain/cramping). 15 tablet 0   ibuprofen  (ADVIL ) 800 MG tablet Take 800 mg by mouth 4 (four) times daily.     levothyroxine  (SYNTHROID ) 25 MCG tablet TAKE 1 TABLET EVERY DAY BEFORE BREAKFAST 90 tablet 3   lidocaine  (LIDODERM ) 5 % Place 1 patch onto the skin daily. Applied to lower back area for 12 hours and remove for 12 hours (Patient not taking: Reported on 03/06/2024) 7 patch 0   meclizine  (ANTIVERT ) 25 MG tablet Take 1 tablet (25 mg total) by mouth 3 (three) times daily as needed for dizziness. 30  tablet 1   Multiple Vitamin (MULTIVITAMIN ADULT PO) Take 1 tablet by mouth daily. (Patient not taking: Reported on 03/06/2024)     naproxen  (NAPROSYN ) 375 MG tablet Take 1 tablet (375 mg total) by mouth 2 (two) times daily as needed (back pain). (Patient not taking: Reported on 03/06/2024) 14 tablet 0   omega-3 acid ethyl esters (LOVAZA ) 1 G capsule Take 1 g by mouth 2 (two) times daily.     omeprazole   (PRILOSEC) 40 MG capsule Take 1 capsule (40 mg total) by mouth daily. Office visit for further refills 90 capsule 0   ondansetron  (ZOFRAN -ODT) 4 MG disintegrating tablet Take 1 tablet (4 mg total) by mouth every 8 (eight) hours as needed for nausea or vomiting. (Patient not taking: Reported on 03/06/2024) 10 tablet 0   OVER THE COUNTER MEDICATION Take 1 tablet by mouth daily. Fiber Well     pregabalin  (LYRICA ) 75 MG capsule TAKE 1 CAPSULE TWICE DAILY 60 capsule 2   QUEtiapine  (SEROQUEL ) 400 MG tablet Take 2 tablets (800 mg total) by mouth at bedtime. 180 tablet 3   Semaglutide , 2 MG/DOSE, (OZEMPIC , 2 MG/DOSE,) 8 MG/3ML SOPN INJECT 2MG  UNDER THE SKIN ONE TIME WEEKLY AS DIRECTED 3 mL 5   venlafaxine  XR (EFFEXOR -XR) 75 MG 24 hr capsule TAKE 3 CAPSULES EVERY DAY 270 capsule 3   No current facility-administered medications for this visit.    Medication Side Effects: None  Allergies: Allergies[1]  Past Medical History:  Diagnosis Date   ADHD    Allergy    Anemia    past hx    Anxiety    Barrett esophagus    Breast nodule    benign   Cervical arthritis 09/22/2013   Depression    Elevated liver enzymes    Fatty liver    Fibromyalgia    Fibromyalgia    GERD (gastroesophageal reflux disease)    H/O hiatal hernia    Headache(784.0)    migraines   High cholesterol    History of UTI    Lymphocytic colitis    Migraines    Pain    Panic attack    Paraesophageal hiatal hernia    repaired 2009   Spinal stenosis    Thyroid  disease    Thyroid  disease    Vertigo     Past Medical History, Surgical history, Social history, and Family history were reviewed and updated as appropriate.   Please see review of systems for further details on the patient's review from today.   Objective:   Physical Exam:  There were no vitals taken for this visit.  Physical Exam Constitutional:      General: She is not in acute distress. Musculoskeletal:        General: No deformity.  Neurological:      Mental Status: She is alert and oriented to person, place, and time.     Coordination: Coordination normal.  Psychiatric:        Attention and Perception: Attention and perception normal. She does not perceive auditory or visual hallucinations.        Mood and Affect: Mood normal. Mood is not anxious or depressed. Affect is not labile, blunt, angry or inappropriate.        Speech: Speech normal.        Behavior: Behavior normal.        Thought Content: Thought content normal. Thought content is not paranoid or delusional. Thought content does not include homicidal or suicidal ideation. Thought content does  not include homicidal or suicidal plan.        Cognition and Memory: Cognition and memory normal.        Judgment: Judgment normal.     Comments: Insight intact     Lab Review:     Component Value Date/Time   NA 138 10/17/2023 2011   NA 141 10/01/2019 1323   K 4.2 10/17/2023 2011   CL 103 10/17/2023 2011   CO2 23 10/17/2023 2011   GLUCOSE 96 10/17/2023 2011   BUN 10 10/17/2023 2011   BUN 10 10/01/2019 1323   CREATININE 0.96 10/17/2023 2011   CALCIUM  9.3 10/17/2023 2011   PROT 7.0 08/17/2023 1205   PROT 7.2 09/25/2018 1125   ALBUMIN 4.4 08/17/2023 1205   ALBUMIN 4.8 09/25/2018 1125   AST 18 08/17/2023 1205   ALT 15 08/17/2023 1205   ALKPHOS 115 08/17/2023 1205   BILITOT 0.4 08/17/2023 1205   BILITOT 0.3 09/25/2018 1125   GFRNONAA >60 10/17/2023 2011   GFRAA 67 10/01/2019 1323       Component Value Date/Time   WBC 5.9 10/17/2023 2011   RBC 4.20 10/17/2023 2011   HGB 12.7 10/17/2023 2011   HCT 38.3 10/17/2023 2011   PLT 221 10/17/2023 2011   MCV 91.2 10/17/2023 2011   MCH 30.2 10/17/2023 2011   MCHC 33.2 10/17/2023 2011   RDW 13.4 10/17/2023 2011   LYMPHSABS 1.2 08/17/2023 1205   MONOABS 0.8 08/17/2023 1205   EOSABS 0.1 08/17/2023 1205   BASOSABS 0.0 08/17/2023 1205    No results found for: POCLITH, LITHIUM   No results found for: PHENYTOIN,  PHENOBARB, VALPROATE, CBMZ   .res Assessment: Plan:    Plan:  PDMP reviewed  1. Xanax  1mg  - 5 times daily - discussed trying to taper, but patient is struggling with increased anxiety. 2. Seroquel  400mg  - 2 at hs  3. Effexor  XR 75mg  TID  RTC 6 months - will call in 3 months for next Xanax  prescription.  25 minutes spent dedicated to the care of this patient on the date of this encounter to include pre-visit review of records, ordering of medication, post visit documentation, and face-to-face time with the patient discussing GAD, MDD, panic attacks and insomnia. Discussed continuing current medication regimen.  Patient advised to contact office with any questions, adverse effects, or acute worsening in signs and symptoms.  Discussed potential benefits, risk, and side effects of benzodiazepines to include potential risk of tolerance and dependence, as well as possible drowsiness.  Advised patient not to drive if experiencing drowsiness and to take lowest possible effective dose to minimize risk of dependence and tolerance.  Discussed potential metabolic side effects associated with atypical antipsychotics, as well as potential risk for movement side effects. Advised pt to contact office if movement side effects occur.   Diagnoses and all orders for this visit:  Panic attacks -     ALPRAZolam  (XANAX ) 1 MG tablet; TAKE 1 TABLET FIVE TIMES DAILY FOR ANXIETY AND PANIC ATTACKS  Insomnia, unspecified type -     ALPRAZolam  (XANAX ) 1 MG tablet; TAKE 1 TABLET FIVE TIMES DAILY FOR ANXIETY AND PANIC ATTACKS  Major depressive disorder, recurrent episode, moderate (HCC)  Generalized anxiety disorder     Please see After Visit Summary for patient specific instructions.  Future Appointments  Date Time Provider Department Center  10/06/2024  4:30 PM Nayleen Janosik Nattalie, NP [facility] None  01/07/2025  1:40 PM [facility]ANNUAL WELLNESS VISIT [facility] [address]    No  orders of the defined  types were placed in this encounter.   -------------------------------    [1]  Allergies Allergen Reactions   Codeine Nausea And Vomiting   Compazine [Prochlorperazine Edisylate]     feels weird   Sulfa Antibiotics     Unknown reaction.

## 2024-05-06 ENCOUNTER — Other Ambulatory Visit: Payer: Self-pay | Admitting: Adult Health

## 2024-05-07 NOTE — Telephone Encounter (Signed)
 Okay for refill?

## 2024-10-06 ENCOUNTER — Ambulatory Visit: Admitting: Adult Health

## 2025-01-07 ENCOUNTER — Ambulatory Visit
